# Patient Record
Sex: Male | Born: 1950
Health system: Southern US, Community
[De-identification: ages and names within clinical notes are randomized; demographics above are authoritative.]

## PROBLEM LIST (undated history)

## (undated) DIAGNOSIS — I10 Essential (primary) hypertension: Secondary | ICD-10-CM

## (undated) DIAGNOSIS — I639 Cerebral infarction, unspecified: Secondary | ICD-10-CM

## (undated) DIAGNOSIS — E119 Type 2 diabetes mellitus without complications: Secondary | ICD-10-CM

## (undated) HISTORY — PX: ANKLE FRACTURE SURGERY: SHX122

---

## 2009-10-05 ENCOUNTER — Emergency Department (HOSPITAL_COMMUNITY): Admission: EM | Admit: 2009-10-05 | Discharge: 2009-10-05 | Payer: Self-pay | Admitting: Family Medicine

## 2009-12-30 ENCOUNTER — Emergency Department (HOSPITAL_COMMUNITY): Admission: EM | Admit: 2009-12-30 | Discharge: 2009-12-30 | Payer: Self-pay | Admitting: Family Medicine

## 2010-06-16 LAB — POCT I-STAT, CHEM 8
BUN: 15 mg/dL (ref 6–23)
Calcium, Ion: 1.23 mmol/L (ref 1.12–1.32)
Chloride: 105 mEq/L (ref 96–112)
Creatinine, Ser: 1.3 mg/dL (ref 0.4–1.5)
Glucose, Bld: 116 mg/dL — ABNORMAL HIGH (ref 70–99)
HCT: 46 % (ref 39.0–52.0)
Hemoglobin: 15.6 g/dL (ref 13.0–17.0)
Potassium: 3.6 mEq/L (ref 3.5–5.1)
Sodium: 140 mEq/L (ref 135–145)
TCO2: 27 mmol/L (ref 0–100)

## 2011-12-30 ENCOUNTER — Emergency Department (HOSPITAL_COMMUNITY): Payer: Non-veteran care

## 2011-12-30 ENCOUNTER — Encounter (HOSPITAL_COMMUNITY): Payer: Self-pay | Admitting: Emergency Medicine

## 2011-12-30 ENCOUNTER — Emergency Department (HOSPITAL_COMMUNITY)
Admission: EM | Admit: 2011-12-30 | Discharge: 2011-12-30 | Disposition: A | Discharge status: Home / Self Care | Payer: Non-veteran care | Attending: Emergency Medicine | Admitting: Emergency Medicine

## 2011-12-30 DIAGNOSIS — G253 Myoclonus: Secondary | ICD-10-CM

## 2011-12-30 DIAGNOSIS — I10 Essential (primary) hypertension: Secondary | ICD-10-CM | POA: Insufficient documentation

## 2011-12-30 DIAGNOSIS — R279 Unspecified lack of coordination: Secondary | ICD-10-CM | POA: Insufficient documentation

## 2011-12-30 DIAGNOSIS — Z79899 Other long term (current) drug therapy: Secondary | ICD-10-CM | POA: Insufficient documentation

## 2011-12-30 HISTORY — DX: Essential (primary) hypertension: I10

## 2011-12-30 LAB — MAGNESIUM: Magnesium: 1.7 mg/dL (ref 1.5–2.5)

## 2011-12-30 LAB — BASIC METABOLIC PANEL
BUN: 10 mg/dL (ref 6–23)
CO2: 29 mEq/L (ref 19–32)
Calcium: 10.8 mg/dL — ABNORMAL HIGH (ref 8.4–10.5)
Chloride: 99 mEq/L (ref 96–112)
Creatinine, Ser: 0.76 mg/dL (ref 0.50–1.35)
GFR calc Af Amer: >90 mL/min (ref >90)
GFR calc non Af Amer: >90 mL/min (ref >90)
Glucose, Bld: 149 mg/dL — ABNORMAL HIGH (ref 70–99)
Potassium: 3.2 mEq/L — ABNORMAL LOW (ref 3.5–5.1)
Sodium: 138 mEq/L (ref 135–145)

## 2011-12-30 LAB — RAPID URINE DRUG SCREEN, HOSP PERFORMED
Amphetamines: NOT DETECTED
Barbiturates: NOT DETECTED
Benzodiazepines: NOT DETECTED
Cocaine: NOT DETECTED
Opiates: NOT DETECTED
Tetrahydrocannabinol: NOT DETECTED

## 2011-12-30 LAB — HEPATIC FUNCTION PANEL
ALT: 79 U/L — ABNORMAL HIGH (ref 0–53)
AST: 88 U/L — ABNORMAL HIGH (ref 0–37)
Albumin: 4.1 g/dL (ref 3.5–5.2)
Alkaline Phosphatase: 52 U/L (ref 39–117)
Bilirubin, Direct: 0.2 mg/dL (ref 0.0–0.3)
Indirect Bilirubin: 0.4 mg/dL (ref 0.3–0.9)
Total Bilirubin: 0.6 mg/dL (ref 0.3–1.2)
Total Protein: 8.4 g/dL — ABNORMAL HIGH (ref 6.0–8.3)

## 2011-12-30 LAB — ETHANOL: Alcohol, Ethyl (B): <11 mg/dL (ref 0–11)

## 2011-12-30 LAB — CBC
HCT: 39.3 % (ref 39.0–52.0)
Hemoglobin: 14 g/dL (ref 13.0–17.0)
MCH: 28.6 pg (ref 26.0–34.0)
MCHC: 35.6 g/dL (ref 30.0–36.0)
MCV: 80.4 fL (ref 78.0–100.0)
Platelets: 150 10*3/uL (ref 150–400)
RBC: 4.89 MIL/uL (ref 4.22–5.81)
RDW: 14.3 % (ref 11.5–15.5)
WBC: 8 10*3/uL (ref 4.0–10.5)

## 2011-12-30 LAB — TSH: TSH: 1.212 u[IU]/mL (ref 0.350–4.500)

## 2011-12-30 MED ORDER — SODIUM CHLORIDE 0.9 % IV BOLUS (SEPSIS)
1000.0000 mL | Freq: Once | INTRAVENOUS | Status: AC
  Administered 2011-12-30: 1000 mL via INTRAVENOUS

## 2011-12-30 MED ORDER — VALPROATE SODIUM 500 MG/5ML IV SOLN
500.0000 mg | INTRAVENOUS | Status: DC
  Filled 2011-12-30: qty 5

## 2011-12-30 MED ORDER — DIAZEPAM 5 MG/ML IJ SOLN
5.0000 mg | Freq: Once | INTRAMUSCULAR | Status: AC
  Administered 2011-12-30: 11:00:00 via INTRAVENOUS
  Filled 2011-12-30: qty 2

## 2011-12-30 MED ORDER — DIAZEPAM 5 MG/ML IJ SOLN
5.0000 mg | Freq: Once | INTRAMUSCULAR | Status: AC
  Administered 2011-12-30: 5 mg via INTRAVENOUS

## 2011-12-30 MED ORDER — POTASSIUM CHLORIDE CRYS ER 20 MEQ PO TBCR
40.0000 meq | EXTENDED_RELEASE_TABLET | Freq: Once | ORAL | Status: AC
  Administered 2011-12-30: 40 meq via ORAL
  Filled 2011-12-30: qty 2

## 2011-12-30 MED ORDER — CLONAZEPAM 0.5 MG PO TABS: 1.0000 mg | ORAL_TABLET | Freq: Three times a day (TID) | ORAL | Status: DC | PRN

## 2011-12-30 MED ORDER — DIPHENHYDRAMINE HCL 50 MG/ML IJ SOLN
25.0000 mg | Freq: Once | INTRAMUSCULAR | Status: AC
  Administered 2011-12-30: 25 mg via INTRAVENOUS
  Filled 2011-12-30: qty 1

## 2011-12-30 MED ORDER — CLONAZEPAM 0.5 MG PO TABS
2.0000 mg | ORAL_TABLET | Freq: Once | ORAL | Status: AC
  Administered 2011-12-30: 2 mg via ORAL
  Filled 2011-12-30: qty 4

## 2011-12-30 NOTE — ED Provider Notes (Signed)
History    61yM with intermittent, uncontrollable jerking. Onset last night. Persistent through out today. No hx of similar. Denies pain anywhere. Did drink liquor last night. Bought from liquor store and significant other with him drank it to and she has not had similar symptoms. No appreciable exacerbating or relieving factors. Denies any other ingestions. No new meds. Drinks regularly. No fever or chills. No acute change in visual acuity.  CSN: [id number]  Arrival date & time 12/30/11  3086   First MD Initiated Contact with Patient 12/30/11 1004      Chief Complaint  Patient presents with  . Shaking    (Consider location/radiation/quality/duration/timing/severity/associated sxs/prior treatment) HPI  Past Medical History  Diagnosis Date  . Hypertension     Past Surgical History  Procedure Date  . Ankle fracture surgery     No family history on file.  History  Substance Use Topics  . Smoking status: Never Smoker   . Smokeless tobacco: Not on file  . Alcohol Use: Yes     beer      Review of Systems   Review of symptoms negative unless otherwise noted in HPI.   Allergies  Latex  Home Medications   Current Outpatient Rx  Name Route Sig Dispense Refill  . AMLODIPINE BESYLATE 2.5 MG PO TABS Oral Take 2.5 mg by mouth daily.    Marland Kitchen HYDROCHLOROTHIAZIDE 25 MG PO TABS Oral Take 25 mg by mouth daily.    . ADULT MULTIVITAMIN W/MINERALS CH Oral Take 1 tablet by mouth daily.    Marland Kitchen VITAMIN B-12 250 MCG PO TABS Oral Take 750 mcg by mouth daily.      BP 163/117  Pulse 96  Temp 98.1 F (36.7 C) (Oral)  Resp 18  SpO2 96%  Physical Exam  Nursing note and vitals reviewed. Constitutional: He is oriented to person, place, and time. He appears well-developed and well-nourished. No distress.  HENT:  Head: Normocephalic and atraumatic.  Eyes: Conjunctivae normal and EOM are normal. Pupils are equal, round, and reactive to light. Right eye exhibits no discharge. Left eye  exhibits no discharge.  Neck: Normal range of motion. Neck supple.  Cardiovascular: Normal rate, regular rhythm and normal heart sounds.  Exam reveals no gallop and no friction rub.   No murmur heard. Pulmonary/Chest: Effort normal and breath sounds normal. No respiratory distress.  Abdominal: Soft. He exhibits no distension. There is no tenderness.  Musculoskeletal: He exhibits no edema and no tenderness.  Neurological: He is alert and oriented to person, place, and time. He has normal reflexes. No cranial nerve deficit. Coordination abnormal.       Occasionally jerking movement at rest. Worse with movement. B/l dysmetria with finger to nose. Gait unsteady.  Biceps and patellar reflexes 1+. Strength 5/5 b/l u/l extremities.  Skin: Skin is warm and dry.  Psychiatric: He has a normal mood and affect. His behavior is normal. Thought content normal.    ED Course  Procedures (including critical care time)  Labs Reviewed  BASIC METABOLIC PANEL - Abnormal; Notable for the following:    Potassium 3.2 (*)     Glucose, Bld 149 (*)     Calcium 10.8 (*)     All other components within normal limits  HEPATIC FUNCTION PANEL - Abnormal; Notable for the following:    Total Protein 8.4 (*)     AST 88 (*)     ALT 79 (*)     All other components within normal limits  MAGNESIUM  ETHANOL  CBC  TSH   Ct Head Wo Contrast  12/30/2011  *RADIOLOGY REPORT*  Clinical Data: Unusual alcohol intake yesterday, now with tremors.  CT HEAD WITHOUT CONTRAST  Technique:  Contiguous axial images were obtained from the base of the skull through the vertex without contrast.  Comparison: None.  Findings: There is no evidence for acute infarction, intracranial hemorrhage, mass lesion, hydrocephalus, or extra-axial fluid. Suspect mild cerebellar greater than cerebral atrophy.  Mild chronic microvascular ischemic change in the periventricular subcortical white matter.  Calvarium intact.  Mild vascular calcification.  No acute  sinus or mastoid disease.  IMPRESSION: Mild atrophy.  No acute intracranial findings.   Original Report Authenticated By: [name], M.D.      1. Myoclonic jerking       MDM  61 year old male with acute onset of myoclonic jerking. Patient has is at rest but is more pronounced with trying to maintain posture and with movements. No change in mental status. Consider metabolic, tox, infectious, stroke or other central lesion, epilepsy, hypoxic injury, etc. Given pt's history, metabolic or toxicologic etiology seems most likely. Will check labs. Possible imaging and/or neurology consult.  3:21 PM Pt evaluated by neurology. Recommending clonazepam and dose of benadryl. Pt symptoms significantly improved. DC'd with script for klonopin and neurology referral provided.        Raeford Razor, MD 12/30/11 [phone]

## 2011-12-30 NOTE — ED Notes (Addendum)
Pt reports drank corn liquor yesterday and began with shaking last night. Pt talking in complete sentences without difficulty. Pt reports feels nervous. Pt reports never drank corn liquor before only drinks beer every once and awhile.

## 2011-12-30 NOTE — ED Notes (Signed)
Dr. Kohut at pt bedside 

## 2011-12-30 NOTE — Consult Note (Signed)
Reason for Consult: Mycolonus Referring Physician: : Dr. Raeford Razor  - ED  CC:  Myoclonus HPI: This is a 61 y/o RHAAM with significant history of alcohol abuse, comes with acute onset of myoclonic activity without loss of consciousness, no seizure like activity, there is no cognitive decline and or change in mental status, no ataxia, no falls.  When distracted patient has no myoclonus, or sometimes minimal but when gets anxious gets worse. No family history of myoclonus, this has never occurred prior, no resting tremor, no other neurodegenerative disorders, no abnormal sleep cycle, has history of STD  About 30 years ago and was treated adequately, but do not recall the name of the organism or what kind of STD, no features of Visual hallucinations or audio hallucinations, no rigidity, no spasticity, its acute onset there is no childhood disorder, he is not on SSRI, or other medications. Patient is awake, alert and gives complete history, he does not get intermittently confused or disoriented. No neck titubation was noticed, no trauma, no history of thyroid disease, renal disease, or seizures. There is no change in the frequency or rhythm of myoclonic activity.  Past Medical History  Diagnosis Date  . Hypertension     Past Surgical History  Procedure Date  . Ankle fracture surgery     No family history on file.  Social History:  reports that he has never smoked. He does not have any smokeless tobacco history on file. He reports that he drinks alcohol. He reports that he does not use illicit drugs.  Allergies  Allergen Reactions  . Latex Itching    Medications: Scheduled:    ROS: All 12 point review of systems were reviewed and pertinent are  mentioned in the HPI   Physical Examination: Blood pressure 161/99, pulse 83, temperature 98.1 F (36.7 C), temperature source Oral, resp. rate 16, SpO2 96.00%.  Physical Exam: General: AAO follows commands HEENT: EOMI, No nystagmus, no  ptosis, no lid lag, no neglect, no convergence or divergence palsy NEck: Supple, no JVD, no carotid bruit Lungs: CTA Heart; RRR, no murmur, no rubs and or gallops Abdomen: bowel sounds present Skin; no rash Extremity: no edema   Neurologic Examination Mental Status: Alert, oriented, thought content appropriate.  Speech fluent without evidence of aphasia.  Able to follow 3 step commands without difficulty. Cranial Nerves: II: visual fields grossly normal, pupils equal, round, reactive to light and accommodation III,IV, VI: ptosis not present, extra-ocular motions intact bilaterally V,VII: smile symmetric, facial light touch sensation normal bilaterally VIII: hearing normal bilaterally IX,X: gag reflex present XI: trapezius strength/neck flexion strength normal bilaterally XII: tongue strength normal  Motor: Right : Upper extremity   5/5    Left:     Upper extremity   5/5  Lower extremity   5/5     Lower extremity   5/5 Tone and bulk:normal tone throughout; no atrophy noted Sensory: Pinprick and light touch intact throughout, bilaterally Deep Tendon Reflexes: 2+ and symmetric throughout Plantars: Right: downgoing   Left: downgoing Cerebellar:  because of myoclonus unable to do     Laboratory Studies:   Basic Metabolic Panel:  Lab 12/30/11 5784  NA 138  K 3.2*  CL 99  CO2 29  GLUCOSE 149*  BUN 10  CREATININE 0.76  CALCIUM 10.8*  MG 1.7  PHOS --    Liver Function Tests:  Lab 12/30/11 1032  AST 88*  ALT 79*  ALKPHOS 52  BILITOT 0.6  PROT 8.4*  ALBUMIN 4.1  No results found for this basename: LIPASE:5,AMYLASE:5 in the last 168 hours No results found for this basename: AMMONIA:3 in the last 168 hours  CBC:  Lab 12/30/11 1155  WBC 8.0  NEUTROABS --  HGB 14.0  HCT 39.3  MCV 80.4  PLT 150    Cardiac Enzymes: No results found for this basename: CKTOTAL:5,CKMB:5,CKMBINDEX:5,TROPONINI:5 in the last 168 hours  BNP: No components found with this  basename: POCBNP:5  CBG: No results found for this basename: GLUCAP:5 in the last 168 hours  Microbiology: No results found for this or any previous visit.  Coagulation Studies: No results found for this basename: LABPROT:5,INR:5 in the last 72 hours  Urinalysis: No results found for this basename: COLORURINE:2,APPERANCEUR:2,LABSPEC:2,PHURINE:2,GLUCOSEU:2,HGBUR:2,BILIRUBINUR:2,KETONESUR:2,PROTEINUR:2,UROBILINOGEN:2,NITRITE:2,LEUKOCYTESUR:2 in the last 168 hours  Lipid Panel:  No results found for this basename: chol, trig, hdl, cholhdl, vldl, ldlcalc    HgbA1C:  No results found for this basename: HGBA1C    Urine Drug Screen:   No results found for this basename: labopia, cocainscrnur, labbenz, amphetmu, thcu, labbarb    Alcohol Level:  Lab 12/30/11 1032  ETH <11    Other results: EKG: normal EKG, normal sinus rhythm, unchanged from previous tracings.  Imaging: Ct Head Wo Contrast  12/30/2011  *RADIOLOGY REPORT*  Clinical Data: Unusual alcohol intake yesterday, now with tremors.  CT HEAD WITHOUT CONTRAST  Technique:  Contiguous axial images were obtained from the base of the skull through the vertex without contrast.  Comparison: None.  Findings: There is no evidence for acute infarction, intracranial hemorrhage, mass lesion, hydrocephalus, or extra-axial fluid. Suspect mild cerebellar greater than cerebral atrophy.  Mild chronic microvascular ischemic change in the periventricular subcortical white matter.  Calvarium intact.  Mild vascular calcification.  No acute sinus or mastoid disease.  IMPRESSION: Mild atrophy.  No acute intracranial findings.   Original Report Authenticated By: [name], M.D.      Assessment/Plan:  Patient with alcohol abuse, had enough to drink last night , but urine screen this morning shows <11. It is difficult to differentiate organic vs physiological and or psychological myoclonic activity. This is less likely secondary to seizures. It may  hint more towards alcohol toxicity and or withdrawal  And or other metabolic disorders. Patient has no other neurological deficits.   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

## 2013-09-05 IMAGING — CT CT HEAD W/O CM
1 series · 16 of 30 positions shown, 20 images · non-contrast
Comparison: None.

CLINICAL DATA: Unusual alcohol intake yesterday, now with tremors.

CT HEAD WITHOUT CONTRAST
TECHNIQUE: Contiguous axial images were obtained from the base of
the skull through the vertex without contrast.

[Series 2: head routine 4.8 h37s · axial · 0.43mm/px · z∈[-156,+4]mm · 16 of 36 slices shown, 20 images]
[im 2/36  brain]
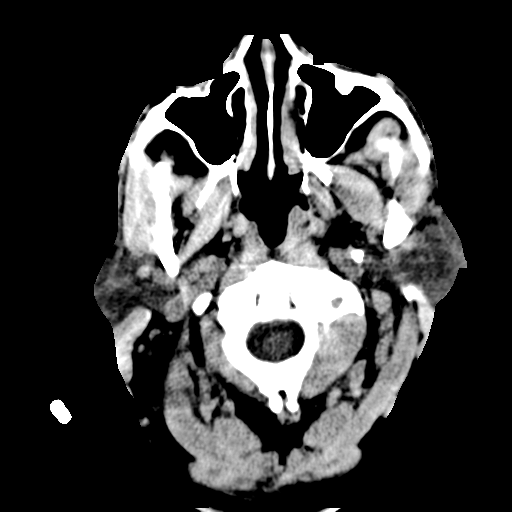
[im 2/36  bone]
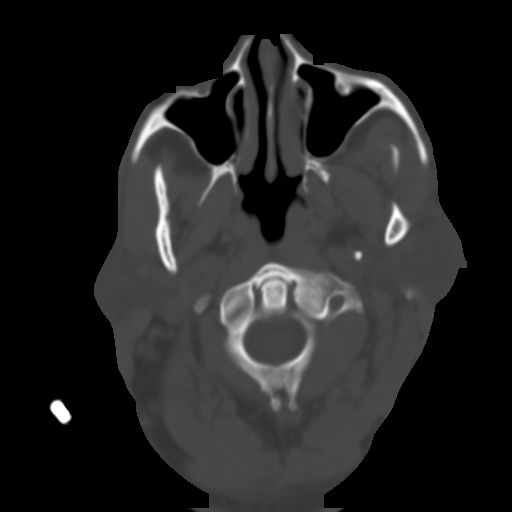
[im 4/36  brain]
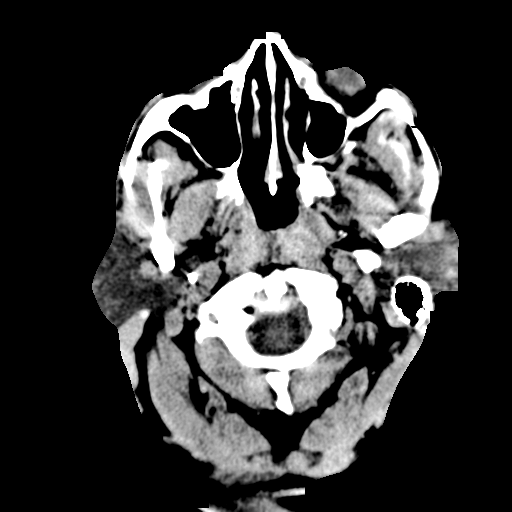
[im 7/36  brain]
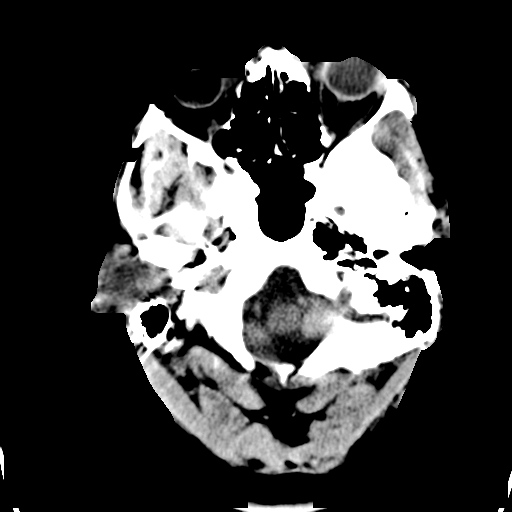
[im 9/36  brain]
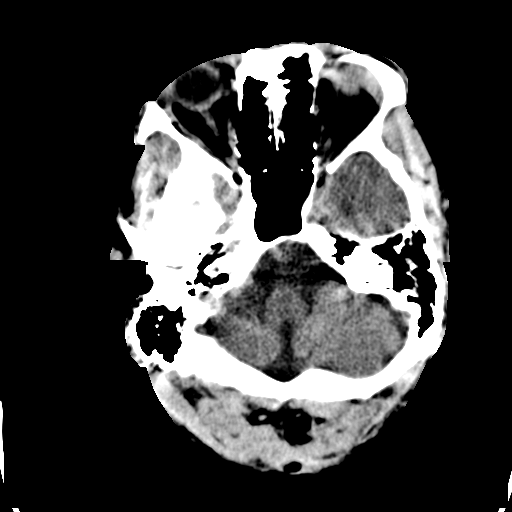
[im 10/36  brain]
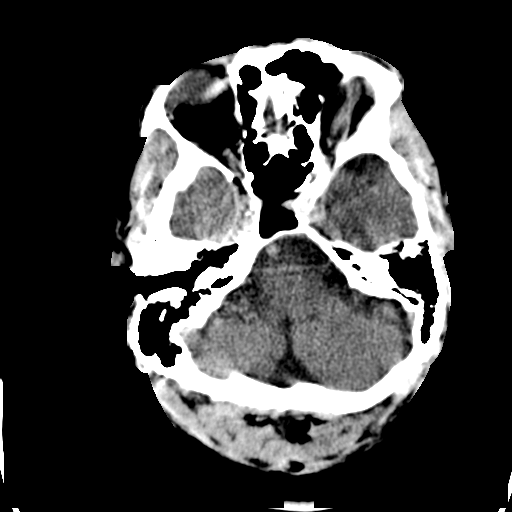
[im 10/36  bone]
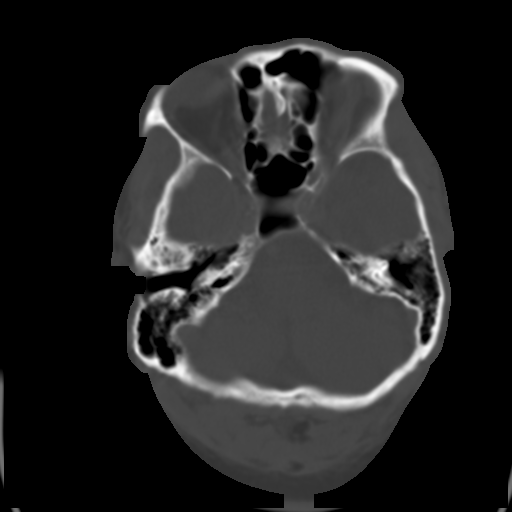
[im 13/36  brain]
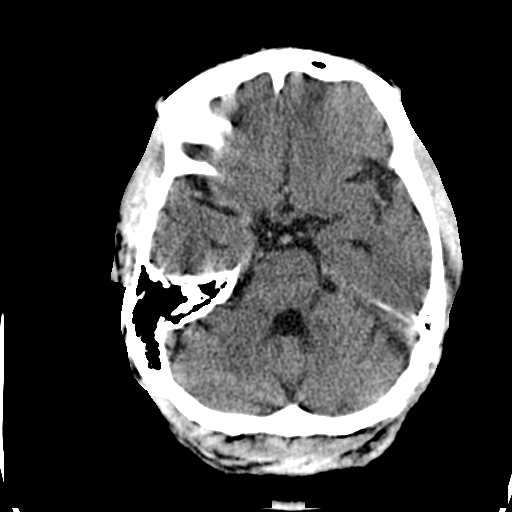
[im 15/36  brain]
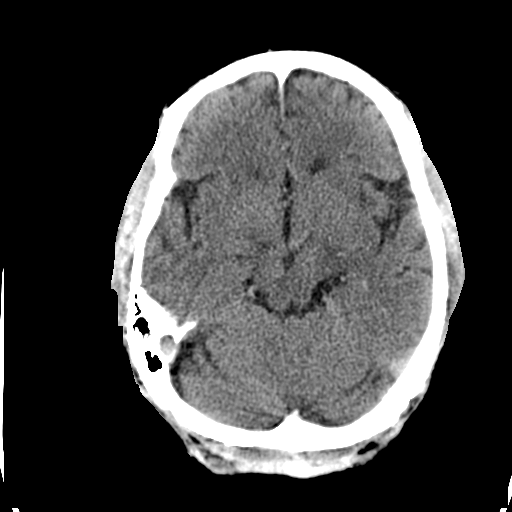
[im 17/36  brain]
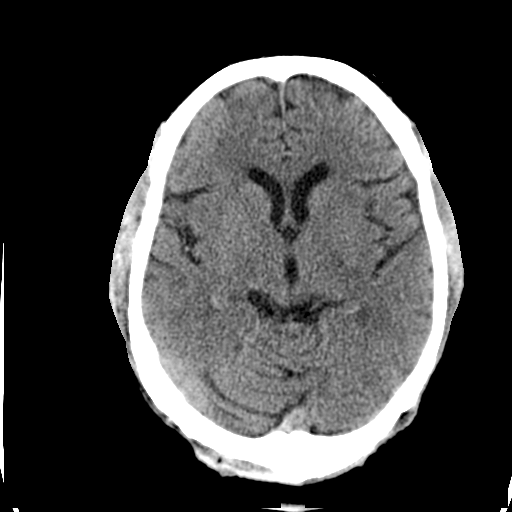
[im 19/36  brain]
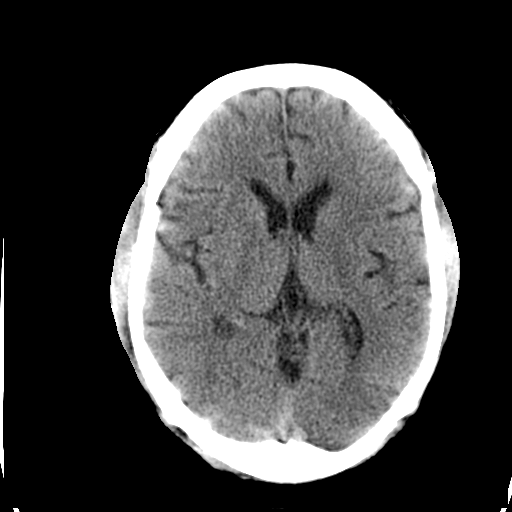
[im 19/36  bone]
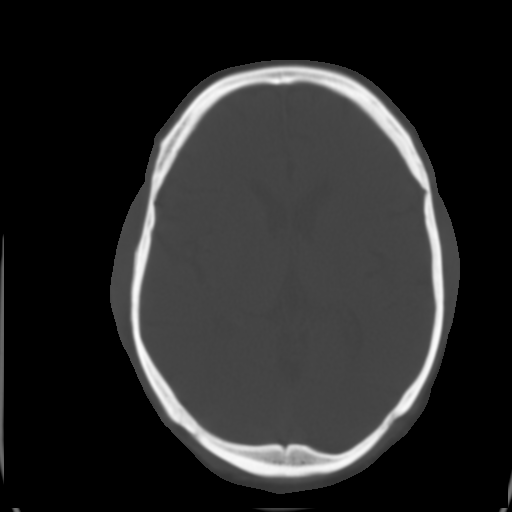
[im 21/36  brain]
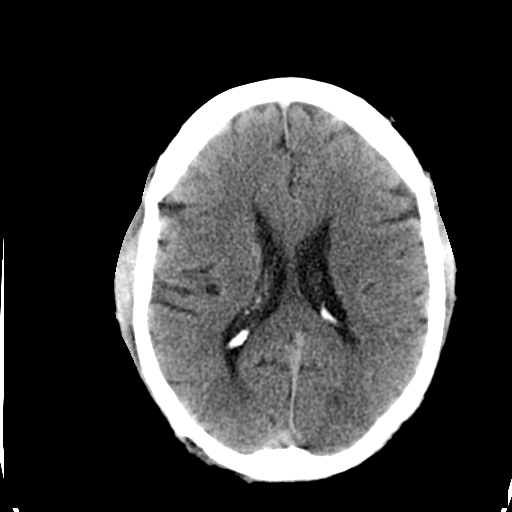
[im 23/36  brain]
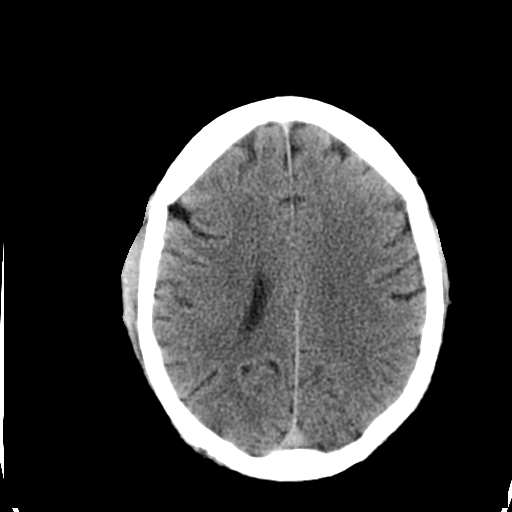
[im 26/36  brain]
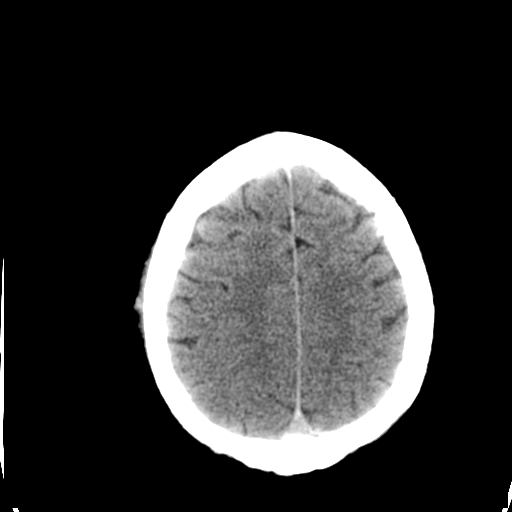
[im 27/36  brain]
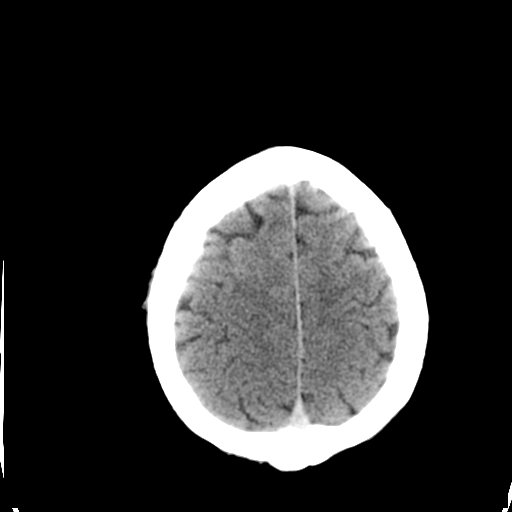
[im 27/36  bone]
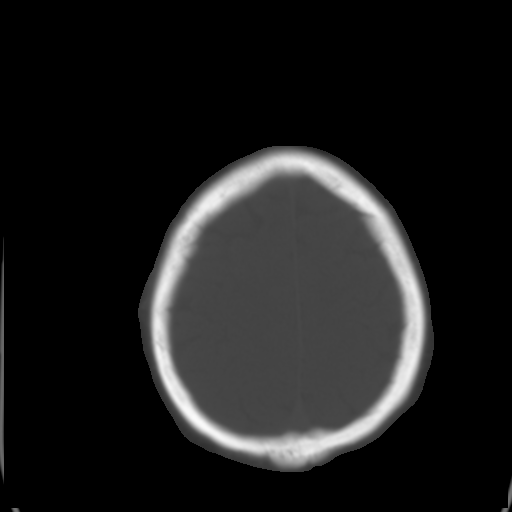
[im 29/36  brain]
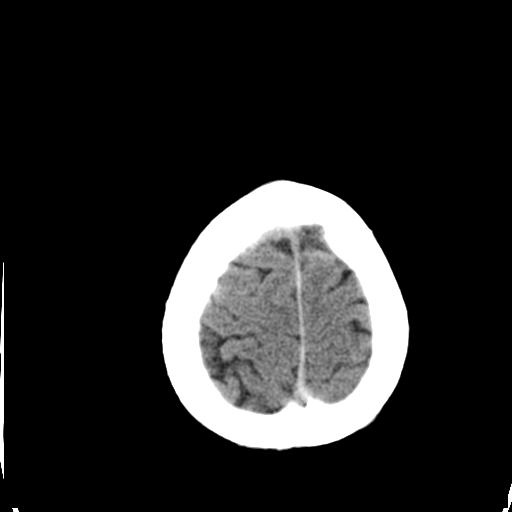
[im 32/36  brain]
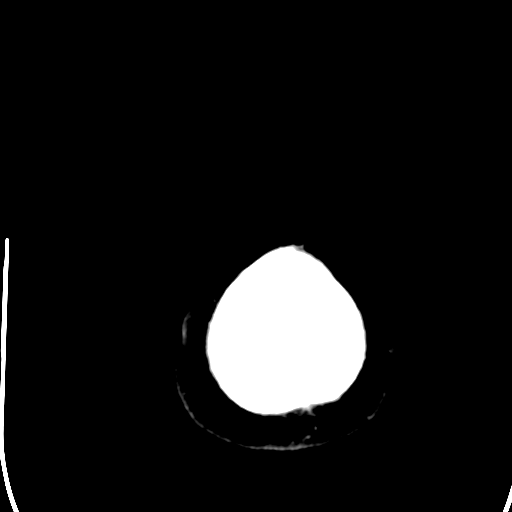
[im 34/36  brain]
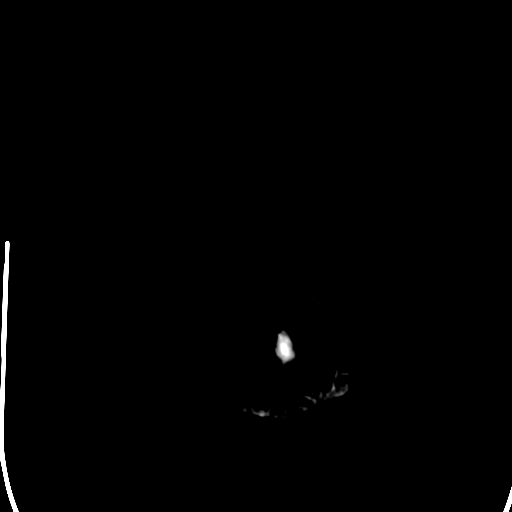

[16 of 30 positions shown; findings below may reference images not displayed]

FINDINGS: There is no evidence for acute infarction, intracranial
hemorrhage, mass lesion, hydrocephalus, or extra-axial fluid.
Suspect mild cerebellar greater than cerebral atrophy.  Mild
chronic microvascular ischemic change in the periventricular
subcortical white matter.  Calvarium intact.  Mild vascular
calcification.  No acute sinus or mastoid disease.
IMPRESSION: Mild atrophy.  No acute intracranial findings.

## 2017-04-06 ENCOUNTER — Encounter: Payer: Self-pay | Admitting: Internal Medicine

## 2017-10-03 DIAGNOSIS — Z0100 Encounter for examination of eyes and vision without abnormal findings: Secondary | ICD-10-CM | POA: Diagnosis not present

## 2017-10-03 DIAGNOSIS — H524 Presbyopia: Secondary | ICD-10-CM | POA: Diagnosis not present

## 2017-11-16 ENCOUNTER — Ambulatory Visit: Payer: Non-veteran care | Admitting: Family Medicine

## 2017-11-23 ENCOUNTER — Ambulatory Visit: Payer: Medicare HMO | Attending: Internal Medicine | Admitting: Internal Medicine

## 2017-11-23 ENCOUNTER — Encounter: Payer: Self-pay | Admitting: Internal Medicine

## 2017-11-23 VITALS — BP 157/91 | HR 77 | Temp 98.2°F | Resp 16 | Ht 72.0 in | Wt 343.0 lb

## 2017-11-23 DIAGNOSIS — M1711 Unilateral primary osteoarthritis, right knee: Secondary | ICD-10-CM

## 2017-11-23 DIAGNOSIS — Z6841 Body Mass Index (BMI) 40.0 and over, adult: Secondary | ICD-10-CM

## 2017-11-23 DIAGNOSIS — H259 Unspecified age-related cataract: Secondary | ICD-10-CM | POA: Diagnosis not present

## 2017-11-23 DIAGNOSIS — B1920 Unspecified viral hepatitis C without hepatic coma: Secondary | ICD-10-CM | POA: Diagnosis not present

## 2017-11-23 DIAGNOSIS — E119 Type 2 diabetes mellitus without complications: Secondary | ICD-10-CM

## 2017-11-23 DIAGNOSIS — I10 Essential (primary) hypertension: Secondary | ICD-10-CM | POA: Diagnosis not present

## 2017-11-23 DIAGNOSIS — F1190 Opioid use, unspecified, uncomplicated: Secondary | ICD-10-CM

## 2017-11-23 DIAGNOSIS — Z79899 Other long term (current) drug therapy: Secondary | ICD-10-CM | POA: Insufficient documentation

## 2017-11-23 DIAGNOSIS — Z8619 Personal history of other infectious and parasitic diseases: Secondary | ICD-10-CM | POA: Diagnosis not present

## 2017-11-23 DIAGNOSIS — Z23 Encounter for immunization: Secondary | ICD-10-CM | POA: Diagnosis not present

## 2017-11-23 DIAGNOSIS — Z7984 Long term (current) use of oral hypoglycemic drugs: Secondary | ICD-10-CM | POA: Insufficient documentation

## 2017-11-23 DIAGNOSIS — Z125 Encounter for screening for malignant neoplasm of prostate: Secondary | ICD-10-CM | POA: Diagnosis not present

## 2017-11-23 DIAGNOSIS — Z79891 Long term (current) use of opiate analgesic: Secondary | ICD-10-CM | POA: Insufficient documentation

## 2017-11-23 LAB — POCT GLYCOSYLATED HEMOGLOBIN (HGB A1C): HbA1c, POC (controlled diabetic range): 7 % (ref 0.0–7.0)

## 2017-11-23 LAB — GLUCOSE, POCT (MANUAL RESULT ENTRY): POC Glucose: 189 mg/dl — AB (ref 70–99)

## 2017-11-23 MED ORDER — TRAMADOL HCL 50 MG PO TABS: 50.0000 mg | ORAL_TABLET | Freq: Two times a day (BID) | ORAL | 0 refills | Status: DC | PRN

## 2017-11-23 MED ORDER — DICLOFENAC SODIUM 1 % TD GEL: 2.0000 g | Freq: Four times a day (QID) | TRANSDERMAL | 3 refills | Status: DC

## 2017-11-23 MED ORDER — LOSARTAN POTASSIUM 100 MG PO TABS: 100.0000 mg | ORAL_TABLET | Freq: Every day | ORAL | 1 refills | Status: DC

## 2017-11-23 MED ORDER — HYDRALAZINE HCL 10 MG PO TABS: 10.0000 mg | ORAL_TABLET | Freq: Two times a day (BID) | ORAL | 1 refills | Status: DC

## 2017-11-23 MED ORDER — AMLODIPINE BESYLATE 10 MG PO TABS: 10.0000 mg | ORAL_TABLET | Freq: Every day | ORAL | 1 refills | Status: DC

## 2017-11-23 MED ORDER — METFORMIN HCL 1000 MG PO TABS: 1000.0000 mg | ORAL_TABLET | Freq: Two times a day (BID) | ORAL | 1 refills | Status: DC

## 2017-11-23 NOTE — Patient Instructions (Addendum)
Please sign a release at the checkout counter for [place] to get your records from the [place].  Your blood pressure is not at goal of 130/80 or lower.  I have recommended adding a medication called hydralazine 10 mg twice a day.  Continue amlodipine, Losartan and chlorthalidone.  Start using the Voltaren gel on your knee to help with arthritis pain.  This is an anti-inflammatory gel. We have decreased tramadol to twice a day as needed.   Influenza Virus Vaccine injection (Fluarix) What is this medicine? INFLUENZA VIRUS VACCINE (in floo EN zuh VAHY ruhs vak SEEN) helps to reduce the risk of getting influenza also known as the flu. This medicine may be used for other purposes; ask your health care provider or pharmacist if you have questions. COMMON BRAND NAME(S): Fluarix, Fluzone What should I tell my health care provider before I take this medicine? They need to know if you have any of these conditions: -bleeding disorder like hemophilia -fever or infection -Guillain-Barre syndrome or other neurological problems -immune system problems -infection with the human immunodeficiency virus (HIV) or AIDS -low blood platelet counts -multiple sclerosis -an unusual or allergic reaction to influenza virus vaccine, eggs, chicken proteins, latex, gentamicin, other medicines, foods, dyes or preservatives -pregnant or trying to get pregnant -breast-feeding How should I use this medicine? This vaccine is for injection into a muscle. It is given by a health care professional. A copy of Vaccine Information Statements will be given before each vaccination. Read this sheet carefully each time. The sheet may change frequently. Talk to your pediatrician regarding the use of this medicine in children. Special care may be needed. Overdosage: If you think you have taken too much of this medicine contact a poison control center or emergency room at once. NOTE: This medicine is only for you. Do not share this medicine  with others. What if I miss a dose? This does not apply. What may interact with this medicine? -chemotherapy or radiation therapy -medicines that lower your immune system like etanercept, anakinra, infliximab, and adalimumab -medicines that treat or prevent blood clots like warfarin -phenytoin -steroid medicines like prednisone or cortisone -theophylline -vaccines This list may not describe all possible interactions. Give your health care provider a list of all the medicines, herbs, non-prescription drugs, or dietary supplements you use. Also tell them if you smoke, drink alcohol, or use illegal drugs. Some items may interact with your medicine. What should I watch for while using this medicine? Report any side effects that do not go away within 3 days to your doctor or health care professional. Call your health care provider if any unusual symptoms occur within 6 weeks of receiving this vaccine. You may still catch the flu, but the illness is not usually as bad. You cannot get the flu from the vaccine. The vaccine will not protect against colds or other illnesses that may cause fever. The vaccine is needed every year. What side effects may I notice from receiving this medicine? Side effects that you should report to your doctor or health care professional as soon as possible: -allergic reactions like skin rash, itching or hives, swelling of the face, lips, or tongue Side effects that usually do not require medical attention (report to your doctor or health care professional if they continue or are bothersome): -fever -headache -muscle aches and pains -pain, tenderness, redness, or swelling at site where injected -weak or tired This list may not describe all possible side effects. Call your doctor for medical advice  about side effects. You may report side effects to FDA at 1-800-FDA-1088. Where should I keep my medicine? This vaccine is only given in a clinic, pharmacy, doctor's office, or  other health care setting and will not be stored at home. NOTE: This sheet is a summary. It may not cover all possible information. If you have questions about this medicine, talk to your doctor, pharmacist, or health care provider.  2018 [employer]/Gold Standard (2007-10-16 09:30:40)

## 2017-11-23 NOTE — Progress Notes (Signed)
Patient ID: Austin Mercer, male    DOB: 12/31/1950  MRN: [id number]  CC: New Patient (Initial Visit)   Subjective: Austin Mercer is a 67 y.o. male who presents for new pt visit. Previous PCP was at the [facility].  Last seen last mth His concerns today include:   Pt gives hx of severe RT OA of knee, lower back pain x 1 yr (had x-ray that showed arthritis), HTN, DM, hep C (treated at [facility], cured), ED.  DM:  Needs test stripes and lancets Taking Metformin consistently Eating Habits: "I have slowed down on eating."  He tries to walk QOD if he goes to the store.  Has a peddling machine which he plans to start using.   Last eye exam was 1 mth ago. Cataract on Rt eye No numbness in feet   HTN:  Reports compliance with meds and salt restriction.  Current meds include amlodipine, chlorthalidone and Cozaar.  He has taken medicines already for today.  Denies any chest pains or shortness of breath at rest on exertion.  Rt knee pain:  Takes Tramadol TID; knee bothersome with walking and in rainy weather.  When he runs out of tramadol, he takes Aleve and feels that this works better for him.  He is not sure if he has any kidney dysfunction.  Drinks about 5 12 oz beer a wk and shot of Christiane HaJack Daniels about 3 times a week  Current Outpatient Medications on File Prior to Visit  Medication Sig Dispense Refill  . amLODipine (NORVASC) 10 MG tablet Take 10 mg by mouth daily.    . chlorthalidone (HYGROTON) 50 MG tablet Take by mouth daily.    Marland Kitchen. losartan (COZAAR) 100 MG tablet Take 100 mg by mouth daily.    . metFORMIN (GLUCOPHAGE) 1000 MG tablet Take 1,000 mg by mouth 2 (two) times daily with a meal.    . tadalafil (CIALIS) 5 MG tablet Take 5 mg by mouth daily as needed for erectile dysfunction.    . traMADol (ULTRAM) 50 MG tablet Take by mouth 3 (three) times daily.    . clonazePAM (KLONOPIN) 0.5 MG tablet Take 2 tablets (1 mg total) by mouth 3 (three) times daily as needed (myclonic jerking). (Patient not taking:  Reported on 11/23/2017) 30 tablet 0  . Multiple Vitamin (MULTIVITAMIN WITH MINERALS) TABS Take 1 tablet by mouth daily.    . vitamin B-12 (CYANOCOBALAMIN) 250 MCG tablet Take 750 mcg by mouth daily.     No current facility-administered medications on file prior to visit.     Allergies  Allergen Reactions  . Latex Itching    Social History   Socioeconomic History  . Marital status: Divorced    Spouse name: Not on file  . Number of children: Not on file  . Years of education: Not on file  . Highest education level: Not on file  Occupational History  . Not on file  Social Needs  . Financial resource strain: Not on file  . Food insecurity:    Worry: Not on file    Inability: Not on file  . Transportation needs:    Medical: Not on file    Non-medical: Not on file  Tobacco Use  . Smoking status: Never Smoker  Substance and Sexual Activity  . Alcohol use: Yes    Comment: beer  . Drug use: No  . Sexual activity: Not on file  Lifestyle  . Physical activity:    Days per week: Not on file  Minutes per session: Not on file  . Stress: Not on file  Relationships  . Social connections:    Talks on phone: Not on file    Gets together: Not on file    Attends religious service: Not on file    Active member of club or organization: Not on file    Attends meetings of clubs or organizations: Not on file    Relationship status: Not on file  . Intimate partner violence:    Fear of current or ex partner: Not on file    Emotionally abused: Not on file    Physically abused: Not on file    Forced sexual activity: Not on file  Other Topics Concern  . Not on file  Social History Narrative  . Not on file    No family history on file.  Past Surgical History:  Procedure Laterality Date  . ANKLE FRACTURE SURGERY      ROS: Review of Systems  Constitutional: Negative for activity change and appetite change.  Eyes: Negative for visual disturbance.  Respiratory: Negative for chest  tightness and shortness of breath.   Cardiovascular: Negative for chest pain, palpitations and leg swelling.  Endocrine: Negative for polydipsia and polyuria.    PHYSICAL EXAM: BP (!) 157/91   Pulse 77   Temp 98.2 F (36.8 C) (Oral)   Resp 16   Ht 6' (1.829 m)   Wt (!) 343 lb (155.6 kg)   SpO2 97%   BMI 46.52 kg/m   Physical Exam  General appearance - alert, well appearing, older African-American male and in no distress Mental status -patient is very talkative.  He answers questions appropriately.  He swears constantly. Eyes - pupils equal and reactive, extraocular eye movements intact Nose - normal and patent, no erythema, discharge or polyps Mouth - mucous membranes moist, pharynx normal without lesions Neck - supple, no significant adenopathy Chest - clear to auscultation, no wheezes, rales or rhonchi, symmetric air entry Heart - normal rate, regular rhythm, normal S1, S2, no murmurs, rubs, clicks or gallops Breasts: Mild to moderate gynecomastia.  No lumps palpated in the breast. Abdomen -obese, normal bowel sounds, soft nontender and no organomegaly. Musculoskeletal -right knee: Large body habitus.  Mild joint enlargement.  No point tenderness.  Mild crepitus on passive movement.   Extremities - peripheral pulses normal, no pedal edema, no clubbing or cyanosis  BS 189/A1C 7.0 ASSESSMENT AND PLAN: 1. Controlled type 2 diabetes mellitus without complication, without long-term current use of insulin (HCC) Patient close to goal of having A1c less than 7. Discussed the importance of healthy eating habits, regular aerobic exercise (at least 150 minutes a week as tolerated) and medication compliance to achieve or maintain control of diabetes. Continue current dose of metformin. Advised to cut back on sugary drinks - POCT glucose (manual entry) - POCT glycosylated hemoglobin (Hb A1C) - CBC - Comprehensive metabolic panel - Lipid panel  2. Essential hypertension Not at goal.   Add low-dose hydralazine twice a day - hydrALAZINE (APRESOLINE) 10 MG tablet; Take 1 tablet (10 mg total) by mouth 2 (two) times daily.  Dispense: 180 tablet; Refill: 1 - amLODipine (NORVASC) 10 MG tablet; Take 1 tablet (10 mg total) by mouth daily.  Dispense: 90 tablet; Refill: 1 - losartan (COZAAR) 100 MG tablet; Take 1 tablet (100 mg total) by mouth daily.  Dispense: 90 tablet; Refill: 1  3. Primary osteoarthritis of right knee Encourage weight loss.  Check kidney function today to see whether  use of a Cox 2 inhibitor may be a possibility.  In the meantime I have added Voltaren gel.  We will cut back on the tramadol to twice a day dosing. NCCSRS reviewed and is appropriate.  Last tramadol refill was August 1. -We went over controlled substance prescribing agreement with him today. He was agreeable to urine drug screen but was unable to produce urine so agreed for blood draw - diclofenac sodium (VOLTAREN) 1 % GEL; Apply 2 g topically 4 (four) times daily.  Dispense: 100 g; Refill: 3 - traMADol (ULTRAM) 50 MG tablet; Take 1 tablet (50 mg total) by mouth 2 (two) times daily as needed. To fill on or after 12/01/2017  Dispense: 60 tablet; Refill: 0 - traMADol (ULTRAM) 50 MG tablet; Take 1 tablet (50 mg total) by mouth 2 (two) times daily as needed. To fill on  Or after 12/31/2017  Dispense: 60 tablet; Refill: 0  4. Class 3 severe obesity due to excess calories with serious comorbidity and body mass index (BMI) of 45.0 to 49.9 in adult [facility]) See #1 above  5. Senile cataract of right eye, unspecified age-related cataract type   6. Hepatitis C virus infection cured after antiviral drug therapy - HCV RNA quant  7. Need for influenza vaccination - Flu Vaccine QUAD 6+ mos PF IM (Fluarix Quad PF)  8. Chronic, continuous use of opioids Patient was unable to give a urine sample for urine drug screen but he was agreeable to having it done as a blood draw- Drug Screen 13 w/Conf, WB  9. Prostate cancer  screening Patient was agreeable to prostate cancer screening - PSA  - metFORMIN (GLUCOPHAGE) 1000 MG tablet; Take 1 tablet (1,000 mg total) by mouth 2 (two) times daily with a meal.  Dispense: 180 tablet; Refill: 1   Patient was given the opportunity to ask questions.  Patient verbalized understanding of the plan and was able to repeat key elements of the plan.   No orders of the defined types were placed in this encounter.    Requested Prescriptions    No prescriptions requested or ordered in this encounter    No follow-ups on file.  Jonah Blue, MD, FACP

## 2017-11-23 NOTE — Progress Notes (Signed)
cbg 189 a1c 7.0

## 2017-11-25 ENCOUNTER — Other Ambulatory Visit: Payer: Self-pay | Admitting: Internal Medicine

## 2017-11-25 MED ORDER — ATORVASTATIN CALCIUM 10 MG PO TABS: 10.0000 mg | ORAL_TABLET | Freq: Every day | ORAL | 3 refills | Status: DC

## 2017-11-25 MED ORDER — POTASSIUM CHLORIDE ER 8 MEQ PO TBCR: 16.0000 meq | EXTENDED_RELEASE_TABLET | Freq: Every day | ORAL | 4 refills | Status: DC

## 2017-11-26 ENCOUNTER — Telehealth: Payer: Self-pay

## 2017-11-26 MED FILL — ATORVASTATIN 10 MG TABLET: 10 | 30 days supply | Qty: 30 | Fill #0

## 2017-11-26 MED FILL — POTASSIUM CL ER 8 MEQ CAP: 8 | 30 days supply | Qty: 60 | Fill #0

## 2017-11-26 NOTE — Telephone Encounter (Signed)
Contacted pt to go over lab results pt is aware and doesn't have any questions or concerns 

## 2017-11-27 ENCOUNTER — Ambulatory Visit: Payer: Medicare HMO | Attending: Family Medicine

## 2017-11-27 NOTE — Progress Notes (Signed)
Patient here for lab visit only 

## 2017-11-28 ENCOUNTER — Other Ambulatory Visit: Payer: Self-pay | Admitting: Internal Medicine

## 2017-11-28 LAB — PTH, INTACT AND CALCIUM
Calcium: 11.5 mg/dL — ABNORMAL HIGH (ref 8.6–10.2)
PTH: 27 pg/mL (ref 15–65)

## 2017-11-28 NOTE — Addendum Note (Signed)
Addended by: Paschal DoppWHITE, VANESSA J on: 11/28/2017 11:03 AM   Modules accepted: Orders

## 2017-11-29 LAB — VITAMIN D 25 HYDROXY (VIT D DEFICIENCY, FRACTURES): Vit D, 25-Hydroxy: 28.4 ng/mL — ABNORMAL LOW (ref 30.0–100.0)

## 2017-11-29 LAB — SPECIMEN STATUS REPORT

## 2017-12-02 LAB — TSH: TSH: 3.06 u[IU]/mL (ref 0.450–4.500)

## 2017-12-02 LAB — SPECIMEN STATUS REPORT

## 2017-12-04 ENCOUNTER — Other Ambulatory Visit: Payer: Self-pay | Admitting: Internal Medicine

## 2017-12-04 ENCOUNTER — Telehealth: Payer: Self-pay

## 2017-12-04 MED FILL — traMADol HCL 50 MG TABS: 50 | 30 days supply | Qty: 60 | Fill #0

## 2017-12-04 NOTE — Telephone Encounter (Signed)
Contacted pt to go over lab results pt is aware of results  Pt is schedule to come to the lab Sept 4, 2019 @130pm    Dr. Laural Benes [facility] I have been put on hold for 30 mins I have been hunged up on. I am not able to get anyone the line regarding pt calcium level. I will try again tomorrow

## 2017-12-05 ENCOUNTER — Ambulatory Visit: Payer: Medicare HMO | Attending: Family Medicine

## 2017-12-05 NOTE — Progress Notes (Signed)
Patient here for lab visit only 

## 2017-12-07 LAB — COMPREHENSIVE METABOLIC PANEL
ALT: 26 IU/L (ref 0–44)
AST: 22 IU/L (ref 0–40)
Albumin/Globulin Ratio: 1.5 (ref 1.2–2.2)
Albumin: 4.6 g/dL (ref 3.6–4.8)
Alkaline Phosphatase: 47 IU/L (ref 39–117)
BUN/Creatinine Ratio: 12 (ref 10–24)
BUN: 13 mg/dL (ref 8–27)
Bilirubin Total: 0.3 mg/dL (ref 0.0–1.2)
CO2: 23 mmol/L (ref 20–29)
Calcium: 11.6 mg/dL — ABNORMAL HIGH (ref 8.6–10.2)
Chloride: 98 mmol/L (ref 96–106)
Creatinine, Ser: 1.05 mg/dL (ref 0.76–1.27)
GFR calc Af Amer: 86 mL/min/{1.73_m2} (ref >59)
GFR calc non Af Amer: 74 mL/min/{1.73_m2} (ref >59)
Globulin, Total: 3.1 g/dL (ref 1.5–4.5)
Glucose: 151 mg/dL — ABNORMAL HIGH (ref 65–99)
Potassium: 3.3 mmol/L — ABNORMAL LOW (ref 3.5–5.2)
Sodium: 135 mmol/L (ref 134–144)
Total Protein: 7.7 g/dL (ref 6.0–8.5)

## 2017-12-07 LAB — CBC
Hematocrit: 41 % (ref 37.5–51.0)
Hemoglobin: 13.9 g/dL (ref 13.0–17.7)
MCH: 26.8 pg (ref 26.6–33.0)
MCHC: 33.9 g/dL (ref 31.5–35.7)
MCV: 79 fL (ref 79–97)
Platelets: 181 10*3/uL (ref 150–450)
RBC: 5.19 x10E6/uL (ref 4.14–5.80)
RDW: 16.2 % — ABNORMAL HIGH (ref 12.3–15.4)
WBC: 9.9 10*3/uL (ref 3.4–10.8)

## 2017-12-07 LAB — HCV RNA QUANT: Hepatitis C Quantitation: NOT DETECTED IU/mL

## 2017-12-07 LAB — FENTANYL,MS,WB/SP RFX
Fentanyl Confirmation: NEGATIVE
Fentanyl: NEGATIVE ng/mL
Norfentanyl: NEGATIVE ng/mL

## 2017-12-07 LAB — TRAMADOL,MS,WB/SP RFX
O-Desmethyltramadol: 91.2 ng/mL
Tramadol Confirmation: POSITIVE
Tramadol: 236.7 ng/mL

## 2017-12-07 LAB — PSA: Prostate Specific Ag, Serum: 0.7 ng/mL (ref 0.0–4.0)

## 2017-12-07 LAB — DRUG SCREEN 13 W/CONF, WB
Amphetamines, IA: NEGATIVE ng/mL
Barbiturates, IA: NEGATIVE ug/mL
Benzodiazepines, IA: NEGATIVE ng/mL
Cocaine/Metabolite, IA: NEGATIVE ng/mL
Fentanyl, IA: NEGATIVE ng/mL
Meperidine, IA: NEGATIVE ng/mL
Methadone, IA: NEGATIVE ng/mL
Opiates, IA: NEGATIVE ng/mL
Oxycodones, IA: NEGATIVE ng/mL
Phencyclidine, IA: NEGATIVE ng/mL
Propoxyphene, IA: NEGATIVE ng/mL
THC (Marijuana) Mtb, IA: NEGATIVE ng/mL
Tramadol, IA: POSITIVE ng/mL — AB

## 2017-12-07 LAB — LIPID PANEL
Chol/HDL Ratio: 3.9 ratio (ref 0.0–5.0)
Cholesterol, Total: 166 mg/dL (ref 100–199)
HDL: 43 mg/dL (ref >39)
LDL Calculated: 93 mg/dL (ref 0–99)
Triglycerides: 148 mg/dL (ref 0–149)
VLDL Cholesterol Cal: 30 mg/dL (ref 5–40)

## 2017-12-10 LAB — PROTEIN ELECTROPHORESIS, SERUM, WITH REFLEX
A/G Ratio: 1 (ref 0.7–1.7)
Albumin ELP: 3.8 g/dL (ref 2.9–4.4)
Alpha 1: 0.2 g/dL (ref 0.0–0.4)
Alpha 2: 0.7 g/dL (ref 0.4–1.0)
Beta: 1.5 g/dL — ABNORMAL HIGH (ref 0.7–1.3)
Gamma Globulin: 1.5 g/dL (ref 0.4–1.8)
Globulin, Total: 3.9 g/dL (ref 2.2–3.9)
Total Protein: 7.7 g/dL (ref 6.0–8.5)

## 2017-12-10 LAB — PTH-RELATED PEPTIDE: PTH-related peptide: <2 pmol/L

## 2018-01-13 ENCOUNTER — Telehealth: Payer: Self-pay | Admitting: Internal Medicine

## 2018-01-15 NOTE — Telephone Encounter (Signed)
[facility] and unfortunately we are unable to get the last blood work results because of HIPPA. I even asked if I can just get the last Calcium level that they drew and they said no. So I will have to wait till patient comes into the office to have pt sign a medical release form to get those results and have to put STAT on form.   Contacted pt again lvm on house and mobile number

## 2018-01-18 ENCOUNTER — Other Ambulatory Visit: Payer: Self-pay

## 2018-01-18 DIAGNOSIS — M1711 Unilateral primary osteoarthritis, right knee: Secondary | ICD-10-CM

## 2018-01-18 NOTE — Telephone Encounter (Signed)
Pt last seen: 11/23/17 Next appt: 01/24/18 Last RX written on: 11/23/17 Date of original fill: 12/04/17 Date of refill(s): second Rx written on 11/23/17 filled on 12/31/17  No other controlled substances were filled during this time, please refill if appropriate.   This refill is early but I believe the patient is just trying to get this switched to mail order before he runs out, Francine Graven tends to give $0 copays when using mail order.   Next refill due: 01/30/18, could be refilled as soon as 01/28/18

## 2018-01-21 MED ORDER — TRAMADOL HCL 50 MG PO TABS: 50.0000 mg | ORAL_TABLET | Freq: Two times a day (BID) | ORAL | 1 refills | Status: DC | PRN

## 2018-01-21 NOTE — Telephone Encounter (Signed)
Pt is aware.  

## 2018-01-21 NOTE — Telephone Encounter (Signed)
[place] controlled substance reporting system reviewed and is appropriate.  Tramadol refill 50 mg twice daily as needed with 1 refill.

## 2018-01-24 ENCOUNTER — Ambulatory Visit: Payer: Medicare HMO | Attending: Internal Medicine | Admitting: Internal Medicine

## 2018-01-24 ENCOUNTER — Other Ambulatory Visit: Payer: Self-pay | Admitting: Internal Medicine

## 2018-01-24 VITALS — BP 183/94 | HR 69 | Temp 98.2°F | Resp 16 | Wt 343.2 lb

## 2018-01-24 DIAGNOSIS — Z6841 Body Mass Index (BMI) 40.0 and over, adult: Secondary | ICD-10-CM | POA: Insufficient documentation

## 2018-01-24 DIAGNOSIS — M4690 Unspecified inflammatory spondylopathy, site unspecified: Secondary | ICD-10-CM | POA: Insufficient documentation

## 2018-01-24 DIAGNOSIS — M1711 Unilateral primary osteoarthritis, right knee: Secondary | ICD-10-CM | POA: Diagnosis not present

## 2018-01-24 DIAGNOSIS — Z8619 Personal history of other infectious and parasitic diseases: Secondary | ICD-10-CM | POA: Insufficient documentation

## 2018-01-24 DIAGNOSIS — Z79899 Other long term (current) drug therapy: Secondary | ICD-10-CM | POA: Insufficient documentation

## 2018-01-24 DIAGNOSIS — Z91040 Latex allergy status: Secondary | ICD-10-CM | POA: Insufficient documentation

## 2018-01-24 DIAGNOSIS — E876 Hypokalemia: Secondary | ICD-10-CM | POA: Diagnosis not present

## 2018-01-24 DIAGNOSIS — I10 Essential (primary) hypertension: Secondary | ICD-10-CM | POA: Insufficient documentation

## 2018-01-24 DIAGNOSIS — R778 Other specified abnormalities of plasma proteins: Secondary | ICD-10-CM | POA: Diagnosis not present

## 2018-01-24 DIAGNOSIS — E119 Type 2 diabetes mellitus without complications: Secondary | ICD-10-CM | POA: Insufficient documentation

## 2018-01-24 DIAGNOSIS — Z7984 Long term (current) use of oral hypoglycemic drugs: Secondary | ICD-10-CM | POA: Insufficient documentation

## 2018-01-24 DIAGNOSIS — Z833 Family history of diabetes mellitus: Secondary | ICD-10-CM | POA: Insufficient documentation

## 2018-01-24 LAB — GLUCOSE, POCT (MANUAL RESULT ENTRY): POC Glucose: 161 mg/dl — AB (ref 70–99)

## 2018-01-24 MED ORDER — HYDRALAZINE HCL 25 MG PO TABS: 25.0000 mg | ORAL_TABLET | Freq: Two times a day (BID) | ORAL | 4 refills | Status: DC

## 2018-01-24 MED FILL — hydrALAZINE HCL 25 MG TABS: 25 | 30 days supply | Qty: 60 | Fill #0

## 2018-01-24 NOTE — Progress Notes (Signed)
Open in error

## 2018-01-24 NOTE — Patient Instructions (Signed)
Please give patient an appointment with our clinical pharmacist in 2 weeks for repeat blood pressure check.  Your blood pressure is not well controlled.  We have increase hydralazine from 10 mg twice a day to 25 mg twice a day.

## 2018-01-24 NOTE — Progress Notes (Signed)
Patient ID: Austin Mercer, male    DOB: Dec 07, 1950  MRN: [id number]  CC: Follow-up   Subjective: Austin Mercer is a 67 y.o. male who presents for chronic ds management His concerns today include:  Pt gives hx of severe RT OA of knee, lower back pain x 1 yr (had x-ray that showed arthritis), HTN, DM, hep C (treated at [place], cured), ED.  HTN: Patient does not have medicines with him today but states that he is taking all of his medications as prescribed.  Potassium was low on lab work done on last visit.  I recommended potassium supplement.  He did get the prescription filled and is taking it.  Reportedly Checks BP QOD. Gives range 180s/80s  Hypercalcemia: Calcium found to be elevated.  We have been trying to get his records from the [place] to see whether this is new.  Patient reports not being informed in the past that his calcium level was elevated.  No family history of high calcium.  Parathyroid hormone was in the low normal range.  PSA was normal.  PTHrPeptide was normal.  Vitamin D level was mildly low at 28.4.  Kidney function was normal.  Serum protein electrophoresis showed an increase in percentage of beta globulin fraction.  Patient Active Problem List   Diagnosis Date Noted  . Controlled type 2 diabetes mellitus without complication, without long-term current use of insulin (HCC) 11/23/2017  . Essential hypertension 11/23/2017  . Primary osteoarthritis of right knee 11/23/2017  . Class 3 severe obesity due to excess calories with serious comorbidity and body mass index (BMI) of 45.0 to 49.9 in adult (HCC) 11/23/2017  . Age-related cataract of right eye 11/23/2017  . Hepatitis C virus infection cured after antiviral drug therapy 11/23/2017  . Chronic, continuous use of opioids 11/23/2017     Current Outpatient Medications on File Prior to Visit  Medication Sig Dispense Refill  . amLODipine (NORVASC) 10 MG tablet Take 1 tablet (10 mg total) by mouth daily. 90 tablet 1  .  atorvastatin (LIPITOR) 10 MG tablet Take 1 tablet (10 mg total) by mouth daily. 90 tablet 3  . chlorthalidone (HYGROTON) 50 MG tablet Take by mouth daily.    . diclofenac sodium (VOLTAREN) 1 % GEL Apply 2 g topically 4 (four) times daily. 100 g 3  . hydrALAZINE (APRESOLINE) 10 MG tablet Take 1 tablet (10 mg total) by mouth 2 (two) times daily. 180 tablet 1  . losartan (COZAAR) 100 MG tablet Take 1 tablet (100 mg total) by mouth daily. 90 tablet 1  . metFORMIN (GLUCOPHAGE) 1000 MG tablet Take 1 tablet (1,000 mg total) by mouth 2 (two) times daily with a meal. 180 tablet 1  . potassium chloride (KLOR-CON) 8 MEQ tablet Take 2 tablets (16 mEq total) by mouth daily. 60 tablet 4  . tadalafil (CIALIS) 5 MG tablet Take 5 mg by mouth daily as needed for erectile dysfunction.    . traMADol (ULTRAM) 50 MG tablet Take 1 tablet (50 mg total) by mouth 2 (two) times daily as needed. To fill on  Or after 12/31/2017 60 tablet 0  . traMADol (ULTRAM) 50 MG tablet Take 1 tablet (50 mg total) by mouth 2 (two) times daily as needed. 1st prescription To fill on or after 01/30/2018 60 tablet 1   No current facility-administered medications on file prior to visit.     Allergies  Allergen Reactions  . Latex Itching    Social History   Socioeconomic History  .  Marital status: Divorced    Spouse name: Not on file  . Number of children: 2  . Years of education: Not on file  . Highest education level: Not on file  Occupational History  . Occupation: retired  Engineer, production  . Financial resource strain: Not on file  . Food insecurity:    Worry: Not on file    Inability: Not on file  . Transportation needs:    Medical: Not on file    Non-medical: Not on file  Tobacco Use  . Smoking status: Never Smoker  Substance and Sexual Activity  . Alcohol use: Yes    Comment: beer  . Drug use: No  . Sexual activity: Not on file  Lifestyle  . Physical activity:    Days per week: Not on file    Minutes per session:  Not on file  . Stress: Not on file  Relationships  . Social connections:    Talks on phone: Not on file    Gets together: Not on file    Attends religious service: Not on file    Active member of club or organization: Not on file    Attends meetings of clubs or organizations: Not on file    Relationship status: Not on file  . Intimate partner violence:    Fear of current or ex partner: Not on file    Emotionally abused: Not on file    Physically abused: Not on file    Forced sexual activity: Not on file  Other Topics Concern  . Not on file  Social History Narrative  . Not on file    Family History  Problem Relation Age of Onset  . Diabetes Father     Past Surgical History:  Procedure Laterality Date  . ANKLE FRACTURE SURGERY      ROS: Review of Systems MSK: Requests refill on tramadol.  Description was generated for him a few days ago and sent to the pharmacy.  Doing okay on tramadol.  Reports some slight tenderness today in the muscles below the right scapula x2 days.  He notices it only when he reaches for something with his right arm PHYSICAL EXAM: BP (!) 183/94   Pulse 69   Temp 98.2 F (36.8 C) (Oral)   Resp 16   Wt (!) 343 lb 3.2 oz (155.7 kg)   SpO2 98%   BMI 46.55 kg/m   Physical Exam  General appearance - alert, well appearing, and in no distress Mental status - normal mood, behavior, speech, dress, motor activity, and thought processes Chest - clear to auscultation, no wheezes, rales or rhonchi, symmetric air entry Heart - normal rate, regular rhythm, normal S1, S2, no murmurs, rubs, clicks or gallops Musculoskeletal -slight tenderness in the paraspinal muscle below the right scapula Extremities - peripheral pulses normal, no pedal edema, no clubbing or cyanosis  Results for orders placed or performed in visit on 01/24/18  POCT glucose (manual entry)  Result Value Ref Range   POC Glucose 161 (A) 70 - 99 mg/dl   Lab Results  Component Value Date    HGBA1C 7.0 11/23/2017     ASSESSMENT AND PLAN: 1. Essential hypertension Not at goal.  Increase hydralazine to 25 mg twice a day. Encourage him to limit salt in the foods. - hydrALAZINE (APRESOLINE) 25 MG tablet; Take 1 tablet (25 mg total) by mouth 2 (two) times daily.  Dispense: 60 tablet; Refill: 4  2. Hypokalemia - Potassium  3. Hypercalcemia Patient to  sign release today for [place] to get records from the [place]. I did fine chemistry test done back in 2013 in the EMR that showed calcium of 10.8 so this looks like it is chronic.  I would like to check 24-hour urine collection to screen for familial hypocalciuric hypercalcemia.  We will also check chest x-ray to see if any changes suggestive of sarcoid. - DG Chest 2 View; Future - Vitamin D 1,25 dihydroxy - Calcium, Urine, 24 Hour; Future  4. Abnormal SPEP This was done as part of the work-up of hypercalcemia.  Doubt the results is of any significance but we will refer to hematology  - Ambulatory referral to Hematology  5. Controlled type 2 diabetes mellitus without complication, without long-term current use of insulin (HCC) Continue metformin - POCT glucose (manual entry) - Microalbumin / creatinine urine ratio   Patient was given the opportunity to ask questions.  Patient verbalized understanding of the plan and was able to repeat key elements of the plan.   Orders Placed This Encounter  Procedures  . Microalbumin / creatinine urine ratio  . POCT glucose (manual entry)     Requested Prescriptions    No prescriptions requested or ordered in this encounter    No follow-ups on file.  Jonah Blue, MD, FACP

## 2018-01-25 ENCOUNTER — Ambulatory Visit (HOSPITAL_COMMUNITY)
Admission: RE | Admit: 2018-01-25 | Discharge: 2018-01-25 | Disposition: A | Discharge status: Home / Self Care | Payer: Medicare HMO | Source: Ambulatory Visit | Attending: Internal Medicine | Admitting: Internal Medicine

## 2018-01-25 ENCOUNTER — Other Ambulatory Visit: Payer: Self-pay | Admitting: Internal Medicine

## 2018-01-25 DIAGNOSIS — J9811 Atelectasis: Secondary | ICD-10-CM | POA: Insufficient documentation

## 2018-01-25 DIAGNOSIS — R079 Chest pain, unspecified: Secondary | ICD-10-CM | POA: Diagnosis not present

## 2018-01-25 LAB — MICROALBUMIN / CREATININE URINE RATIO
Creatinine, Urine: 88.1 mg/dL
Microalb/Creat Ratio: 1048.4 mg/g creat — ABNORMAL HIGH (ref 0.0–30.0)
Microalbumin, Urine: 923.6 ug/mL

## 2018-01-25 MED ORDER — POTASSIUM CHLORIDE ER 8 MEQ PO TBCR: 16.0000 meq | EXTENDED_RELEASE_TABLET | Freq: Two times a day (BID) | ORAL | 6 refills | Status: DC

## 2018-01-25 MED FILL — POTASSIUM CL ER 8 MEQ CAP: 8 | 30 days supply | Qty: 120 | Fill #0

## 2018-01-27 ENCOUNTER — Encounter: Payer: Self-pay | Admitting: Internal Medicine

## 2018-01-27 DIAGNOSIS — R809 Proteinuria, unspecified: Secondary | ICD-10-CM | POA: Insufficient documentation

## 2018-01-28 LAB — POTASSIUM: Potassium: 3.2 mmol/L — ABNORMAL LOW (ref 3.5–5.2)

## 2018-01-28 LAB — VITAMIN D 1,25 DIHYDROXY
Vitamin D 1, 25 (OH)2 Total: 46 pg/mL
Vitamin D2 1, 25 (OH)2: <10 pg/mL
Vitamin D3 1, 25 (OH)2: 46 pg/mL

## 2018-01-28 NOTE — Addendum Note (Signed)
Addended by: Paschal Dopp on: 01/28/2018 10:35 AM   Modules accepted: Orders

## 2018-01-29 ENCOUNTER — Encounter: Payer: Self-pay | Admitting: Hematology and Oncology

## 2018-01-29 LAB — BASIC METABOLIC PANEL
BUN/Creatinine Ratio: 12 (ref 10–24)
BUN: 12 mg/dL (ref 8–27)
CO2: 21 mmol/L (ref 20–29)
Calcium: 11.5 mg/dL — ABNORMAL HIGH (ref 8.6–10.2)
Chloride: 102 mmol/L (ref 96–106)
Creatinine, Ser: 1.04 mg/dL (ref 0.76–1.27)
GFR calc Af Amer: 87 mL/min/{1.73_m2} (ref >59)
GFR calc non Af Amer: 75 mL/min/{1.73_m2} (ref >59)
Glucose: 167 mg/dL — ABNORMAL HIGH (ref 65–99)
Potassium: 3.4 mmol/L — ABNORMAL LOW (ref 3.5–5.2)
Sodium: 138 mmol/L (ref 134–144)

## 2018-01-29 LAB — CREATININE, URINE, 24 HOUR
Creatinine, 24H Ur: 2093 mg/24 hr — ABNORMAL HIGH (ref 1000–2000)
Creatinine, Urine: 70 mg/dL

## 2018-01-29 LAB — CALCIUM, URINE, 24 HOUR
Calcium, 24H Urine: 391.7 mg/24 hr — ABNORMAL HIGH (ref 100.0–300.0)
Calcium, Urine: 13.1 mg/dL

## 2018-01-30 ENCOUNTER — Encounter: Payer: Self-pay | Admitting: Hematology and Oncology

## 2018-01-30 MED FILL — traMADol HCL 50 MG TABS: 50 | 30 days supply | Qty: 60 | Fill #0

## 2018-02-06 ENCOUNTER — Encounter: Payer: Self-pay | Admitting: Internal Medicine

## 2018-02-06 ENCOUNTER — Telehealth: Payer: Self-pay | Admitting: Internal Medicine

## 2018-02-06 NOTE — Progress Notes (Signed)
Received some records from [place].  Pt with hx of hep c with cirrhosis (radiologically).  Treated with Harvoni for 12 wks in 2016.  Last liver US  Done 04/2017 showed changes c/w chronic hepatocellular ds.  No focal mass. Ca level 10.9 09/15/2017.  SPEP revealed elevated beta globulins.  No monoclonal proteins detected. Last CBC done 01/2017 revealed H/H 12.7/37.5 with MCV 79.4.

## 2018-02-06 NOTE — Telephone Encounter (Signed)
Dr. Laural Benes contacted pt and pt was sleep. Pt has an appointment with Franky Macho tomorrow morning at 10. Will go over message with pt when he comes in

## 2018-02-06 NOTE — Progress Notes (Signed)
   S: PCP: Dr. Laural Benes     Patient arrives in good spirits. Presents to the clinic for hypertension management. Patient was referred by Dr. Laural Benes on 01/24/18. BP at that visit was 183/94. Hydralazine increased to 25 mg BID.  Today, pt denies CP, SOB, HA or blurred vision. Denies BLE edema.  Patient denies adherence with medications. Has taken this morning.  Current BP Medications include:   - amlodipine 10 mg daily - chlorthalidone 50 mg daily - Hydralazine 25 mg BID. Reports only taking morning dose - losartan 100 mg daily  Dietary habits include:  - 2-3 drinks energy drinks/day (Red Bull)  - Reports eating 2 sausage McMuffins before visit today; does not limit salt Exercise habits include: - does not exercise Family / Social history:  - FHx: DM (father) - Tobacco: never smoker - Alcohol: reports drinking beer  ASCVD risk: ~48%  Home BP readings:  - does not check at home  O:  L arm after 5 minutes rest: 150/77, HR 86  Last 3 Office BP readings: BP Readings from Last 3 Encounters:  01/24/18 (!) 183/94  11/23/17 (!) 157/91  12/30/11 161/99   BMET    Component Value Date/Time   NA 138 01/28/2018 1114   K 3.4 (L) 01/28/2018 1114   CL 102 01/28/2018 1114   CO2 21 01/28/2018 1114   GLUCOSE 167 (H) 01/28/2018 1114   GLUCOSE 149 (H) 12/30/2011 1027   BUN 12 01/28/2018 1114   CREATININE 1.04 01/28/2018 1114   CALCIUM 11.5 (H) 01/28/2018 1114   GFRNONAA 75 01/28/2018 1114   GFRAA 87 01/28/2018 1114   Renal function: Estimated Creatinine Clearance: 109 mL/min (by C-G formula based on SCr of 1.04 mg/dL).  A/P: Hypertension longstanding currently uncontrolled on current medications. BP Goal <130/80 mmHg. Patient is not adherent with current medications.  -Continued anti-hypertensives.  -Encouraged adherence to the evening dose of hydralazine -Take amlodipine in the evening -Chlorthalidone, AM dose of hydralazine, and losartan in the morning -Counseled on  lifestyle modifications for blood pressure control including reduced dietary sodium, increased exercise, adequate sleep  Results reviewed and written information provided. Total time in face-to-face counseling 15 minutes.   F/U Clinic Visit in 1 month for BP recheck.    Patient seen with: Leanne Chang, PharmD Candidate [employer]  Butch Penny, PharmD, CPP Clinical Pharmacist [facility] [phone]

## 2018-02-06 NOTE — Telephone Encounter (Signed)
PC placed to pt today to go over lab results.  I left message on VM stating who I am and why I was calling.  Will have my CMA try to reach him today or this wk. CMA: 1. Let pt know that he has a moderate amount of protein in the urine. This may be an early indication of DM affecting the kidney. His blood pressure medication called Losartan will help to protect the kidneys. Will follow over time. 2.  We did get some records from [place].  Looks like the elevated Calcium level was documented on blood test that they did 09/2017.  However, not much work up was subsequently done by them.   The 24 hr urine collection for Calcium reveals that he does not have an inherited condition causing the elevated Calcium in the blood. (For my info urine Ca/Cr ratio +0.02).  Therefore, I would like for him to keep the appt with the specialist who I referred him to.  Appt scheduled for later this month. 3.  Potassium level is better but still low.  Is he taking the Potassium supplement 8 meq 2 tabs daily?

## 2018-02-07 ENCOUNTER — Encounter: Payer: Self-pay | Admitting: Pharmacist

## 2018-02-07 ENCOUNTER — Ambulatory Visit: Payer: Medicare HMO | Attending: Family Medicine | Admitting: Pharmacist

## 2018-02-07 VITALS — BP 150/77 | HR 86

## 2018-02-07 DIAGNOSIS — I10 Essential (primary) hypertension: Secondary | ICD-10-CM

## 2018-02-07 NOTE — Patient Instructions (Signed)
Thank you for coming to see [place] today.   Blood pressure today is improving.   In the morning: 1 pill of chlorthalidone 50 mg  1 pill of losartan 100 mg  1 pill of hydralazine 25 mg   In the evening: 1 pill of amlodipine 10 mg  1 pill of hydralazine 25 mg  Limiting salt and caffeine, as well as exercising as able for at least 30 minutes for 5 days out of the week, can also help you lower your blood pressure.  Take your blood pressure at home if you are able. Please write down these numbers and bring them to your visits.  If you have any questions about medications, please call me [phone].  Franky Macho

## 2018-02-12 ENCOUNTER — Encounter: Payer: Self-pay | Admitting: Hematology and Oncology

## 2018-02-12 ENCOUNTER — Inpatient Hospital Stay: Payer: Medicare HMO

## 2018-02-12 ENCOUNTER — Telehealth: Payer: Self-pay | Admitting: Hematology and Oncology

## 2018-02-12 ENCOUNTER — Inpatient Hospital Stay: Payer: Medicare HMO | Attending: Hematology and Oncology | Admitting: Hematology and Oncology

## 2018-02-12 VITALS — BP 178/91 | HR 73 | Temp 98.4°F | Resp 18 | Ht 72.0 in | Wt 347.5 lb

## 2018-02-12 DIAGNOSIS — E119 Type 2 diabetes mellitus without complications: Secondary | ICD-10-CM

## 2018-02-12 DIAGNOSIS — Z87891 Personal history of nicotine dependence: Secondary | ICD-10-CM | POA: Insufficient documentation

## 2018-02-12 DIAGNOSIS — I10 Essential (primary) hypertension: Secondary | ICD-10-CM | POA: Insufficient documentation

## 2018-02-12 DIAGNOSIS — R778 Other specified abnormalities of plasma proteins: Secondary | ICD-10-CM

## 2018-02-12 LAB — CBC WITH DIFFERENTIAL (CANCER CENTER ONLY)
Abs Immature Granulocytes: 0.01 10*3/uL (ref 0.00–0.07)
Basophils Absolute: 0.1 10*3/uL (ref 0.0–0.1)
Basophils Relative: 1 %
Eosinophils Absolute: 0.2 10*3/uL (ref 0.0–0.5)
Eosinophils Relative: 2 %
HCT: 37.5 % — ABNORMAL LOW (ref 39.0–52.0)
Hemoglobin: 12.4 g/dL — ABNORMAL LOW (ref 13.0–17.0)
Immature Granulocytes: 0 %
Lymphocytes Relative: 59 %
Lymphs Abs: 5.3 10*3/uL — ABNORMAL HIGH (ref 0.7–4.0)
MCH: 27.1 pg (ref 26.0–34.0)
MCHC: 33.1 g/dL (ref 30.0–36.0)
MCV: 81.9 fL (ref 80.0–100.0)
Monocytes Absolute: 0.9 10*3/uL (ref 0.1–1.0)
Monocytes Relative: 10 %
Neutro Abs: 2.6 10*3/uL (ref 1.7–7.7)
Neutrophils Relative %: 28 %
Platelet Count: 160 10*3/uL (ref 150–400)
RBC: 4.58 MIL/uL (ref 4.22–5.81)
RDW: 16.2 % — ABNORMAL HIGH (ref 11.5–15.5)
WBC Count: 9 10*3/uL (ref 4.0–10.5)
nRBC: 0 % (ref 0.0–0.2)

## 2018-02-12 LAB — COMPREHENSIVE METABOLIC PANEL
ALT: 32 U/L (ref 0–44)
AST: 24 U/L (ref 15–41)
Albumin: 3.8 g/dL (ref 3.5–5.0)
Alkaline Phosphatase: 47 U/L (ref 38–126)
Anion gap: 7 (ref 5–15)
BUN: 10 mg/dL (ref 8–23)
CO2: 26 mmol/L (ref 22–32)
Calcium: 11.1 mg/dL — ABNORMAL HIGH (ref 8.9–10.3)
Chloride: 107 mmol/L (ref 98–111)
Creatinine, Ser: 1.07 mg/dL (ref 0.61–1.24)
GFR calc Af Amer: >60 mL/min (ref >60)
GFR calc non Af Amer: >60 mL/min (ref >60)
Glucose, Bld: 129 mg/dL — ABNORMAL HIGH (ref 70–99)
Potassium: 3.4 mmol/L — ABNORMAL LOW (ref 3.5–5.1)
Sodium: 140 mmol/L (ref 135–145)
Total Bilirubin: 0.5 mg/dL (ref 0.3–1.2)
Total Protein: 7.8 g/dL (ref 6.5–8.1)

## 2018-02-12 LAB — LACTATE DEHYDROGENASE: LDH: 161 U/L (ref 98–192)

## 2018-02-12 LAB — RETIC PANEL
Immature Retic Fract: 20 % — ABNORMAL HIGH (ref 2.3–15.9)
RBC.: 4.58 MIL/uL (ref 4.22–5.81)
Retic Count, Absolute: 88.9 10*3/uL (ref 19.0–186.0)
Retic Ct Pct: 1.9 % (ref 0.4–3.1)
Reticulocyte Hemoglobin: 32.8 pg (ref >27.9)

## 2018-02-12 LAB — PHOSPHORUS: Phosphorus: 2.7 mg/dL (ref 2.5–4.6)

## 2018-02-12 NOTE — Telephone Encounter (Signed)
Scheduled appt per 11/12 los- gave patient AVS and calender per los.   

## 2018-02-12 NOTE — Progress Notes (Signed)
Outpatient Hematology/Oncology Initial Consultation  Patient Name:  Austin Mercer  DOB: November 11, 1950   Date of Service: February 12, 2018  Referring Provider: Karle Plumber, MD [place], [place] [address]   Consulting Physician: Henreitta Leber, MD Hematology/Oncology  Reason for Referral: In the setting of an abnormal serum protein electrophoresis, he presents now for further diagnostic and therapeutic recommendations.  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  His primary care provider is Dr. Karle Plumber. He is alone at this first visit.  On January 24, 2018 with routine laboratory testing, his calcium was found to be elevated.  He work-up and evaluation to date revealed the PTHrpeptide was normal.  The parathyroid hormone was low normal range.  His renal function was normal.  A serum protein electrophoresis however showed an increase in beta globulin fraction.  His prior records from the [facility] were requested.  Likewise a two-view chest x-ray, vitamin D level, and 24-hour urine calcium was requested.  He reports no coronary artery disease, angina, or myocardial infarction.  He has no cardiac dysrhythmia.  He denies any thyroid disease.  He reports no recurrent viral hepatitis, inflammatory bowel disease, or symptomatic diverticulosis.  He denies renal or liver disorder.  He has no rheumatoid or gouty arthritis.  He denies peripheral arterial venous thromboembolic disease.  His alcohol intake in the past was very heavy  especially while in the [employer]  He was in the [employer] from [phone].  While he was in [employer], he used marijuana and heroin.  He has since discontinued both.  His alcohol intake since 2011 consist of beer, sometimes twice weekly.  Both his appetite and weight remain stable. He has no visual changes or hearing deficit.  He has no headache, syncope, or near syncopal episodes. He does experience positional dizziness and lightheadedness. He denies rash or itching.  He has no unusual cough, sore throat, orthopnea.  He denies dyspnea at rest.  He has chronic stable exertional dyspnea unchanged over his baseline.  There is no pain or difficulty in swallowing.  No fever, shaking chills, sweats, or flulike symptoms are evident.  He has infrequent episodes of dyspepsia associated with dietary indiscretion.  There is no nausea, vomiting, or diarrhea.  He does experience straining at stool and loose bowels generally once or twice a week.  He has no melena or bright red blood per rectum. There are no new focal areas of bone, joint, muscle pain. He has no numbness or tingling in the fingers or toes.  It is with this background he presents now for further diagnostic and therapeutic recommendations in the setting of an abnormal serum protein electrophoresis and mild hypercalcemia as outlined above.  Past Medical History:  Diagnosis Date  . Hypertension    Surgical History: Past Surgical History:  Procedure Laterality Date  . ANKLE FRACTURE SURGERY     Family History  Problem Relation Age of Onset  . Diabetes Father   Mother: Deceased: Age 83 years: GERD. Father: Age 31 years: DM/pneumonia He has no brothers. Sisters (2) DM.  Social History   Socioeconomic History  . Marital status: Divorced  Spouse name: Not on file  . Number of children: 2  . Years of education: Not on file  . Highest education level: Not on file  Occupational History  . Occupation: retired  Scientific laboratory technician  . Financial  resource strain: Not on file  . Food insecurity:    Worry: Not on file    Inability: Not on file  . Transportation needs:    Medical: Not on file    Non-medical: Not on file  Tobacco Use  . Smoking status: Former Smoker    Last attempt to quit: 1973    Years since quitting: 46.8  . Smokeless tobacco: Never Used  Substance and Sexual Activity  . Alcohol use: Yes    Comment: Beer, Red wine  . Drug use: No  . Sexual activity: Not on file  Lifestyle  . Physical activity:    Days per week: Not on file    Minutes per session: Not on file  . Stress: Not on file  Relationships  . Social connections:    Talks on phone: Not on file    Gets together: Not on file    Attends religious service: Not on file    Active member of club or organization: Not on file    Attends meetings of clubs or organizations: Not on file    Relationship status: Not on file  . Intimate partner violence:    Fear of current or ex partner: Not on file    Emotionally abused: Not on file    Physically abused: Not on file    Forced sexual activity: Not on file  Other Topics Concern  . Not on file  Social History Narrative  . Not on file  Jae Cornette is a widower. He worked previously in Theatre manager. He began smoking at the age of 73 years. He smoked a maximum 1/2 pack of cigarettes daily. He stopped smoking completely at the age of 17 years. He was in the [employer] from 1970 04-1974. He has no significant use of pipe, cigars, or chewing tobacco.  His alcohol intake in the past was heavy.   In 2011 he cut back significantly with beer generally twice weekly. Especially during his [place] years he smoked marijuana and used heroin, now none.  Transfusion History: No prior transfusion  Exposure History: He has no known exposure to toxic chemicals, radiation, or pesticides.  Allergies  Allergen Reactions  . Latex Itching  He has no known medical allergies No food or seasonal allergies  Current Outpatient  Medications on File Prior to Visit  Medication Sig  . amLODipine (NORVASC) 10 MG tablet Take 1 tablet (10 mg total) by mouth daily.  Marland Kitchen atorvastatin (LIPITOR) 10 MG tablet Take 1 tablet (10 mg total) by mouth daily.  . chlorthalidone (HYGROTON) 50 MG tablet Take by mouth daily.  . diclofenac sodium (VOLTAREN) 1 % GEL Apply 2 g topically 4 (four) times daily.  . hydrALAZINE (APRESOLINE) 25 MG tablet Take 1 tablet (25 mg total) by mouth 2 (two) times daily.  Marland Kitchen losartan (COZAAR) 100 MG tablet Take 1 tablet (100 mg total) by mouth daily.  . metFORMIN (GLUCOPHAGE) 1000 MG tablet Take 1 tablet (1,000 mg total) by mouth 2 (two) times daily with a meal.  . potassium chloride (KLOR-CON) 8 MEQ tablet Take 2 tablets (16 mEq total) by mouth 2 (two) times daily.  . tadalafil (CIALIS) 5 MG tablet Take 5 mg by mouth daily as needed for erectile dysfunction.  . traMADol (  ULTRAM) 50 MG tablet Take 1 tablet (50 mg total) by mouth 2 (two) times daily as needed. To fill on  Or after 12/31/2017  . traMADol (ULTRAM) 50 MG tablet Take 1 tablet (50 mg total) by mouth 2 (two) times daily as needed. 1st prescription To fill on or after 01/30/2018   No current facility-administered medications on file prior to visit.     Review of Systems: Constitutional: No fever, sweats, or shaking chills. No appetite or weight deficit.  Morbid obesity.  Decreased energy level. Skin: No rash, scaling, sores, lumps, or jaundice. HEENT: No visual changes or hearing deficit. Pulmonary: No unusual cough, sore throat, or orthopnea; DOE COPD; former smoker. Cardiovascular: No coronary artery disease, angina, or myocardial infarction.  No cardiac dysrhythmia. essential hypertension and dyslipidemia. Gastrointestinal: No indigestion, dysphagia, abdominal pain, diarrhea; constipation/straining at stool.  BM 1-2 times weekly. No change in bowel habits. Genitourinary: No urinary frequency, urgency, hematuria, or dysuria; urinary retention; BPH;  chlorthalidone recently increased. 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 Hematologic: No bleeding tendency or easy bruisability. Endocrine: No intolerance to heat or cold; no thyroid disease or diabetes mellitus. Vascular: No peripheral arterial or venous thromboembolic disease. Psychological: No anxiety, depression, or mood changes; former alcohol and substance abuse; former tobacco dependence. Neurological: Positional dizziness and lightheadedness; no syncope, or near syncopal episodes; no numbness or tingling in the fingers or toes.  Physical Examination: Vital Signs: Body surface area is 2.83 meters squared.  Vitals:   02/12/18 1105  BP: (!) 178/91  Pulse: 73  Resp: 18  Temp: 98.4 F (36.9 C)  SpO2: 96%    Filed Weights   02/12/18 1105  Weight: (!) 347 lb 8 oz (157.6 kg)  ECOG PERFORMANCE STATUS:1 Constitutional:  Lansing Stankey is a fully nourished and developed African-American albeit overweight.  He looks age appropriate.  He is friendly and cooperative without respiratory compromise at rest. Skin: No rashes, scaling, dryness, jaundice, or itching. HEENT: Head is normocephalic and atraumatic.  Pupils are equal round and reactive to light and accommodation.  Sclerae are anicteric.  Conjunctivae are pink.  No sinus tenderness nor oropharyngeal lesions.  Lips without cracking or peeling; tongue without mass, inflammation, or nodularity.  Mucous membranes are moist.  Poor dentition. Neck: Supple and symmetric.  No jugular venous distention or thyromegaly.  Trachea is midline. Lymphatics: No cervical or supraclavicular lymphadenopathy.  No epitrochlear, axillary, or inguinal lymphadenopathy is appreciated. Respiratory/chest: Thorax is symmetrical.  Breath sounds are clear to auscultation and percussion.  Normal excursion and respiratory effort. Back: Symmetric without  deformity or tenderness. Cardiovascular: Heart rate and rhythm are regular. Gastrointestinal: Abdomen is soft, obese, nontender; no organomegaly.  Bowel sounds are normoactive.  No masses are appreciated. Extremities: In the lower extremities, there is no asymmetric swelling, erythema, tenderness, or cord formation.  No clubbing or cyanosis; trace bipedal edema. Hematologic: No petechiae, hematomas, or ecchymoses. Psychological:  He is oriented to person, place, and time; normal affect. Neurological: There are no gross neurologic deficits.  Laboratory Results: I have reviewed the data as listed: February 12, 2018  Ref Range & Units 12:37 55moago 64yrgo [id number]  WBC Count 4.0 - 10.5 K/uL 9.0  9.9 R 8.0    RBC 4.22 - 5.81 MIL/uL 4.58  5.19 R 4.89    Hemoglobin 13.0 - 17.0 g/dL 12.4Low   13.9 R 14.0  15.6   HCT  39.0 - 52.0 % 37.5Low   41.0 R 39.3  46.0   MCV 80.0 - 100.0 fL 81.9  79 R 80.4 R   MCH 26.0 - 34.0 pg 27.1  26.8 R 28.6    MCHC 30.0 - 36.0 g/dL 33.1  33.9 R 35.6    RDW 11.5 - 15.5 % 16.2High   16.2High  R 14.3    Platelet Count 150 - 400 K/uL 160  181 R 150    nRBC 0.0 - 0.2 % 0.0      Neutrophils Relative % % 28      Neutro Abs 1.7 - 7.7 K/uL 2.6      Lymphocytes Relative % 59      Lymphs Abs 0.7 - 4.0 K/uL 5.3High       Monocytes Relative % 10      Monocytes Absolute 0.1 - 1.0 K/uL 0.9      Eosinophils Relative % 2      Eosinophils Absolute 0.0 - 0.5 K/uL 0.2      Basophils Relative % 1      Basophils Absolute 0.0 - 0.1 K/uL 0.1      Immature Granulocytes % 0      Abs Immature Granulocytes 0.00 - 0.07 K/uL 0.01       12:37 (02/12/18) 2wk ago (01/28/18) 2wk ago (01/24/18) 71moago (12/05/17) 222mogo (11/27/17)    Sodium 135 - 145 mmol/L 140  138 R     Potassium 3.5 - 5.1 mmol/L 3.4Low   3.4Low  R 3.2Low  R    Chloride 98 - 111 mmol/L 107  102 R     CO2 22 - 32 mmol/L 26  21 R     Glucose, Bld 70 - 99 mg/dL 129High   167High  R     BUN 8 - 23 mg/dL 10  12 R       Creatinine, Ser 0.61 - 1.24 mg/dL 1.07  1.04 R     Calcium 8.9 - 10.3 mg/dL 11.1High   11.5High  R   11.5High  R  Total Protein 6.5 - 8.1 g/dL 7.8    7.7 R   Albumin 3.5 - 5.0 g/dL 3.8       AST 15 - 41 U/L 24       ALT 0 - 44 U/L 32       Alkaline Phosphatase 38 - 126 U/L 47       Total Bilirubin 0.3 - 1.2 mg/dL 0.5       GFR calc non Af Amer >60 mL/min >60  75 R     GFR calc Af Amer >60 mL/min >60  87 R     Comment: (NOTE)     Ref Range & Units 12:37  Retic Ct Pct 0.4 - 3.1 % 1.9   RBC. 4.22 - 5.81 MIL/uL 4.58   Retic Count, Absolute 19.0 - 186.0 K/uL 88.9   Immature Retic Fract 2.3 - 15.9 % 20.0High    Reticulocyte Hemoglobin >27.9 pg 32.8   Comment:     Given the high negative predictive  value of a RET-He result > 32 pg  iron deficiency is essentially  excluded. If this patient is anemic  other etiologies should be  considered.   Phosphorus 2.7  Summary/Assessment: In the setting of an abnormal serum protein electrophoresis, he presents now for further diagnostic and therapeutic recommendations.  On January 24, 2018 with routine laboratory testing, his calcium was found to be elevated.  He work-up and evaluation to date revealed the PTHrpeptide was normal.  The parathyroid hormone was low normal range.  His renal function was normal.  A serum protein electrophoresis however showed an increase in beta globulin fraction.  His [facility] from [facility] were requested.  Likewise a two-view chest x-ray, vitamin D level, and 24-hour urine calcium was requested.  In addition to a hypercalcemia evaluation, as outlined above, his chlorthalidone was increased to twice daily.  He reports no coronary artery disease, angina, or myocardial infarction.  He has no cardiac dysrhythmia.  He denies any thyroid disease.  He reports no recurrent viral hepatitis, inflammatory bowel disease, or symptomatic diverticulosis.  He denies renal or liver disorder.  He has no  rheumatoid or gouty arthritis.  He denies peripheral arterial venous thromboembolic disease.  His alcohol intake in the past was very heavy especially while in the [employer].  He was in the [employer] from [phone].  While he was in [employer], he used marijuana and heroin.  He has since discontinued both.  His alcohol intake since 2011 consist of beer, sometimes twice weekly.  Both his appetite and weight remain stable. He has no visual changes or hearing deficit.  He has no headache, syncope, or near syncopal episodes.  He does experience positional dizziness and lightheadedness. He denies rash or itching.  He has no unusual cough, sore throat, orthopnea.  He denies dyspnea at rest.  He has chronic stable exertional dyspnea unchanged over his baseline.  There is no pain or difficulty in swallowing.  No fever, shaking chills, sweats, or flulike symptoms are evident.  He has infrequent episodes of dyspepsia associated with dietary indiscretion.  There is no nausea, vomiting, or diarrhea.  He does experience straining at stool and loose bowels generally once or twice a week.  He has no melena or bright red blood per rectum. There are no new focal areas of bone, joint, muscle pain. He has no numbness or tingling in the fingers or toes.  Recommendation/Plan: I had a detailed discussion with Daniell regarding his previous laboratory studies, including the incomplete information on initial serum protein electrophoresis.  The results of his laboratory studies were not available at the time of discharge.  They will be discussed in detail the time of his next visit.  A serum protein electrophoresis, immunofixation, and free kappa/lambda light chain analysis was performed.  Those results are pending.  Because of his mild hypercalcemia, relatively unchanged, it was recommended that thiazide diuretics, like chlorthalidone, be substituted for since they may decrease renal calcium excretion.  Generally, they should be  avoided with hypercalcemia.  The total time spent discussing the prior laboratory studies, role and rationale for protein studies, and preliminary considerations was 60 minutes. At least 50% of that time was spent in face-to-face discussion, counseling, and answering questions. All questions were answered to his satisfaction.   This note was dictated using voice activated technology/software.  Unfortunately, typographical errors are not uncommon, and transcription is subject to mistakes and regrettably misinterpretation.  If necessary, clarification of the above information can be discussed with me at any time.  Thank you Dr. Wynetta Emery for allowing my participation in the care of Odis Tucholski. I will keep you closely informed as the results of his preliminary laboratory data become available.  Please do not hesitate to call should any questions arise regarding this initial consultation and discussion.  FOLLOW UP: AS DIRECTED   cc:  Karle Plumber, MD    Henreitta Leber, MD  Hematology/Oncology [place] [address] [place], [place] [address] Office: [phone] DXIP: [phone]

## 2018-02-12 NOTE — Patient Instructions (Signed)
We discussed the results of your prior laboratory studies with Dr. Laural BenesJohnson which suggests a possible abnormal protein in the blood and an elevated calcium level.  Laboratory studies will be obtained today to confirm the presence of an abnormal protein in the blood and the possible source of an elevated calcium level.  Barring any unforeseen complications, your next scheduled doctor visit is on March 07, 2018.  Please do not hesitate to call if any new or untoward problems arise in the interim.  Thank you!

## 2018-02-13 LAB — KAPPA/LAMBDA LIGHT CHAINS
Kappa free light chain: 45.8 mg/L — ABNORMAL HIGH (ref 3.3–19.4)
Kappa, lambda light chain ratio: 1.77 — ABNORMAL HIGH (ref 0.26–1.65)
Lambda free light chains: 25.9 mg/L (ref 5.7–26.3)

## 2018-02-13 LAB — PROTEIN ELECTROPHORESIS, SERUM
A/G Ratio: 1.1 (ref 0.7–1.7)
Albumin ELP: 3.9 g/dL (ref 2.9–4.4)
Alpha-1-Globulin: 0.2 g/dL (ref 0.0–0.4)
Alpha-2-Globulin: 0.8 g/dL (ref 0.4–1.0)
Beta Globulin: 1.3 g/dL (ref 0.7–1.3)
Gamma Globulin: 1.2 g/dL (ref 0.4–1.8)
Globulin, Total: 3.5 g/dL (ref 2.2–3.9)
Total Protein ELP: 7.4 g/dL (ref 6.0–8.5)

## 2018-02-13 LAB — PTH, INTACT AND CALCIUM
Calcium, Total (PTH): 11 mg/dL — ABNORMAL HIGH (ref 8.6–10.2)
PTH: 32 pg/mL (ref 15–65)

## 2018-02-13 LAB — IGG, IGA, IGM
IgA: 472 mg/dL — ABNORMAL HIGH (ref 61–437)
IgG (Immunoglobin G), Serum: 1472 mg/dL (ref 700–1600)
IgM (Immunoglobulin M), Srm: 66 mg/dL (ref 20–172)

## 2018-02-13 LAB — CALCIUM, IONIZED: Calcium, Ionized, Serum: 6.1 mg/dL — ABNORMAL HIGH (ref 4.5–5.6)

## 2018-02-15 LAB — IMMUNOFIXATION ELECTROPHORESIS
IgA: 485 mg/dL — ABNORMAL HIGH (ref 61–437)
IgG (Immunoglobin G), Serum: 1380 mg/dL (ref 700–1600)
IgM (Immunoglobulin M), Srm: 53 mg/dL (ref 20–172)
Total Protein ELP: 7.4 g/dL (ref 6.0–8.5)

## 2018-02-25 ENCOUNTER — Telehealth: Payer: Self-pay | Admitting: Internal Medicine

## 2018-02-25 DIAGNOSIS — M1711 Unilateral primary osteoarthritis, right knee: Secondary | ICD-10-CM

## 2018-02-25 NOTE — Telephone Encounter (Signed)
Will forward to pcp

## 2018-02-25 NOTE — Telephone Encounter (Signed)
Patient called because she would like to get a knee referral specialist sent to [facility] located on [address]. Please follow up.

## 2018-03-04 ENCOUNTER — Telehealth: Payer: Self-pay | Admitting: Internal Medicine

## 2018-03-04 DIAGNOSIS — M25561 Pain in right knee: Secondary | ICD-10-CM | POA: Diagnosis not present

## 2018-03-04 DIAGNOSIS — M1711 Unilateral primary osteoarthritis, right knee: Secondary | ICD-10-CM | POA: Diagnosis not present

## 2018-03-04 NOTE — Telephone Encounter (Signed)
Patient came in the office for a refill on the Tramadol. Please send to [facility]

## 2018-03-05 ENCOUNTER — Other Ambulatory Visit: Payer: Self-pay | Admitting: Internal Medicine

## 2018-03-05 DIAGNOSIS — E119 Type 2 diabetes mellitus without complications: Secondary | ICD-10-CM

## 2018-03-05 MED ORDER — ACCU-CHEK GUIDE W/DEVICE KIT: 1.0000 | PACK | Freq: Every day | 0 refills | Status: DC

## 2018-03-05 MED ORDER — ACCU-CHEK FASTCLIX LANCETS MISC: 12 refills | Status: DC

## 2018-03-05 MED ORDER — GLUCOSE BLOOD VI STRP: ORAL_STRIP | 12 refills | Status: DC

## 2018-03-05 MED FILL — traMADol HCL 50 MG TABS: 50 | 30 days supply | Qty: 60 | Fill #1

## 2018-03-05 NOTE — Telephone Encounter (Signed)
Austin Mercer could you let pt know

## 2018-03-05 NOTE — Telephone Encounter (Signed)
Called and informed the patient that the medication is not due until the end of December.

## 2018-03-07 ENCOUNTER — Inpatient Hospital Stay: Payer: Medicare HMO | Attending: Hematology and Oncology | Admitting: Hematology and Oncology

## 2018-03-07 ENCOUNTER — Other Ambulatory Visit: Payer: Self-pay | Admitting: Hematology and Oncology

## 2018-03-07 ENCOUNTER — Encounter: Payer: Self-pay | Admitting: Hematology and Oncology

## 2018-03-07 VITALS — BP 156/82 | HR 82 | Temp 98.6°F | Resp 19 | Ht 72.0 in | Wt 344.6 lb

## 2018-03-07 DIAGNOSIS — Z7984 Long term (current) use of oral hypoglycemic drugs: Secondary | ICD-10-CM | POA: Diagnosis not present

## 2018-03-07 DIAGNOSIS — D72820 Lymphocytosis (symptomatic): Secondary | ICD-10-CM

## 2018-03-07 DIAGNOSIS — Z79899 Other long term (current) drug therapy: Secondary | ICD-10-CM | POA: Diagnosis not present

## 2018-03-07 DIAGNOSIS — Z87891 Personal history of nicotine dependence: Secondary | ICD-10-CM

## 2018-03-07 DIAGNOSIS — R778 Other specified abnormalities of plasma proteins: Secondary | ICD-10-CM | POA: Diagnosis not present

## 2018-03-07 DIAGNOSIS — D649 Anemia, unspecified: Secondary | ICD-10-CM | POA: Diagnosis not present

## 2018-03-07 NOTE — Patient Instructions (Addendum)
We discussed in detail the results of your laboratory studies.  We are concerned that your calcium level remains mildly elevated.  Through Dr. Henriette CombsJohnson's office, discuss switching from [place] to another diuretic (water pill).  This can be contributing to your elevated calcium level.  For the present, avoid calcium rich foods as well as of vitamin D.  You do have an increase in protein within your blood.  This suggests but is not confirmatory for an underlying blood disorder.  If your calcium does not decrease at the time of your next visit, a bone marrow aspiration and biopsy will likely be recommended.  Barring any unforeseen complications, your neck scheduled doctor visit with laboratory studies is on December 30.  Please do not hesitate to call in the interim should any new or untoward problems arise.  Merry Christmas! Happy new year!  Debbe Odeaichard Ruben, MD Hematology/Oncology call was 1

## 2018-03-07 NOTE — Progress Notes (Signed)
Outpatient Progress Note Hematology/Oncology  Patient Name:  Austin Mercer  DOB: March 05, 1951   Date of Service: December 5 , 2019  Referring Provider: Karle Plumber, MD [place], [place] [address]   Consulting Physician: Henreitta Leber, MD Hematology/Oncology  Reason for Visit: In the setting of an abnormal serum protein electrophoresis, he presents now for the results of his preliminary laboratory studies and recommendations.  Brief History: Austin Mercer is a 67 year old resident of [place] whose past medical history is significant for non-insulin requiring diabetes mellitus; primary hypertension; elevated BMI; dyslipidemia; previous alcohol related seizure in 2011; previously treated hepatitis C in 2018 at the 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  His primary care provider is Dr. Karle Plumber. He is alone at this visit.  On January 24, 2018 with routine laboratory testing, his calcium was found to be elevated.  A work-up and evaluation to date revealed the PTHrpeptide was normal.  The parathyroid hormone was low-normal range.  His renal function was normal.  A serum protein electrophoresis however showed an increase in beta globulin fraction.  His prior records from the [facility] were requested.  Likewise a two-view chest x-ray, vitamin D level, and 24-hour urine calcium were requested. Those results are detailed below.  His alcohol intake in the past was very heavy especially while in the [employer]. He was in the [employer] from [id number]. While he was in [employer], he used marijuana and heroin.  He has since discontinued both.  His alcohol intake since 2011 consist of beer, sometimes twice weekly.  At the time that his initial visit, we had a detailed discussion with Larnce regarding his previous laboratory studies,  including the incomplete information on initial serum protein electrophoresis. A serum protein electrophoresis, immunofixation, and free kappa/lambda light chain analysis was performed.   Because of his mild hypercalcemia, relatively unchanged, it was recommended that thiazide diuretics, like chlorthalidone, be substituted for since they may decrease renal calcium excretion. Generally, they should be avoided with hypercalcemia.  It is with this background he presents now for the results of his preliminary laboratory studies with recommendations in the setting of an abnormal serum protein electrophoresis and mild hypercalcemia as outlined above.  Interval History: In the interim since his last visit, both his appetite and weight remain stable. He has no visual changes or hearing deficit.  He has no headache, syncope, or near syncopal episodes. He does experience positional dizziness and lightheadedness. He denies rash or itching. He has no unusual cough, sore throat, orthopnea.  He denies dyspnea at rest.  He has chronic stable exertional dyspnea unchanged over his baseline.  There is no pain or difficulty in swallowing.  No fever, shaking chills, sweats, or flulike symptoms are evident.  He has infrequent episodes of dyspepsia associated with dietary indiscretion.  There is no nausea, vomiting, or diarrhea. He does experience sometimes straining at stool and loose bowels generally once or twice a week. He has no melena or bright red blood per rectum. There are no new focal areas of bone, joint, muscle pain. He has no numbness or tingling in the fingers or toes.    Past Medical History:  Diagnosis Date  . Hypertension    Past Surgical History:  Procedure Laterality Date  . ANKLE FRACTURE SURGERY     Allergies  Allergen Reactions  . Latex Itching  He has no known  medical allergies No food or seasonal allergies  Current Outpatient Medications on File Prior to Visit  Medication Sig  . ACCU-CHEK  FASTCLIX LANCETS MISC Use as directed to test blood sugar once daily. DX: E11.9  . amLODipine (NORVASC) 10 MG tablet Take 1 tablet (10 mg total) by mouth daily.  Marland Kitchen atorvastatin (LIPITOR) 10 MG tablet Take 1 tablet (10 mg total) by mouth daily.  . Blood Glucose Monitoring Suppl (ACCU-CHEK GUIDE) w/Device KIT 1 each by Does not apply route daily. Use as directed to test blood sugar once daily. DX: E11.9  . chlorthalidone (HYGROTON) 50 MG tablet Take by mouth daily.  . diclofenac sodium (VOLTAREN) 1 % GEL Apply 2 g topically 4 (four) times daily.  Marland Kitchen glucose blood (ACCU-CHEK GUIDE) test strip Use as directed to test blood sugar once daily. DX: E11.9  . hydrALAZINE (APRESOLINE) 25 MG tablet Take 1 tablet (25 mg total) by mouth 2 (two) times daily.  Marland Kitchen losartan (COZAAR) 100 MG tablet Take 1 tablet (100 mg total) by mouth daily.  . metFORMIN (GLUCOPHAGE) 1000 MG tablet Take 1 tablet (1,000 mg total) by mouth 2 (two) times daily with a meal.  . potassium chloride (KLOR-CON) 8 MEQ tablet Take 2 tablets (16 mEq total) by mouth 2 (two) times daily.  . tadalafil (CIALIS) 5 MG tablet Take 5 mg by mouth daily as needed for erectile dysfunction.  . traMADol (ULTRAM) 50 MG tablet Take 1 tablet (50 mg total) by mouth 2 (two) times daily as needed. To fill on  Or after 12/31/2017  . traMADol (ULTRAM) 50 MG tablet Take 1 tablet (50 mg total) by mouth 2 (two) times daily as needed. 1st prescription To fill on or after 01/30/2018   No current facility-administered medications on file prior to visit.     Review of Systems: Constitutional: No fever, sweats, or shaking chills. No appetite or weight deficit.  Morbid obesity.  Decreased energy level. Skin: No rash, scaling, sores, lumps, or jaundice. HEENT: No visual changes or hearing deficit. Pulmonary: No unusual cough, sore throat, or orthopnea; DOE COPD; former smoker. Cardiovascular: No coronary artery disease, angina, or myocardial infarction.  No cardiac  dysrhythmia. essential hypertension and dyslipidemia. Gastrointestinal: No indigestion, dysphagia, abdominal pain, diarrhea; alternating loose stool and constipation (straining at stool).  BM 1-2 times weekly. No change in bowel habits. Genitourinary: No urinary frequency, urgency, hematuria, or dysuria; urinary retention; BPH; chlorthalidone recently increased. 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 Hematologic: No bleeding tendency or easy bruisability. Endocrine: No intolerance to heat or cold; no thyroid disease or diabetes mellitus. Vascular: No peripheral arterial or venous thromboembolic disease. Psychological: No anxiety, depression, or mood changes; former alcohol and substance abuse; former tobacco dependence. Neurological: Positional dizziness and lightheadedness; no syncope, or near syncopal episodes; no numbness or tingling in the fingers or toes.  Physical Examination: Vital Signs: Body surface area is 2.82 meters squared.  Vitals:   03/07/18 1441 03/07/18 1600  BP: (!) 180/92 (!) 156/82  Pulse: 82   Resp: 19   Temp: 98.6 F (37 C)   SpO2: 99%     Filed Weights   03/07/18 1441  Weight: (!) 344 lb 9.6 oz (156.3 kg)  ECOG PERFORMANCE STATUS:1 Constitutional:  Donevan Angelillo is a fully nourished and developed African-American albeit overweight.  He looks age appropriate.  He is friendly and cooperative without respiratory compromise at rest. Skin: No rashes,  scaling, dryness, jaundice, or itching. HEENT: Head is normocephalic and atraumatic.  Pupils are equal round and reactive to light and accommodation.  Sclerae are anicteric.  Conjunctivae are pink.  No sinus tenderness nor oropharyngeal lesions.  Lips without cracking or peeling; tongue without mass, inflammation, or nodularity.  Mucous membranes are moist.  Poor dentition. Neck: Supple and symmetric.  No jugular  venous distention or thyromegaly.  Trachea is midline. Lymphatics: No cervical or supraclavicular lymphadenopathy.  No epitrochlear, axillary, or inguinal lymphadenopathy is appreciated. Respiratory/chest: Thorax is symmetrical.  Breath sounds are clear to auscultation and percussion.  Normal excursion and respiratory effort. Back: Symmetric without deformity or tenderness. Cardiovascular: Heart rate and rhythm are regular. Gastrointestinal: Abdomen is soft, obese, nontender; no organomegaly.  Bowel sounds are normoactive.  No masses are appreciated. Extremities: In the lower extremities, there is no asymmetric swelling, erythema, tenderness, or cord formation.  No clubbing or cyanosis; trace bipedal edema. Hematologic: No petechiae, hematomas, or ecchymoses. Psychological:  He is oriented to person, place, and time; normal affect. Neurological: There are no gross neurologic deficits.  Laboratory Results: I have reviewed the data as listed: February 12, 2018  Ref Range & Units 12:37 7834moago 625yrgo [id number]  WBC Count 4.0 - 10.5 K/uL 9.0  9.9 R 8.0    RBC 4.22 - 5.81 MIL/uL 4.58  5.19 R 4.89    Hemoglobin 13.0 - 17.0 g/dL 12.4Low   13.9 R 14.0  15.6   HCT 39.0 - 52.0 % 37.5Low   41.0 R 39.3  46.0   MCV 80.0 - 100.0 fL 81.9  79 R 80.4 R   MCH 26.0 - 34.0 pg 27.1  26.8 R 28.6    MCHC 30.0 - 36.0 g/dL 33.1  33.9 R 35.6    RDW 11.5 - 15.5 % 16.2High   16.2High  R 14.3    Platelet Count 150 - 400 K/uL 160  181 R 150    nRBC 0.0 - 0.2 % 0.0      Neutrophils Relative % % 28      Neutro Abs 1.7 - 7.7 K/uL 2.6      Lymphocytes Relative % 59      Lymphs Abs 0.7 - 4.0 K/uL 5.3High       Monocytes Relative % 10      Monocytes Absolute 0.1 - 1.0 K/uL 0.9      Eosinophils Relative % 2      Eosinophils Absolute 0.0 - 0.5 K/uL 0.2      Basophils Relative % 1      Basophils Absolute 0.0 - 0.1 K/uL 0.1      Immature Granulocytes % 0      Abs Immature Granulocytes 0.00 - 0.07 K/uL 0.01        12:37 (02/12/18) 2wk ago (01/28/18) 2wk ago (01/24/18) 834mo26mo (12/05/17) 834mo 7834mo(11/27/17)    Sodium 135 - 145 mmol/L 140  138 R     Potassium 3.5 - 5.1 mmol/L 3.4Low   3.4Low  R 3.2Low  R    Chloride 98 - 111 mmol/L 107  102 R     CO2 22 - 32 mmol/L 26  21 R     Glucose, Bld 70 - 99 mg/dL 129High   167High  R     BUN 8 - 23 mg/dL 10  12 R     Creatinine, Ser 0.61 - 1.24 mg/dL 1.07  1.04 R     Calcium 8.9 - 10.3 mg/dL  11.1High   11.5High  R   11.5High  R  Total Protein 6.5 - 8.1 g/dL 7.8    7.7 R   Albumin 3.5 - 5.0 g/dL 3.8       AST 15 - 41 U/L 24       ALT 0 - 44 U/L 32       Alkaline Phosphatase 38 - 126 U/L 47       Total Bilirubin 0.3 - 1.2 mg/dL 0.5       GFR calc non Af Amer >60 mL/min >60  75 R     GFR calc Af Amer >60 mL/min >60  87 R     Comment: (NOTE)     Ref Range & Units 12:37  Retic Ct Pct 0.4 - 3.1 % 1.9   RBC. 4.22 - 5.81 MIL/uL 4.58   Retic Count, Absolute 19.0 - 186.0 K/uL 88.9   Immature Retic Fract 2.3 - 15.9 % 20.0High    Reticulocyte Hemoglobin >27.9 pg 32.8   Comment:     Given the high negative predictive  value of a RET-He result > 32 pg  iron deficiency is essentially  excluded. If this patient is anemic  other etiologies should be  considered.   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  Minimal subsegmental atelectasis at LEFT costophrenic angle.  Lungs otherwise clear.  No infiltrate, pleural effusion or  pneumothorax.  Bones unremarkable.  IMPRESSION: Minimal LEFT basilar subsegmental atelectasis.  Otherwise negative exam.  Lavonia Dana M.D. 01/25/2018 14:15  Summary/Assessment: In the setting of an abnormal serum protein electrophoresis, he presents now for the results of his preliminary laboratory studies and recommendations.  On 2018-02-02 with routine laboratory testing, his calcium was found to be elevated.  He work-up and evaluation to date revealed the PTHrpeptide was normal.  The parathyroid hormone was low normal range.  His renal function was normal.  A serum protein electrophoresis however showed an increase in beta globulin fraction.  His prior records from the [facility] were requested.  Likewise a two-view chest x-ray, vitamin D level, and 24-hour urine calcium was requested.  His alcohol intake in the past was very heavy especially while in the [employer]. He was in the [employer] from [phone]. While he was in [employer], he used marijuana and heroin.  He has since discontinued both.  His alcohol intake since 2011 consist of beer, sometimes twice weekly.  At the time that his initial visit, we had a detailed discussion with Melo regarding his previous laboratory studies, including the incomplete information on initial serum protein electrophoresis. A serum protein electrophoresis, immunofixation, and free kappa/lambda light chain analysis was performed.   Because of his mild hypercalcemia, relatively unchanged, it was recommended that thiazide diuretics, like chlorthalidone, be substituted for since they may decrease renal calcium excretion. Generally, they should be avoided with hypercalcemia.  1059 it is with this background he presents now for further diagnostic and therapeutic recommendations in the setting of an abnormal serum protein electrophoresis and  mild hypercalcemia as outlined above.  In the interim since his last visit, both his appetite  and weight remain stable. He has no visual changes or hearing deficit.  He has no headache, syncope, or near syncopal episodes. He does experience positional dizziness and lightheadedness. He denies rash or itching. He has no unusual cough, sore throat, orthopnea.  He denies dyspnea at rest.  He has chronic stable exertional dyspnea unchanged over his baseline.  There is no pain or difficulty in swallowing.  No fever, shaking chills, sweats, or flulike symptoms are evident.  He has infrequent episodes of dyspepsia associated with dietary indiscretion.  There is no nausea, vomiting, or diarrhea.  He does experience straining at stool and loose bowels generally once or twice a week. He has no melena or bright red blood per rectum. There are no new focal areas of bone, joint, muscle pain. He has no numbness or tingling in the fingers or toes.    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  Recommendation/Plan: We discussed in detail the results of his laboratory studies from November 12.  Those results are detailed above.  There is no convincing evidence of a monoclonal M spike on serum protein electrophoresis/immunofixation.  While the IgA immunoglobulin was mildly increased, the IgG and IgM are normal quantitatively.  A previous 24-hour urine was collected through Dr. Wynetta Emery.  It was for calcium and creatinine excretion. The 24-hour urine calcium was elevated at 391.7 (100-300). The urine creatinine was 2093 (1000-2000). Ionized calcium was 6.1 (4.5-5.6).  Phosphorus 2.7 and vitamin D 46.  A metastatic skeletal survey has been recommended as a baseline.  At the time of his  initial visit, it was recommended that he discuss with Dr. Wynetta Emery discontinuing chlorthalidone since thiazide diuretics decrease calcium excretion.  Unfortunately he has not spoken to Dr. Wynetta Emery.  He was again reminded to discuss switching to a loop diuretic as soon as possible. Likewise he was advised to minimize calcium-containing food products. Although he does not drink milk, he does use calcium-enriched butter on a daily basis.  The results of his complete blood count were reviewed and discussed in detail.  Although the total white count was normal at 9.9, there was a predominance of lymphocytes. His peripheral blood smear will be officially reviewed at the time of his next lab draw. Peripheral blood for immunophenotypic flow cytometry has been requested to exclude an underlying lymphoproliferative disorder.  Although he has mild anemia, his RBC indices are low-normal.  His RDW is elevated.  At the time of his next visit, both iron studies and nutritional indices (vitamin P32, folic acid) will be obtained.  Barring any unforeseen complications, his next scheduled doctor visit with laboratory studies is on December 30. At that time repeat protein studies will be obtained along with the above described iron studies, flow cytometry, vitamin R51, and folic acid levels.  He was advised that he will need a new provider at the time of his next visit since I am leaving the practice.  He was advised to call us in the interim should any new or untoward problems arise.  This note was dictated using voice activated technology/software.  Unfortunately, typographical errors are not uncommon, and transcription is subject to  mistakes and regrettably misinterpretation.  If necessary, clarification of the above information can be discussed with me at any time.  FOLLOW UP: AS DIRECTED   cc:             Karle Plumber, MD    Henreitta Leber, MD  Hematology/Oncology [facility] [address] [place], [place] [address] Office: [phone] ESPQ: [phone]

## 2018-03-11 ENCOUNTER — Ambulatory Visit: Payer: Medicare HMO | Admitting: Pharmacist

## 2018-03-11 DIAGNOSIS — M1711 Unilateral primary osteoarthritis, right knee: Secondary | ICD-10-CM | POA: Diagnosis not present

## 2018-03-11 DIAGNOSIS — M25561 Pain in right knee: Secondary | ICD-10-CM | POA: Diagnosis not present

## 2018-03-11 NOTE — Progress Notes (Deleted)
   S: PCP: Dr. Laural BenesJohnson     Patient arrives in good spirits. Presents to the clinic for hypertension management. Patient was referred by Dr. Laural BenesJohnson on 01/24/18. BP at that visit was 183/94. Hydralazine increased to 25 mg BID.  Today, pt denies CP, SOB, HA or blurred vision. Denies BLE edema.  Patient *** adherence with medications. Has taken this morning.  Current BP Medications include:   - amlodipine 10 mg daily - chlorthalidone 50 mg daily - Hydralazine 25 mg BID.  - losartan 100 mg daily  Dietary habits include:  - 2-3 drinks energy drinks/day (Red Bull)  - Reports eating 2 sausage McMuffins before visit today; does not limit salt Exercise habits include: - does not exercise Family / Social history:  - FHx: DM (father) - Tobacco: never smoker - Alcohol: reports drinking beer  ASCVD risk: ~48%  Home BP readings:  - does not check at home  O:  L arm after 5 minutes rest: 150/77, HR 86  Last 3 Office BP readings: BP Readings from Last 3 Encounters:  03/07/18 (!) 156/82  02/12/18 (!) 178/91  02/07/18 (!) 150/77   BMET    Component Value Date/Time   NA 140 02/12/2018 1237   NA 138 01/28/2018 1114   K 3.4 (L) 02/12/2018 1237   CL 107 02/12/2018 1237   CO2 26 02/12/2018 1237   GLUCOSE 129 (H) 02/12/2018 1237   BUN 10 02/12/2018 1237   BUN 12 01/28/2018 1114   CREATININE 1.07 02/12/2018 1237   CALCIUM 11.1 (H) 02/12/2018 1237   CALCIUM 11.0 (H) 02/12/2018 1237   GFRNONAA >60 02/12/2018 1237   GFRAA >60 02/12/2018 1237   Renal function: CrCl cannot be calculated (Patient's most recent lab result is older than the maximum 21 days allowed.).  A/P: Hypertension longstanding currently uncontrolled on current medications. BP Goal <130/80 mmHg. Patient is not adherent with current medications.  -Continued anti-hypertensives.  -Encouraged adherence to the evening dose of hydralazine -Take amlodipine in the evening -Chlorthalidone, AM dose of hydralazine, and  losartan in the morning -Counseled on lifestyle modifications for blood pressure control including reduced dietary sodium, increased exercise, adequate sleep  Results reviewed and written information provided. Total time in face-to-face counseling 15 minutes.   F/U Clinic Visit w/ PCP  Austin Mercer, PharmD, CPP Clinical Pharmacist [facility] [phone]

## 2018-03-12 ENCOUNTER — Telehealth: Payer: Self-pay | Admitting: Internal Medicine

## 2018-03-13 ENCOUNTER — Telehealth: Payer: Self-pay | Admitting: Internal Medicine

## 2018-03-13 NOTE — Telephone Encounter (Signed)
Pt came in to request advice on his BP medications including -hydrALAZINE (APRESOLINE) 25 MG tablet  Please advise on what he should be taking for BP  [phone]

## 2018-03-13 NOTE — Telephone Encounter (Signed)
Pt reports that he was told to d/c Hydralazine by his other doctor, at first he was unsure who the provider was, I reminded him of his appt last week with Dr. Caron Presumeuben. He said that is who told him he needed to stop taking one of his BP meds and the provider "didn't understand why he was still taking it" Mr. Austin Mercer told me he stopped taking the hydralazine and was only taking "the [facility]" I am not sure which meds those may be and the patient was not at home so he was unable to name them. He was quite upset, he seemed to think that his doctor was reaching out to us and was upset he had not received a response yet. I told him we would look into it and reach back out to him with a resolution.  After looking at the notes from his 03/07/18 appt with Dr. Caron Presumeuben and speaking with our CPP the situation became clear. Due to his hypercalcemia Dr. Caron Presumeuben recommended replacing the patients Chlorthalidone. The patient was scheduled to see Franky MachoLuke on Monday but he was a no-show. Franky MachoLuke said that due to the circumstances he feels the patient should be seen by his PCP to have this addressed.  Please inform the patient which medication(s) should be discontinued and if he should make an appointment to be seen prior to replacing his chlorthalidone.

## 2018-03-13 NOTE — Telephone Encounter (Signed)
Could you follow up pt

## 2018-03-14 NOTE — Telephone Encounter (Signed)
Called the patient he will be here next Friday the 20th at 10:50

## 2018-03-22 ENCOUNTER — Ambulatory Visit: Payer: Medicare HMO | Attending: Internal Medicine | Admitting: Internal Medicine

## 2018-03-22 ENCOUNTER — Encounter: Payer: Self-pay | Admitting: Internal Medicine

## 2018-03-22 DIAGNOSIS — M1711 Unilateral primary osteoarthritis, right knee: Secondary | ICD-10-CM | POA: Insufficient documentation

## 2018-03-22 DIAGNOSIS — Z7984 Long term (current) use of oral hypoglycemic drugs: Secondary | ICD-10-CM | POA: Insufficient documentation

## 2018-03-22 DIAGNOSIS — Z79899 Other long term (current) drug therapy: Secondary | ICD-10-CM | POA: Insufficient documentation

## 2018-03-22 DIAGNOSIS — Z6841 Body Mass Index (BMI) 40.0 and over, adult: Secondary | ICD-10-CM | POA: Insufficient documentation

## 2018-03-22 DIAGNOSIS — N529 Male erectile dysfunction, unspecified: Secondary | ICD-10-CM | POA: Diagnosis not present

## 2018-03-22 DIAGNOSIS — F1190 Opioid use, unspecified, uncomplicated: Secondary | ICD-10-CM | POA: Insufficient documentation

## 2018-03-22 DIAGNOSIS — E119 Type 2 diabetes mellitus without complications: Secondary | ICD-10-CM | POA: Diagnosis not present

## 2018-03-22 DIAGNOSIS — I10 Essential (primary) hypertension: Secondary | ICD-10-CM | POA: Diagnosis not present

## 2018-03-22 DIAGNOSIS — Z8619 Personal history of other infectious and parasitic diseases: Secondary | ICD-10-CM | POA: Insufficient documentation

## 2018-03-22 DIAGNOSIS — Z87891 Personal history of nicotine dependence: Secondary | ICD-10-CM | POA: Insufficient documentation

## 2018-03-22 MED ORDER — FUROSEMIDE 20 MG PO TABS: 20.0000 mg | ORAL_TABLET | Freq: Every day | ORAL | 3 refills | Status: DC

## 2018-03-22 MED ORDER — TADALAFIL 10 MG PO TABS: ORAL_TABLET | ORAL | 3 refills | Status: DC

## 2018-03-22 MED ORDER — CARVEDILOL 3.125 MG PO TABS: 3.1250 mg | ORAL_TABLET | Freq: Two times a day (BID) | ORAL | 2 refills | Status: DC

## 2018-03-22 MED FILL — FUROSEMIDE 20 MG TABLET: 20 | 30 days supply | Qty: 30 | Fill #0

## 2018-03-22 MED FILL — CARVEDILOL 3.125 MG TABLET: 3.125 | 30 days supply | Qty: 60 | Fill #0

## 2018-03-22 NOTE — Patient Instructions (Signed)
Stop chlorthalidone.  We have added a low dose of a different fluid pill called furosemide which she will take once a day.  Stop hydralazine. Start carvedilol instead.

## 2018-03-22 NOTE — Progress Notes (Signed)
Patient ID: Austin Mercer, male    DOB: [birth year]  MRN: [id number]  CC: Follow-up hypercalcemia  Subjective: Austin Mercer is a 66 y.o. male who presents for urgent care visit for follow-up on hypercalcemia His concerns today include:  Pt gives hx of severe RT OA of knee, lower back pain x 1 yr (had x-ray that showed arthritis), HTN, DM, hep C (treated at [place], cured), ED.  HyperCalcemia: Since last visit he has seen the hematologist Dr. Audelia Hives.  He reports that while the IgA immunoglobulin was mildly increased, the IgG and IgM were quantitatively normal.  He does not feel there is any convincing evidence of a monoclonal M spike.  He recommends stopping the thiazide diuretic to see whether the calcium level decreases or returns to normal.  Patient has a follow-up appointment with him later this month.  HTN: Patient has all of his medicines with him today except the bottle of chlorthalidone.  He tells me that he is taking all of his blood pressure medications as prescribed.  However he last picked up hydralazine 01/24/2018 for a 1 month supply. He limit salt in the foods. He has a device at home but does not check blood pressure  OA RT knee: He tells me that he has been going to the [facility] to get injections Q wk. he reports that the injections have been working well for him and he has not had to use the tramadol as much.  DM: last eye exam 10/2016.  Wears glasses. No change in vision  ED: Wanting to try something different for erectile dysfunction.  He reports that he gets partial results with the 5 mg of Cialis. Patient Active Problem List   Diagnosis Date Noted  . Positive for macroalbuminuria 01/27/2018  . Hypercalcemia 01/24/2018  . Controlled type 2 diabetes mellitus without complication, without long-term current use of insulin ([place]) 11/23/2017  . Essential hypertension 11/23/2017  . Primary osteoarthritis of right knee 11/23/2017  . Class 3 severe obesity due to excess  calories with serious comorbidity and body mass index (BMI) of 45.0 to 49.9 in adult ([place]) 11/23/2017  . Age-related cataract of right eye 11/23/2017  . Hepatitis C virus infection cured after antiviral drug therapy 11/23/2017  . Chronic, continuous use of opioids 11/23/2017     Current Outpatient Medications on File Prior to Visit  Medication Sig Dispense Refill  . ACCU-CHEK FASTCLIX LANCETS MISC Use as directed to test blood sugar once daily. DX: E11.9 100 each 12  . amLODipine (NORVASC) 10 MG tablet Take 1 tablet (10 mg total) by mouth daily. 90 tablet 1  . atorvastatin (LIPITOR) 10 MG tablet Take 1 tablet (10 mg total) by mouth daily. 90 tablet 3  . Blood Glucose Monitoring Suppl (ACCU-CHEK GUIDE) w/Device KIT 1 each by Does not apply route daily. Use as directed to test blood sugar once daily. DX: E11.9 1 kit 0  . diclofenac sodium (VOLTAREN) 1 % GEL Apply 2 g topically 4 (four) times daily. 100 g 3  . glucose blood (ACCU-CHEK GUIDE) test strip Use as directed to test blood sugar once daily. DX: E11.9 100 each 12  . losartan (COZAAR) 100 MG tablet Take 1 tablet (100 mg total) by mouth daily. 90 tablet 1  . metFORMIN (GLUCOPHAGE) 1000 MG tablet Take 1 tablet (1,000 mg total) by mouth 2 (two) times daily with a meal. 180 tablet 1  . potassium chloride (KLOR-CON) 8 MEQ tablet Take 2 tablets (16 mEq total) by  mouth 2 (two) times daily. 120 tablet 6  . traMADol (ULTRAM) 50 MG tablet Take 1 tablet (50 mg total) by mouth 2 (two) times daily as needed. To fill on  Or after 12/31/2017 60 tablet 0  . traMADol (ULTRAM) 50 MG tablet Take 1 tablet (50 mg total) by mouth 2 (two) times daily as needed. 1st prescription To fill on or after 01/30/2018 60 tablet 1   No current facility-administered medications on file prior to visit.     Allergies  Allergen Reactions  . Latex Itching    Social History   Socioeconomic History  . Marital status: Divorced    Spouse name: Not on file  . Number of  children: 2  . Years of education: Not on file  . Highest education level: Not on file  Occupational History  . Occupation: retired  Scientific laboratory technician  . Financial resource strain: Not on file  . Food insecurity:    Worry: Not on file    Inability: Not on file  . Transportation needs:    Medical: Not on file    Non-medical: Not on file  Tobacco Use  . Smoking status: Former Smoker    Last attempt to quit: 1973    Years since quitting: 46.9  . Smokeless tobacco: Never Used  Substance and Sexual Activity  . Alcohol use: Yes    Comment: Beer, Red wine  . Drug use: No  . Sexual activity: Not on file  Lifestyle  . Physical activity:    Days per week: Not on file    Minutes per session: Not on file  . Stress: Not on file  Relationships  . Social connections:    Talks on phone: Not on file    Gets together: Not on file    Attends religious service: Not on file    Active member of club or organization: Not on file    Attends meetings of clubs or organizations: Not on file    Relationship status: Not on file  . Intimate partner violence:    Fear of current or ex partner: Not on file    Emotionally abused: Not on file    Physically abused: Not on file    Forced sexual activity: Not on file  Other Topics Concern  . Not on file  Social History Narrative  . Not on file    Family History  Problem Relation Age of Onset  . Diabetes Father     Past Surgical History:  Procedure Laterality Date  . ANKLE FRACTURE SURGERY      ROS: Review of Systems Negative except as above. PHYSICAL EXAM: BP (!) 163/81   Pulse 76   Temp 98 F (36.7 C) (Oral)   Ht 6' (1.829 m)   Wt (!) 346 lb 12.8 oz (157.3 kg)   SpO2 97%   BMI 47.03 kg/m   Wt Readings from Last 3 Encounters:  03/22/18 (!) 346 lb 12.8 oz (157.3 kg)  03/07/18 (!) 344 lb 9.6 oz (156.3 kg)  02/12/18 (!) 347 lb 8 oz (157.6 kg)    Physical Exam  General appearance - alert, well appearing, and in no distress Mental  status -patient very talkative.   Chest - clear to auscultation, no wheezes, rales or rhonchi, symmetric air entry Heart - normal rate, regular rhythm, normal S1, S2, no murmurs, rubs, clicks or gallops Extremities -trace bilateral lower extremity edema.  ASSESSMENT AND PLAN: 1. Hypercalcemia We will have him stop the chlorthalidone.  2.  Essential hypertension Not at goal.  It appears he has not been taking hydralazine consistently.  However given the lower extremity edema, I will have him discontinue the hydralazine.  Start low-dose furosemide and low-dose carvedilol - furosemide (LASIX) 20 MG tablet; Take 1 tablet (20 mg total) by mouth daily.  Dispense: 30 tablet; Refill: 3 - carvedilol (COREG) 3.125 MG tablet; Take 1 tablet (3.125 mg total) by mouth 2 (two) times daily with a meal.  Dispense: 60 tablet; Refill: 2  3. Controlled type 2 diabetes mellitus without complication, without long-term current use of insulin ([place]) - Ambulatory referral to Ophthalmology  4. Erectile dysfunction of organic origin Increase Cialis to 10 mg daily - tadalafil (CIALIS) 10 MG tablet; 1 tab PO Q 1 hr prior to sex  Dispense: 20 tablet; Refill: 3  Patient was given the opportunity to ask questions.  Patient verbalized understanding of the plan and was able to repeat key elements of the plan.   Orders Placed This Encounter  Procedures  . Ambulatory referral to Ophthalmology     Requested Prescriptions   Signed Prescriptions Disp Refills  . tadalafil (CIALIS) 10 MG tablet 20 tablet 3    Sig: 1 tab PO Q 1 hr prior to sex  . furosemide (LASIX) 20 MG tablet 30 tablet 3    Sig: Take 1 tablet (20 mg total) by mouth daily.  . carvedilol (COREG) 3.125 MG tablet 60 tablet 2    Sig: Take 1 tablet (3.125 mg total) by mouth 2 (two) times daily with a meal.    No follow-ups on file.  Karle Plumber, MD, FACP

## 2018-03-25 MED FILL — TADALAFIL 10 MG TABS: 10 | 15 days supply | Qty: 5 | Fill #0

## 2018-03-26 ENCOUNTER — Telehealth: Payer: Self-pay | Admitting: Internal Medicine

## 2018-03-26 NOTE — Telephone Encounter (Signed)
RR out - moved 12/30 f/u from [facility] to 1/10 with [facility]. Spoke with patient.

## 2018-03-27 ENCOUNTER — Emergency Department (HOSPITAL_COMMUNITY)
Admission: EM | Admit: 2018-03-27 | Discharge: 2018-03-27 | Disposition: A | Discharge status: Home / Self Care | Payer: Medicare HMO | Attending: Emergency Medicine | Admitting: Emergency Medicine

## 2018-03-27 ENCOUNTER — Encounter (HOSPITAL_COMMUNITY): Payer: Self-pay

## 2018-03-27 ENCOUNTER — Other Ambulatory Visit: Payer: Self-pay

## 2018-03-27 DIAGNOSIS — E119 Type 2 diabetes mellitus without complications: Secondary | ICD-10-CM | POA: Diagnosis not present

## 2018-03-27 DIAGNOSIS — Z87891 Personal history of nicotine dependence: Secondary | ICD-10-CM | POA: Diagnosis not present

## 2018-03-27 DIAGNOSIS — F10930 Alcohol use, unspecified with withdrawal, uncomplicated: Secondary | ICD-10-CM

## 2018-03-27 DIAGNOSIS — Z79899 Other long term (current) drug therapy: Secondary | ICD-10-CM | POA: Insufficient documentation

## 2018-03-27 DIAGNOSIS — I10 Essential (primary) hypertension: Secondary | ICD-10-CM | POA: Diagnosis not present

## 2018-03-27 DIAGNOSIS — R251 Tremor, unspecified: Secondary | ICD-10-CM | POA: Diagnosis not present

## 2018-03-27 DIAGNOSIS — Z7984 Long term (current) use of oral hypoglycemic drugs: Secondary | ICD-10-CM | POA: Insufficient documentation

## 2018-03-27 DIAGNOSIS — F10230 Alcohol dependence with withdrawal, uncomplicated: Secondary | ICD-10-CM | POA: Diagnosis not present

## 2018-03-27 DIAGNOSIS — Z91040 Latex allergy status: Secondary | ICD-10-CM | POA: Diagnosis not present

## 2018-03-27 LAB — COMPREHENSIVE METABOLIC PANEL
ALT: 26 U/L (ref 0–44)
AST: 27 U/L (ref 15–41)
Albumin: 3.9 g/dL (ref 3.5–5.0)
Alkaline Phosphatase: 41 U/L (ref 38–126)
Anion gap: 12 (ref 5–15)
BUN: 12 mg/dL (ref 8–23)
CO2: 23 mmol/L (ref 22–32)
Calcium: 10.6 mg/dL — ABNORMAL HIGH (ref 8.9–10.3)
Chloride: 101 mmol/L (ref 98–111)
Creatinine, Ser: 1.16 mg/dL (ref 0.61–1.24)
GFR calc Af Amer: >60 mL/min (ref >60)
GFR calc non Af Amer: >60 mL/min (ref >60)
Glucose, Bld: 160 mg/dL — ABNORMAL HIGH (ref 70–99)
Potassium: 3.4 mmol/L — ABNORMAL LOW (ref 3.5–5.1)
Sodium: 136 mmol/L (ref 135–145)
Total Bilirubin: 0.7 mg/dL (ref 0.3–1.2)
Total Protein: 7.6 g/dL (ref 6.5–8.1)

## 2018-03-27 LAB — CBC WITH DIFFERENTIAL/PLATELET
Abs Immature Granulocytes: 0.02 10*3/uL (ref 0.00–0.07)
Basophils Absolute: 0.1 10*3/uL (ref 0.0–0.1)
Basophils Relative: 1 %
Eosinophils Absolute: 0.2 10*3/uL (ref 0.0–0.5)
Eosinophils Relative: 2 %
HCT: 40.9 % (ref 39.0–52.0)
Hemoglobin: 13.1 g/dL (ref 13.0–17.0)
Immature Granulocytes: 0 %
Lymphocytes Relative: 61 %
Lymphs Abs: 5.2 10*3/uL — ABNORMAL HIGH (ref 0.7–4.0)
MCH: 25.9 pg — ABNORMAL LOW (ref 26.0–34.0)
MCHC: 32 g/dL (ref 30.0–36.0)
MCV: 80.8 fL (ref 80.0–100.0)
Monocytes Absolute: 0.8 10*3/uL (ref 0.1–1.0)
Monocytes Relative: 10 %
Neutro Abs: 2.2 10*3/uL (ref 1.7–7.7)
Neutrophils Relative %: 26 %
Platelets: 195 10*3/uL (ref 150–400)
RBC: 5.06 MIL/uL (ref 4.22–5.81)
RDW: 14.8 % (ref 11.5–15.5)
WBC: 8.5 10*3/uL (ref 4.0–10.5)
nRBC: 0 % (ref 0.0–0.2)

## 2018-03-27 LAB — ETHANOL: Alcohol, Ethyl (B): <10 mg/dL (ref <10)

## 2018-03-27 MED ORDER — LORAZEPAM 1 MG PO TABS: 1.0000 mg | ORAL_TABLET | Freq: Once | ORAL | Status: DC

## 2018-03-27 MED ORDER — CHLORDIAZEPOXIDE HCL 25 MG PO CAPS: ORAL_CAPSULE | ORAL | 0 refills | Status: DC

## 2018-03-27 MED ORDER — SODIUM CHLORIDE 0.9 % IV BOLUS
1000.0000 mL | Freq: Once | INTRAVENOUS | Status: AC
  Administered 2018-03-27: 1000 mL via INTRAVENOUS

## 2018-03-27 NOTE — Discharge Instructions (Signed)
Take librium for alcohol withdrawal symptoms such as tremors and nervousness   See your doctor   Stop drinking alcohol   Continue your current meds   Return to ER if you have worse tremors, seizures, fall, head injury.

## 2018-03-27 NOTE — ED Triage Notes (Signed)
Ptc/o intermittent tremors that started this morning. Per pt, tremors last a few seconds each episode.

## 2018-03-27 NOTE — ED Provider Notes (Signed)
[facility] EMERGENCY DEPARTMENT Provider Note   CSN: [id number] Arrival date & time: 03/27/18  0840     History   Chief Complaint Chief Complaint  Patient presents with  . Tremors    HPI Flynn Gram is a 67 y.o. male history of hypertension, hypercalcemia, here presenting with tremors.  Patient did admit to drinking some alcohol yesterday.  Used to be a heavy drinker but now only drinks beers occasionally.  He did drink more alcohol yesterday.  He woke up this morning with a glass in his hand and he noticed that he is tremulous.  He states that the tremors lasted several seconds at that time.  Denies urinating on himself or any history of seizures.  Patient denies history of alcohol withdrawal seizures requiring admission.  Patient was recently seen by hematology for his hypercalcemia and his medicines were adjusted.  He also had extensive work-up including normal parathyroid hormone, no obvious myeloma on electrophoresis.   The history is provided by the patient.    Past Medical History:  Diagnosis Date  . Hypertension     Patient Active Problem List   Diagnosis Date Noted  . Positive for macroalbuminuria 01/27/2018  . Hypercalcemia 01/24/2018  . Controlled type 2 diabetes mellitus without complication, without long-term current use of insulin ([place]) 11/23/2017  . Essential hypertension 11/23/2017  . Primary osteoarthritis of right knee 11/23/2017  . Class 3 severe obesity due to excess calories with serious comorbidity and body mass index (BMI) of 45.0 to 49.9 in adult ([place]) 11/23/2017  . Age-related cataract of right eye 11/23/2017  . Hepatitis C virus infection cured after antiviral drug therapy 11/23/2017  . Chronic, continuous use of opioids 11/23/2017    Past Surgical History:  Procedure Laterality Date  . ANKLE FRACTURE SURGERY          Home Medications    Prior to Admission medications   Medication Sig Start Date End Date Taking?  Authorizing Provider  amLODipine (NORVASC) 10 MG tablet Take 1 tablet (10 mg total) by mouth daily. 11/23/17  Yes Ladell Pier, MD  atorvastatin (LIPITOR) 10 MG tablet Take 1 tablet (10 mg total) by mouth daily. 11/25/17  Yes Ladell Pier, MD  carvedilol (COREG) 3.125 MG tablet Take 1 tablet (3.125 mg total) by mouth 2 (two) times daily with a meal. 03/22/18  Yes Ladell Pier, MD  diclofenac sodium (VOLTAREN) 1 % GEL Apply 2 g topically 4 (four) times daily. Patient taking differently: Apply 2 g topically 4 (four) times daily as needed.  11/23/17  Yes Ladell Pier, MD  furosemide (LASIX) 20 MG tablet Take 1 tablet (20 mg total) by mouth daily. 03/22/18  Yes Ladell Pier, MD  losartan (COZAAR) 100 MG tablet Take 1 tablet (100 mg total) by mouth daily. 11/23/17  Yes Ladell Pier, MD  metFORMIN (GLUCOPHAGE) 1000 MG tablet Take 1 tablet (1,000 mg total) by mouth 2 (two) times daily with a meal. 11/23/17  Yes Ladell Pier, MD  potassium chloride (KLOR-CON) 8 MEQ tablet Take 2 tablets (16 mEq total) by mouth 2 (two) times daily. 01/25/18  Yes Ladell Pier, MD  tadalafil (CIALIS) 10 MG tablet 1 tab PO Q 1 hr prior to sex 03/22/18  Yes Ladell Pier, MD  traMADol (ULTRAM) 50 MG tablet Take 1 tablet (50 mg total) by mouth 2 (two) times daily as needed. To fill on  Or after 12/31/2017 11/23/17  Yes Ladell Pier,  MD  ACCU-CHEK FASTCLIX LANCETS MISC Use as directed to test blood sugar once daily. DX: E11.9 03/05/18   Ladell Pier, MD  Blood Glucose Monitoring Suppl (ACCU-CHEK GUIDE) w/Device KIT 1 each by Does not apply route daily. Use as directed to test blood sugar once daily. DX: E11.9 03/05/18   Ladell Pier, MD  glucose blood (ACCU-CHEK GUIDE) test strip Use as directed to test blood sugar once daily. DX: E11.9 03/05/18   Ladell Pier, MD  traMADol (ULTRAM) 50 MG tablet Take 1 tablet (50 mg total) by mouth 2 (two) times daily as needed. 1st  prescription To fill on or after 01/30/2018 Patient not taking: Reported on 03/27/2018 01/21/18   Ladell Pier, MD    Family History Family History  Problem Relation Age of Onset  . Diabetes Father     Social History Social History   Tobacco Use  . Smoking status: Former Smoker    Last attempt to quit: 1973    Years since quitting: 47.0  . Smokeless tobacco: Never Used  Substance Use Topics  . Alcohol use: Yes    Comment: Beer, Red wine  . Drug use: No     Allergies   Latex   Review of Systems Review of Systems  Neurological: Positive for tremors.  All other systems reviewed and are negative.    Physical Exam Updated Vital Signs BP (!) 149/89 (BP Location: Right Arm)   Pulse 95   Temp 98.1 F (36.7 C) (Oral)   Resp 18   Ht 6' (1.829 m)   Wt (!) 157.3 kg   SpO2 100%   BMI 47.03 kg/m   Physical Exam Vitals signs and nursing note reviewed.  Constitutional:      Appearance: Normal appearance.  HENT:     Head: Normocephalic.     Right Ear: Tympanic membrane normal.     Left Ear: Tympanic membrane normal.     Nose: Nose normal.     Mouth/Throat:     Mouth: Mucous membranes are moist.  Eyes:     Pupils: Pupils are equal, round, and reactive to light.  Neck:     Musculoskeletal: Normal range of motion.  Cardiovascular:     Rate and Rhythm: Normal rate and regular rhythm.  Pulmonary:     Effort: Pulmonary effort is normal.     Breath sounds: Normal breath sounds.  Abdominal:     General: Abdomen is flat.     Palpations: Abdomen is soft.  Musculoskeletal: Normal range of motion.  Skin:    General: Skin is warm.     Capillary Refill: Capillary refill takes less than 2 seconds.  Neurological:     General: No focal deficit present.     Mental Status: He is alert and oriented to person, place, and time.     Comments: CN 2- 12 intact, some resting tremors that resolved with movement. Nl finger to nose. Stable gait. Nl strength and sensation  throughout   Psychiatric:        Mood and Affect: Mood normal.      ED Treatments / Results  Labs (all labs ordered are listed, but only abnormal results are displayed) Labs Reviewed  CBC WITH DIFFERENTIAL/PLATELET - Abnormal; Notable for the following components:      Result Value   MCH 25.9 (*)    Lymphs Abs 5.2 (*)    All other components within normal limits  COMPREHENSIVE METABOLIC PANEL - Abnormal; Notable for the following  components:   Potassium 3.4 (*)    Glucose, Bld 160 (*)    Calcium 10.6 (*)    All other components within normal limits  ETHANOL  URINALYSIS, ROUTINE W REFLEX MICROSCOPIC    EKG None  Radiology No results found.  Procedures Procedures (including critical care time)  Medications Ordered in ED Medications  sodium chloride 0.9 % bolus 1,000 mL (1,000 mLs Intravenous New Bag/Given 03/27/18 0915)     Initial Impression / Assessment and Plan / ED Course  I have reviewed the triage vital signs and the nursing notes.  Pertinent labs & imaging results that were available during my care of the patient were reviewed by me and considered in my medical decision making (see chart for details).    Austin Mercer is a 67 y.o. male here with tremors. He admits to drinking some alcohol last night. Consider alcohol withdrawal. He has hx of hypercalcemia so will check chemistry as well.   10:29 AM Patient's labs showed Calcium of 10.6, improved from previous. No tremors after IVF. ETOH negative. Likely mild alcohol withdrawal. Awake and alert, eating sandwich. Told him to stop drinking alcohol. Will try librium taper. Given resources for alcohol      Final Clinical Impressions(s) / ED Diagnoses   Final diagnoses:  None    ED Discharge Orders    None       Drenda Freeze, MD 03/27/18 1031

## 2018-03-27 NOTE — ED Notes (Signed)
Per Dr. Silverio LayYao ok to discharge without urine sample

## 2018-03-29 MED FILL — ATORVASTATIN 10 MG TABLET: 10 | 30 days supply | Qty: 30 | Fill #1

## 2018-04-01 ENCOUNTER — Ambulatory Visit: Payer: Medicare HMO | Admitting: Nurse Practitioner

## 2018-04-05 ENCOUNTER — Telehealth: Payer: Self-pay | Admitting: Internal Medicine

## 2018-04-05 DIAGNOSIS — M1711 Unilateral primary osteoarthritis, right knee: Secondary | ICD-10-CM

## 2018-04-05 MED ORDER — TRAMADOL HCL 50 MG PO TABS: 50.0000 mg | ORAL_TABLET | Freq: Two times a day (BID) | ORAL | 0 refills | Status: DC | PRN

## 2018-04-05 MED ORDER — TRAMADOL HCL 50 MG PO TABS: 50.0000 mg | ORAL_TABLET | Freq: Two times a day (BID) | ORAL | 1 refills | Status: DC | PRN

## 2018-04-05 NOTE — Telephone Encounter (Signed)
Patient requested refill on tramadol.  [place] controlled substance reporting system reviewed and is appropriate.  2 prescriptions generated.

## 2018-04-05 NOTE — Telephone Encounter (Signed)
1) Medication(s) Requested (by name): °tramadol °2) Pharmacy of Choice: ° °[facility] ° °

## 2018-04-05 NOTE — Telephone Encounter (Signed)
Patient would like for someone from the FO to follow up with any updates regarding the medication refill request.

## 2018-04-05 NOTE — Telephone Encounter (Signed)
Contacted pt and made aware that rx's are ready for picl up at the front office

## 2018-04-08 MED FILL — traMADol HCL 50 MG TABS: 50 | 30 days supply | Qty: 60 | Fill #0

## 2018-04-12 ENCOUNTER — Encounter: Payer: Self-pay | Admitting: Internal Medicine

## 2018-04-12 ENCOUNTER — Telehealth: Payer: Self-pay | Admitting: Internal Medicine

## 2018-04-12 ENCOUNTER — Inpatient Hospital Stay: Payer: Medicare HMO | Attending: Hematology and Oncology | Admitting: Internal Medicine

## 2018-04-12 DIAGNOSIS — R778 Other specified abnormalities of plasma proteins: Secondary | ICD-10-CM | POA: Diagnosis not present

## 2018-04-12 DIAGNOSIS — B1920 Unspecified viral hepatitis C without hepatic coma: Secondary | ICD-10-CM | POA: Insufficient documentation

## 2018-04-12 DIAGNOSIS — Z79899 Other long term (current) drug therapy: Secondary | ICD-10-CM | POA: Insufficient documentation

## 2018-04-12 DIAGNOSIS — Z87891 Personal history of nicotine dependence: Secondary | ICD-10-CM | POA: Insufficient documentation

## 2018-04-12 DIAGNOSIS — I10 Essential (primary) hypertension: Secondary | ICD-10-CM

## 2018-04-12 DIAGNOSIS — E119 Type 2 diabetes mellitus without complications: Secondary | ICD-10-CM | POA: Insufficient documentation

## 2018-04-12 DIAGNOSIS — Z7984 Long term (current) use of oral hypoglycemic drugs: Secondary | ICD-10-CM | POA: Diagnosis not present

## 2018-04-12 NOTE — Telephone Encounter (Signed)
Printed calendar and avs. °

## 2018-04-12 NOTE — Progress Notes (Signed)
Diagnosis Hypercalcemia - Plan: CBC with Differential/Platelet, Comprehensive metabolic panel, Lactate dehydrogenase, Protein electrophoresis, serum, Ferritin, Calcium, ionized  Abnormal serum protein electrophoresis - Plan: CBC with Differential/Platelet, Comprehensive metabolic panel, Lactate dehydrogenase, Protein electrophoresis, serum, Ferritin, Calcium, ionized  Staging Cancer Staging No matching staging information was found for the patient.  Assessment and Plan:  1.  Reportedly abnormal SPEP.  Labs done 02/12/2018 reviewed and showed WBC 9 HB 12.4 plts 160,000.  Chemistries WNL with K+ 3.4 Cr 1.07 and normal LFTs.  Ionized calcium level 6.  SPEP negative. IFIX shows polyclonal gammopathy.  FLC ratio WNL and normal quant IG.    I discussed with pt SPEP is normal.  He has evidence of polyclonal gammopathy which is not consistent with Myeloma.  Pt will RTC in 6 months with repeat SPEP and if at that time findings are negative he may be assigned to prn follow-up.    2.  Hypercalcemia.  Calcium level mildly elevated at 6.   Dr. Audelia Hives previously recommended that thiazide diuretics, like chlorthalidone, be substituted for since they may decrease renal calcium excretion. Generally, they should be avoided with hypercalcemia. Pt should follow-up with PCP for ongoing management and work-up if necessary.    3.  Hepatitis C.  Pt was reportedly previously treated for hepatitis C in 2018 at the [facility] in [place].  He is a reprted former alcohol and substance abuser.  Pt should follow-up with PCP and GI as directed.    4.  HTN.  BP is 170/90.  Follow-up with PCP for management.    Interval History:  Historical data obtained from noted dated 03/07/2018.  68 year old male previously followed by Dr. Audelia Hives for reported abnormal SPEP.  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  His primary care provider is Dr. Karle Plumber.   On January 24, 2018 with routine laboratory testing, his calcium was found to be elevated.  A work-up and evaluation to date revealed the PTHrpeptide was normal.  The parathyroid hormone was low-normal range.  His renal function was normal.  A serum protein electrophoresis however showed an increase in beta globulin fraction.  His prior records from the [facility] were requested.  Likewise a two-view chest x-ray, vitamin D level, and 24-hour urine calcium were requested.  His alcohol intake in the past was very heavy especially while in the [employer]. He was in the [employer] from [phone]. While he was in [employer], he used marijuana and heroin.  He has since discontinued both.  His alcohol intake since 2011 consist of beer, sometimes twice weekly.  At the time that his initial visit, we had a detailed discussion with Austin Mercer regarding his previous laboratory studies, including the incomplete information on initial serum protein electrophoresis. A serum protein electrophoresis, immunofixation, and free kappa/lambda light chain analysis was performed.   Because of his mild hypercalcemia, relatively unchanged, it was recommended that thiazide diuretics, like chlorthalidone, be substituted for since they may decrease renal calcium excretion. Generally, they should be avoided with hypercalcemia.   Current Status:  Pt is seen today for follow-up to go over labs.    Problem List Patient Active Problem List   Diagnosis Date Noted  . Positive for macroalbuminuria [R80.9]  01/27/2018  . Hypercalcemia [E83.52] 01/24/2018  . Controlled type 2 diabetes mellitus without complication, without long-term current  use of insulin ([place]) [E11.9] 11/23/2017  . Essential hypertension [I10] 11/23/2017  . Primary osteoarthritis of right knee [M17.11] 11/23/2017  . Class 3 severe obesity due to excess calories with serious comorbidity and body mass index (BMI) of 45.0 to 49.9 in adult ([place]) [E66.01, Z68.42] 11/23/2017  . Age-related cataract of right eye [H25.9] 11/23/2017  . Hepatitis C virus infection cured after antiviral drug therapy [Z86.19] 11/23/2017  . Chronic, continuous use of opioids [F11.90] 11/23/2017    Past Medical History Past Medical History:  Diagnosis Date  . Hypertension     Past Surgical History Past Surgical History:  Procedure Laterality Date  . ANKLE FRACTURE SURGERY      Family History Family History  Problem Relation Age of Onset  . Diabetes Father      Social History  reports that he quit smoking about 47 years ago. He has never used smokeless tobacco. He reports current alcohol use. He reports previous drug use.  Medications  Current Outpatient Medications:  .  ACCU-CHEK FASTCLIX LANCETS MISC, Use as directed to test blood sugar once daily. DX: E11.9, Disp: 100 each, Rfl: 12 .  amLODipine (NORVASC) 10 MG tablet, Take 1 tablet (10 mg total) by mouth daily., Disp: 90 tablet, Rfl: 1 .  atorvastatin (LIPITOR) 10 MG tablet, Take 1 tablet (10 mg total) by mouth daily., Disp: 90 tablet, Rfl: 3 .  Blood Glucose Monitoring Suppl (ACCU-CHEK GUIDE) w/Device KIT, 1 each by Does not apply route daily. Use as directed to test blood sugar once daily. DX: E11.9, Disp: 1 kit, Rfl: 0 .  carvedilol (COREG) 3.125 MG tablet, Take 1 tablet (3.125 mg total) by mouth 2 (two) times daily with a meal., Disp: 60 tablet, Rfl: 2 .  chlordiazePOXIDE (LIBRIUM) 25 MG capsule, 36m PO TID x 1D, then 25-519mPO BID X 1D, then 25-5042mO QD X 1D, Disp: 10 capsule, Rfl: 0 .  diclofenac sodium (VOLTAREN) 1 % GEL, Apply 2 g topically 4 (four) times daily. (Patient taking differently: Apply 2 g topically 4  (four) times daily as needed. ), Disp: 100 g, Rfl: 3 .  furosemide (LASIX) 20 MG tablet, Take 1 tablet (20 mg total) by mouth daily., Disp: 30 tablet, Rfl: 3 .  glucose blood (ACCU-CHEK GUIDE) test strip, Use as directed to test blood sugar once daily. DX: E11.9, Disp: 100 each, Rfl: 12 .  losartan (COZAAR) 100 MG tablet, Take 1 tablet (100 mg total) by mouth daily., Disp: 90 tablet, Rfl: 1 .  metFORMIN (GLUCOPHAGE) 1000 MG tablet, Take 1 tablet (1,000 mg total) by mouth 2 (two) times daily with a meal., Disp: 180 tablet, Rfl: 1 .  potassium chloride (KLOR-CON) 8 MEQ tablet, Take 2 tablets (16 mEq total) by mouth 2 (two) times daily., Disp: 120 tablet, Rfl: 6 .  tadalafil (CIALIS) 10 MG tablet, 1 tab PO Q 1 hr prior to sex, Disp: 20 tablet, Rfl: 3 .  traMADol (ULTRAM) 50 MG tablet, Take 1 tablet (50 mg total) by mouth 2 (two) times daily as needed., Disp: 60 tablet, Rfl: 0 .  traMADol (ULTRAM) 50 MG tablet, Take 1 tablet (50 mg total) by mouth 2 (two) times daily as needed. To fill on or after 05/06/2018, Disp: 60 tablet, Rfl: 0  Allergies Latex  Review of Systems Review of Systems - Oncology ROS negative   Physical Exam  Vitals Wt  Readings from Last 3 Encounters:  04/12/18 (!) 349 lb (158.3 kg)  03/27/18 (!) 346 lb 12.5 oz (157.3 kg)  03/22/18 (!) 346 lb 12.8 oz (157.3 kg)   Temp Readings from Last 3 Encounters:  04/12/18 98.3 F (36.8 C) (Oral)  03/27/18 98.1 F (36.7 C) (Oral)  03/22/18 98 F (36.7 C) (Oral)   BP Readings from Last 3 Encounters:  04/12/18 (!) 170/90  03/27/18 (!) 162/91  03/22/18 (!) 163/81   Pulse Readings from Last 3 Encounters:  04/12/18 83  03/27/18 87  03/22/18 76   Constitutional: Well-developed, well-nourished, and in no distress.   HENT: Head: Normocephalic and atraumatic.  Mouth/Throat: No oropharyngeal exudate. Mucosa moist. Eyes: Pupils are equal, round, and reactive to light. Conjunctivae are normal. No scleral icterus.  Neck: Normal range  of motion. Neck supple. No JVD present.  Cardiovascular: Normal rate, regular rhythm and normal heart sounds.  Exam reveals no gallop and no friction rub.   No murmur heard. Pulmonary/Chest: Effort normal and breath sounds normal. No respiratory distress. No wheezes.No rales.  Abdominal: Soft. Obese,  Bowel sounds are normal. No distension. There is no tenderness. There is no guarding.  Musculoskeletal: No edema or tenderness.  Lymphadenopathy: No cervical, axillary or supraclavicular adenopathy.  Neurological: Alert and oriented to person, place, and time. No cranial nerve deficit.  Skin: Skin is warm and dry. No rash noted. No erythema. No pallor.  Psychiatric: Affect and judgment normal.   Labs No visits with results within 3 Day(s) from this visit.  Latest known visit with results is:  Admission on 03/27/2018, Discharged on 03/27/2018  Component Date Value Ref Range Status  . WBC 03/27/2018 8.5  4.0 - 10.5 K/uL Final  . RBC 03/27/2018 5.06  4.22 - 5.81 MIL/uL Final  . Hemoglobin 03/27/2018 13.1  13.0 - 17.0 g/dL Final  . HCT 03/27/2018 40.9  39.0 - 52.0 % Final  . MCV 03/27/2018 80.8  80.0 - 100.0 fL Final  . MCH 03/27/2018 25.9* 26.0 - 34.0 pg Final  . MCHC 03/27/2018 32.0  30.0 - 36.0 g/dL Final  . RDW 03/27/2018 14.8  11.5 - 15.5 % Final  . Platelets 03/27/2018 195  150 - 400 K/uL Final  . nRBC 03/27/2018 0.0  0.0 - 0.2 % Final  . Neutrophils Relative % 03/27/2018 26  % Final  . Neutro Abs 03/27/2018 2.2  1.7 - 7.7 K/uL Final  . Lymphocytes Relative 03/27/2018 61  % Final  . Lymphs Abs 03/27/2018 5.2* 0.7 - 4.0 K/uL Final  . Monocytes Relative 03/27/2018 10  % Final  . Monocytes Absolute 03/27/2018 0.8  0.1 - 1.0 K/uL Final  . Eosinophils Relative 03/27/2018 2  % Final  . Eosinophils Absolute 03/27/2018 0.2  0.0 - 0.5 K/uL Final  . Basophils Relative 03/27/2018 1  % Final  . Basophils Absolute 03/27/2018 0.1  0.0 - 0.1 K/uL Final  . Immature Granulocytes 03/27/2018 0  %  Final  . Abs Immature Granulocytes 03/27/2018 0.02  0.00 - 0.07 K/uL Final   Performed at [facility], [place] [address], [place], [place] [address]  . Sodium 03/27/2018 136  135 - 145 mmol/L Final  . Potassium 03/27/2018 3.4* 3.5 - 5.1 mmol/L Final  . Chloride 03/27/2018 101  98 - 111 mmol/L Final  . CO2 03/27/2018 23  22 - 32 mmol/L Final  . Glucose, Bld 03/27/2018 160* 70 - 99 mg/dL Final  . BUN 03/27/2018 12  8 - 23 mg/dL  Final  . Creatinine, Ser 03/27/2018 1.16  0.61 - 1.24 mg/dL Final  . Calcium 03/27/2018 10.6* 8.9 - 10.3 mg/dL Final  . Total Protein 03/27/2018 7.6  6.5 - 8.1 g/dL Final  . Albumin 03/27/2018 3.9  3.5 - 5.0 g/dL Final  . AST 03/27/2018 27  15 - 41 U/L Final  . ALT 03/27/2018 26  0 - 44 U/L Final  . Alkaline Phosphatase 03/27/2018 41  38 - 126 U/L Final  . Total Bilirubin 03/27/2018 0.7  0.3 - 1.2 mg/dL Final  . GFR calc non Af Amer 03/27/2018 >60  >60 mL/min Final  . GFR calc Af Amer 03/27/2018 >60  >60 mL/min Final  . Anion gap 03/27/2018 12  5 - 15 Final   Performed at [facility] [address], [place], [place] [address]  . Alcohol, Ethyl (B) 03/27/2018 <10  <10 mg/dL Final   Comment: (NOTE) Lowest detectable limit for serum alcohol is 10 mg/dL. For medical purposes only. Performed at [facility], [place] [address], [place], [place] [address]      Pathology Orders Placed This Encounter  Procedures  . CBC with Differential/Platelet    Standing Status:   Future    Standing Expiration Date:   04/13/2019  . Comprehensive metabolic panel    Standing Status:   Future    Standing Expiration Date:   04/13/2019  . Lactate dehydrogenase    Standing Status:   Future    Standing Expiration Date:   04/13/2019  . Protein electrophoresis, serum    Standing Status:   Future    Standing Expiration Date:   04/13/2019  . Ferritin    Standing Status:   Future    Standing Expiration Date:   04/13/2019  . Calcium, ionized    Standing Status:   Future     Standing Expiration Date:   04/13/2019       Zoila Shutter MD

## 2018-04-18 ENCOUNTER — Other Ambulatory Visit: Payer: Self-pay

## 2018-04-18 NOTE — Patient Outreach (Signed)
[employer] Diabetic - Eye Exam  Patient responded to the call but once we were on the phone he put me on hold then can back and we continued the call and while I was talking he hung up.

## 2018-04-26 ENCOUNTER — Ambulatory Visit: Payer: Medicare HMO | Attending: Internal Medicine | Admitting: Internal Medicine

## 2018-04-26 ENCOUNTER — Encounter: Payer: Self-pay | Admitting: Internal Medicine

## 2018-04-26 VITALS — BP 186/94 | HR 72 | Temp 97.9°F | Resp 16 | Ht 73.0 in | Wt 345.4 lb

## 2018-04-26 DIAGNOSIS — E119 Type 2 diabetes mellitus without complications: Secondary | ICD-10-CM

## 2018-04-26 DIAGNOSIS — Z114 Encounter for screening for human immunodeficiency virus [HIV]: Secondary | ICD-10-CM

## 2018-04-26 DIAGNOSIS — Z1211 Encounter for screening for malignant neoplasm of colon: Secondary | ICD-10-CM

## 2018-04-26 DIAGNOSIS — N529 Male erectile dysfunction, unspecified: Secondary | ICD-10-CM | POA: Diagnosis not present

## 2018-04-26 DIAGNOSIS — Z23 Encounter for immunization: Secondary | ICD-10-CM

## 2018-04-26 DIAGNOSIS — Z6841 Body Mass Index (BMI) 40.0 and over, adult: Secondary | ICD-10-CM | POA: Diagnosis not present

## 2018-04-26 DIAGNOSIS — Z87898 Personal history of other specified conditions: Secondary | ICD-10-CM | POA: Insufficient documentation

## 2018-04-26 DIAGNOSIS — M1711 Unilateral primary osteoarthritis, right knee: Secondary | ICD-10-CM

## 2018-04-26 DIAGNOSIS — I10 Essential (primary) hypertension: Secondary | ICD-10-CM

## 2018-04-26 LAB — GLUCOSE, POCT (MANUAL RESULT ENTRY): POC Glucose: 124 mg/dl — AB (ref 70–99)

## 2018-04-26 LAB — POCT GLYCOSYLATED HEMOGLOBIN (HGB A1C): HbA1c, POC (controlled diabetic range): 6.7 % (ref 0.0–7.0)

## 2018-04-26 MED ORDER — PNEUMOCOCCAL 13-VAL CONJ VACC IM SUSP: 0.5000 mL | INTRAMUSCULAR | 0 refills | Status: AC

## 2018-04-26 MED ORDER — TADALAFIL 20 MG PO TABS: ORAL_TABLET | ORAL | 1 refills | Status: DC

## 2018-04-26 MED FILL — TADALAFIL 20 MG TABS: 20 | 1 days supply | Qty: 1 | Fill #0

## 2018-04-26 MED FILL — PREVNAR 13 SYRINGE: 1 days supply | Qty: 1 | Fill #0

## 2018-04-26 NOTE — Progress Notes (Signed)
Patient ID: Austin Mercer, male    DOB: 04-07-1950  MRN: [id number]  CC: No chief complaint on file.   Subjective: Austin Mercer is a 68 y.o. male who presents for chronic ds management. His concerns today include:  Pt gives hx of severe RT OA of knee, lower back pain x 1 yr (had x-ray that showed arthritis), HTN, DM, hep C (treated at [place], cured), ED.  HTN/hyperCa+: Did not bring meds with him today and does not know names.  On last visit, chlorthalidone was discontinued and patient was started on Coreg and furosemide instead. Reports compliance with medications and salt restrictions. Did take meds already for today. His girlfriend checks his blood pressure but he does not recall the numbers. Denies any chest pains or shortness of breath.  DM: Not getting in much exercise.  Has a stationary bike at home; not using it.  He plans to start doing so now that his knee is feeling a bit better. Loves potatoes and fried foods Reports compliance with metformin.  ED: Cialis increased 10 mg on last visit. Reports that it did not work.  Wants to know if there is a higher dose.  Seen in ER 03/27/2018 with tremors after he drank gin and wine.  Thinks he drank too much gin on that occasion.  Admits that he he was a heavy drinker in past.  Now drinks one 12 oz beer QOD.      HM: Due for Prevnar 13 and colon cancer screening.  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 Positive for macroalbuminuria 01/27/2018  . Hypercalcemia 01/24/2018  . Controlled type 2 diabetes mellitus without complication, without long-term current use of insulin ([place]) 11/23/2017  . Essential hypertension 11/23/2017  . Primary osteoarthritis of right knee 11/23/2017  . Class 3 severe obesity due to excess calories with serious comorbidity and body mass index (BMI) of 45.0 to 49.9 in adult ([place]) 11/23/2017  . Age-related  cataract of right eye 11/23/2017  . Hepatitis C virus infection cured after antiviral drug therapy 11/23/2017  . Chronic, continuous use of opioids 11/23/2017     Current Outpatient Medications on File Prior to Visit  Medication Sig Dispense Refill  . ACCU-CHEK FASTCLIX LANCETS MISC Use as directed to test blood sugar once daily. DX: E11.9 100 each 12  . amLODipine (NORVASC) 10 MG tablet Take 1 tablet (10 mg total) by mouth daily. 90 tablet 1  . atorvastatin (LIPITOR) 10 MG tablet Take 1 tablet (10 mg total) by mouth daily. 90 tablet 3  . Blood Glucose Monitoring Suppl (ACCU-CHEK GUIDE) w/Device KIT 1 each by Does not apply route daily. Use as directed to test blood sugar once daily. DX: E11.9 1 kit 0  . carvedilol (COREG) 3.125 MG tablet Take 1 tablet (3.125 mg total) by mouth 2 (two) times daily with a meal. 60 tablet 2  . chlordiazePOXIDE (LIBRIUM) 25 MG capsule 23m PO TID x 1D, then 25-523mPO BID X 1D, then 25-5072mO QD X 1D 10 capsule 0  . diclofenac sodium (VOLTAREN) 1 % GEL Apply 2 g topically 4 (four) times daily. (Patient taking differently: Apply 2 g topically 4 (four) times daily as needed. ) 100 g 3  . furosemide (LASIX) 20 MG tablet Take 1 tablet (20 mg total) by mouth daily. 30 tablet 3  . glucose blood (ACCU-CHEK GUIDE)  test strip Use as directed to test blood sugar once daily. DX: E11.9 100 each 12  . losartan (COZAAR) 100 MG tablet Take 1 tablet (100 mg total) by mouth daily. 90 tablet 1  . metFORMIN (GLUCOPHAGE) 1000 MG tablet Take 1 tablet (1,000 mg total) by mouth 2 (two) times daily with a meal. 180 tablet 1  . potassium chloride (KLOR-CON) 8 MEQ tablet Take 2 tablets (16 mEq total) by mouth 2 (two) times daily. 120 tablet 6  . traMADol (ULTRAM) 50 MG tablet Take 1 tablet (50 mg total) by mouth 2 (two) times daily as needed. 60 tablet 0  . traMADol (ULTRAM) 50 MG tablet Take 1 tablet (50 mg total) by mouth 2 (two) times daily as needed. To fill on or after 05/06/2018 60  tablet 0   No current facility-administered medications on file prior to visit.     Allergies  Allergen Reactions  . Latex Itching    Social History   Socioeconomic History  . Marital status: Divorced    Spouse name: Not on file  . Number of children: 2  . Years of education: Not on file  . Highest education level: Not on file  Occupational History  . Occupation: retired  Scientific laboratory technician  . Financial resource strain: Not on file  . Food insecurity:    Worry: Not on file    Inability: Not on file  . Transportation needs:    Medical: Not on file    Non-medical: Not on file  Tobacco Use  . Smoking status: Former Smoker    Last attempt to quit: 1973    Years since quitting: 47.0  . Smokeless tobacco: Never Used  Substance and Sexual Activity  . Alcohol use: Yes    Comment: Beer, Red wine  . Drug use: Not Currently  . Sexual activity: Not on file  Lifestyle  . Physical activity:    Days per week: Not on file    Minutes per session: Not on file  . Stress: Not on file  Relationships  . Social connections:    Talks on phone: Not on file    Gets together: Not on file    Attends religious service: Not on file    Active member of club or organization: Not on file    Attends meetings of clubs or organizations: Not on file    Relationship status: Not on file  . Intimate partner violence:    Fear of current or ex partner: Not on file    Emotionally abused: Not on file    Physically abused: Not on file    Forced sexual activity: Not on file  Other Topics Concern  . Not on file  Social History Narrative  . Not on file    Family History  Problem Relation Age of Onset  . Diabetes Father     Past Surgical History:  Procedure Laterality Date  . ANKLE FRACTURE SURGERY      ROS: Review of Systems  PHYSICAL EXAM: BP (!) 186/94   Pulse 72   Temp 97.9 F (36.6 C) (Oral)   Resp 16   Ht _0  (1.854 m)   Wt (!) 345 lb 6.4 oz (156.7 kg)   SpO2 98%   BMI 45.57  kg/m   Wt Readings from Last 3 Encounters:  04/26/18 (!) 345 lb 6.4 oz (156.7 kg)  04/12/18 (!) 349 lb (158.3 kg)  03/27/18 (!) 346 lb 12.5 oz (157.3 kg)   Repeat BP 170/90  Physical Exam General appearance - alert, well appearing, and in no distress Mental status -patient very talkative.  He swears intermittently during the conversation Neck - supple, no significant adenopathy Chest - clear to auscultation, no wheezes, rales or rhonchi, symmetric air entry Heart - normal rate, regular rhythm, normal S1, S2, no murmurs, rubs, clicks or gallops Musculoskeletal -he has some difficulty in transferring from the chair to the exam table Extremities -no lower extremity edema  Results for orders placed or performed in visit on 04/26/18  POCT glucose (manual entry)  Result Value Ref Range   POC Glucose 124 (A) 70 - 99 mg/dl  POCT glycosylated hemoglobin (Hb A1C)  Result Value Ref Range   Hemoglobin A1C     HbA1c POC (<> result, manual entry)     HbA1c, POC (prediabetic range)     HbA1c, POC (controlled diabetic range) 6.7 0.0 - 7.0 %    ASSESSMENT AND PLAN: 1. Controlled type 2 diabetes mellitus without complication, without long-term current use of insulin ([place]) Diabetes well controlled.  Continue metformin. Dietary counseling given.  Advised to cut back on all white carbohydrates and fried foods.  Advised to avoid sugary drinks like sodas, sweet tea and juices. Encouraged him to use his stationary bike at least 3 times a week if only for 10 minutes for starters - POCT glucose (manual entry) - POCT glycosylated hemoglobin (Hb A1C)  2. Class 3 severe obesity due to excess calories with serious comorbidity and body mass index (BMI) of 45.0 to 49.9 in adult [facility]) See #1 above  3. Essential hypertension Not at goal.  Patient with low health literacy.  I have asked him to bring his medicines with him on every visit.  We will have him return early next week to meet with our clinical  pharmacist to make sure that he is taking all of his blood pressure medications that include Cozaar, amlodipine, carvedilol and furosemide.  Patient advised to take his medicines before coming to that visit.  If blood pressure still uncontrolled on that visit, I recommend increasing the carvedilol.  4. Erectile dysfunction of organic origin We will increase Cialis to 20 mg as needed to see if this works better for him  5. Hypercalcemia Chlorthalidone was stopped on last visit.  We will recheck BMP today  6. Primary osteoarthritis of right knee Strongly encouraged weight loss.  He is on tramadol and tolerating the medication.  7. History of alcohol use disorder Encouraged him to stay away from alcoholic beverages completely.  However he states that he will drink a beer a few times a week and I went over with him how much is too much alcohol in one setting for a male..  8. Need for vaccination against Streptococcus pneumoniae using pneumococcal conjugate vaccine 13 Given  9. Colon cancer screening Given fit test    Patient was given the opportunity to ask questions.  Patient verbalized understanding of the plan and was able to repeat key elements of the plan.   Orders Placed This Encounter  Procedures  . Fecal occult blood, imunochemical([facility])  . HIV Antibody (routine testing w rflx)  . Basic Metabolic Panel  . POCT glucose (manual entry)  . POCT glycosylated hemoglobin (Hb A1C)     Requested Prescriptions   Signed Prescriptions Disp Refills  . pneumococcal 13-valent conjugate vaccine (PREVNAR 13) SUSP injection 0.5 mL 0    Sig: Inject 0.5 mLs into the muscle tomorrow at 10 am for 1 dose.  . tadalafil (  ADCIRCA/CIALIS) 20 MG tablet 20 tablet 1    Sig: 1 tab PO Q 1 hr prior to sex as needed.  Limit use to 1 tab/24 hr    No follow-ups on file.  Karle Plumber, MD, FACP

## 2018-04-26 NOTE — Patient Instructions (Addendum)
Please give patient an appointment with Austin Mercer for early next week for blood pressure recheck.  Please bring all of your medications with you to your visit next week with the clinical pharmacist.  We have increased the Cialis to 20 mg daily.  I encourage you to use your stationary bike at home at least 3 times a week for exercise.  Follow a Healthy Eating Plan - You can do it! Limit sugary drinks.  Avoid sodas, sweet tea, sport or energy drinks, or fruit drinks.  Drink water, lo-fat milk, or diet drinks. Limit snack foods.   Cut back on candy, cake, cookies, chips, ice cream.  These are a special treat, only in small amounts. Eat plenty of vegetables.  Especially dark green, red, and orange vegetables. Aim for at least 3 servings a day. More is better! Include fruit in your daily diet.  Whole fruit is much healthier than fruit juice! Limit "white" bread, "white" pasta, "white" rice.   Choose "100% whole grain" products, brown or wild rice. Avoid fatty meats. Try "Meatless Monday" and choose eggs or beans one day a week.  When eating meat, choose lean meats like chicken, [place], and fish.  Grill, broil, or bake meats instead of frying, and eat poultry without the skin. Eat less salt.  Avoid frozen pizzas, frozen dinners and salty foods.  Use seasonings other than salt in cooking.  This can help blood pressure and keep you from swelling Beer, wine and liquor have calories.  If you can safely drink alcohol, limit to 1 drink per day for women, 2 drinks for men  Pneumococcal Conjugate Vaccine (PCV13): What You Need to Know 1. Why get vaccinated? Vaccination can protect both children and adults from pneumococcal disease. Pneumococcal disease is caused by bacteria that can spread from person to person through close contact. It can cause ear infections, and it can also lead to more serious infections of the:  Lungs (pneumonia),  Blood (bacteremia), and  Covering of the brain and spinal cord  (meningitis). Pneumococcal pneumonia is most common among adults. Pneumococcal meningitis can cause deafness and brain damage, and it kills about 1 child in 10 who get it. Anyone can get pneumococcal disease, but children under 39 years of age and adults 70 years and older, people with certain medical conditions, and cigarette smokers are at the highest risk. Before there was a vaccine, the [place] saw:  more than 700 cases of meningitis,  about 13,000 blood infections,  about 5 million ear infections, and  about 200 deaths in children under 5 each year from pneumococcal disease. Since vaccine became available, severe pneumococcal disease in these children has fallen by 88%. About 18,000 older adults die of pneumococcal disease each year in the [place]. Treatment of pneumococcal infections with penicillin and other drugs is not as effective as it used to be, because some strains of the disease have become resistant to these drugs. This makes prevention of the disease, through vaccination, even more important. 2. PCV13 vaccine Pneumococcal conjugate vaccine (called PCV13) protects against 13 types of pneumococcal bacteria. PCV13 is routinely given to children at 2, 4, 6, and 7-70 months of age. It is also recommended for children and adults 51 to 18 years of age with certain health conditions, and for all adults 14 years of age and older. Your doctor can give you details. 3. Some people should not get this vaccine Anyone who has ever had a life-threatening allergic reaction to a dose of this  vaccine, to an earlier pneumococcal vaccine called PCV7, or to any vaccine containing diphtheria toxoid (for example, DTaP), should not get PCV13. Anyone with a severe allergy to any component of PCV13 should not get the vaccine. Tell your doctor if the person being vaccinated has any severe allergies. If the person scheduled for vaccination is not feeling well, your healthcare provider might  decide to reschedule the shot on another day. 4. Risks of a vaccine reaction With any medicine, including vaccines, there is a chance of reactions. These are usually mild and go away on their own, but serious reactions are also possible. Problems reported following PCV13 varied by age and dose in the series. The most common problems reported among children were:  About half became drowsy after the shot, had a temporary loss of appetite, or had redness or tenderness where the shot was given.  About 1 out of 3 had swelling where the shot was given.  About 1 out of 3 had a mild fever, and about 1 in 20 had a fever over 102.56F.  Up to about 8 out of 10 became fussy or irritable. Adults have reported pain, redness, and swelling where the shot was given; also mild fever, fatigue, headache, chills, or muscle pain. Young children who get PCV13 along with inactivated flu vaccine at the same time may be at increased risk for seizures caused by fever. Ask your doctor for more information. Problems that could happen after any vaccine:  People sometimes faint after a medical procedure, including vaccination. Sitting or lying down for about 15 minutes can help prevent fainting, and injuries caused by a fall. Tell your doctor if you feel dizzy, or have vision changes or ringing in the ears.  Some older children and adults get severe pain in the shoulder and have difficulty moving the arm where a shot was given. This happens very rarely.  Any medication can cause a severe allergic reaction. Such reactions from a vaccine are very rare, estimated at about 1 in a million doses, and would happen within a few minutes to a few hours after the vaccination. As with any medicine, there is a very small chance of a vaccine causing a serious injury or death. The safety of vaccines is always being monitored. For more information, visit: http://floyd.org/www.cdc.gov/vaccinesafety/ 5. What if there is a serious reaction? What should I  look for?  Look for anything that concerns you, such as signs of a severe allergic reaction, very high fever, or unusual behavior. Signs of a severe allergic reaction can include hives, swelling of the face and throat, difficulty breathing, a fast heartbeat, dizziness, and weakness-usually within a few minutes to a few hours after the vaccination. What should I do?  If you think it is a severe allergic reaction or other emergency that can't wait, call 9-1-1 or get the person to the nearest hospital. Otherwise, call your doctor. Reactions should be reported to the Vaccine Adverse Event Reporting System (VAERS). Your doctor should file this report, or you can do it yourself through the VAERS web site at www.vaers.LAgents.nohhs.gov, or by calling 1-509-512-3625. VAERS does not give medical advice. 6. The National Vaccine Injury Compensation Program The [employer] Vaccine Injury Compensation Program (VICP) is a federal program that was created to compensate people who may have been injured by certain vaccines. Persons who believe they may have been injured by a vaccine can learn about the program and about filing a claim by calling 1-(951) 720-5395 or visiting the VICP website at  SpiritualWord.at. There is a time limit to file a claim for compensation. 7. How can I learn more?  Ask your healthcare provider. He or she can give you the vaccine package insert or suggest other sources of information.  Call your local or state health department.  Contact the Centers for Disease Control and Prevention (CDC): ? Call (250)845-6994 (1-800-CDC-INFO) or ? Visit CDC's website at PicCapture.uy Vaccine Information Statement PCV13 Vaccine (02/05/2014) This information is not intended to replace advice given to you by your health care provider. Make sure you discuss any questions you have with your health care provider. Document Released: 01/15/2006 Document Revised: 10/30/2017 Document Reviewed:  10/30/2017 Elsevier Interactive Patient Education  2019 [employer].

## 2018-04-27 LAB — BASIC METABOLIC PANEL
BUN/Creatinine Ratio: 11 (ref 10–24)
BUN: 12 mg/dL (ref 8–27)
CO2: 21 mmol/L (ref 20–29)
Calcium: 10.9 mg/dL — ABNORMAL HIGH (ref 8.6–10.2)
Chloride: 102 mmol/L (ref 96–106)
Creatinine, Ser: 1.1 mg/dL (ref 0.76–1.27)
GFR calc Af Amer: 81 mL/min/{1.73_m2} (ref >59)
GFR calc non Af Amer: 70 mL/min/{1.73_m2} (ref >59)
Glucose: 122 mg/dL — ABNORMAL HIGH (ref 65–99)
Potassium: 3.7 mmol/L (ref 3.5–5.2)
Sodium: 139 mmol/L (ref 134–144)

## 2018-04-27 LAB — HIV ANTIBODY (ROUTINE TESTING W REFLEX): HIV Screen 4th Generation wRfx: NONREACTIVE

## 2018-04-29 ENCOUNTER — Encounter: Payer: Medicare HMO | Admitting: Pharmacist

## 2018-05-02 ENCOUNTER — Telehealth: Payer: Self-pay | Admitting: Internal Medicine

## 2018-05-02 NOTE — Telephone Encounter (Signed)
1) Medication(s) Requested (by name): Tramadol  ° °2) Pharmacy of Choice: [facility] pharmacy  ° °3) Special Requests: ° ° °Approved medications will be sent to the pharmacy, we will reach out if there is an issue. ° °Requests made after 3pm may not be addressed until the following business day! ° °If a patient is unsure of the name of the medication(s) please note and ask patient to call back when they are able to provide all info, do not send to responsible party until all information is available! ° °

## 2018-05-06 ENCOUNTER — Telehealth: Payer: Self-pay

## 2018-05-06 MED FILL — traMADol HCL 50 MG TABS: 50 | 30 days supply | Qty: 60 | Fill #0

## 2018-05-06 NOTE — Telephone Encounter (Signed)
Margaret could you contact pt  

## 2018-05-06 NOTE — Telephone Encounter (Signed)
Contacted pt to go over lab results pt didn't answer was unable to lvm due to call can't be completed at this time

## 2018-05-06 NOTE — Telephone Encounter (Signed)
Rx was already on hold with the pharmacy, I placed the refill and patient will be notified when it is ready for pickup.

## 2018-05-14 NOTE — Telephone Encounter (Signed)
error 

## 2018-05-28 MED FILL — FUROSEMIDE 20 MG TABLET: 20 | 30 days supply | Qty: 30 | Fill #1

## 2018-05-29 MED FILL — CARVEDILOL 3.125 MG TABLET: 3.125 | 30 days supply | Qty: 60 | Fill #1

## 2018-06-05 ENCOUNTER — Other Ambulatory Visit: Payer: Self-pay | Admitting: Internal Medicine

## 2018-06-05 NOTE — Telephone Encounter (Signed)
Pt last seen: 04/26/18 Next appt: 06/06/18 Last RX written on: 04/05/18 Date of original fill: 05/06/18 Date of refill(s): n/a  No other controlled substances were filled during this time, please refill if appropriate.

## 2018-06-06 ENCOUNTER — Ambulatory Visit: Payer: Medicare HMO | Admitting: Internal Medicine

## 2018-06-06 ENCOUNTER — Ambulatory Visit: Payer: Medicare HMO | Attending: Internal Medicine | Admitting: Internal Medicine

## 2018-06-06 ENCOUNTER — Encounter: Payer: Self-pay | Admitting: Internal Medicine

## 2018-06-06 VITALS — BP 170/100 | HR 76 | Temp 98.5°F | Resp 16 | Wt 339.8 lb

## 2018-06-06 DIAGNOSIS — E119 Type 2 diabetes mellitus without complications: Secondary | ICD-10-CM | POA: Diagnosis not present

## 2018-06-06 DIAGNOSIS — M1711 Unilateral primary osteoarthritis, right knee: Secondary | ICD-10-CM

## 2018-06-06 DIAGNOSIS — I10 Essential (primary) hypertension: Secondary | ICD-10-CM | POA: Diagnosis not present

## 2018-06-06 DIAGNOSIS — J069 Acute upper respiratory infection, unspecified: Secondary | ICD-10-CM

## 2018-06-06 LAB — GLUCOSE, POCT (MANUAL RESULT ENTRY): POC Glucose: 141 mg/dl — AB (ref 70–99)

## 2018-06-06 MED ORDER — TRAMADOL HCL 50 MG PO TABS: 50.0000 mg | ORAL_TABLET | Freq: Two times a day (BID) | ORAL | 0 refills | Status: DC | PRN

## 2018-06-06 MED ORDER — AMLODIPINE BESYLATE 10 MG PO TABS: 10.0000 mg | ORAL_TABLET | Freq: Every day | ORAL | 1 refills | Status: DC

## 2018-06-06 MED ORDER — CARVEDILOL 3.125 MG PO TABS: ORAL_TABLET | ORAL | 2 refills | Status: DC

## 2018-06-06 MED ORDER — LOSARTAN POTASSIUM 100 MG PO TABS: 100.0000 mg | ORAL_TABLET | Freq: Every day | ORAL | 1 refills | Status: DC

## 2018-06-06 MED FILL — traMADol HCL 50 MG TABS: 50 | 30 days supply | Qty: 60 | Fill #0

## 2018-06-06 NOTE — Patient Instructions (Signed)
Please give patient an appointment with the clinical pharmacist in 2 weeks for repeat blood pressure check.  Please remember to bring all of your medicines with you on office visits.  Increase the carvedilol to 2 tablets twice a day.

## 2018-06-06 NOTE — Progress Notes (Signed)
Patient ID: Austin Mercer, male    DOB: 1950/08/08  MRN: [id number]  CC:  F/u BP  Subjective:  Austin Mercer is a 68 y.o. male who presents for UC visit. His concerns today include:  Pt gives hx of severe RT OA of knee, lower back pain (had x-ray that showed arthritis), HTN, DM, hep C (treated at [place], cured), ED.  OA knee: still getting shots with [employer].   Shots are once a wk for 5 wks.  Has one more shot to go.  Does help.  Pain worse in cold and rainy weather.  Takes Tramadol which also help.  Tolerating Tramadol okay.  He is functional at home.  HTN; took meds already for the morning.  Did not bring meds with him and states that he limits salt but "I do eat bacon." Not checking BP  No CP/SOB  Getting over a cold that started 2 days ago.  +Cough productive of white phlegm.  No SOB or fever.  Taking Robitussin DM  Patient Active Problem List   Diagnosis Date Noted  . History of alcohol use disorder 04/26/2018  . 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 Positive for macroalbuminuria 01/27/2018  . Hypercalcemia 01/24/2018  . Controlled type 2 diabetes mellitus without complication, without long-term current use of insulin ([place]) 11/23/2017  . Essential hypertension 11/23/2017  . Primary osteoarthritis of right knee 11/23/2017  . Class 3 severe obesity due to excess calories with serious comorbidity and body mass index (BMI) of 45.0 to 49.9 in adult ([place]) 11/23/2017  . Age-related cataract of right eye 11/23/2017  . Hepatitis C virus infection cured after antiviral drug therapy 11/23/2017  . Chronic, continuous use of opioids 11/23/2017     Current Outpatient Medications on File Prior to Visit  Medication Sig Dispense Refill  . ACCU-CHEK FASTCLIX LANCETS MISC Use as directed to test blood sugar once daily. DX: E11.9 100 each 12  . amLODipine (NORVASC) 10 MG tablet Take 1 tablet (10 mg total) by mouth daily. 90 tablet 1  . atorvastatin (LIPITOR) 10 MG tablet Take 1  tablet (10 mg total) by mouth daily. 90 tablet 3  . Blood Glucose Monitoring Suppl (ACCU-CHEK GUIDE) w/Device KIT 1 each by Does not apply route daily. Use as directed to test blood sugar once daily. DX: E11.9 1 kit 0  . carvedilol (COREG) 3.125 MG tablet Take 1 tablet (3.125 mg total) by mouth 2 (two) times daily with a meal. 60 tablet 2  . chlordiazePOXIDE (LIBRIUM) 25 MG capsule 33m PO TID x 1D, then 25-581mPO BID X 1D, then 25-5060mO QD X 1D 10 capsule 0  . diclofenac sodium (VOLTAREN) 1 % GEL Apply 2 g topically 4 (four) times daily. (Patient taking differently: Apply 2 g topically 4 (four) times daily as needed. ) 100 g 3  . furosemide (LASIX) 20 MG tablet Take 1 tablet (20 mg total) by mouth daily. 30 tablet 3  . glucose blood (ACCU-CHEK GUIDE) test strip Use as directed to test blood sugar once daily. DX: E11.9 100 each 12  . losartan (COZAAR) 100 MG tablet Take 1 tablet (100 mg total) by mouth daily. 90 tablet 1  . metFORMIN (GLUCOPHAGE) 1000 MG tablet Take 1 tablet (1,000 mg total) by mouth 2 (two) times daily with a meal. 180 tablet 1  . potassium chloride (KLOR-CON) 8 MEQ tablet Take 2 tablets (16 mEq total) by mouth 2 (two) times daily. 120 tablet 6  . tadalafil (ADCIRCA/CIALIS) 20  MG tablet 1 tab PO Q 1 hr prior to sex as needed.  Limit use to 1 tab/24 hr 20 tablet 1  . traMADol (ULTRAM) 50 MG tablet Take 1 tablet (50 mg total) by mouth 2 (two) times daily as needed. 60 tablet 0  . traMADol (ULTRAM) 50 MG tablet Take 1 tablet (50 mg total) by mouth 2 (two) times daily as needed. To fill on or after 05/06/2018 60 tablet 0   No current facility-administered medications on file prior to visit.     Allergies  Allergen Reactions  . Latex Itching     ROS: Review of Systems Negative except as above  PHYSICAL EXAM: BP (!) 188/94   Pulse 76   Temp 98.5 F (36.9 C) (Oral)   Resp 16   Wt (!) 339 lb 12.8 oz (154.1 kg)   SpO2 97%   BMI 44.83 kg/m   BP 170/100 Physical  Exam General appearance - alert, well appearing, and in no distress Mental status -patient answers questions appropriately.  He swears intermittently. Nose - normal and patent, no erythema, discharge or polyps Mouth - mucous membranes moist, pharynx normal without lesions Neck - supple, no significant adenopathy Chest - clear to auscultation, no wheezes, rales or rhonchi, symmetric air entry Heart - normal rate, regular rhythm, normal S1, S2, no murmurs, rubs, clicks or gallops Extremities -trace lower extremity edema   Results for orders placed or performed in visit on 06/06/18  POCT glucose (manual entry)  Result Value Ref Range   POC Glucose 141 (A) 70 - 99 mg/dl    ASSESSMENT AND PLAN:  1. Essential hypertension Not at goal. Increase carvedilol to 6.25 mg twice a day. Encouraged him to limit salt intake. Follow-up with clinical pharmacist in 2 weeks for repeat blood pressure check. Advised patient to bring all of his medicines with him on every visit - amLODipine (NORVASC) 10 MG tablet; Take 1 tablet (10 mg total) by mouth daily.  Dispense: 90 tablet; Refill: 1 - losartan (COZAAR) 100 MG tablet; Take 1 tablet (100 mg total) by mouth daily.  Dispense: 90 tablet; Refill: 1 - carvedilol (COREG) 3.125 MG tablet; Take 2 tablets p.o. twice a day  Dispense: 120 tablet; Refill: 2  2. Viral upper respiratory tract infection Recommend conservative measures.  For future reference, I told him to use diabetic Tussin if he uses an over-the-counter cough suppressant  3. Primary osteoarthritis of right knee Patient reports good relief of knee pain with tramadol.  He is functional.  [place] controlled substance reporting system reviewed and is appropriate.  Refill given on tramadol. - traMADol (ULTRAM) 50 MG tablet; Take 1 tablet (50 mg total) by mouth 2 (two) times daily as needed.  Dispense: 60 tablet; Refill: 0 - traMADol (ULTRAM) 50 MG tablet; Take 1 tablet (50 mg total) by mouth 2  (two) times daily as needed. To fill on or after 07/06/2018  Dispense: 60 tablet; Refill: 0  4. Controlled type 2 diabetes mellitus without complication, without long-term current use of insulin ([facility]) - POCT glucose (manual entry)  Patient was given the opportunity to ask questions.  Patient verbalized understanding of the plan and was able to repeat key elements of the plan.   Orders Placed This Encounter  Procedures  . POCT glucose (manual entry)     Requested Prescriptions    No prescriptions requested or ordered in this encounter    Future Appointments  Date Time Provider [place]  10/11/2018 10:00 AM [facility]  LAB 6 [facility] None  10/11/2018 10:30 AM Higgs, Mathis Dad, MD [facility] None    Karle Plumber, MD, Rosalita Chessman

## 2018-06-20 ENCOUNTER — Ambulatory Visit: Payer: Medicare HMO | Attending: Family Medicine | Admitting: Pharmacist

## 2018-06-20 ENCOUNTER — Other Ambulatory Visit: Payer: Self-pay

## 2018-06-20 ENCOUNTER — Encounter: Payer: Self-pay | Admitting: Pharmacist

## 2018-06-20 VITALS — BP 143/82 | HR 61

## 2018-06-20 DIAGNOSIS — I10 Essential (primary) hypertension: Secondary | ICD-10-CM

## 2018-06-20 NOTE — Patient Instructions (Signed)
Thank you for coming to see us today.   Blood pressure today is improving.  Continue taking blood pressure medications as prescribed.   Limiting salt and caffeine, as well as exercising as able for at least 30 minutes for 5 days out of the week, can also help you lower your blood pressure.  Take your blood pressure at home if you are able. Please write down these numbers and bring them to your visits.  If you have any questions about medications, please call me [phone].  Austin Mercer  

## 2018-06-20 NOTE — Progress Notes (Signed)
   S: PCP: Dr. Laural Benes     Patient arrives in good spirits. Presents to the clinic for hypertension management. Patient was referred by Dr. Laural Benes on 06/06/18.  BP 170/100 at that visit - Dr. Laural Benes increased carvedilol.   Patient reports adherence with medications. Has taken morning medications but did not bring meds with him.   Denies chest pain, shortness of breath, HA, or blurred vision.  Current BP Medications include:  Amlodipine 10, carvedilol 6.25 mg BID, furosemide 1 tablet daily, losartan 100 mg daily  Antihypertensives tried in the past include: HCTZ, chlorthalidone (hypercalcemia)  Dietary habits include: consumes energy drinks daily, trying to limit salt Exercise habits include: does not exercise d/t knee pain Family / Social history: DM (father); never smoker, drinks beer, red wine daily  Home BP readings: does not take at home  O:  L arm after 5 minutes rest: 143/82, HR 61  Last 3 Office BP readings: BP Readings from Last 3 Encounters:  06/06/18 (!) 170/100  04/26/18 (!) 186/94  04/12/18 (!) 170/90    BMET    Component Value Date/Time   NA 139 04/26/2018 0926   K 3.7 04/26/2018 0926   CL 102 04/26/2018 0926   CO2 21 04/26/2018 0926   GLUCOSE 122 (H) 04/26/2018 0926   GLUCOSE 160 (H) 03/27/2018 0911   BUN 12 04/26/2018 0926   CREATININE 1.10 04/26/2018 0926   CALCIUM 10.9 (H) 04/26/2018 0926   CALCIUM 11.0 (H) 02/12/2018 1237   GFRNONAA 70 04/26/2018 0926   GFRAA 81 04/26/2018 0926   Renal function: CrCl cannot be calculated (Patient's most recent lab result is older than the maximum 21 days allowed.).  Clinical ASCVD: No  The 10-year ASCVD risk score Denman George DC Jr., et al., 2013) is: 43.5%   Values used to calculate the score:     Age: 68 years     Sex: Male     Is Non-Hispanic African American: Yes     Diabetic: Yes     Tobacco smoker: No     Systolic Blood Pressure: 170 mmHg     Is BP treated: Yes     HDL Cholesterol: 43 mg/dL     Total  Cholesterol: 166 mg/dL  A/P: Hypertension longstanding currently uncontrolled but improved since seeing PCP. BP Goal <130/80 mmHg. Patient is adherent with current medications. Will continue current regimen for now as patient's BP has improved. Additionally, he reports limiting salt since seeing his PCP.  -Continued current regimen.  -Commended patient for limiting sodium intake -Counseled on lifestyle modifications for blood pressure control including reduced dietary sodium, increased exercise, adequate sleep  Results reviewed and written information provided.  Total time in face-to-face counseling 15 minutes.   F/U Clinic Visit in June with PCP.    Patient discussed with:  Melvern Sample, PharmD Candidate  [employer]  Class of 2022  Butch Penny, PharmD, CPP Clinical Pharmacist [facility] [phone]

## 2018-07-08 MED FILL — traMADol HCL 50 MG TABS: 50 | 30 days supply | Qty: 60 | Fill #0

## 2018-07-10 MED FILL — FUROSEMIDE 20 MG TABS: 20 | 30 days supply | Qty: 30 | Fill #2

## 2018-07-10 MED FILL — AMLODIPINE BESYLATE 10 MG T: 10 | 90 days supply | Qty: 90 | Fill #0

## 2018-07-10 MED FILL — CARVEDILOL 3.125 MG TABLET: 3.125 | 30 days supply | Qty: 60 | Fill #2

## 2018-07-11 MED FILL — LOSARTAN POTASSIUM 100 MG T: 100 | 30 days supply | Qty: 30 | Fill #0

## 2018-07-22 ENCOUNTER — Other Ambulatory Visit: Payer: Self-pay | Admitting: Internal Medicine

## 2018-07-22 DIAGNOSIS — I10 Essential (primary) hypertension: Secondary | ICD-10-CM

## 2018-07-25 ENCOUNTER — Emergency Department (HOSPITAL_COMMUNITY)
Admission: EM | Admit: 2018-07-25 | Discharge: 2018-07-25 | Disposition: A | Discharge status: Home / Self Care | Payer: Medicare HMO | Attending: Emergency Medicine | Admitting: Emergency Medicine

## 2018-07-25 ENCOUNTER — Other Ambulatory Visit: Payer: Self-pay

## 2018-07-25 DIAGNOSIS — M1711 Unilateral primary osteoarthritis, right knee: Secondary | ICD-10-CM | POA: Diagnosis not present

## 2018-07-25 DIAGNOSIS — Z87891 Personal history of nicotine dependence: Secondary | ICD-10-CM | POA: Insufficient documentation

## 2018-07-25 DIAGNOSIS — Z760 Encounter for issue of repeat prescription: Secondary | ICD-10-CM | POA: Diagnosis not present

## 2018-07-25 DIAGNOSIS — I10 Essential (primary) hypertension: Secondary | ICD-10-CM | POA: Diagnosis not present

## 2018-07-25 DIAGNOSIS — E119 Type 2 diabetes mellitus without complications: Secondary | ICD-10-CM | POA: Diagnosis not present

## 2018-07-25 DIAGNOSIS — Z7984 Long term (current) use of oral hypoglycemic drugs: Secondary | ICD-10-CM | POA: Diagnosis not present

## 2018-07-25 DIAGNOSIS — Z125 Encounter for screening for malignant neoplasm of prostate: Secondary | ICD-10-CM

## 2018-07-25 MED ORDER — ATORVASTATIN CALCIUM 10 MG PO TABS: 10.0000 mg | ORAL_TABLET | Freq: Every day | ORAL | 3 refills | Status: DC

## 2018-07-25 MED ORDER — AMLODIPINE BESYLATE 10 MG PO TABS: 10.0000 mg | ORAL_TABLET | Freq: Every day | ORAL | 0 refills | Status: DC

## 2018-07-25 MED ORDER — CARVEDILOL 6.25 MG PO TABS: ORAL_TABLET | ORAL | 0 refills | Status: DC

## 2018-07-25 MED ORDER — GLUCOSE BLOOD VI STRP: ORAL_STRIP | 12 refills | Status: DC

## 2018-07-25 MED ORDER — LOSARTAN POTASSIUM 100 MG PO TABS: 100.0000 mg | ORAL_TABLET | Freq: Every day | ORAL | 0 refills | Status: DC

## 2018-07-25 MED ORDER — ACCU-CHEK FASTCLIX LANCETS MISC: 12 refills | Status: DC

## 2018-07-25 MED ORDER — FUROSEMIDE 20 MG PO TABS: 20.0000 mg | ORAL_TABLET | Freq: Every day | ORAL | 3 refills | Status: DC

## 2018-07-25 MED ORDER — METFORMIN HCL 1000 MG PO TABS: 1000.0000 mg | ORAL_TABLET | Freq: Two times a day (BID) | ORAL | 1 refills | Status: DC

## 2018-07-25 MED ORDER — ACCU-CHEK GUIDE W/DEVICE KIT: 1.0000 | PACK | Freq: Every day | 0 refills | Status: DC

## 2018-07-25 NOTE — ED Provider Notes (Signed)
[place] EMERGENCY DEPARTMENT Provider Note   CSN: [id number] Arrival date & time: 07/25/18  1159  History   Chief Complaint Medication Refill  HPI Austin Mercer is a 68 y.o. male with past medical history significant for hypertension, diabetes, osteoarthritis, obesity, hep C presents for evaluation of medication refill.  Patient states he has been out of his blood pressure medications as well as diabetes medications x1 week.  Patient states he was previously going to [facility].  States he tried calling them for a refill of his medications, however cannot get through.  States he tried calling them 1 week ago.  Has not tried calling since.  Patient denies any complaints.  Denies fever, chills, nausea, vomiting, headache, vision changes, neck pain, neck stiffness, chest pain, shortness of breath, abdominal pain, diarrhea, constipation, dysuria, numbness or tingling in his extremities, polyuria and polydipsia.  Patient states he is also out of his blood glucose test strips.  He does not use insulin, however states he checks his blood sugars 1-2 times a day.  States he does currently have some of these left.  States he checked his blood sugar this morning and it was 200 which he states is at his "normal."  Requesting paper copies of his medications.  Denies any additional aggravating or alleviating factors.  History obtained from patient.  No interpreter was used.     HPI  Past Medical History:  Diagnosis Date   Hypertension     Patient Active Problem List   Diagnosis Date Noted   History of alcohol use disorder 04/26/2018   Erectile dysfunction of organic origin 04/26/2018   Positive for macroalbuminuria 01/27/2018   Hypercalcemia 01/24/2018   Controlled type 2 diabetes mellitus without complication, without long-term current use of insulin ([place]) 11/23/2017   Essential hypertension 11/23/2017   Primary osteoarthritis of right knee 11/23/2017    Class 3 severe obesity due to excess calories with serious comorbidity and body mass index (BMI) of 45.0 to 49.9 in adult ([place]) 11/23/2017   Age-related cataract of right eye 11/23/2017   Hepatitis C virus infection cured after antiviral drug therapy 11/23/2017   Chronic, continuous use of opioids 11/23/2017    Past Surgical History:  Procedure Laterality Date   ANKLE FRACTURE SURGERY          Home Medications    Prior to Admission medications   Medication Sig Start Date End Date Taking? Authorizing Provider  Accu-Chek FastClix Lancets MISC Use as directed to test blood sugar once daily. DX: E11.9 07/25/18   Maudine Kluesner A, PA-C  amLODipine (NORVASC) 10 MG tablet Take 1 tablet (10 mg total) by mouth daily. 07/25/18   Yaasir Menken A, PA-C  atorvastatin (LIPITOR) 10 MG tablet Take 1 tablet (10 mg total) by mouth daily. 07/25/18   Sarrinah Gardin A, PA-C  Blood Glucose Monitoring Suppl (ACCU-CHEK GUIDE) w/Device KIT 1 each by Does not apply route daily. Use as directed to test blood sugar once daily. DX: E11.9 07/25/18   Dayveon Halley A, PA-C  carvedilol (COREG) 6.25 MG tablet Take 1 tablet p.o. twice a day 07/25/18   Bellarae Lizer A, PA-C  chlordiazePOXIDE (LIBRIUM) 25 MG capsule 29m PO TID x 1D, then 25-553mPO BID X 1D, then 25-5065mO QD X 1D 03/27/18   YaoDrenda FreezeD  diclofenac sodium (VOLTAREN) 1 % GEL Apply 2 g topically 4 (four) times daily. Patient taking differently: Apply 2 g topically 4 (four) times daily as  needed.  11/23/17   Ladell Pier, MD  furosemide (LASIX) 20 MG tablet Take 1 tablet (20 mg total) by mouth daily. 07/25/18   Marks Scalera A, PA-C  glucose blood (ACCU-CHEK GUIDE) test strip Use as directed to test blood sugar once daily. DX: E11.9 07/25/18   Chenee Munns A, PA-C  losartan (COZAAR) 100 MG tablet Take 1 tablet (100 mg total) by mouth daily. 07/25/18   Dimarco Minkin A, PA-C  metFORMIN (GLUCOPHAGE) 1000 MG tablet Take 1  tablet (1,000 mg total) by mouth 2 (two) times daily with a meal. 07/25/18   Simpson Paulos A, PA-C  potassium chloride (KLOR-CON) 8 MEQ tablet Take 2 tablets (16 mEq total) by mouth 2 (two) times daily. 01/25/18   Ladell Pier, MD  tadalafil (ADCIRCA/CIALIS) 20 MG tablet 1 tab PO Q 1 hr prior to sex as needed.  Limit use to 1 tab/24 hr 04/26/18   Ladell Pier, MD  traMADol (ULTRAM) 50 MG tablet Take 1 tablet (50 mg total) by mouth 2 (two) times daily as needed. 06/06/18   Ladell Pier, MD  traMADol (ULTRAM) 50 MG tablet Take 1 tablet (50 mg total) by mouth 2 (two) times daily as needed. To fill on or after 07/06/2018 06/06/18   Ladell Pier, MD    Family History Family History  Problem Relation Age of Onset   Diabetes Father     Social History Social History   Tobacco Use   Smoking status: Former Smoker    Last attempt to quit: 1973    Years since quitting: 47.3   Smokeless tobacco: Never Used  Substance Use Topics   Alcohol use: Yes    Comment: Beer, Red wine   Drug use: Not Currently     Allergies   Latex   Review of Systems Review of Systems  Constitutional: Negative.   HENT: Negative.   Eyes: Negative.   Cardiovascular: Negative.   Endocrine: Negative for cold intolerance, heat intolerance, polydipsia, polyphagia and polyuria.  Genitourinary: Negative.   Musculoskeletal: Negative.   Skin: Negative.   Neurological: Negative.   Psychiatric/Behavioral: Negative.   All other systems reviewed and are negative.    Physical Exam Updated Vital Signs BP (!) 174/78 (BP Location: Right Arm)    Pulse 85    Temp 98.1 F (36.7 C) (Oral)    Resp 20    SpO2 97%   Physical Exam Vitals signs and nursing note reviewed.  Constitutional:      General: He is not in acute distress.    Appearance: He is well-developed. He is not ill-appearing, toxic-appearing or diaphoretic.     Comments: Patient talking on phone on initial evaluation.  No acute distress  noted.  HENT:     Head: Normocephalic and atraumatic.     Nose: Nose normal.     Mouth/Throat:     Comments: Posterior oropharynx clear.  Mucous membranes moist.  Missing multiple teeth. Eyes:     Pupils: Pupils are equal, round, and reactive to light.     Comments: No nystagmus.  Neck:     Musculoskeletal: Normal range of motion and neck supple.     Comments: No neck stiffness or neck rigidity.  No meningismus. Cardiovascular:     Rate and Rhythm: Normal rate and regular rhythm.     Comments: No murmurs, rubs or gallops. Pulmonary:     Effort: Pulmonary effort is normal. No respiratory distress.     Comments: Clear to auscultation  bilateral without wheeze, rhonchi or rales.  No accessory muscle usage.  Speaks in full sentences without difficulty. Abdominal:     General: There is no distension.     Palpations: Abdomen is soft.     Comments: Soft, Nontender without rebound or guarding.  Normoactive bowel sounds.  Musculoskeletal: Normal range of motion.     Comments: Moves all 4 extremities without difficulty.  No obvious deformity or injuries.  No lower extremity edema.  Skin:    General: Skin is warm and dry.     Comments: No edema, erythema, ecchymosis or warmth.  Brisk capillary refill.  No lower extremity pitting edema.  Neurological:     Mental Status: He is alert.     Comments: 5/5 strength to BUE and BLE. Ambulatory in department thought difficulty.  Cranial nerves II through XII grossly intact.  No facial droop.  Phonation normal.  Psychiatric:        Mood and Affect: Mood normal.        Behavior: Behavior normal.        Thought Content: Thought content normal.    ED Treatments / Results  Labs (all labs ordered are listed, but only abnormal results are displayed) Labs Reviewed - No data to display  EKG None  Radiology No results found.  Procedures Procedures (including critical care time)  Medications Ordered in ED Medications - No data to  display   Initial Impression / Assessment and Plan / ED Course  I have reviewed the triage vital signs and the nursing notes.  Pertinent labs & imaging results that were available during my care of the patient were reviewed by me and considered in my medical decision making (see chart for details).  68 year old male appears otherwise well presents for evaluation of medication refill.  He is afebrile, nonseptic, non-ill-appearing.  Requesting refill for his diabetes and his blood pressure medicines.  He has been out of these x1 week.  He has been checking his blood glucose levels at home twice daily.  States his level this morning was 200 which is "normal."  He does not have any complaints and is asymptomatic.  No headache, vision changes, nausea, vomiting, chest pain, shortness of breath, abdominal pain, diarrhea, dysuria, polyuria or polydipsia.  She states he was seen [facility] for his care.  Called 1 week ago for a refill of his prescriptions, however states they have not called him back.  Patient states after he tried 1 time he has not tried to recall them for a refill.  Normal musculoskeletal exam.  Neurovascularly intact.  Heart without murmurs, rubs or gallops.  Lungs clear.  Cranial nerves II through XII grossly intact.  He has no lower extremity edema, erythema, ecchymosis or warmth.  He has no obvious signs of DKA on exam.  Patient likely at his baseline.  Will give patient paper copy of his prescriptions.  I have discussed with patient that he should follow-up with his PCP for reevaluation of his medications.  His blood pressure is at his baseline.  I have reviewed his previous visits from his PCP office.  Discussed calling them to follow-up as he was supposed to follow-up for a blood pressure recheck 2 weeks after his last appointment.  I have low suspicion for hypertensive urgency or hypertensive emergency.  Reviewed labs from PCP 1 month ago. At baseline. Visit with Pharmacist on  3/19 with recommendation to continue BP regimen he was on. Normal mood and affect normal.  He has no upper respiratory symptoms.  No headaches.  patient hemodynamically stable and appropriate for DC at this time.  I have discussed strict return precautions.  Patient voiced understanding and is agreeable to follow-up.     Final Clinical Impressions(s) / ED Diagnoses   Final diagnoses:  Medication refill    ED Discharge Orders         Ordered    Accu-Chek FastClix Lancets MISC     07/25/18 1231    amLODipine (NORVASC) 10 MG tablet  Daily     07/25/18 1231    atorvastatin (LIPITOR) 10 MG tablet  Daily     07/25/18 1231    Blood Glucose Monitoring Suppl (ACCU-CHEK GUIDE) w/Device KIT  Daily     07/25/18 1231    carvedilol (COREG) 6.25 MG tablet     07/25/18 1231    furosemide (LASIX) 20 MG tablet  Daily     07/25/18 1231    glucose blood (ACCU-CHEK GUIDE) test strip     07/25/18 1231    losartan (COZAAR) 100 MG tablet  Daily     07/25/18 1231    metFORMIN (GLUCOPHAGE) 1000 MG tablet  2 times daily with meals     07/25/18 1231           Sinclair Alligood A, PA-C 07/25/18 1236    Quintella Reichert, MD 07/26/18 [phone]

## 2018-07-25 NOTE — Discharge Instructions (Signed)
You were evaluated today for medication refill.  I provided you paper copies of your prescriptions.  You will need to follow-up with your PCP for further refills.  Return to the ED for any new or worsening symptoms.

## 2018-07-25 NOTE — ED Triage Notes (Signed)
Pt wants med refills on all his medicines ; denies any symptoms

## 2018-07-25 NOTE — ED Notes (Signed)
Patient verbalizes understanding of discharge instructions. Opportunity for questioning and answering were provided.  patient discharged from ED.  

## 2018-07-29 ENCOUNTER — Other Ambulatory Visit: Payer: Self-pay

## 2018-07-29 DIAGNOSIS — I10 Essential (primary) hypertension: Secondary | ICD-10-CM

## 2018-07-29 DIAGNOSIS — Z125 Encounter for screening for malignant neoplasm of prostate: Secondary | ICD-10-CM

## 2018-07-29 MED ORDER — LOSARTAN POTASSIUM 100 MG PO TABS: 100.0000 mg | ORAL_TABLET | Freq: Every day | ORAL | 1 refills | Status: DC

## 2018-07-29 MED ORDER — ATORVASTATIN CALCIUM 10 MG PO TABS: 10.0000 mg | ORAL_TABLET | Freq: Every day | ORAL | 1 refills | Status: DC

## 2018-07-29 MED ORDER — CARVEDILOL 6.25 MG PO TABS: ORAL_TABLET | ORAL | 1 refills | Status: DC

## 2018-07-29 MED ORDER — METFORMIN HCL 1000 MG PO TABS: 1000.0000 mg | ORAL_TABLET | Freq: Two times a day (BID) | ORAL | 1 refills | Status: DC

## 2018-07-29 MED ORDER — AMLODIPINE BESYLATE 10 MG PO TABS: 10.0000 mg | ORAL_TABLET | Freq: Every day | ORAL | 1 refills | Status: DC

## 2018-07-29 MED FILL — CARVEDILOL 6.25 MG TABLET: 6.25 | 90 days supply | Qty: 180 | Fill #0

## 2018-09-03 ENCOUNTER — Telehealth: Payer: Self-pay | Admitting: Internal Medicine

## 2018-09-03 DIAGNOSIS — M1711 Unilateral primary osteoarthritis, right knee: Secondary | ICD-10-CM

## 2018-09-03 NOTE — Telephone Encounter (Signed)
1) Medication(s) Requested (by name): traMADol (ULTRAM) 50 MG tablet [[id number]]   2) Pharmacy of Choice: [facility] - [place], [place] - [address]

## 2018-09-05 MED ORDER — TRAMADOL HCL 50 MG PO TABS: 50.0000 mg | ORAL_TABLET | Freq: Two times a day (BID) | ORAL | 0 refills | Status: DC | PRN

## 2018-09-05 MED FILL — traMADol HCL 50 MG TABS: 50 | 30 days supply | Qty: 60 | Fill #0

## 2018-09-05 NOTE — Telephone Encounter (Signed)
Gave rx to [facility] in [facility]

## 2018-09-06 ENCOUNTER — Other Ambulatory Visit: Payer: Self-pay

## 2018-09-06 ENCOUNTER — Encounter: Payer: Self-pay | Admitting: Internal Medicine

## 2018-09-06 ENCOUNTER — Ambulatory Visit: Payer: Medicare HMO | Attending: Internal Medicine | Admitting: Internal Medicine

## 2018-09-06 VITALS — BP 170/70 | HR 76 | Temp 98.2°F | Resp 16 | Wt 342.6 lb

## 2018-09-06 DIAGNOSIS — E1129 Type 2 diabetes mellitus with other diabetic kidney complication: Secondary | ICD-10-CM | POA: Diagnosis not present

## 2018-09-06 DIAGNOSIS — E119 Type 2 diabetes mellitus without complications: Secondary | ICD-10-CM | POA: Diagnosis not present

## 2018-09-06 DIAGNOSIS — M1711 Unilateral primary osteoarthritis, right knee: Secondary | ICD-10-CM

## 2018-09-06 DIAGNOSIS — R809 Proteinuria, unspecified: Secondary | ICD-10-CM

## 2018-09-06 DIAGNOSIS — I10 Essential (primary) hypertension: Secondary | ICD-10-CM

## 2018-09-06 DIAGNOSIS — E66813 Obesity, class 3: Secondary | ICD-10-CM

## 2018-09-06 DIAGNOSIS — R197 Diarrhea, unspecified: Secondary | ICD-10-CM

## 2018-09-06 DIAGNOSIS — Z6841 Body Mass Index (BMI) 40.0 and over, adult: Secondary | ICD-10-CM

## 2018-09-06 LAB — POCT GLYCOSYLATED HEMOGLOBIN (HGB A1C): HbA1c, POC (controlled diabetic range): 6.7 % (ref 0.0–7.0)

## 2018-09-06 LAB — GLUCOSE, POCT (MANUAL RESULT ENTRY): POC Glucose: 133 mg/dl — AB (ref 70–99)

## 2018-09-06 MED ORDER — LOSARTAN POTASSIUM 100 MG PO TABS: 100.0000 mg | ORAL_TABLET | Freq: Every day | ORAL | 2 refills | Status: DC

## 2018-09-06 MED ORDER — FUROSEMIDE 20 MG PO TABS: 20.0000 mg | ORAL_TABLET | Freq: Every day | ORAL | 1 refills | Status: DC

## 2018-09-06 MED FILL — FUROSEMIDE 20 MG TABS: 20 | 30 days supply | Qty: 30 | Fill #3

## 2018-09-06 NOTE — Patient Instructions (Signed)
Your blood pressure is not controlled.  It looks like you have not picked up 2 of your blood pressure medications in over a month.  These include the Losartan and furosemide.  Please stop at the pharmacy today to pick those up.  Please limit the salt in the foods.  You can use salt substitute like garlic salt on Mrs. Dash.  Try to ride your bike at least 3 to 4 days a week.  I have referred you to the weight management program.  They will call you with the appointment.

## 2018-09-06 NOTE — Progress Notes (Signed)
Patient ID: Ephram Kornegay., male    DOB: 1950-10-25  MRN: [id number]  CC: Diabetes and Hypertension   Subjective: Austin Mercer is a 68 y.o. male who presents for chronic ds management His concerns today include:  Pt gives hx of severe RT OA of knee, lower back pain (had x-ray that showed arthritis), HTN, DM with macroalbumin, obesity, hep C (treated at [place], cured), ED.  Last seen 06/2018  Pt did no bring meds with him despite my CMA leaving message for him yesterday reminding him to bring meds.  Patient very talkative and swears intermittently  Had some diarrhea this a.m while here in the office. He was almost incontinent.  Patient reports that he ate some oatmeal this morning and this always occurs when he eats oatmeal.  Reports similar episodes in the past.  Usually has 2 bowel movements a day that are usually soft.    HTN: reports compliance with meds and took already for today.  However he is unable to tell me the names of the medicines that he is on.  I did check with the pharmacy to see his refill history.  He had carvedilol filled last month with a 64-monthsupply, amlodipine filled in April with a 358-monthupply.  Cozaar was last filled early April with a 30-day supply and furosemide was last filled early April also with a 30-day supply. Uses salt in foods No CP, SOB.  + swelling in RT leg which he states is all the time.  No PND orthopnea   DM:  Dose not check BS regularly.  Reports compliance with metformin.  He does not think metformin causes diarrhea for him.  He has a stationary bike at home which he just started using again.  He does not get out and walk because he does not feel his neighborhood is safe to do so.  OA knee:  Completed shots Flexogenics.  He found them helpful RT knee worse than left.  Reports he [facility] told they would not do it until he has lost some weight.   No falls   Patient Active Problem  List   Diagnosis Date Noted  . History of alcohol use disorder 04/26/2018  . 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 Positive for macroalbuminuria 01/27/2018  . Hypercalcemia 01/24/2018  . Controlled type 2 diabetes mellitus without complication, without long-term current use of insulin ([place]/23/[place]  . Essential hypertension 11/23/2017  . Primary osteoarthritis of right knee 11/23/2017  . Class 3 severe obesity due to excess calories with serious comorbidity and body mass index (BMI) of 45.0 to 49.9 in adult ([place]/23/2019  . Age-related cataract of right eye 11/23/2017  . Hepatitis C virus infection cured after antiviral drug therapy 11/23/2017  . Chronic, continuous use of opioids 11/23/2017     Current Outpatient Medications on File Prior to Visit  Medication Sig Dispense Refill  . Accu-Chek FastClix Lancets MISC Use as directed to test blood sugar once daily. DX: E11.9 100 each 12  . amLODipine (NORVASC) 10 MG tablet Take 1 tablet (10 mg total) by mouth daily. 90 tablet 1  . atorvastatin (LIPITOR) 10 MG tablet Take 1 tablet (10 mg total) by mouth daily. 90 tablet 1  . Blood Glucose Monitoring Suppl (ACCU-CHEK GUIDE) w/Device KIT 1 each by Does not apply route daily. Use as directed to test blood sugar once daily. DX: E11.9 1 kit 0  .  carvedilol (COREG) 6.25 MG tablet Take 1 tablet p.o. twice a day 180 tablet 1  . diclofenac sodium (VOLTAREN) 1 % GEL Apply 2 g topically 4 (four) times daily. (Patient taking differently: Apply 2 g topically 4 (four) times daily as needed. ) 100 g 3  . glucose blood (ACCU-CHEK GUIDE) test strip Use as directed to test blood sugar once daily. DX: E11.9 100 each 12  . metFORMIN (GLUCOPHAGE) 1000 MG tablet Take 1 tablet (1,000 mg total) by mouth 2 (two) times daily with a meal. 180 tablet 1  . potassium chloride (KLOR-CON) 8 MEQ tablet Take 2 tablets (16 mEq total) by mouth 2 (two) times daily. 120 tablet 6  . tadalafil (ADCIRCA/CIALIS) 20  MG tablet 1 tab PO Q 1 hr prior to sex as needed.  Limit use to 1 tab/24 hr 20 tablet 1  . traMADol (ULTRAM) 50 MG tablet Take 1 tablet (50 mg total) by mouth 2 (two) times daily as needed. To fill on or after 07/06/2018 60 tablet 0  . traMADol (ULTRAM) 50 MG tablet Take 1 tablet (50 mg total) by mouth 2 (two) times daily as needed. 60 tablet 0   No current facility-administered medications on file prior to visit.     Allergies  Allergen Reactions  . Latex Itching    Social History   Socioeconomic History  . Marital status: Divorced    Spouse name: Not on file  . Number of children: 2  . Years of education: Not on file  . Highest education level: Not on file  Occupational History  . Occupation: retired  Scientific laboratory technician  . Financial resource strain: Not on file  . Food insecurity:    Worry: Not on file    Inability: Not on file  . Transportation needs:    Medical: Not on file    Non-medical: Not on file  Tobacco Use  . Smoking status: Former Smoker    Last attempt to quit: 1973    Years since quitting: 47.4  . Smokeless tobacco: Never Used  Substance and Sexual Activity  . Alcohol use: Yes    Comment: Beer, Red wine  . Drug use: Not Currently  . Sexual activity: Not on file  Lifestyle  . Physical activity:    Days per week: Not on file    Minutes per session: Not on file  . Stress: Not on file  Relationships  . Social connections:    Talks on phone: Not on file    Gets together: Not on file    Attends religious service: Not on file    Active member of club or organization: Not on file    Attends meetings of clubs or organizations: Not on file    Relationship status: Not on file  . Intimate partner violence:    Fear of current or ex partner: Not on file    Emotionally abused: Not on file    Physically abused: Not on file    Forced sexual activity: Not on file  Other Topics Concern  . Not on file  Social History Narrative  . Not on file    Family History   Problem Relation Age of Onset  . Diabetes Father     Past Surgical History:  Procedure Laterality Date  . ANKLE FRACTURE SURGERY      ROS: Review of Systems Negative except as stated above  PHYSICAL EXAM: BP (!) 170/70   Pulse 76   Temp 98.2 F (36.8 C) (Oral)  Resp 16   Wt (!) 342 lb 9.6 oz (155.4 kg)   SpO2 98%   BMI 45.20 kg/m   Wt Readings from Last 3 Encounters:  09/06/18 (!) 342 lb 9.6 oz (155.4 kg)  06/06/18 (!) 339 lb 12.8 oz (154.1 kg)  04/26/18 (!) 345 lb 6.4 oz (156.7 kg)   Repeat BP BP 170/70 Physical Exam General appearance - alert, morbidly obese older African-American male, and in no distress Mental status -patient is friendly, talkative and swears intermittently Mouth - mucous membranes moist, pharynx normal without lesions Neck - supple, no significant adenopathy Chest - clear to auscultation, no wheezes, rales or rhonchi, symmetric air entry Heart - normal rate, regular rhythm, normal S1, S2, no murmurs, rubs, clicks or gallops Musculoskeletal -knees: Moderate joint enlargement right greater than left.  Mild to moderate crepitus on passive range of motion on the right.  mild crepitus with passive range of motion on the left. extremities -trace to 1+ bilateral lower extremity edema  Results for orders placed or performed in visit on 09/06/18  POCT glycosylated hemoglobin (Hb A1C)  Result Value Ref Range   Hemoglobin A1C     HbA1c POC (<> result, manual entry)     HbA1c, POC (prediabetic range)     HbA1c, POC (controlled diabetic range) 6.7 0.0 - 7.0 %  POCT glucose (manual entry)  Result Value Ref Range   POC Glucose 133 (A) 70 - 99 mg/dl    CMP Latest Ref Rng & Units 04/26/2018 03/27/2018 02/12/2018  Glucose 65 - 99 mg/dL 122(H) 160(H) 129(H)  BUN 8 - 27 mg/dL _0 Creatinine 0.76 - 1.27 mg/dL 1.10 1.16 1.07  Sodium 134 - 144 mmol/L 139 136 140  Potassium 3.5 - 5.2 mmol/L 3.7 3.4(L) 3.4(L)  Chloride 96 - 106 mmol/L 102 101 107  CO2 20  - 29 mmol/L _1 Calcium 8.6 - 10.2 mg/dL 10.9(H) 10.6(H) 11.1(H)  Total Protein 6.5 - 8.1 g/dL - 7.6 7.8  Total Bilirubin 0.3 - 1.2 mg/dL - 0.7 0.5  Alkaline Phos 38 - 126 U/L - 41 47  AST 15 - 41 U/L - 27 24  ALT 0 - 44 U/L - 26 32   Lipid Panel     Component Value Date/Time   CHOL 166 11/23/2017 1530   TRIG 148 11/23/2017 1530   HDL 43 11/23/2017 1530   CHOLHDL 3.9 11/23/2017 1530   LDLCALC 93 11/23/2017 1530    CBC    Component Value Date/Time   WBC 8.5 03/27/2018 0911   RBC 5.06 03/27/2018 0911   HGB 13.1 03/27/2018 0911   HGB 12.4 (L) 02/12/2018 1237   HGB 13.9 11/23/2017 1530   HCT 40.9 03/27/2018 0911   HCT 41.0 11/23/2017 1530   PLT 195 03/27/2018 0911   PLT 160 02/12/2018 1237   PLT 181 11/23/2017 1530   MCV 80.8 03/27/2018 0911   MCV 79 11/23/2017 1530   MCH 25.9 (L) 03/27/2018 0911   MCHC 32.0 03/27/2018 0911   RDW 14.8 03/27/2018 0911   RDW 16.2 (H) 11/23/2017 1530   LYMPHSABS 5.2 (H) 03/27/2018 0911   MONOABS 0.8 03/27/2018 0911   EOSABS 0.2 03/27/2018 0911   BASOSABS 0.1 03/27/2018 0911    ASSESSMENT AND PLAN: 1. Controlled type 2 diabetes mellitus with microalbuminuria, without long-term current use of insulin ([place]) -Discussed with patient that metformin can cause diarrhea and we can change to medication.  However patient did not wish to change to  metformin stating that the diarrhea only occurs when he eats oatmeal.  If he changes his mind he will let me know -Discussed and encourage healthy eating habits. Encouraged him to ride his bike as much as tolerated at least 3 to 4 days a week if only for 10 minutes -Patient had macroalbuminuria when last checked in October.  We will recheck today to see whether it has improved but I must bear in mind that he may have been off Cozaar for at least a month based on his last refill history - POCT glycosylated hemoglobin (Hb A1C) - POCT glucose (manual entry) - Microalbumin / creatinine urine ratio  2.  Essential hypertension Not at goal.  Patient with poor health literacy playing a role. I have sent refills for 90-day supply on the Cozaar and furosemide and instructed him to stop at the pharmacy today to get these medicines filled before he returns home.  Based on his last refill he should be out of both of these medicines - losartan (COZAAR) 100 MG tablet; Take 1 tablet (100 mg total) by mouth daily.  Dispense: 90 tablet; Refill: 2 - furosemide (LASIX) 20 MG tablet; Take 1 tablet (20 mg total) by mouth daily.  Dispense: 90 tablet; Refill: 1  3. Class 3 severe obesity due to excess calories with serious comorbidity and body mass index (BMI) of 45.0 to 49.9 in adult [facility]) See #1 above.  He is agreeable to being referred to medical weight management - Amb Ref to Medical Weight Management  4. Primary osteoarthritis of right knee Stressed the importance of weight loss. Continue tramadol which he takes as needed and reports adequate improvement in pain to allow him to function.  Controlled substance prescribing agreement is up-to-date. [place] his last request for refill.  5. Diarrhea, unspecified type See discussion under #1 above    Patient was given the opportunity to ask questions.  Patient verbalized understanding of the plan and was able to repeat key elements of the plan.   Orders Placed This Encounter  Procedures  . Microalbumin / creatinine urine ratio  . Amb Ref to Medical Weight Management  . POCT glycosylated hemoglobin (Hb A1C)  . POCT glucose (manual entry)     Requested Prescriptions   Signed Prescriptions Disp Refills  . losartan (COZAAR) 100 MG tablet 90 tablet 2    Sig: Take 1 tablet (100 mg total) by mouth daily.  . furosemide (LASIX) 20 MG tablet 90 tablet 1    Sig: Take 1 tablet (20 mg total) by mouth daily.    No follow-ups on file.  Karle Plumber, MD, FACP

## 2018-09-09 DIAGNOSIS — E1129 Type 2 diabetes mellitus with other diabetic kidney complication: Secondary | ICD-10-CM | POA: Diagnosis not present

## 2018-09-09 DIAGNOSIS — R809 Proteinuria, unspecified: Secondary | ICD-10-CM | POA: Diagnosis not present

## 2018-09-10 LAB — MICROALBUMIN / CREATININE URINE RATIO
Creatinine, Urine: 77.7 mg/dL
Microalb/Creat Ratio: 852 mg/g creat — ABNORMAL HIGH (ref 0–29)
Microalbumin, Urine: 661.8 ug/mL

## 2018-09-12 ENCOUNTER — Telehealth: Payer: Self-pay

## 2018-09-12 NOTE — Telephone Encounter (Signed)
Contacted pt to go over urine results pt didn't answer and was unable to lvm due to vm being full.

## 2018-09-23 ENCOUNTER — Ambulatory Visit: Payer: Medicare HMO | Admitting: Internal Medicine

## 2018-10-07 ENCOUNTER — Other Ambulatory Visit: Payer: Self-pay | Admitting: Internal Medicine

## 2018-10-08 NOTE — Telephone Encounter (Signed)
No VM option. Placed at the front for pick up

## 2018-10-11 ENCOUNTER — Inpatient Hospital Stay: Payer: Medicare HMO | Attending: Internal Medicine

## 2018-10-11 ENCOUNTER — Inpatient Hospital Stay: Payer: Medicare HMO | Admitting: Internal Medicine

## 2018-10-22 ENCOUNTER — Other Ambulatory Visit: Payer: Self-pay | Admitting: Internal Medicine

## 2018-10-24 MED FILL — traMADol HCL 50 MG TABS: 50 | 30 days supply | Qty: 60 | Fill #0

## 2018-10-25 DIAGNOSIS — E119 Type 2 diabetes mellitus without complications: Secondary | ICD-10-CM | POA: Diagnosis not present

## 2018-12-05 ENCOUNTER — Ambulatory Visit: Payer: Medicare HMO | Attending: Internal Medicine | Admitting: Internal Medicine

## 2018-12-10 ENCOUNTER — Ambulatory Visit: Payer: Medicare HMO | Admitting: Internal Medicine

## 2018-12-16 MED FILL — FUROSEMIDE 20 MG TABS: 20 | 30 days supply | Qty: 30 | Fill #0

## 2019-01-17 ENCOUNTER — Encounter: Payer: Self-pay | Admitting: Internal Medicine

## 2019-01-17 ENCOUNTER — Ambulatory Visit (HOSPITAL_BASED_OUTPATIENT_CLINIC_OR_DEPARTMENT_OTHER): Payer: Medicare HMO | Admitting: Pharmacist

## 2019-01-17 ENCOUNTER — Other Ambulatory Visit: Payer: Self-pay

## 2019-01-17 ENCOUNTER — Ambulatory Visit: Payer: Medicare HMO | Attending: Internal Medicine | Admitting: Internal Medicine

## 2019-01-17 VITALS — BP 194/88 | HR 75 | Temp 98.3°F | Resp 16 | Wt 339.8 lb

## 2019-01-17 DIAGNOSIS — E1129 Type 2 diabetes mellitus with other diabetic kidney complication: Secondary | ICD-10-CM | POA: Diagnosis not present

## 2019-01-17 DIAGNOSIS — I10 Essential (primary) hypertension: Secondary | ICD-10-CM

## 2019-01-17 DIAGNOSIS — M1711 Unilateral primary osteoarthritis, right knee: Secondary | ICD-10-CM

## 2019-01-17 DIAGNOSIS — Z1211 Encounter for screening for malignant neoplasm of colon: Secondary | ICD-10-CM

## 2019-01-17 DIAGNOSIS — Z23 Encounter for immunization: Secondary | ICD-10-CM | POA: Diagnosis not present

## 2019-01-17 DIAGNOSIS — L602 Onychogryphosis: Secondary | ICD-10-CM

## 2019-01-17 DIAGNOSIS — R809 Proteinuria, unspecified: Secondary | ICD-10-CM

## 2019-01-17 LAB — GLUCOSE, POCT (MANUAL RESULT ENTRY): POC Glucose: 158 mg/dl — AB (ref 70–99)

## 2019-01-17 LAB — POCT GLYCOSYLATED HEMOGLOBIN (HGB A1C): HbA1c, POC (prediabetic range): 6.7 % — AB (ref 5.7–6.4)

## 2019-01-17 MED ORDER — LOSARTAN POTASSIUM 100 MG PO TABS: 100.0000 mg | ORAL_TABLET | Freq: Every day | ORAL | 2 refills | Status: DC

## 2019-01-17 MED ORDER — ATORVASTATIN CALCIUM 10 MG PO TABS: 10.0000 mg | ORAL_TABLET | Freq: Every day | ORAL | 1 refills | Status: DC

## 2019-01-17 MED ORDER — METFORMIN HCL 1000 MG PO TABS: 1000.0000 mg | ORAL_TABLET | Freq: Two times a day (BID) | ORAL | 1 refills | Status: DC

## 2019-01-17 MED ORDER — AMLODIPINE BESYLATE 10 MG PO TABS: 10.0000 mg | ORAL_TABLET | Freq: Every day | ORAL | 1 refills | Status: DC

## 2019-01-17 MED ORDER — FUROSEMIDE 20 MG PO TABS: 20.0000 mg | ORAL_TABLET | Freq: Every day | ORAL | 1 refills | Status: DC

## 2019-01-17 MED ORDER — TRAMADOL HCL 50 MG PO TABS: 50.0000 mg | ORAL_TABLET | Freq: Two times a day (BID) | ORAL | 1 refills | Status: DC | PRN

## 2019-01-17 MED FILL — traMADol HCL 50 MG TABS: 50 | 7 days supply | Qty: 14 | Fill #0

## 2019-01-17 NOTE — Patient Instructions (Signed)
Influenza Virus Vaccine injection (Fluarix) What is this medicine? INFLUENZA VIRUS VACCINE (in floo EN zuh VAHY ruhs vak SEEN) helps to reduce the risk of getting influenza also known as the flu. This medicine may be used for other purposes; ask your health care provider or pharmacist if you have questions. COMMON BRAND NAME(S): Fluarix, Fluzone What should I tell my health care provider before I take this medicine? They need to know if you have any of these conditions:  bleeding disorder like hemophilia  fever or infection  Guillain-Barre syndrome or other neurological problems  immune system problems  infection with the human immunodeficiency virus (HIV) or AIDS  low blood platelet counts  multiple sclerosis  an unusual or allergic reaction to influenza virus vaccine, eggs, chicken proteins, latex, gentamicin, other medicines, foods, dyes or preservatives  pregnant or trying to get pregnant  breast-feeding How should I use this medicine? This vaccine is for injection into a muscle. It is given by a health care professional. A copy of Vaccine Information Statements will be given before each vaccination. Read this sheet carefully each time. The sheet may change frequently. Talk to your pediatrician regarding the use of this medicine in children. Special care may be needed. Overdosage: If you think you have taken too much of this medicine contact a poison control center or emergency room at once. NOTE: This medicine is only for you. Do not share this medicine with others. What if I miss a dose? This does not apply. What may interact with this medicine?  chemotherapy or radiation therapy  medicines that lower your immune system like etanercept, anakinra, infliximab, and adalimumab  medicines that treat or prevent blood clots like warfarin  phenytoin  steroid medicines like prednisone or cortisone  theophylline  vaccines This list may not describe all possible  interactions. Give your health care provider a list of all the medicines, herbs, non-prescription drugs, or dietary supplements you use. Also tell them if you smoke, drink alcohol, or use illegal drugs. Some items may interact with your medicine. What should I watch for while using this medicine? Report any side effects that do not go away within 3 days to your doctor or health care professional. Call your health care provider if any unusual symptoms occur within 6 weeks of receiving this vaccine. You may still catch the flu, but the illness is not usually as bad. You cannot get the flu from the vaccine. The vaccine will not protect against colds or other illnesses that may cause fever. The vaccine is needed every year. What side effects may I notice from receiving this medicine? Side effects that you should report to your doctor or health care professional as soon as possible:  allergic reactions like skin rash, itching or hives, swelling of the face, lips, or tongue Side effects that usually do not require medical attention (report to your doctor or health care professional if they continue or are bothersome):  fever  headache  muscle aches and pains  pain, tenderness, redness, or swelling at site where injected  weak or tired This list may not describe all possible side effects. Call your doctor for medical advice about side effects. You may report side effects to FDA at 1-800-FDA-1088. Where should I keep my medicine? This vaccine is only given in a clinic, pharmacy, doctor's office, or other health care setting and will not be stored at home. NOTE: This sheet is a summary. It may not cover all possible information. If you have questions   about this medicine, talk to your doctor, pharmacist, or health care provider.  2020 Elsevier/Gold Standard (2007-10-16 09:30:40)  

## 2019-01-17 NOTE — Progress Notes (Signed)
Patient presents for vaccination against influenza per orders of Dr. Johnson. Consent given. Counseling provided. No contraindications exists. Vaccine administered without incident.   

## 2019-01-17 NOTE — Progress Notes (Signed)
° ° °Patient ID: Austin Blanks Jr., male    DOB: 12/11/1950  MRN: [id number] ° °CC: Diabetes and Hypertension ° ° °Subjective: °Austin Mercer is a 68 y.o. male who presents for chronic ds management. °His concerns today include:  °Pt with hx of severe RT OA of knee, lower back pain (had x-ray that showed arthritis), HTN, DM with macroalbumin, obesity, hep C ([facility], cured), ED ° °DIABETES TYPE 2 °Last A1C:   °Results for orders placed or performed in visit on 01/17/19  °POCT glucose (manual entry)  °Result Value Ref Range  ° POC Glucose 158 (A) 70 - 99 mg/dl  °POCT glycosylated hemoglobin (Hb A1C)  °Result Value Ref Range  ° Hemoglobin A1C    ° HbA1c POC (<> result, manual entry)    ° HbA1c, POC (prediabetic range) 6.7 (A) 5.7 - 6.4 %  ° HbA1c, POC (controlled diabetic range)    °  °Med Adherence:  [x] Yes.  He is on metformin    [] No °Medication side effects:  Reports no further diarrhea episode as was experience on last office visit.  He tells me that he has diarrhea only when he eats oatmeal in the mornings. °Home Monitoring?  [x] Yes but only 2 x a mth   [] No °Home glucose results range: °Diet Adherence:  Eats in the middle of the night usually oatmeal    °Exercise: [x] Yes -he gets out to grocery store a few times a week to walk around °Hypoglycemic episodes?: [] Yes    [] No °Numbness of the feet? [] Yes    [x] No °Retinopathy hx? [] Yes    [x] No °Last eye exam:  Feels his vision is better °Comments:  ° °OA:  request RF on Tramadol.  "It makes me limber." Takes Aleve PRN about every other day when he does not have tramadol.  He reports good pain relief with tramadol when he takes it but has been out of it for over a month.  He denies any falls. ° °HTN: Initially patient told me that he is taking his medications consistently every day.  However I checked with the pharmacist again to see when he last picked up his medications.  Most of them or picked up in April or May as a 3-month supply and not refilled  since then.   °-After being told of this information, patient then admitted that he does not take his blood pressure medicines every day.  He states he does not take them on the days when he takes Aleve which is about every other day.   ° °HM:  We gave flu shot today.  Reports he mailed in FITT test this yr but we did not receive it °Patient Active Problem List  ° Diagnosis Date Noted  °• History of alcohol use disorder 04/26/2018  °• Erectile dysfunction of organic origin 04/26/2018  °• Positive for macroalbuminuria 01/27/2018  °• Hypercalcemia 01/24/2018  °• Controlled type 2 diabetes mellitus without complication, without long-term current use of insulin (HCC) 11/23/2017  °• Essential hypertension 11/23/2017  °• Primary osteoarthritis of right knee 11/23/2017  °• Class 3 severe obesity due to excess calories with serious comorbidity and body mass index (BMI) of 45.0 to 49.9 in adult (HCC) 11/23/2017  °• Age-related cataract of right eye 11/23/2017  °• Hepatitis C virus infection cured after antiviral drug therapy 11/23/2017  °• Chronic, continuous use of opioids 11/23/2017  °  ° °Current Outpatient Medications on File Prior   to Visit  °Medication Sig Dispense Refill  °• Accu-Chek FastClix Lancets MISC Use as directed to test blood sugar once daily. DX: E11.9 100 each 12  °• amLODipine (NORVASC) 10 MG tablet Take 1 tablet (10 mg total) by mouth daily. 90 tablet 1  °• atorvastatin (LIPITOR) 10 MG tablet Take 1 tablet (10 mg total) by mouth daily. 90 tablet 1  °• Blood Glucose Monitoring Suppl (ACCU-CHEK GUIDE) w/Device KIT 1 each by Does not apply route daily. Use as directed to test blood sugar once daily. DX: E11.9 1 kit 0  °• carvedilol (COREG) 6.25 MG tablet Take 1 tablet p.o. twice a day 180 tablet 1  °• diclofenac sodium (VOLTAREN) 1 % GEL Apply 2 g topically 4 (four) times daily. (Patient taking differently: Apply 2 g topically 4 (four) times daily as needed. ) 100 g 3  °• furosemide (LASIX) 20 MG tablet Take  1 tablet (20 mg total) by mouth daily. 90 tablet 1  °• glucose blood (ACCU-CHEK GUIDE) test strip Use as directed to test blood sugar once daily. DX: E11.9 100 each 12  °• losartan (COZAAR) 100 MG tablet Take 1 tablet (100 mg total) by mouth daily. 90 tablet 2  °• metFORMIN (GLUCOPHAGE) 1000 MG tablet Take 1 tablet (1,000 mg total) by mouth 2 (two) times daily with a meal. 180 tablet 1  °• potassium chloride (KLOR-CON) 8 MEQ tablet Take 2 tablets (16 mEq total) by mouth 2 (two) times daily. 120 tablet 6  °• tadalafil (ADCIRCA/CIALIS) 20 MG tablet 1 tab PO Q 1 hr prior to sex as needed.  Limit use to 1 tab/24 hr 20 tablet 1  °• traMADol (ULTRAM) 50 MG tablet Take 1 tablet (50 mg total) by mouth 2 (two) times daily as needed. To fill on or after 07/06/2018 60 tablet 0  °• traMADol (ULTRAM) 50 MG tablet Take 1 tablet (50 mg total) by mouth 2 (two) times daily as needed. 60 tablet 0  °• traMADol (ULTRAM) 50 MG tablet TAKE 1 TABLET BY MOUTH TWO TIMES DAILY AS NEEDED 60 tablet 0  ° °No current facility-administered medications on file prior to visit.   ° ° °Allergies  °Allergen Reactions  °• Latex Itching  ° ° °Social History  ° °Socioeconomic History  °• Marital status: Divorced  °  Spouse name: Not on file  °• Number of children: 2  °• Years of education: Not on file  °• Highest education level: Not on file  °Occupational History  °• Occupation: retired  °Social Needs  °• Financial resource strain: Not on file  °• Food insecurity  °  Worry: Not on file  °  Inability: Not on file  °• Transportation needs  °  Medical: Not on file  °  Non-medical: Not on file  °Tobacco Use  °• Smoking status: Former Smoker  °  Quit date: 1973  °  Years since quitting: 47.8  °• Smokeless tobacco: Never Used  °Substance and Sexual Activity  °• Alcohol use: Yes  °  Comment: Beer, Red wine  °• Drug use: Not Currently  °• Sexual activity: Not on file  °Lifestyle  °• Physical activity  °  Days per week: Not on file  °  Minutes per session: Not on  file  °• Stress: Not on file  °Relationships  °• Social connections  °  Talks on phone: Not on file  °  Gets together: Not on file  °  Attends religious service: Not on file  °    file    Active member of club or organization: Not on file    Attends meetings of clubs or organizations: Not on file    Relationship status: Not on file   Intimate partner violence    Fear of current or ex partner: Not on file    Emotionally abused: Not on file    Physically abused: Not on file    Forced sexual activity: Not on file  Other Topics Concern   Not on file  Social History Narrative   Not on file    Family History  Problem Relation Age of Onset   Diabetes Father     Past Surgical History:  Procedure Laterality Date   ANKLE FRACTURE SURGERY      ROS: Review of Systems Negative except as stated above  PHYSICAL EXAM: BP (!) 194/88    Pulse 75    Temp 98.3 F (36.8 C) (Oral)    Resp 16    Wt (!) 339 lb 12.8 oz (154.1 kg)    SpO2 99%    BMI 44.83 kg/m   Wt Readings from Last 3 Encounters:  01/17/19 (!) 339 lb 12.8 oz (154.1 kg)  09/06/18 (!) 342 lb 9.6 oz (155.4 kg)  06/06/18 (!) 339 lb 12.8 oz (154.1 kg)    Physical Exam  General appearance - alert, well appearing, and in no distress Mental status - normal mood, behavior, speech, dress, motor activity, and thought processes Neck - supple, no significant adenopathy Chest - clear to auscultation, no wheezes, rales or rhonchi, symmetric air entry Heart - normal rate, regular rhythm, normal S1, S2, no murmurs, rubs, clicks or gallops Musculoskeletal -knees: Mild enlargement of the knee joint.  No point tenderness. Extremities -no lower extremity edema. Diabetic Foot Exam - Simple   Simple Foot Form Visual Inspection See comments: Yes Sensation Testing Intact to touch and monofilament testing bilaterally: Yes Pulse Check Posterior Tibialis and Dorsalis pulse intact bilaterally: Yes Comments Toenails are overgrown.     CMP Latest Ref  Rng & Units 04/26/2018 03/27/2018 02/12/2018  Glucose 65 - 99 mg/dL 122(H) 160(H) 129(H)  BUN 8 - 27 mg/dL _0 Creatinine 0.76 - 1.27 mg/dL 1.10 1.16 1.07  Sodium 134 - 144 mmol/L 139 136 140  Potassium 3.5 - 5.2 mmol/L 3.7 3.4(L) 3.4(L)  Chloride 96 - 106 mmol/L 102 101 107  CO2 20 - 29 mmol/L _1 Calcium 8.6 - 10.2 mg/dL 10.9(H) 10.6(H) 11.1(H)  Total Protein 6.5 - 8.1 g/dL - 7.6 7.8  Total Bilirubin 0.3 - 1.2 mg/dL - 0.7 0.5  Alkaline Phos 38 - 126 U/L - 41 47  AST 15 - 41 U/L - 27 24  ALT 0 - 44 U/L - 26 32   Lipid Panel     Component Value Date/Time   CHOL 166 11/23/2017 1530   TRIG 148 11/23/2017 1530   HDL 43 11/23/2017 1530   CHOLHDL 3.9 11/23/2017 1530   LDLCALC 93 11/23/2017 1530    CBC    Component Value Date/Time   WBC 8.5 03/27/2018 0911   RBC 5.06 03/27/2018 0911   HGB 13.1 03/27/2018 0911   HGB 12.4 (L) 02/12/2018 1237   HGB 13.9 11/23/2017 1530   HCT 40.9 03/27/2018 0911   HCT 41.0 11/23/2017 1530   PLT 195 03/27/2018 0911   PLT 160 02/12/2018 1237   PLT 181 11/23/2017 1530   MCV 80.8 03/27/2018 0911   MCV 79 11/23/2017 1530   MCH  MCHC 32.0 03/27/2018 0911  ° RDW 14.8 03/27/2018 0911  ° RDW 16.2 (H) 11/23/2017 1530  ° LYMPHSABS 5.2 (H) 03/27/2018 0911  ° MONOABS 0.8 03/27/2018 0911  ° EOSABS 0.2 03/27/2018 0911  ° BASOSABS 0.1 03/27/2018 0911  ° ° °ASSESSMENT AND PLAN: °1. Controlled type 2 diabetes mellitus with microalbuminuria, without long-term current use of insulin (HCC) °At goal.  Continue metformin.  Dietary counseling given.  Advised to avoid eating through the night. °- POCT glucose (manual entry) °- POCT glycosylated hemoglobin (Hb A1C) °- metFORMIN (GLUCOPHAGE) 1000 MG tablet; Take 1 tablet (1,000 mg total) by mouth 2 (two) times daily with a meal.  Dispense: 180 tablet; Refill: 1 °- atorvastatin (LIPITOR) 10 MG tablet; Take 1 tablet (10 mg total) by mouth daily.  Dispense: 90 tablet; Refill: 1 ° °2. Essential  hypertension °Not at goal due to noncompliance with medication. °Encourage patient to take his medications every day.  Discussed health risks associated with uncontrolled blood pressure °- amLODipine (NORVASC) 10 MG tablet; Take 1 tablet (10 mg total) by mouth daily.  Dispense: 90 tablet; Refill: 1 °- furosemide (LASIX) 20 MG tablet; Take 1 tablet (20 mg total) by mouth daily.  Dispense: 90 tablet; Refill: 1 °- losartan (COZAAR) 100 MG tablet; Take 1 tablet (100 mg total) by mouth daily.  Dispense: 90 tablet; Refill: 2 ° °3. Class 3 severe obesity due to excess calories with serious comorbidity and body mass index (BMI) of 40.0 to 44.9 in adult (HCC) °See #1 above ° °4. Overgrown nail °- Ambulatory referral to Podiatry ° °5. Primary osteoarthritis of right knee °He is doing okay on tramadol without significant side effects.  No aberrant behavior.  Refill given on tramadol after reviewing the [place] controlled substance reporting system. °Advised to stop the Advil °- traMADol (ULTRAM) 50 MG tablet; Take 1 tablet (50 mg total) by mouth every 12 (twelve) hours as needed.  Dispense: 60 tablet; Refill: 1 ° °6. Need for influenza vaccination °Given ° °7. Screening for colon cancer °- Fecal occult blood, imunochemical([facility]) ° ° ° ° °Patient was given the opportunity to ask questions.  Patient verbalized understanding of the plan and was able to repeat key elements of the plan.  ° °Orders Placed This Encounter  °Procedures  °• POCT glucose (manual entry)  °• POCT glycosylated hemoglobin (Hb A1C)  ° ° ° °Requested Prescriptions  ° ° No prescriptions requested or ordered in this encounter  ° ° °No follow-ups on file. ° °Deborah Johnson, MD, FACP °

## 2019-03-03 MED FILL — LOSARTAN POTASSIUM 100 MG T: 100 | 90 days supply | Qty: 90 | Fill #0

## 2019-03-10 MED FILL — traMADol HCL 50 MG TABS: 50 | 7 days supply | Qty: 14 | Fill #1

## 2019-03-17 MED FILL — metFORMIN HCL 1000 MG TABS: 1000 | 90 days supply | Qty: 180 | Fill #0

## 2019-03-17 MED FILL — traMADol HCL 50 MG TABS: 50 | 7 days supply | Qty: 14 | Fill #2

## 2019-04-01 MED FILL — traMADol HCL 50 MG TABS: 50 | 7 days supply | Qty: 14 | Fill #3

## 2019-05-06 ENCOUNTER — Other Ambulatory Visit: Payer: Self-pay | Admitting: Internal Medicine

## 2019-05-06 DIAGNOSIS — M1711 Unilateral primary osteoarthritis, right knee: Secondary | ICD-10-CM

## 2019-05-06 MED FILL — FUROSEMIDE 20 MG TABS: 20 | 90 days supply | Qty: 90 | Fill #0

## 2019-05-09 MED FILL — traMADol HCL 50 MG TABS: 50 | 30 days supply | Qty: 60 | Fill #0

## 2019-08-01 ENCOUNTER — Other Ambulatory Visit: Payer: Self-pay

## 2019-08-01 ENCOUNTER — Encounter: Payer: Self-pay | Admitting: Internal Medicine

## 2019-08-01 ENCOUNTER — Ambulatory Visit: Payer: Medicare HMO | Attending: Internal Medicine | Admitting: Internal Medicine

## 2019-08-01 VITALS — BP 183/111 | HR 74 | Temp 98.4°F | Resp 16 | Wt 322.8 lb

## 2019-08-01 DIAGNOSIS — I10 Essential (primary) hypertension: Secondary | ICD-10-CM

## 2019-08-01 DIAGNOSIS — R809 Proteinuria, unspecified: Secondary | ICD-10-CM | POA: Diagnosis not present

## 2019-08-01 DIAGNOSIS — Z1211 Encounter for screening for malignant neoplasm of colon: Secondary | ICD-10-CM | POA: Diagnosis not present

## 2019-08-01 DIAGNOSIS — M170 Bilateral primary osteoarthritis of knee: Secondary | ICD-10-CM | POA: Diagnosis not present

## 2019-08-01 DIAGNOSIS — N5201 Erectile dysfunction due to arterial insufficiency: Secondary | ICD-10-CM | POA: Diagnosis not present

## 2019-08-01 DIAGNOSIS — E1129 Type 2 diabetes mellitus with other diabetic kidney complication: Secondary | ICD-10-CM

## 2019-08-01 DIAGNOSIS — Z6841 Body Mass Index (BMI) 40.0 and over, adult: Secondary | ICD-10-CM | POA: Diagnosis not present

## 2019-08-01 DIAGNOSIS — L602 Onychogryphosis: Secondary | ICD-10-CM

## 2019-08-01 LAB — POCT GLYCOSYLATED HEMOGLOBIN (HGB A1C): HbA1c, POC (controlled diabetic range): 6.6 % (ref 0.0–7.0)

## 2019-08-01 LAB — GLUCOSE, POCT (MANUAL RESULT ENTRY): POC Glucose: 95 mg/dl (ref 70–99)

## 2019-08-01 MED ORDER — FUROSEMIDE 20 MG PO TABS: 20.0000 mg | ORAL_TABLET | Freq: Every day | ORAL | 1 refills | Status: DC

## 2019-08-01 MED ORDER — METFORMIN HCL 1000 MG PO TABS: 1000.0000 mg | ORAL_TABLET | Freq: Two times a day (BID) | ORAL | 1 refills | Status: DC

## 2019-08-01 MED ORDER — CARVEDILOL 6.25 MG PO TABS: ORAL_TABLET | ORAL | 1 refills | Status: DC

## 2019-08-01 MED ORDER — POTASSIUM CHLORIDE ER 8 MEQ PO TBCR: 16.0000 meq | EXTENDED_RELEASE_TABLET | Freq: Two times a day (BID) | ORAL | 6 refills | Status: DC

## 2019-08-01 MED ORDER — ATORVASTATIN CALCIUM 10 MG PO TABS: 10.0000 mg | ORAL_TABLET | Freq: Every day | ORAL | 1 refills | Status: DC

## 2019-08-01 MED ORDER — LOSARTAN POTASSIUM 100 MG PO TABS: 100.0000 mg | ORAL_TABLET | Freq: Every day | ORAL | 2 refills | Status: DC

## 2019-08-01 MED ORDER — AMLODIPINE BESYLATE 10 MG PO TABS: 10.0000 mg | ORAL_TABLET | Freq: Every day | ORAL | 1 refills | Status: DC

## 2019-08-01 MED FILL — metFORMIN HCL 1000 MG TABS: 1000 | 90 days supply | Qty: 180 | Fill #0

## 2019-08-01 MED FILL — ATORVASTATIN 10 MG TABLET: 10 | 90 days supply | Qty: 90 | Fill #0

## 2019-08-01 MED FILL — FUROSEMIDE 20 MG TABS: 20 | 90 days supply | Qty: 90 | Fill #0

## 2019-08-01 MED FILL — CARVEDILOL 6.25 MG TABLET: 6.25 | 90 days supply | Qty: 180 | Fill #0

## 2019-08-01 MED FILL — AMLODIPINE BESYLATE 10 MG T: 10 | 90 days supply | Qty: 90 | Fill #0

## 2019-08-01 MED FILL — POTASSIUM CHLORIDE ER 8 MEQ: 8 | 30 days supply | Qty: 120 | Fill #0

## 2019-08-01 MED FILL — LOSARTAN POTASSIUM 100 MG T: 100 | 90 days supply | Qty: 90 | Fill #0

## 2019-08-01 NOTE — Progress Notes (Signed)
Patient ID: Austin Mercer., male    DOB: [birth year]  MRN: [id number]  CC: Medication Refill, Diabetes, and Hypertension   Subjective: Austin Mercer is a 69 y.o. male who presents for chronic disease management. His concerns today include:  Pt with hx of severe RT OA of knee, lower back pain (had x-ray that showed arthritis), HTN, DMwith macroalbumin,obesity,hep C (treated at [place], cured), ED  Patient does not have his medications with him.  DM: not checking BS Reports he is eating better and less Loss 22 lbs since 01/2019 A1C today 6.6 Reports compliance with Metformin BID  HYPERTENSION Currently taking: see medication list.  He is supposed to be on Cozaar, amlodipine, carvedilol and furosemide Med Adherence:    _0  No _"I take them off and on." Medication side effects: _1  Yes    _2  No Adherence with salt restriction: _3  Yes    _4  No Home Monitoring?: _5  Yes    _6  No Monitoring Frequency: _7  Yes    _8  No Home BP results range: _9  Yes    _10  No SOB? _11  Yes    _12  No Chest Pain?: _13  Yes    _14  No Leg swelling?: _15  Yes    _16  No Headaches?: _17  Yes    _18  No Dizziness? _19  Yes    _20  No Comments:   OA of the knees:  Voltaren gel works well with the tramadol.    ED:  Saw urologist at [facility].  Tried with Viagra and did not work.  He would like to see the urologist outside of the [place] to discuss other options.  HM:  Had COVID vaccine through [place] last mth.  Over due for colon CA screen.  He tells me that he is to get the Cologuard test through the [place] but has not had it in a while.  Eye exam is showing is overdue, however patient tells me that he gets his eye exam done regularly at the [place] and also gets his glasses there.  We have tried in the past to get records from the [place]. Patient Active Problem List   Diagnosis Date Noted  . History of alcohol use disorder 04/26/2018  . 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 Positive for macroalbuminuria 01/27/2018  . Hypercalcemia  01/24/2018  . Controlled type 2 diabetes mellitus without complication, without long-term current use of insulin ([place]) 11/23/2017  . Essential hypertension 11/23/2017  . Primary osteoarthritis of right knee 11/23/2017  . Class 3 severe obesity due to excess calories with serious comorbidity and body mass index (BMI) of 45.0 to 49.9 in adult ([place]) 11/23/2017  . Age-related cataract of right eye 11/23/2017  . Hepatitis C virus infection cured after antiviral drug therapy 11/23/2017  . Chronic, continuous use of opioids 11/23/2017     Current Outpatient Medications on File Prior to Visit  Medication Sig Dispense Refill  . Accu-Chek FastClix Lancets MISC Use as directed to test blood sugar once daily. DX: E11.9 100 each 12  . Blood Glucose Monitoring Suppl (ACCU-CHEK GUIDE) w/Device KIT 1 each by Does not apply route daily. Use as directed to test blood sugar once daily. DX: E11.9 1 kit 0  . diclofenac sodium (VOLTAREN) 1 % GEL Apply 2 g topically 4 (four) times daily. (Patient taking differently: Apply 2 g topically 4 (four) times daily as needed. ) 100 g 3  . glucose blood (ACCU-CHEK GUIDE) test strip Use as directed to test blood sugar once daily. DX: E11.9 100 each  12  . tadalafil (ADCIRCA/CIALIS) 20 MG tablet 1 tab PO Q 1 hr prior to sex as needed.  Limit use to 1 tab/24 hr 20 tablet 1  . traMADol (ULTRAM) 50 MG tablet TAKE 1 TABLET (50 MG TOTAL) BY MOUTH EVERY 12 (TWELVE) HOURS AS NEEDED. 60 tablet 0   No current facility-administered medications on file prior to visit.    Allergies  Allergen Reactions  . Latex Itching    Social History   Socioeconomic History  . Marital status: Divorced    Spouse name: Not on file  . Number of children: 2  . Years of education: Not on file  . Highest education level: Not on file  Occupational History  . Occupation: retired  Tobacco Use  . Smoking status: Former Smoker    Quit date: 1973    Years since quitting: 48.3  . Smokeless tobacco:  Never Used  Substance and Sexual Activity  . Alcohol use: Yes    Comment: Beer, Red wine  . Drug use: Not Currently  . Sexual activity: Not on file  Other Topics Concern  . Not on file  Social History Narrative  . Not on file   Social Determinants of Health   Financial Resource Strain:   . Difficulty of Paying Living Expenses:   Food Insecurity:   . Worried About Charity fundraiser in the Last Year:   . Arboriculturist in the Last Year:   Transportation Needs:   . Film/video editor (Medical):   Marland Kitchen Lack of Transportation (Non-Medical):   Physical Activity:   . Days of Exercise per Week:   . Minutes of Exercise per Session:   Stress:   . Feeling of Stress :   Social Connections:   . Frequency of Communication with Friends and Family:   . Frequency of Social Gatherings with Friends and Family:   . Attends Religious Services:   . Active Member of Clubs or Organizations:   . Attends Archivist Meetings:   Marland Kitchen Marital Status:   Intimate Partner Violence:   . Fear of Current or Ex-Partner:   . Emotionally Abused:   Marland Kitchen Physically Abused:   . Sexually Abused:     Family History  Problem Relation Age of Onset  . Diabetes Father     Past Surgical History:  Procedure Laterality Date  . ANKLE FRACTURE SURGERY      ROS: Review of Systems Negative except as stated above  PHYSICAL EXAM: BP (!) 183/111   Pulse 74   Temp 98.4 F (36.9 C)   Resp 16   Wt (!) 322 lb 12.8 oz (146.4 kg)   SpO2 98%   BMI 42.59 kg/m   Wt Readings from Last 3 Encounters:  08/01/19 (!) 322 lb 12.8 oz (146.4 kg)  01/17/19 (!) 339 lb 12.8 oz (154.1 kg)  09/06/18 (!) 342 lb 9.6 oz (155.4 kg)    Physical Exam  General appearance - alert, well appearing, obese older African-American male and in no distress Mental status - normal mood, behavior, speech, dress, motor activity, and thought processes Neck - supple, no significant adenopathy Chest - clear to auscultation, no wheezes,  rales or rhonchi, symmetric air entry Heart - normal rate, regular rhythm, normal S1, S2, no murmurs, rubs, clicks or gallops Extremities - peripheral pulses normal, no pedal edema, no clubbing or cyanosis Diabetic Foot Exam - Simple   Simple Foot Form Visual Inspection See comments: Yes Sensation Testing Intact to touch  and monofilament testing bilaterally: Yes Pulse Check Posterior Tibialis and Dorsalis pulse intact bilaterally: Yes Comments Several of his toenails are overgrown and curled over.  Toenails are discolored.     CMP Latest Ref Rng & Units 04/26/2018 03/27/2018 02/12/2018  Glucose 65 - 99 mg/dL 122(H) 160(H) 129(H)  BUN 8 - 27 mg/dL _0 Creatinine 0.76 - 1.27 mg/dL 1.10 1.16 1.07  Sodium 134 - 144 mmol/L 139 136 140  Potassium 3.5 - 5.2 mmol/L 3.7 3.4(L) 3.4(L)  Chloride 96 - 106 mmol/L 102 101 107  CO2 20 - 29 mmol/L _1 Calcium 8.6 - 10.2 mg/dL 10.9(H) 10.6(H) 11.1(H)  Total Protein 6.5 - 8.1 g/dL - 7.6 7.8  Total Bilirubin 0.3 - 1.2 mg/dL - 0.7 0.5  Alkaline Phos 38 - 126 U/L - 41 47  AST 15 - 41 U/L - 27 24  ALT 0 - 44 U/L - 26 32   Lipid Panel     Component Value Date/Time   CHOL 166 11/23/2017 1530   TRIG 148 11/23/2017 1530   HDL 43 11/23/2017 1530   CHOLHDL 3.9 11/23/2017 1530   LDLCALC 93 11/23/2017 1530    CBC    Component Value Date/Time   WBC 8.5 03/27/2018 0911   RBC 5.06 03/27/2018 0911   HGB 13.1 03/27/2018 0911   HGB 12.4 (L) 02/12/2018 1237   HGB 13.9 11/23/2017 1530   HCT 40.9 03/27/2018 0911   HCT 41.0 11/23/2017 1530   PLT 195 03/27/2018 0911   PLT 160 02/12/2018 1237   PLT 181 11/23/2017 1530   MCV 80.8 03/27/2018 0911   MCV 79 11/23/2017 1530   MCH 25.9 (L) 03/27/2018 0911   MCHC 32.0 03/27/2018 0911   RDW 14.8 03/27/2018 0911   RDW 16.2 (H) 11/23/2017 1530   LYMPHSABS 5.2 (H) 03/27/2018 0911   MONOABS 0.8 03/27/2018 0911   EOSABS 0.2 03/27/2018 0911   BASOSABS 0.1 03/27/2018 0911    ASSESSMENT AND  PLAN: 1. Controlled type 2 diabetes mellitus with microalbuminuria, without long-term current use of insulin (HCC) Continue Metformin and healthy eating habits.  Commended him on weight loss. - POCT glucose (manual entry) - POCT glycosylated hemoglobin (Hb A1C) - atorvastatin (LIPITOR) 10 MG tablet; Take 1 tablet (10 mg total) by mouth daily.  Dispense: 90 tablet; Refill: 1 - metFORMIN (GLUCOPHAGE) 1000 MG tablet; Take 1 tablet (1,000 mg total) by mouth 2 (two) times daily with a meal.  Dispense: 180 tablet; Refill: 1 - CBC - Comprehensive metabolic panel - Lipid panel  2. Essential hypertension Not at goal.  I suspect due to noncompliance with medications.  Encouraged him to adhere to his regimen to prevent cardiovascular events.  Encouraged him to bring his medicines with him on next visit - amLODipine (NORVASC) 10 MG tablet; Take 1 tablet (10 mg total) by mouth daily.  Dispense: 90 tablet; Refill: 1 - losartan (COZAAR) 100 MG tablet; Take 1 tablet (100 mg total) by mouth daily.  Dispense: 90 tablet; Refill: 2 - carvedilol (COREG) 6.25 MG tablet; Take 1 tablet p.o. twice a day  Dispense: 180 tablet; Refill: 1 - furosemide (LASIX) 20 MG tablet; Take 1 tablet (20 mg total) by mouth daily.  Dispense: 90 tablet; Refill: 1 - potassium chloride (KLOR-CON) 8 MEQ tablet; Take 2 tablets (16 mEq total) by mouth 2 (two) times daily.  Dispense: 120 tablet; Refill: 6  3. Class 3 severe obesity due to excess calories with serious comorbidity and body  mass index (BMI) of 40.0 to 44.9 in adult [facility]) See #1 above  4. Overgrown toenails I recommended referral to podiatry but patient declined stating that he gets it done at the [place]  5. Erectile dysfunction due to arterial insufficiency - Ambulatory referral to Urology  6. Screening for colon cancer Agreeable for [place] to order the Cologuard test for him - Cologuard  7. Primary osteoarthritis of both knees Doing well on Voltaren gel and tramadol as  needed    Patient was given the opportunity to ask questions.  Patient verbalized understanding of the plan and was able to repeat key elements of the plan.   Orders Placed This Encounter  Procedures  . CBC  . Comprehensive metabolic panel  . Lipid panel  . Cologuard  . Ambulatory referral to Urology  . POCT glucose (manual entry)  . POCT glycosylated hemoglobin (Hb A1C)     Requested Prescriptions   Signed Prescriptions Disp Refills  . amLODipine (NORVASC) 10 MG tablet 90 tablet 1    Sig: Take 1 tablet (10 mg total) by mouth daily.  Marland Kitchen atorvastatin (LIPITOR) 10 MG tablet 90 tablet 1    Sig: Take 1 tablet (10 mg total) by mouth daily.  Marland Kitchen losartan (COZAAR) 100 MG tablet 90 tablet 2    Sig: Take 1 tablet (100 mg total) by mouth daily.  . metFORMIN (GLUCOPHAGE) 1000 MG tablet 180 tablet 1    Sig: Take 1 tablet (1,000 mg total) by mouth 2 (two) times daily with a meal.  . carvedilol (COREG) 6.25 MG tablet 180 tablet 1    Sig: Take 1 tablet p.o. twice a day  . furosemide (LASIX) 20 MG tablet 90 tablet 1    Sig: Take 1 tablet (20 mg total) by mouth daily.  . potassium chloride (KLOR-CON) 8 MEQ tablet 120 tablet 6    Sig: Take 2 tablets (16 mEq total) by mouth 2 (two) times daily.    Return in about 3 months (around 10/31/2019).  Karle Plumber, MD, FACP

## 2019-08-02 LAB — COMPREHENSIVE METABOLIC PANEL
ALT: 18 IU/L (ref 0–44)
AST: 21 IU/L (ref 0–40)
Albumin/Globulin Ratio: 1.4 (ref 1.2–2.2)
Albumin: 4.1 g/dL (ref 3.8–4.8)
Alkaline Phosphatase: 60 IU/L (ref 39–117)
BUN/Creatinine Ratio: 8 — ABNORMAL LOW (ref 10–24)
BUN: 10 mg/dL (ref 8–27)
Bilirubin Total: 0.5 mg/dL (ref 0.0–1.2)
CO2: 21 mmol/L (ref 20–29)
Calcium: 10.7 mg/dL — ABNORMAL HIGH (ref 8.6–10.2)
Chloride: 103 mmol/L (ref 96–106)
Creatinine, Ser: 1.27 mg/dL (ref 0.76–1.27)
GFR calc Af Amer: 68 mL/min/{1.73_m2} (ref >59)
GFR calc non Af Amer: 58 mL/min/{1.73_m2} — ABNORMAL LOW (ref >59)
Globulin, Total: 3 g/dL (ref 1.5–4.5)
Glucose: 102 mg/dL — ABNORMAL HIGH (ref 65–99)
Potassium: 3.2 mmol/L — ABNORMAL LOW (ref 3.5–5.2)
Sodium: 139 mmol/L (ref 134–144)
Total Protein: 7.1 g/dL (ref 6.0–8.5)

## 2019-08-02 LAB — LIPID PANEL
Chol/HDL Ratio: 4.1 ratio (ref 0.0–5.0)
Cholesterol, Total: 155 mg/dL (ref 100–199)
HDL: 38 mg/dL — ABNORMAL LOW (ref >39)
LDL Chol Calc (NIH): 86 mg/dL (ref 0–99)
Triglycerides: 178 mg/dL — ABNORMAL HIGH (ref 0–149)
VLDL Cholesterol Cal: 31 mg/dL (ref 5–40)

## 2019-08-02 LAB — CBC
Hematocrit: 43.5 % (ref 37.5–51.0)
Hemoglobin: 14.5 g/dL (ref 13.0–17.7)
MCH: 27.7 pg (ref 26.6–33.0)
MCHC: 33.3 g/dL (ref 31.5–35.7)
MCV: 83 fL (ref 79–97)
Platelets: 167 10*3/uL (ref 150–450)
RBC: 5.24 x10E6/uL (ref 4.14–5.80)
RDW: 15.2 % (ref 11.6–15.4)
WBC: 7.7 10*3/uL (ref 3.4–10.8)

## 2019-08-02 NOTE — Progress Notes (Signed)
Let patient know that his blood count is normal meaning no anemia.  His potassium level is low.  Please verify that he is taking the potassium supplement 2 tablets daily.  If he has been taking it consistently then we will need to increase it to 3 tablets a day.  Please let me know.  Calcium level is still mildly elevated but stable.  His cholesterol level is okay.

## 2019-08-06 ENCOUNTER — Telehealth: Payer: Self-pay

## 2019-08-06 NOTE — Telephone Encounter (Signed)
Contacted pt to go over lab results pt didn't answer and was unable to lvm due to vm being full  

## 2019-08-18 LAB — COLOGUARD

## 2019-08-20 ENCOUNTER — Encounter: Payer: Self-pay | Admitting: Internal Medicine

## 2019-08-20 NOTE — Progress Notes (Signed)
Received a [facility] from [facility] letting me know that the Cologuard specimen that the patient sent in was not acceptable for testing.  They will contact the patient to initiate a new sample collection.

## 2019-09-04 ENCOUNTER — Telehealth: Payer: Self-pay | Admitting: Internal Medicine

## 2019-09-04 DIAGNOSIS — M1711 Unilateral primary osteoarthritis, right knee: Secondary | ICD-10-CM

## 2019-09-04 MED ORDER — TRAMADOL HCL 50 MG PO TABS: 50.0000 mg | ORAL_TABLET | Freq: Two times a day (BID) | ORAL | 0 refills | Status: DC | PRN

## 2019-09-04 NOTE — Telephone Encounter (Signed)
[facility] has been trying to contact pt for several days now and no response. Both phone numbers on file have been called multiple times to discuss his Cologuard order from [facility]. If Patient calls please provide patient with the following info  -reference number [id number] -Phone-[phone]

## 2019-09-04 NOTE — Telephone Encounter (Signed)
1) Medication(s) Requested (by name): Tramadol  ° °2) Pharmacy of Choice: [facility] pharmacy  ° °3) Special Requests: ° ° °Approved medications will be sent to the pharmacy, we will reach out if there is an issue. ° °Requests made after 3pm may not be addressed until the following business day! ° °If a patient is unsure of the name of the medication(s) please note and ask patient to call back when they are able to provide all info, do not send to responsible party until all information is available! ° °

## 2019-09-05 MED FILL — traMADol HCL 50 MG TABS: 50 | 7 days supply | Qty: 14 | Fill #0

## 2019-10-07 MED FILL — traMADol HCL 50 MG TABS: 50 | 7 days supply | Qty: 14 | Fill #1

## 2019-10-17 MED FILL — traMADol HCL 50 MG TABS: 50 | 7 days supply | Qty: 14 | Fill #2

## 2019-10-24 MED FILL — traMADol HCL 50 MG TABS: 50 | 7 days supply | Qty: 14 | Fill #3

## 2019-10-27 DIAGNOSIS — R948 Abnormal results of function studies of other organs and systems: Secondary | ICD-10-CM | POA: Diagnosis not present

## 2019-10-27 DIAGNOSIS — N5201 Erectile dysfunction due to arterial insufficiency: Secondary | ICD-10-CM | POA: Diagnosis not present

## 2019-10-31 ENCOUNTER — Other Ambulatory Visit: Payer: Self-pay

## 2019-10-31 ENCOUNTER — Ambulatory Visit: Payer: Medicare HMO | Attending: Internal Medicine | Admitting: Internal Medicine

## 2019-11-05 MED FILL — traMADol HCL 50 MG TABS: 50 | 2 days supply | Qty: 4 | Fill #4

## 2019-11-19 ENCOUNTER — Other Ambulatory Visit: Payer: Self-pay | Admitting: Internal Medicine

## 2019-11-19 DIAGNOSIS — M1711 Unilateral primary osteoarthritis, right knee: Secondary | ICD-10-CM

## 2019-11-19 NOTE — Telephone Encounter (Signed)
Requested medication (s) are due for refill today: yes  Requested medication (s) are on the active medication list:yes  Last refill: 09/04/19  #60 0 refills  Future visit scheduled: No  Notes to clinic:Medication not delegated    Requested Prescriptions  Pending Prescriptions Disp Refills   traMADol (ULTRAM) 50 MG tablet [Pharmacy Med Name: traMADol HCL 50 MG TABS 50 Tablet] 4 tablet 0    Sig: Take 1 tablet (50 mg total) by mouth every 12 (twelve) hours as needed.      Not Delegated - Analgesics:  Opioid Agonists Failed - 11/19/2019 11:46 AM      Failed - This refill cannot be delegated      Failed - Urine Drug Screen completed in last 360 days.      Passed - Valid encounter within last 6 months    Recent Outpatient Visits           3 months ago Controlled type 2 diabetes mellitus with microalbuminuria, without long-term current use of insulin (HCC)   [place] [facility] Marcine Matar, MD   10 months ago Need for influenza vaccination   [facility] [place], Jeannett Senior L, RPH-CPP   10 months ago Controlled type 2 diabetes mellitus with microalbuminuria, without long-term current use of insulin [facility])   [place] [facility] Jonah Blue B, MD   1 year ago Controlled type 2 diabetes mellitus with microalbuminuria, without long-term current use of insulin [facility])   [place] [facility] Marcine Matar, MD   1 year ago Essential hypertension   [place] [facility] Marcine Matar, MD

## 2019-11-21 MED FILL — traMADol HCL 50 MG TABS: 50 | 30 days supply | Qty: 60 | Fill #0

## 2020-02-11 ENCOUNTER — Other Ambulatory Visit: Payer: Self-pay

## 2020-02-11 ENCOUNTER — Ambulatory Visit: Payer: Medicare HMO | Attending: Internal Medicine | Admitting: Pharmacist

## 2020-02-11 DIAGNOSIS — Z23 Encounter for immunization: Secondary | ICD-10-CM

## 2020-02-11 NOTE — Progress Notes (Signed)
Patient presents for vaccination against Influenza per orders of Dr. Johnson. Consent given. Counseling provided. No contraindications exists. Vaccine administered without incident.  ° °Austin Mercer, PharmD °PGY2 Ambulatory Care Resident °[place]   [facility]armacy  ° °

## 2020-03-04 ENCOUNTER — Other Ambulatory Visit: Payer: Self-pay

## 2020-03-04 ENCOUNTER — Other Ambulatory Visit: Payer: Self-pay | Admitting: Internal Medicine

## 2020-03-04 ENCOUNTER — Other Ambulatory Visit: Payer: Self-pay | Admitting: Pharmacist

## 2020-03-04 ENCOUNTER — Telehealth: Payer: Self-pay

## 2020-03-04 ENCOUNTER — Encounter: Payer: Self-pay | Admitting: Critical Care Medicine

## 2020-03-04 ENCOUNTER — Ambulatory Visit: Payer: Medicare HMO | Attending: Critical Care Medicine | Admitting: Critical Care Medicine

## 2020-03-04 VITALS — BP 161/109 | HR 78 | Wt 332.0 lb

## 2020-03-04 DIAGNOSIS — R062 Wheezing: Secondary | ICD-10-CM | POA: Diagnosis not present

## 2020-03-04 DIAGNOSIS — Z79891 Long term (current) use of opiate analgesic: Secondary | ICD-10-CM | POA: Insufficient documentation

## 2020-03-04 DIAGNOSIS — M1711 Unilateral primary osteoarthritis, right knee: Secondary | ICD-10-CM | POA: Diagnosis not present

## 2020-03-04 DIAGNOSIS — R600 Localized edema: Secondary | ICD-10-CM | POA: Insufficient documentation

## 2020-03-04 DIAGNOSIS — Z7984 Long term (current) use of oral hypoglycemic drugs: Secondary | ICD-10-CM | POA: Diagnosis not present

## 2020-03-04 DIAGNOSIS — R109 Unspecified abdominal pain: Secondary | ICD-10-CM | POA: Diagnosis not present

## 2020-03-04 DIAGNOSIS — Z79899 Other long term (current) drug therapy: Secondary | ICD-10-CM | POA: Insufficient documentation

## 2020-03-04 DIAGNOSIS — F1190 Opioid use, unspecified, uncomplicated: Secondary | ICD-10-CM

## 2020-03-04 DIAGNOSIS — R059 Cough, unspecified: Secondary | ICD-10-CM | POA: Diagnosis not present

## 2020-03-04 DIAGNOSIS — I160 Hypertensive urgency: Secondary | ICD-10-CM | POA: Diagnosis not present

## 2020-03-04 DIAGNOSIS — Z6841 Body Mass Index (BMI) 40.0 and over, adult: Secondary | ICD-10-CM | POA: Diagnosis not present

## 2020-03-04 DIAGNOSIS — I440 Atrioventricular block, first degree: Secondary | ICD-10-CM | POA: Diagnosis not present

## 2020-03-04 DIAGNOSIS — R809 Proteinuria, unspecified: Secondary | ICD-10-CM | POA: Insufficient documentation

## 2020-03-04 DIAGNOSIS — R140 Abdominal distension (gaseous): Secondary | ICD-10-CM | POA: Insufficient documentation

## 2020-03-04 DIAGNOSIS — Z87891 Personal history of nicotine dependence: Secondary | ICD-10-CM | POA: Insufficient documentation

## 2020-03-04 DIAGNOSIS — R0602 Shortness of breath: Secondary | ICD-10-CM | POA: Insufficient documentation

## 2020-03-04 DIAGNOSIS — F331 Major depressive disorder, recurrent, moderate: Secondary | ICD-10-CM | POA: Diagnosis not present

## 2020-03-04 DIAGNOSIS — I119 Hypertensive heart disease without heart failure: Secondary | ICD-10-CM | POA: Diagnosis not present

## 2020-03-04 DIAGNOSIS — I10 Essential (primary) hypertension: Secondary | ICD-10-CM

## 2020-03-04 DIAGNOSIS — E1129 Type 2 diabetes mellitus with other diabetic kidney complication: Secondary | ICD-10-CM | POA: Diagnosis not present

## 2020-03-04 DIAGNOSIS — F32A Depression, unspecified: Secondary | ICD-10-CM | POA: Insufficient documentation

## 2020-03-04 DIAGNOSIS — E119 Type 2 diabetes mellitus without complications: Secondary | ICD-10-CM

## 2020-03-04 DIAGNOSIS — I493 Ventricular premature depolarization: Secondary | ICD-10-CM | POA: Diagnosis not present

## 2020-03-04 HISTORY — DX: Hypertensive urgency: I16.0

## 2020-03-04 LAB — POCT GLYCOSYLATED HEMOGLOBIN (HGB A1C): Hemoglobin A1C: 6.6 % — AB (ref 4.0–5.6)

## 2020-03-04 LAB — GLUCOSE, POCT (MANUAL RESULT ENTRY): POC Glucose: 141 mg/dl — AB (ref 70–99)

## 2020-03-04 MED ORDER — ACCU-CHEK FASTCLIX LANCETS MISC: 12 refills | Status: DC

## 2020-03-04 MED ORDER — CLONIDINE HCL 0.1 MG PO TABS: 0.2000 mg | ORAL_TABLET | Freq: Once | ORAL | Status: DC

## 2020-03-04 MED ORDER — ACCU-CHEK SOFTCLIX LANCETS MISC: 2 refills | Status: DC

## 2020-03-04 MED ORDER — TRULICITY 0.75 MG/0.5ML ~~LOC~~ SOAJ: 0.7500 mg | SUBCUTANEOUS | 4 refills | Status: DC

## 2020-03-04 MED ORDER — ACCU-CHEK GUIDE VI STRP: ORAL_STRIP | 12 refills | Status: DC

## 2020-03-04 MED ORDER — TRAMADOL HCL 50 MG PO TABS: 50.0000 mg | ORAL_TABLET | Freq: Two times a day (BID) | ORAL | 0 refills | Status: DC | PRN

## 2020-03-04 MED ORDER — CLONIDINE HCL 0.2 MG PO TABS
0.2000 mg | ORAL_TABLET | Freq: Once | ORAL | Status: AC
  Administered 2020-03-04: 0.2 mg via ORAL

## 2020-03-04 MED ORDER — BLOOD PRESSURE MONITOR 7 DEVI: 0 refills | Status: DC

## 2020-03-04 MED ORDER — CARVEDILOL 12.5 MG PO TABS: 12.5000 mg | ORAL_TABLET | Freq: Two times a day (BID) | ORAL | 4 refills | Status: DC

## 2020-03-04 MED ORDER — FUROSEMIDE 40 MG PO TABS: 40.0000 mg | ORAL_TABLET | Freq: Every day | ORAL | 2 refills | Status: DC

## 2020-03-04 MED ORDER — POTASSIUM CHLORIDE ER 8 MEQ PO TBCR: 16.0000 meq | EXTENDED_RELEASE_TABLET | Freq: Two times a day (BID) | ORAL | 6 refills | Status: DC

## 2020-03-04 MED ORDER — METFORMIN HCL 500 MG PO TABS: 500.0000 mg | ORAL_TABLET | Freq: Two times a day (BID) | ORAL | 1 refills | Status: DC

## 2020-03-04 MED ORDER — AMLODIPINE BESYLATE 10 MG PO TABS: 10.0000 mg | ORAL_TABLET | Freq: Every day | ORAL | 1 refills | Status: DC

## 2020-03-04 MED ORDER — ATORVASTATIN CALCIUM 10 MG PO TABS: 10.0000 mg | ORAL_TABLET | Freq: Every day | ORAL | 1 refills | Status: DC

## 2020-03-04 MED ORDER — DICLOFENAC SODIUM 1 % EX GEL: 2.0000 g | Freq: Four times a day (QID) | CUTANEOUS | 4 refills | Status: DC

## 2020-03-04 MED ORDER — LOSARTAN POTASSIUM 100 MG PO TABS: 100.0000 mg | ORAL_TABLET | Freq: Every day | ORAL | 2 refills | Status: DC

## 2020-03-04 MED FILL — ACCU-CHEK GUIDE TEST STRIP: 90 days supply | Qty: 100 | Fill #0

## 2020-03-04 MED FILL — METFORMIN HCL 500 MG TABS: 500 | 90 days supply | Qty: 180 | Fill #0

## 2020-03-04 MED FILL — ACCU-CHEK SOFTCLIX LANCETS: 30 days supply | Qty: 100 | Fill #0

## 2020-03-04 MED FILL — POTASSIUM CHLORIDE ER 8 MEQ: 8 | 30 days supply | Qty: 120 | Fill #0

## 2020-03-04 MED FILL — traMADol HCL 50 MG TABS: 50 | 30 days supply | Qty: 60 | Fill #0

## 2020-03-04 MED FILL — ATORVASTATIN 10 MG TABLET: 10 | 90 days supply | Qty: 90 | Fill #0

## 2020-03-04 MED FILL — FUROSEMIDE 40 MG TAB: 40 | 90 days supply | Qty: 90 | Fill #0

## 2020-03-04 MED FILL — LOSARTAN POTASSIUM 100 MG T: 100 | 90 days supply | Qty: 90 | Fill #0

## 2020-03-04 MED FILL — AMLODIPINE BESYLATE 10 MG T: 10 | 90 days supply | Qty: 90 | Fill #0

## 2020-03-04 MED FILL — CARVEDILOL 12.5 MG TABLET: 12.5 | 30 days supply | Qty: 60 | Fill #0

## 2020-03-04 MED FILL — DICLOFENAC SODIUM 1% GEL: 1 | 12 days supply | Qty: 100 | Fill #0

## 2020-03-04 MED FILL — TRULICITY 0.75 MG/0.5 ML PE: 0.75 | 30 days supply | Qty: 2 | Fill #0

## 2020-03-04 NOTE — Assessment & Plan Note (Signed)
As per hypertension assessment 

## 2020-03-04 NOTE — Assessment & Plan Note (Signed)
Patient on chronic opioid use I checked the drug database no other prescribers will refill the tramadol that he has had previously

## 2020-03-04 NOTE — Patient Instructions (Addendum)
All of your medications have been refilled and sent to our pharmacy  Note you will increase your Coreg to 12.5 mg twice daily and furosemide to 40 mg daily, all other medications are unchanged for your blood pressure  You will reduce your Metformin to 500 mg twice daily and begin Trulicity 1 injection weekly to reduce your glucoses, your hemoglobin A1c was actually quite good today less than seven  A referral to cardiology will be made to evaluate your heart status  Run to arrange for a visiting nurse to come to your home  Prescription for a blood pressure cuff was printed and signed and given to you to take to the drugstore to obtain a blood pressure meter  Return to see Austin Mercer our clinical pharmacist to evaluate your blood pressure and diabetes in 1 week  Return to see Dr. Delford Field again in 1 month  Follow a blood pressure diet and glucose diet as outlined below  A referral to [facility] our Behavioral social worker will be made    Diabetes Mellitus and Nutrition, Adult When you have diabetes (diabetes mellitus), it is very important to have healthy eating habits because your blood sugar (glucose) levels are greatly affected by what you eat and drink. Eating healthy foods in the appropriate amounts, at about the same times every day, can help you:  Control your blood glucose.  Lower your risk of heart disease.  Improve your blood pressure.  Reach or maintain a healthy weight. Every person with diabetes is different, and each person has different needs for a meal plan. Your health care provider may recommend that you work with a diet and nutrition specialist (dietitian) to make a meal plan that is best for you. Your meal plan may vary depending on factors such as:  The calories you need.  The medicines you take.  Your weight.  Your blood glucose, blood pressure, and cholesterol levels.  Your activity level.  Other health conditions you have, such as heart or kidney disease. How  do carbohydrates affect me? Carbohydrates, also called carbs, affect your blood glucose level more than any other type of food. Eating carbs naturally raises the amount of glucose in your blood. Carb counting is a method for keeping track of how many carbs you eat. Counting carbs is important to keep your blood glucose at a healthy level, especially if you use insulin or take certain oral diabetes medicines. It is important to know how many carbs you can safely have in each meal. This is different for every person. Your dietitian can help you calculate how many carbs you should have at each meal and for each snack. Foods that contain carbs include:  Bread, cereal, rice, pasta, and crackers.  Potatoes and corn.  Peas, beans, and lentils.  Milk and yogurt.  Fruit and juice.  Desserts, such as cakes, cookies, ice cream, and candy. How does alcohol affect me? Alcohol can cause a sudden decrease in blood glucose (hypoglycemia), especially if you use insulin or take certain oral diabetes medicines. Hypoglycemia can be a life-threatening condition. Symptoms of hypoglycemia (sleepiness, dizziness, and confusion) are similar to symptoms of having too much alcohol. If your health care provider says that alcohol is safe for you, follow these guidelines:  Limit alcohol intake to no more than 1 drink per day for nonpregnant women and 2 drinks per day for men. One drink equals 12 oz of beer, 5 oz of wine, or 1 oz of hard liquor.  Do not drink  on an empty stomach.  Keep yourself hydrated with water, diet soda, or unsweetened iced tea.  Keep in mind that regular soda, juice, and other mixers may contain a lot of sugar and must be counted as carbs. What are tips for following this plan?  Reading food labels  Start by checking the serving size on the "Nutrition Facts" label of packaged foods and drinks. The amount of calories, carbs, fats, and other nutrients listed on the label is based on one serving  of the item. Many items contain more than one serving per package.  Check the total grams (g) of carbs in one serving. You can calculate the number of servings of carbs in one serving by dividing the total carbs by 15. For example, if a food has 30 g of total carbs, it would be equal to 2 servings of carbs.  Check the number of grams (g) of saturated and trans fats in one serving. Choose foods that have low or no amount of these fats.  Check the number of milligrams (mg) of salt (sodium) in one serving. Most people should limit total sodium intake to less than 2,300 mg per day.  Always check the nutrition information of foods labeled as "low-fat" or "nonfat". These foods may be higher in added sugar or refined carbs and should be avoided.  Talk to your dietitian to identify your daily goals for nutrients listed on the label. Shopping  Avoid buying canned, premade, or processed foods. These foods tend to be high in fat, sodium, and added sugar.  Shop around the outside edge of the grocery store. This includes fresh fruits and vegetables, bulk grains, fresh meats, and fresh dairy. Cooking  Use low-heat cooking methods, such as baking, instead of high-heat cooking methods like deep frying.  Cook using healthy oils, such as olive, canola, or sunflower oil.  Avoid cooking with butter, cream, or high-fat meats. Meal planning  Eat meals and snacks regularly, preferably at the same times every day. Avoid going long periods of time without eating.  Eat foods high in fiber, such as fresh fruits, vegetables, beans, and whole grains. Talk to your dietitian about how many servings of carbs you can eat at each meal.  Eat 4-6 ounces (oz) of lean protein each day, such as lean meat, chicken, fish, eggs, or tofu. One oz of lean protein is equal to: ? 1 oz of meat, chicken, or fish. ? 1 egg. ?  cup of tofu.  Eat some foods each day that contain healthy fats, such as avocado, nuts, seeds, and  fish. Lifestyle  Check your blood glucose regularly.  Exercise regularly as told by your health care provider. This may include: ? 150 minutes of moderate-intensity or vigorous-intensity exercise each week. This could be brisk walking, biking, or water aerobics. ? Stretching and doing strength exercises, such as yoga or weightlifting, at least 2 times a week.  Take medicines as told by your health care provider.  Do not use any products that contain nicotine or tobacco, such as cigarettes and e-cigarettes. If you need help quitting, ask your health care provider.  Work with a Veterinary surgeon or diabetes educator to identify strategies to manage stress and any emotional and social challenges. Questions to ask a health care provider  Do I need to meet with a diabetes educator?  Do I need to meet with a dietitian?  What number can I call if I have questions?  When are the best times to check my blood  glucose? Where to find more information:  American Diabetes Association: diabetes.org  Academy of Nutrition and Dietetics: www.eatright.[employer]  National Institute of Diabetes and Digestive and Kidney Diseases (NIH): [employer].tnwww.niddk.nih.gov Summary  A healthy meal plan will help you control your blood glucose and maintain a healthy lifestyle.  Working with a diet and nutrition specialist (dietitian) can help you make a meal plan that is best for you.  Keep in mind that carbohydrates (carbs) and alcohol have immediate effects on your blood glucose levels. It is important to count carbs and to use alcohol carefully. This information is not intended to replace advice given to you by your health care provider. Make sure you discuss any questions you have with your health care provider. Document Revised: 03/02/2017 Document Reviewed: 04/24/2016 Elsevier Patient Education  2020 [employer]  Hypertension, Adult High blood pressure (hypertension) is when the force of blood pumping through the arteries  is too strong. The arteries are the blood vessels that carry blood from the heart throughout the body. Hypertension forces the heart to work harder to pump blood and may cause arteries to become narrow or stiff. Untreated or uncontrolled hypertension can cause a heart attack, heart failure, a stroke, kidney disease, and other problems. A blood pressure reading consists of a higher number over a lower number. Ideally, your blood pressure should be below 120/80. The first ("top") number is called the systolic pressure. It is a measure of the pressure in your arteries as your heart beats. The second ("bottom") number is called the diastolic pressure. It is a measure of the pressure in your arteries as the heart relaxes. What are the causes? The exact cause of this condition is not known. There are some conditions that result in or are related to high blood pressure. What increases the risk? Some risk factors for high blood pressure are under your control. The following factors may make you more likely to develop this condition:  Smoking.  Having type 2 diabetes mellitus, high cholesterol, or both.  Not getting enough exercise or physical activity.  Being overweight.  Having too much fat, sugar, calories, or salt (sodium) in your diet.  Drinking too much alcohol. Some risk factors for high blood pressure may be difficult or impossible to change. Some of these factors include:  Having chronic kidney disease.  Having a family history of high blood pressure.  Age. Risk increases with age.  Race. You may be at higher risk if you are African American.  Gender. Men are at higher risk than women before age 67. After age 67, women are at higher risk than men.  Having obstructive sleep apnea.  Stress. What are the signs or symptoms? High blood pressure may not cause symptoms. Very high blood pressure (hypertensive crisis) may cause:  Headache.  Anxiety.  Shortness of  breath.  Nosebleed.  Nausea and vomiting.  Vision changes.  Severe chest pain.  Seizures. How is this diagnosed? This condition is diagnosed by measuring your blood pressure while you are seated, with your arm resting on a flat surface, your legs uncrossed, and your feet flat on the floor. The cuff of the blood pressure monitor will be placed directly against the skin of your upper arm at the level of your heart. It should be measured at least twice using the same arm. Certain conditions can cause a difference in blood pressure between your right and left arms. Certain factors can cause blood pressure readings to be lower or higher than normal for  a short period of time:  When your blood pressure is higher when you are in a health care provider's office than when you are at home, this is called white coat hypertension. Most people with this condition do not need medicines.  When your blood pressure is higher at home than when you are in a health care provider's office, this is called masked hypertension. Most people with this condition may need medicines to control blood pressure. If you have a high blood pressure reading during one visit or you have normal blood pressure with other risk factors, you may be asked to:  Return on a different day to have your blood pressure checked again.  Monitor your blood pressure at home for 1 week or longer. If you are diagnosed with hypertension, you may have other blood or imaging tests to help your health care provider understand your overall risk for other conditions. How is this treated? This condition is treated by making healthy lifestyle changes, such as eating healthy foods, exercising more, and reducing your alcohol intake. Your health care provider may prescribe medicine if lifestyle changes are not enough to get your blood pressure under control, and if:  Your systolic blood pressure is above 130.  Your diastolic blood pressure is above  80. Your personal target blood pressure may vary depending on your medical conditions, your age, and other factors. Follow these instructions at home: Eating and drinking   Eat a diet that is high in fiber and potassium, and low in sodium, added sugar, and fat. An example eating plan is called the DASH (Dietary Approaches to Stop Hypertension) diet. To eat this way: ? Eat plenty of fresh fruits and vegetables. Try to fill one half of your plate at each meal with fruits and vegetables. ? Eat whole grains, such as whole-wheat pasta, brown rice, or whole-grain bread. Fill about one fourth of your plate with whole grains. ? Eat or drink low-fat dairy products, such as skim milk or low-fat yogurt. ? Avoid fatty cuts of meat, processed or cured meats, and poultry with skin. Fill about one fourth of your plate with lean proteins, such as fish, chicken without skin, beans, eggs, or tofu. ? Avoid pre-made and processed foods. These tend to be higher in sodium, added sugar, and fat.  Reduce your daily sodium intake. Most people with hypertension should eat less than 1,500 mg of sodium a day.  Do not drink alcohol if: ? Your health care provider tells you not to drink. ? You are pregnant, may be pregnant, or are planning to become pregnant.  If you drink alcohol: ? Limit how much you use to:  0-1 drink a day for women.  0-2 drinks a day for men. ? Be aware of how much alcohol is in your drink. In the [place] one drink equals one 12 oz bottle of beer (355 mL), one 5 oz glass of wine (148 mL), or one 1 oz glass of hard liquor (44 mL). Lifestyle   Work with your health care provider to maintain a healthy body weight or to lose weight. Ask what an ideal weight is for you.  Get at least 30 minutes of exercise most days of the week. Activities may include walking, swimming, or biking.  Include exercise to strengthen your muscles (resistance exercise), such as Pilates or lifting weights, as part of  your weekly exercise routine. Try to do these types of exercises for 30 minutes at least 3 days a week.  Do  not use any products that contain nicotine or tobacco, such as cigarettes, e-cigarettes, and chewing tobacco. If you need help quitting, ask your health care provider.  Monitor your blood pressure at home as told by your health care provider.  Keep all follow-up visits as told by your health care provider. This is important. Medicines  Take over-the-counter and prescription medicines only as told by your health care provider. Follow directions carefully. Blood pressure medicines must be taken as prescribed.  Do not skip doses of blood pressure medicine. Doing this puts you at risk for problems and can make the medicine less effective.  Ask your health care provider about side effects or reactions to medicines that you should watch for. Contact a health care provider if you:  Think you are having a reaction to a medicine you are taking.  Have headaches that keep coming back (recurring).  Feel dizzy.  Have swelling in your ankles.  Have trouble with your vision. Get help right away if you:  Develop a severe headache or confusion.  Have unusual weakness or numbness.  Feel faint.  Have severe pain in your chest or abdomen.  Vomit repeatedly.  Have trouble breathing. Summary  Hypertension is when the force of blood pumping through your arteries is too strong. If this condition is not controlled, it may put you at risk for serious complications.  Your personal target blood pressure may vary depending on your medical conditions, your age, and other factors. For most people, a normal blood pressure is less than 120/80.  Hypertension is treated with lifestyle changes, medicines, or a combination of both. Lifestyle changes include losing weight, eating a healthy, low-sodium diet, exercising more, and limiting alcohol. This information is not intended to replace advice given  to you by your health care provider. Make sure you discuss any questions you have with your health care provider. Document Revised: 11/28/2017 Document Reviewed: 11/28/2017 Elsevier Patient Education  2020 [employer].

## 2020-03-04 NOTE — Progress Notes (Signed)
Subjective:    Patient ID: Austin Mercer., male    DOB: 01-16-1951, 69 y.o.   MRN: [id number]  69 y.o.M retired veteran primary care patient of Dr. Wynetta Emery has not been seen since April of this year has run out of his medications for approximately a week and on arrival has a blood pressure of 199/113.  He has significant increased dyspnea with this and weight gain of 10 pounds since the last visit.  He notes bilateral lower extremity edema and swelling.  He has polyuria polydipsia.  He has a productive cough with some wheezing and abdominal distention abdominal pain frequent bowel movements that are formed.  He relates the bowel movement situation to the use of the Metformin which he is on 1000 mg twice daily.  Note on arrival the patient's hemoglobin A1c is actually quite good at 6.6.  Patient does note orthopnea. Does note bilateral chronic knee pain as well. Patient states on the Metformin to 1000 g twice daily he has increased bowel movements  Patient also complains of dysphoric mood however no plans for suicidal ideation  Wt Readings from Last 3 Encounters:  03/04/20 (!) 332 lb (150.6 kg)  08/01/19 (!) 322 lb 12.8 oz (146.4 kg)  01/17/19 (!) 339 lb 12.8 oz (154.1 kg)   Past Medical History:  Diagnosis Date  . Hypertension      Family History  Problem Relation Age of Onset  . Diabetes Father      Social History   Socioeconomic History  . Marital status: Divorced    Spouse name: Not on file  . Number of children: 2  . Years of education: Not on file  . Highest education level: Not on file  Occupational History  . Occupation: retired  Tobacco Use  . Smoking status: Former Smoker    Quit date: 1973    Years since quitting: 48.9  . Smokeless tobacco: Never Used  Vaping Use  . Vaping Use: Never used  Substance and Sexual Activity  . Alcohol use: Yes    Comment: Beer, Red wine  . Drug use: Not Currently  . Sexual activity: Not on file  Other Topics Concern  . Not on  file  Social History Narrative  . Not on file   Social Determinants of Health   Financial Resource Strain:   . Difficulty of Paying Living Expenses: Not on file  Food Insecurity:   . Worried About Charity fundraiser in the Last Year: Not on file  . Ran Out of Food in the Last Year: Not on file  Transportation Needs:   . Lack of Transportation (Medical): Not on file  . Lack of Transportation (Non-Medical): Not on file  Physical Activity:   . Days of Exercise per Week: Not on file  . Minutes of Exercise per Session: Not on file  Stress:   . Feeling of Stress : Not on file  Social Connections:   . Frequency of Communication with Friends and Family: Not on file  . Frequency of Social Gatherings with Friends and Family: Not on file  . Attends Religious Services: Not on file  . Active Member of Clubs or Organizations: Not on file  . Attends Archivist Meetings: Not on file  . Marital Status: Not on file  Intimate Partner Violence:   . Fear of Current or Ex-Partner: Not on file  . Emotionally Abused: Not on file  . Physically Abused: Not on file  . Sexually Abused: Not on  file     Allergies  Allergen Reactions  . Latex Itching     Outpatient Medications Prior to Visit  Medication Sig Dispense Refill  . Blood Glucose Monitoring Suppl (ACCU-CHEK GUIDE) w/Device KIT 1 each by Does not apply route daily. Use as directed to test blood sugar once daily. DX: E11.9 1 kit 0  . tadalafil (ADCIRCA/CIALIS) 20 MG tablet 1 tab PO Q 1 hr prior to sex as needed.  Limit use to 1 tab/24 hr 20 tablet 1  . Accu-Chek FastClix Lancets MISC Use as directed to test blood sugar once daily. DX: E11.9 100 each 12  . amLODipine (NORVASC) 10 MG tablet Take 1 tablet (10 mg total) by mouth daily. 90 tablet 1  . atorvastatin (LIPITOR) 10 MG tablet Take 1 tablet (10 mg total) by mouth daily. 90 tablet 1  . carvedilol (COREG) 6.25 MG tablet Take 1 tablet p.o. twice a day 180 tablet 1  . diclofenac  sodium (VOLTAREN) 1 % GEL Apply 2 g topically 4 (four) times daily. 100 g 3  . furosemide (LASIX) 20 MG tablet Take 1 tablet (20 mg total) by mouth daily. 90 tablet 1  . glucose blood (ACCU-CHEK GUIDE) test strip Use as directed to test blood sugar once daily. DX: E11.9 100 each 12  . losartan (COZAAR) 100 MG tablet Take 1 tablet (100 mg total) by mouth daily. 90 tablet 2  . metFORMIN (GLUCOPHAGE) 1000 MG tablet Take 1 tablet (1,000 mg total) by mouth 2 (two) times daily with a meal. 180 tablet 1  . potassium chloride (KLOR-CON) 8 MEQ tablet Take 2 tablets (16 mEq total) by mouth 2 (two) times daily. 120 tablet 6  . traMADol (ULTRAM) 50 MG tablet TAKE 1 TABLET (50 MG TOTAL) BY MOUTH EVERY 12 (TWELVE) HOURS AS NEEDED. 60 tablet 0   No facility-administered medications prior to visit.      Review of Systems  HENT: Positive for congestion, postnasal drip and rhinorrhea. Negative for dental problem, drooling, ear discharge, ear pain, sinus pressure, sore throat and trouble swallowing.   Eyes: Negative for visual disturbance.  Respiratory: Positive for cough, shortness of breath and wheezing. Negative for chest tightness.   Cardiovascular: Positive for leg swelling. Negative for chest pain.  Gastrointestinal: Positive for abdominal distention, abdominal pain and nausea. Negative for blood in stool and vomiting.  Endocrine: Positive for polydipsia, polyphagia and polyuria.  Genitourinary: Positive for frequency. Negative for dysuria.  Musculoskeletal: Negative for gait problem and myalgias.       Knee pain   Skin: Negative for rash.  Allergic/Immunologic: Negative for immunocompromised state.  Neurological: Negative for dizziness, tremors, seizures, syncope, speech difficulty, light-headedness, numbness and headaches.  Psychiatric/Behavioral: Positive for dysphoric mood. Negative for self-injury and suicidal ideas. The patient is nervous/anxious.        Objective:   Physical Exam Vitals:    03/04/20 1038 03/04/20 1200 03/04/20 1227  BP: (!) 193/112 (!) 199/108 (!) 161/109  Pulse: 78    SpO2: 96%    Weight: (!) 332 lb (150.6 kg)      Gen: Pleasant, obese, in no distress,  normal affect  ENT: No lesions,  mouth clear,  oropharynx clear, no postnasal drip  Neck: No JVD, no TMG, no carotid bruits  Lungs: No use of accessory muscles, no dullness to percussion, clear without rales or rhonchi  Cardiovascular: RRR, heart sounds normal, no murmur or gallops, 3+ peripheral edema  Abdomen: soft and NT, no HSM,  BS normal  Musculoskeletal: No deformities, no cyanosis or clubbing  Neuro: alert, non focal  Skin: Warm, no lesions or rashes EKG obtained shows PVCs and prolonged first-degree AV block no evidence of acute ST-T segment changes     Assessment & Plan:  I personally reviewed all images and lab data in the [facility] system as well as any outside material available during this office visit and agree with the  radiology impressions.   Hypertensive urgency Patient out of medications and currently in first-degree AV block with nonspecific T wave abnormalities prolonged QTC and left atrial enlargement left ventricular hypertrophy as well as premature ventricular contractions  Blood pressure out of control on arrival 193/112 patient without any specific endorgan dysfunction other than the dyspnea  After 2 doses of 0.2 mg clonidine blood pressure down to 161/109  I will resume amlodipine 10 mg daily, increase Coreg to 12.5 mg twice daily, resume losartan 100 mg daily, increase furosemide to 40 mg daily, continue potassium supplementation  Refills on all these medications sent to our pharmacy at this date  Plan to refer to cardiology  Would also like for visiting nurse to check on the patient for a one-time check on blood pressure management and follow-up and medication adherence  PVC (premature ventricular contraction) As per hypertension assessment  First degree AV  block As per hypertension assessment  Controlled type 2 diabetes mellitus without complication, without long-term current use of insulin (HCC) Hemoglobin A1c actually at goal however will switch to Trulicity once weekly 6.46 mg and Metformin 500 mg twice daily due to side effects from the higher dose of Metformin  We will have the patient seen by her clinical pharmacist in a week and he will return to see me in 3 weeks  Chronic, continuous use of opioids Patient on chronic opioid use I checked the drug database no other prescribers will refill the tramadol that he has had previously  Depression Patient scores high on PHQ-9 and I have referred this patient to our clinical social worker for follow-up  Class 3 severe obesity due to excess calories with serious comorbidity and body mass index (BMI) of 45.0 to 49.9 in adult [facility]) Severe obesity I have given the patient dietary plan to follow   Camdyn was seen today for shortness of breath.  Diagnoses and all orders for this visit:  Hypertensive urgency -     Ambulatory referral to [facility] -     Ambulatory referral to Cardiology  Essential hypertension -     Discontinue: cloNIDine (CATAPRES) tablet 0.2 mg -     cloNIDine (CATAPRES) tablet 0.2 mg -     CBC with Differential/Platelet -     Ambulatory referral to [place] -     furosemide (LASIX) 40 MG tablet; Take 1 tablet (40 mg total) by mouth daily. -     potassium chloride (KLOR-CON) 8 MEQ tablet; Take 2 tablets (16 mEq total) by mouth 2 (two) times daily. -     losartan (COZAAR) 100 MG tablet; Take 1 tablet (100 mg total) by mouth daily. -     amLODipine (NORVASC) 10 MG tablet; Take 1 tablet (10 mg total) by mouth daily. -     carvedilol (COREG) 12.5 MG tablet; Take 1 tablet (12.5 mg total) by mouth 2 (two) times daily with a meal. Take 1 tablet p.o. twice a day -     cloNIDine (CATAPRES) tablet 0.2 mg  Controlled type 2 diabetes mellitus with microalbuminuria, without  long-term current use  of insulin (HCC) -     POCT glucose (manual entry) -     POCT glycosylated hemoglobin (Hb A1C) -     atorvastatin (LIPITOR) 10 MG tablet; Take 1 tablet (10 mg total) by mouth daily. -     metFORMIN (GLUCOPHAGE) 500 MG tablet; Take 1 tablet (500 mg total) by mouth 2 (two) times daily with a meal.  Controlled type 2 diabetes mellitus without complication, without long-term current use of insulin (HCC) -     Comprehensive metabolic panel -     Lipid panel -     CBC with Differential/Platelet -     Discontinue: Accu-Chek FastClix Lancets MISC; Use as directed to test blood sugar once daily. DX: E11.9 -     Discontinue: glucose blood (ACCU-CHEK GUIDE) test strip; Use as directed to test blood sugar once daily. DX: E11.9 -     glucose blood (ACCU-CHEK GUIDE) test strip; Use as directed to test blood sugar once daily. DX: E11.9 -     Discontinue: Accu-Chek FastClix Lancets MISC; Use as directed to test blood sugar once daily. DX: E11.9  Primary osteoarthritis of right knee -     traMADol (ULTRAM) 50 MG tablet; Take 1 tablet (50 mg total) by mouth every 12 (twelve) hours as needed.  First degree AV block -     Ambulatory referral to Cardiology  PVC (premature ventricular contraction) -     Ambulatory referral to Cardiology  Chronic, continuous use of opioids  Moderate episode of recurrent major depressive disorder (HCC)  Class 3 severe obesity due to excess calories with serious comorbidity and body mass index (BMI) of 45.0 to 49.9 in adult [facility])  Other orders -     Dulaglutide (TRULICITY) 7.35 DI/9.7OE SOPN; Inject 0.75 mg into the skin once a week. -     diclofenac Sodium (VOLTAREN) 1 % GEL; Apply 2 g topically 4 (four) times daily. To knees -     Blood Pressure Monitoring (BLOOD PRESSURE MONITOR 7) DEVI; Use to monitor blood pressure.   Patient to obtain a blood pressure meter and record values also on the refer this patient to home health this for visiting nurse  to check on the patient

## 2020-03-04 NOTE — Assessment & Plan Note (Signed)
Severe obesity I have given the patient dietary plan to follow

## 2020-03-04 NOTE — Progress Notes (Signed)
SOB FOR 2WKS PT OUT OF ALL MEDS

## 2020-03-04 NOTE — Telephone Encounter (Signed)
Referral received for home health nursing. Attempted to contact the patient # [phone] to inquire if he has a preferred home health agency and that we need to find an agency that is in network with his insurance. Also need to confirm him contact number.  There is no guarantee that an agency will be able to accept the referral.  The message on patient's phone states # [phone] is not available.  HIPAA compliant message left requesting a call back to this CM.  This is not the number that was dialed.  Attempted twice to dial patient's phone number and this was the same message both times,

## 2020-03-04 NOTE — Assessment & Plan Note (Signed)
Hemoglobin A1c actually at goal however will switch to Trulicity once weekly 0.75 mg and Metformin 500 mg twice daily due to side effects from the higher dose of Metformin  We will have the patient seen by her clinical pharmacist in a week and he will return to see me in 3 weeks

## 2020-03-04 NOTE — Assessment & Plan Note (Addendum)
Patient out of medications and currently in first-degree AV block with nonspecific T wave abnormalities prolonged QTC and left atrial enlargement left ventricular hypertrophy as well as premature ventricular contractions  Blood pressure out of control on arrival 193/112 patient without any specific endorgan dysfunction other than the dyspnea  After 2 doses of 0.2 mg clonidine blood pressure down to 161/109  I will resume amlodipine 10 mg daily, increase Coreg to 12.5 mg twice daily, resume losartan 100 mg daily, increase furosemide to 40 mg daily, continue potassium supplementation  Refills on all these medications sent to our pharmacy at this date  Plan to refer to cardiology  Would also like for visiting nurse to check on the patient for a one-time check on blood pressure management and follow-up and medication adherence

## 2020-03-04 NOTE — Assessment & Plan Note (Signed)
Patient scores high on PHQ-9 and I have referred this patient to our clinical social worker for follow-up

## 2020-03-05 ENCOUNTER — Emergency Department (HOSPITAL_COMMUNITY): Payer: No Typology Code available for payment source

## 2020-03-05 ENCOUNTER — Emergency Department (HOSPITAL_COMMUNITY)
Admission: EM | Admit: 2020-03-05 | Discharge: 2020-03-05 | Disposition: A | Discharge status: Home / Self Care | Payer: No Typology Code available for payment source | Attending: Emergency Medicine | Admitting: Emergency Medicine

## 2020-03-05 ENCOUNTER — Other Ambulatory Visit: Payer: Self-pay

## 2020-03-05 DIAGNOSIS — Z20822 Contact with and (suspected) exposure to covid-19: Secondary | ICD-10-CM | POA: Insufficient documentation

## 2020-03-05 DIAGNOSIS — Z5321 Procedure and treatment not carried out due to patient leaving prior to being seen by health care provider: Secondary | ICD-10-CM | POA: Diagnosis not present

## 2020-03-05 DIAGNOSIS — I110 Hypertensive heart disease with heart failure: Secondary | ICD-10-CM | POA: Insufficient documentation

## 2020-03-05 DIAGNOSIS — N179 Acute kidney failure, unspecified: Secondary | ICD-10-CM | POA: Insufficient documentation

## 2020-03-05 DIAGNOSIS — R1900 Intra-abdominal and pelvic swelling, mass and lump, unspecified site: Secondary | ICD-10-CM | POA: Insufficient documentation

## 2020-03-05 DIAGNOSIS — I5021 Acute systolic (congestive) heart failure: Secondary | ICD-10-CM | POA: Insufficient documentation

## 2020-03-05 DIAGNOSIS — Z7984 Long term (current) use of oral hypoglycemic drugs: Secondary | ICD-10-CM | POA: Insufficient documentation

## 2020-03-05 DIAGNOSIS — Z79899 Other long term (current) drug therapy: Secondary | ICD-10-CM | POA: Insufficient documentation

## 2020-03-05 DIAGNOSIS — E119 Type 2 diabetes mellitus without complications: Secondary | ICD-10-CM | POA: Insufficient documentation

## 2020-03-05 DIAGNOSIS — R0602 Shortness of breath: Secondary | ICD-10-CM | POA: Diagnosis not present

## 2020-03-05 DIAGNOSIS — Z91040 Latex allergy status: Secondary | ICD-10-CM | POA: Insufficient documentation

## 2020-03-05 DIAGNOSIS — R609 Edema, unspecified: Secondary | ICD-10-CM | POA: Diagnosis not present

## 2020-03-05 DIAGNOSIS — I10 Essential (primary) hypertension: Secondary | ICD-10-CM | POA: Diagnosis not present

## 2020-03-05 DIAGNOSIS — Z87891 Personal history of nicotine dependence: Secondary | ICD-10-CM | POA: Insufficient documentation

## 2020-03-05 LAB — CBC
HCT: 40 % (ref 39.0–52.0)
Hemoglobin: 13.3 g/dL (ref 13.0–17.0)
MCH: 27.4 pg (ref 26.0–34.0)
MCHC: 33.3 g/dL (ref 30.0–36.0)
MCV: 82.3 fL (ref 80.0–100.0)
Platelets: 139 10*3/uL — ABNORMAL LOW (ref 150–400)
RBC: 4.86 MIL/uL (ref 4.22–5.81)
RDW: 16.5 % — ABNORMAL HIGH (ref 11.5–15.5)
WBC: 5.8 10*3/uL (ref 4.0–10.5)
nRBC: 0 % (ref 0.0–0.2)

## 2020-03-05 LAB — COMPREHENSIVE METABOLIC PANEL
ALT: 23 IU/L (ref 0–44)
AST: 23 IU/L (ref 0–40)
Albumin/Globulin Ratio: 1.2 (ref 1.2–2.2)
Albumin: 4.1 g/dL (ref 3.8–4.8)
Alkaline Phosphatase: 53 IU/L (ref 44–121)
BUN/Creatinine Ratio: 8 — ABNORMAL LOW (ref 10–24)
BUN: 13 mg/dL (ref 8–27)
Bilirubin Total: 1.1 mg/dL (ref 0.0–1.2)
CO2: 23 mmol/L (ref 20–29)
Calcium: 10.4 mg/dL — ABNORMAL HIGH (ref 8.6–10.2)
Chloride: 103 mmol/L (ref 96–106)
Creatinine, Ser: 1.55 mg/dL — ABNORMAL HIGH (ref 0.76–1.27)
GFR calc Af Amer: 53 mL/min/{1.73_m2} — ABNORMAL LOW (ref >59)
GFR calc non Af Amer: 46 mL/min/{1.73_m2} — ABNORMAL LOW (ref >59)
Globulin, Total: 3.3 g/dL (ref 1.5–4.5)
Glucose: 128 mg/dL — ABNORMAL HIGH (ref 65–99)
Potassium: 3.6 mmol/L (ref 3.5–5.2)
Sodium: 140 mmol/L (ref 134–144)
Total Protein: 7.4 g/dL (ref 6.0–8.5)

## 2020-03-05 LAB — CBC WITH DIFFERENTIAL/PLATELET
Basophils Absolute: 0.1 10*3/uL (ref 0.0–0.2)
Basos: 1 %
EOS (ABSOLUTE): 0.2 10*3/uL (ref 0.0–0.4)
Eos: 2 %
Hematocrit: 39.7 % (ref 37.5–51.0)
Hemoglobin: 13 g/dL (ref 13.0–17.7)
Immature Grans (Abs): 0 10*3/uL (ref 0.0–0.1)
Immature Granulocytes: 0 %
Lymphocytes Absolute: 3.7 10*3/uL — ABNORMAL HIGH (ref 0.7–3.1)
Lymphs: 48 %
MCH: 26.1 pg — ABNORMAL LOW (ref 26.6–33.0)
MCHC: 32.7 g/dL (ref 31.5–35.7)
MCV: 80 fL (ref 79–97)
Monocytes Absolute: 0.7 10*3/uL (ref 0.1–0.9)
Monocytes: 9 %
Neutrophils Absolute: 3.1 10*3/uL (ref 1.4–7.0)
Neutrophils: 40 %
Platelets: 144 10*3/uL — ABNORMAL LOW (ref 150–450)
RBC: 4.98 x10E6/uL (ref 4.14–5.80)
RDW: 15 % (ref 11.6–15.4)
WBC: 7.7 10*3/uL (ref 3.4–10.8)

## 2020-03-05 LAB — BASIC METABOLIC PANEL
Anion gap: 11 (ref 5–15)
BUN: 17 mg/dL (ref 8–23)
CO2: 22 mmol/L (ref 22–32)
Calcium: 9.9 mg/dL (ref 8.9–10.3)
Chloride: 102 mmol/L (ref 98–111)
Creatinine, Ser: 1.97 mg/dL — ABNORMAL HIGH (ref 0.61–1.24)
GFR, Estimated: 37 mL/min — ABNORMAL LOW (ref >60)
Glucose, Bld: 139 mg/dL — ABNORMAL HIGH (ref 70–99)
Potassium: 3.2 mmol/L — ABNORMAL LOW (ref 3.5–5.1)
Sodium: 135 mmol/L (ref 135–145)

## 2020-03-05 LAB — LIPID PANEL
Chol/HDL Ratio: 4.2 ratio (ref 0.0–5.0)
Cholesterol, Total: 143 mg/dL (ref 100–199)
HDL: 34 mg/dL — ABNORMAL LOW (ref >39)
LDL Chol Calc (NIH): 90 mg/dL (ref 0–99)
Triglycerides: 100 mg/dL (ref 0–149)
VLDL Cholesterol Cal: 19 mg/dL (ref 5–40)

## 2020-03-05 LAB — BRAIN NATRIURETIC PEPTIDE: B Natriuretic Peptide: 1127.4 pg/mL — ABNORMAL HIGH (ref 0.0–100.0)

## 2020-03-05 NOTE — ED Triage Notes (Signed)
Pt reports intermittent shortness of breath x 1 week and abdominal swelling since yesterday. Reports he takes a fluid pill and is compliant with same. Seen at [facility] yesterday for same with negative covid test.

## 2020-03-06 ENCOUNTER — Encounter (HOSPITAL_COMMUNITY): Payer: Self-pay

## 2020-03-06 ENCOUNTER — Emergency Department (HOSPITAL_COMMUNITY)
Admission: EM | Admit: 2020-03-06 | Discharge: 2020-03-06 | Disposition: A | Discharge status: Home / Self Care | Payer: No Typology Code available for payment source | Source: Home / Self Care | Attending: Emergency Medicine | Admitting: Emergency Medicine

## 2020-03-06 ENCOUNTER — Other Ambulatory Visit: Payer: Self-pay

## 2020-03-06 ENCOUNTER — Emergency Department (HOSPITAL_COMMUNITY): Payer: No Typology Code available for payment source

## 2020-03-06 DIAGNOSIS — I5021 Acute systolic (congestive) heart failure: Secondary | ICD-10-CM

## 2020-03-06 DIAGNOSIS — J810 Acute pulmonary edema: Secondary | ICD-10-CM

## 2020-03-06 DIAGNOSIS — N179 Acute kidney failure, unspecified: Secondary | ICD-10-CM

## 2020-03-06 LAB — RESP PANEL BY RT-PCR (FLU A&B, COVID) ARPGX2
Influenza A by PCR: NEGATIVE
Influenza B by PCR: NEGATIVE
SARS Coronavirus 2 by RT PCR: NEGATIVE

## 2020-03-06 MED ORDER — FUROSEMIDE 10 MG/ML IJ SOLN
40.0000 mg | Freq: Once | INTRAMUSCULAR | Status: AC
  Administered 2020-03-06: 40 mg via INTRAVENOUS
  Filled 2020-03-06: qty 4

## 2020-03-06 MED ORDER — NITROGLYCERIN 2 % TD OINT
1.0000 [in_us] | TOPICAL_OINTMENT | Freq: Once | TRANSDERMAL | Status: AC
  Administered 2020-03-06: 1 [in_us] via TOPICAL
  Filled 2020-03-06: qty 1

## 2020-03-06 MED ORDER — NITROGLYCERIN 2 % TD OINT: 1.0000 [in_us] | TOPICAL_OINTMENT | Freq: Once | TRANSDERMAL | Status: DC

## 2020-03-06 NOTE — ED Provider Notes (Signed)
Austin Mercer   CSN: [id number] Arrival date & time: 03/05/20  2355     History Chief Complaint  Patient presents with  . Shortness of Breath    Austin Mercer. is a 69 y.o. male.  The history is provided by the patient.  Shortness of Breath Severity:  Moderate Onset quality:  Gradual Duration:  1 week Timing:  Intermittent Progression:  Worsening Chronicity:  New Relieved by:  Nothing Worsened by:  Nothing Associated symptoms: no chest pain and no fever   Patient with history of hypertension obesity presents with shortness of breath.  He reports for the past week he had increasing shortness of breath He reports fluid buildup in his abdomen.  He has not been compliant with all his medications.     Past Medical History:  Diagnosis Date  . Hypertension     Patient Active Problem List   Diagnosis Date Noted  . Hypertensive urgency 03/04/2020  . First degree AV block 03/04/2020  . PVC (premature ventricular contraction) 03/04/2020  . Depression 03/04/2020  . History of alcohol use disorder 04/26/2018  . 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 Positive for macroalbuminuria 01/27/2018  . Hypercalcemia 01/24/2018  . Controlled type 2 diabetes mellitus without complication, without long-term current use of insulin ([place]) 11/23/2017  . Essential hypertension 11/23/2017  . Primary osteoarthritis of right knee 11/23/2017  . Class 3 severe obesity due to excess calories with serious comorbidity and body mass index (BMI) of 45.0 to 49.9 in adult ([place]) 11/23/2017  . Age-related cataract of right eye 11/23/2017  . Hepatitis C virus infection cured after antiviral drug therapy 11/23/2017  . Chronic, continuous use of opioids 11/23/2017    Past Surgical History:  Procedure Laterality Date  . ANKLE FRACTURE SURGERY         Family History  Problem Relation Age of Onset  . Diabetes Father     Social History    Tobacco Use  . Smoking status: Former Smoker    Quit date: 1973    Years since quitting: 48.9  . Smokeless tobacco: Never Used  Vaping Use  . Vaping Use: Never used  Substance Use Topics  . Alcohol use: Yes    Comment: Beer, Red wine  . Drug use: Not Currently    Home Medications Prior to Admission medications   Medication Sig Start Date End Date Taking? Authorizing Provider  Accu-Chek Softclix Lancets lancets Use as directed to test blood sugar once daily. DX: E11.9 03/04/20  Yes Ladell Pier, MD  Blood Glucose Monitoring Suppl (ACCU-CHEK GUIDE) w/Device KIT 1 each by Does not apply route daily. Use as directed to test blood sugar once daily. DX: E11.9 07/25/18  Yes Henderly, Britni A, PA-C  Blood Pressure Monitoring (BLOOD PRESSURE MONITOR 7) DEVI Use to monitor blood pressure. 03/04/20  Yes [name], MD  diclofenac Sodium (VOLTAREN) 1 % GEL Apply 2 g topically 4 (four) times daily. To knees 03/04/20  Yes [name], MD  furosemide (LASIX) 40 MG tablet Take 1 tablet (40 mg total) by mouth daily. 03/04/20  Yes [name], MD  glucose blood (ACCU-CHEK GUIDE) test strip Use as directed to test blood sugar once daily. DX: E11.9 03/04/20  Yes [name], MD  losartan (COZAAR) 100 MG tablet Take 1 tablet (100 mg total) by mouth daily. 03/04/20  Yes [name], MD  metFORMIN (GLUCOPHAGE) 500 MG tablet Take 1 tablet (500 mg  total) by mouth 2 (two) times daily with a meal. 03/04/20  Yes [name], MD  potassium chloride (KLOR-CON) 8 MEQ tablet Take 2 tablets (16 mEq total) by mouth 2 (two) times daily. 03/04/20  Yes [name], MD  tadalafil (ADCIRCA/CIALIS) 20 MG tablet 1 tab PO Q 1 hr prior to sex as needed.  Limit use to 1 tab/24 hr 04/26/18  Yes Ladell Pier, MD  traMADol (ULTRAM) 50 MG tablet Take 1 tablet (50 mg total) by mouth every 12 (twelve) hours as needed. 03/04/20  Yes [name], MD  amLODipine (NORVASC) 10 MG tablet  Take 1 tablet (10 mg total) by mouth daily. 03/04/20   [name], MD  atorvastatin (LIPITOR) 10 MG tablet Take 1 tablet (10 mg total) by mouth daily. 03/04/20   [name], MD  carvedilol (COREG) 12.5 MG tablet Take 1 tablet (12.5 mg total) by mouth 2 (two) times daily with a meal. Take 1 tablet p.o. twice a day 03/04/20   [name], MD  Dulaglutide (TRULICITY) 0.35 DH/7.4BU SOPN Inject 0.75 mg into the skin once a week. 03/04/20   [name], MD    Allergies    Latex  Review of Systems   Review of Systems  Constitutional: Negative for fever.  Respiratory: Positive for shortness of breath.   Cardiovascular: Negative for chest pain.  All other systems reviewed and are negative.   Physical Exam Updated Vital Signs BP (!) 174/108   Pulse 80   Temp 98 F (36.7 C)   Resp 17   SpO2 99%   Physical Exam CONSTITUTIONAL: Chronically HEAD: Normocephalic/atraumatic EYES: EOMI/PERRL ENMT: Mucous membranes moist NECK: supple no meningeal signs SPINE/BACK:entire spine nontender CV: S1/S2 noted, no murmurs/rubs/gallops noted LUNGS: Mild tachypnea, decreased breath sounds bilaterally ABDOMEN: soft, nontender, no rebound or guarding, bowel sounds noted throughout abdomen, obese GU:no cva tenderness NEURO: Pt is awake/alert/appropriate, moves all extremitiesx4.  No facial droop.   EXTREMITIES: pulses normal/equal, full ROM, symmetric edema to the lower extremities SKIN: warm, color normal PSYCH: no abnormalities of mood noted, alert and oriented to situation  ED Results / Procedures / Treatments   Labs (all labs ordered are listed, but only abnormal results are displayed) Labs Reviewed  RESP PANEL BY RT-PCR (FLU A&B, COVID) [id number]    EKG EKG Interpretation  Date/Time:  Saturday March 06 2020 00:09:01 EST Ventricular Rate:  86 PR Interval:    QRS Duration: 108 QT Interval:  389 QTC Calculation: 466 R Axis:   18 Text Interpretation: Sinus rhythm  Paired ventricular premature complexes Borderline prolonged PR interval Probable left atrial enlargement Incomplete left bundle branch block Left ventricular hypertrophy Confirmed by Ripley Fraise [phone]) on 03/06/2020 12:20:01 AM   Radiology DG Chest 2 View  Result Date: 03/05/2020 CLINICAL DATA:  Short of breath, hypertension, diabetes EXAM: CHEST - 2 VIEW COMPARISON:  01/25/2018 FINDINGS: Frontal and lateral views of the chest demonstrate an enlarged cardiac silhouette. No airspace disease, effusion, or pneumothorax. No acute bony abnormalities. IMPRESSION: 1. Enlarged cardiac silhouette. 2. No acute airspace disease. Electronically Signed   By: Randa Ngo M.D.   On: 03/05/2020 15:15   DG Chest Port 1 View  Result Date: 03/06/2020 CLINICAL DATA:  Shortness of breath EXAM: PORTABLE CHEST 1 VIEW COMPARISON:  March 05, 2020 FINDINGS: The heart size and mediastinal contours are mildly enlarged. There is mild prominence of the central pulmonary vasculature. No large airspace consolidation or pleural effusion. The visualized  skeletal structures are unremarkable. IMPRESSION: Mild pulmonary vascular congestion. Electronically Signed   By: Prudencio Pair M.D.   On: 03/06/2020 03:45    Procedures Procedures  Medications Ordered in ED Medications  furosemide (LASIX) injection 40 mg (40 mg Intravenous Given 03/06/20 0505)  nitroGLYCERIN (NITROGLYN) 2 % ointment 1 inch (1 inch Topical Given 03/06/20 0507)    ED Course  I have reviewed the triage vital signs and the nursing notes.  Pertinent labs & imaging results that were available during my care of the patient were reviewed by me and considered in my medical decision making (see chart for details).    MDM Rules/Calculators/A&P                          4:12 AM Patient with a history of hypertension obesity presents with shortness of breath.  Patient is a very poor historian, but reports increasing shortness of breath in the past week.  He  thinks it is all in his abdomen.  Denies any chest pain. Recently seen by his PCP and had his prescriptions refilled  Patient went to the [facility], ER yesterday but left due to wait times. X-ray at that time was negative, but I repeated the chest x-ray here which revealed vascular congestion.  Clinically patient appears to be in CHF and has a BNP over thousand.  Labs from that ER visit were reviewed.  We will start nitroglycerin and lasix 5:23 AM Patient is hesitant to be admitted.  He would prefer to have ER treatment to see if he can be improved. Lasix and nitroglycerin has been given to patient 7:16 AM Patient has significant urine output. He ambulated without any hypoxia.  He reports his work of breathing is improved. He is hypertensive, though I suspect some of these readings are erroneous because he keeps moving around in the room Discussed at length need to follow-up with his PCP and take his medications. He claims to have a follow-up appointment next week. Patient is still insistent on being discharged home.  Final Clinical Impression(s) / ED Diagnoses Final diagnoses:  Acute systolic congestive heart failure ([place])  Acute pulmonary edema (HCC)  AKI (acute kidney injury) [facility])    Rx / DC Orders ED Discharge Orders    None       Ripley Fraise, MD 03/06/20 [phone]

## 2020-03-06 NOTE — ED Notes (Signed)
Patients pulse oximetry remained above 95% while ambulating. Patient says "I feel much more clear when I breathe."

## 2020-03-06 NOTE — ED Triage Notes (Signed)
Pt BIB EMS from home. Pt reports intermittent SHOB x1 week due to fluid build up in abdomen. Pt reports taking his fluid pill this morning, but not BP medication. Pt seen at [facility] for same, but left due to wait time.

## 2020-03-06 NOTE — ED Notes (Signed)
Patient given sandwich, drink and snacks following provider approval.

## 2020-03-08 ENCOUNTER — Telehealth: Payer: Self-pay

## 2020-03-08 ENCOUNTER — Ambulatory Visit: Payer: No Typology Code available for payment source | Admitting: Pharmacist

## 2020-03-08 NOTE — Telephone Encounter (Signed)
-----   Message from Marcine Matar, MD sent at 03/06/2020  7:47 AM EST ----- Regarding: Needs hosp f/u visit next week

## 2020-03-08 NOTE — Telephone Encounter (Signed)
Message from Marcine Matar, MD sent at 03/06/2020  7:47 AM EST ----- Regarding: Needs hosp f/u visit next week

## 2020-03-08 NOTE — Telephone Encounter (Signed)
Regarding home health referral:  Attempted to contact the patient # [phone] to inquire if he has a preferred home health agency and inform him that we need to find an agency that is in network with his insurance. Also need to confirm him contact number.  There is no guarantee that an agency will be able to accept the referral.  The message on patient's phone states # [phone] is not available. This is not the phone number that was dialed . Voicemail full, unable to leave a message.   He has an appt at [facility] 03/10/2020

## 2020-03-08 NOTE — Telephone Encounter (Signed)
I am not sure of schedule for next week, please send to front desk so they can schedule

## 2020-03-10 ENCOUNTER — Telehealth: Payer: Self-pay

## 2020-03-10 ENCOUNTER — Encounter: Payer: Self-pay | Admitting: Physician Assistant

## 2020-03-10 ENCOUNTER — Ambulatory Visit: Payer: No Typology Code available for payment source | Attending: Physician Assistant | Admitting: Physician Assistant

## 2020-03-10 ENCOUNTER — Other Ambulatory Visit: Payer: Self-pay

## 2020-03-10 VITALS — BP 139/96 | HR 88 | Temp 98.2°F | Resp 20 | Ht 74.0 in | Wt 327.0 lb

## 2020-03-10 DIAGNOSIS — Z6841 Body Mass Index (BMI) 40.0 and over, adult: Secondary | ICD-10-CM

## 2020-03-10 DIAGNOSIS — I5021 Acute systolic (congestive) heart failure: Secondary | ICD-10-CM

## 2020-03-10 NOTE — Progress Notes (Signed)
Patient has eaten today and patient has taken medication today. Patient denies pain at this time. Patient completing follow up from stay at the [facility].

## 2020-03-10 NOTE — Patient Instructions (Signed)
I am starting a referral for you to be seen by cardiology, you should hear from them soon.  I encourage you to weigh yourself on a daily basis, if weights fluctuate more than 3 pounds please give the office a call.  We will call you with your lab results  Roney Jaffe, PA-C Physician Assistant [facility] [email or web address]   Heart Failure Action Plan A heart failure action plan helps you understand what to do when you have symptoms of heart failure. Follow the plan that was created by you and your health care provider. Review your plan each time you visit your health care provider. Red zone These signs and symptoms mean you should get medical help right away:  You have trouble breathing when resting.  You have a dry cough that is getting worse.  You have swelling or pain in your legs or abdomen that is getting worse.  You suddenly gain more than 2-3 lb (0.9-1.4 kg) in a day, or more than 5 lb (2.3 kg) in one week. This amount may be more or less depending on your condition.  You have trouble staying awake or you feel confused.  You have chest pain.  You do not have an appetite.  You pass out. If you experience any of these symptoms:  Call your local emergency services (911 in the [place] right away or seek help at the emergency department of the nearest hospital. Yellow zone These signs and symptoms mean your condition may be getting worse and you should make some changes:  You have trouble breathing when you are active or you need to sleep with extra pillows.  You have swelling in your legs or abdomen.  You gain 2-3 lb (0.9-1.4 kg) in one day, or 5 lb (2.3 kg) in one week. This amount may be more or less depending on your condition.  You get tired easily.  You have trouble sleeping.  You have a dry cough. If you experience any of these symptoms:  Contact your health care provider within the next day.  Your  health care provider may adjust your medicines. Green zone These signs mean you are doing well and can continue what you are doing:  You do not have shortness of breath.  You have very little swelling or no new swelling.  Your weight is stable (no gain or loss).  You have a normal activity level.  You do not have chest pain or any other new symptoms. Follow these instructions at home:  Take over-the-counter and prescription medicines only as told by your health care provider.  Weigh yourself daily. Your target weight is __________ lb (__________ kg). ? Call your health care provider if you gain more than __________ lb (__________ kg) in a day, or more than __________ lb (__________ kg) in one week.  Eat a heart-healthy diet. Work with a diet and nutrition specialist (dietitian) to create an eating plan that is best for you.  Keep all follow-up visits as told by your health care provider. This is important. Where to find more information  American Heart Association: www.heart.org Summary  Follow the action plan that was created by you and your health care provider.  Get help right away if you have any symptoms in the Red zone. This information is not intended to replace advice given to you by your health care provider. Make sure you discuss any questions you have with your health care provider. Document Revised: 03/02/2017 Document Reviewed: 04/29/2016 Elsevier Patient  Education  2020 Elsevier Inc.  

## 2020-03-10 NOTE — Progress Notes (Signed)
Established Patient Office Visit  Subjective:  Patient ID: Austin Mercer., male    DOB: November 28, 1950  Age: 69 y.o. MRN: [id number]  CC:  Chief Complaint  Patient presents with  . Follow-up    HPI Austin Mercer. reports that he was seen at the emergency department at [facility] on March 06, 2020.  Hospital course  Patient with a history of hypertension obesity presents with shortness of breath.  Patient is a very poor historian, but reports increasing shortness of breath in the past week.  He thinks it is all in his abdomen.  Denies any chest pain. Recently seen by his PCP and had his prescriptions refilled  Patient went to the [facility], ER yesterday but left due to wait times. X-ray at that time was negative, but I repeated the chest x-ray here which revealed vascular congestion.  Clinically patient appears to be in CHF and has a BNP over thousand.  Labs from that ER visit were reviewed.  We will start nitroglycerin and lasix 5:23 AM Patient is hesitant to be admitted.  He would prefer to have ER treatment to see if he can be improved. Lasix and nitroglycerin has been given to patient 7:16 AM Patient has significant urine output. He ambulated without any hypoxia.  He reports his work of breathing is improved. He is hypertensive, though I suspect some of these readings are erroneous because he keeps moving around in the room Discussed at length need to follow-up with his PCP and take his medications. He claims to have a follow-up appointment next week. Patient is still insistent on being discharged home.   Reports that since he has been home, he is feeling a little better, but does endorse that  he is still haivng some difficulty breathing.  Patient is unsure if he is taking his Lasix on a daily basis, denies following a low-sodium diet he has not laying on a daily basis    Past Medical History:  Diagnosis Date  . Hypertension     Past Surgical History:  Procedure  Laterality Date  . ANKLE FRACTURE SURGERY      Family History  Problem Relation Age of Onset  . Diabetes Father     Social History   Socioeconomic History  . Marital status: Divorced    Spouse name: Not on file  . Number of children: 2  . Years of education: Not on file  . Highest education level: Not on file  Occupational History  . Occupation: retired  Tobacco Use  . Smoking status: Former Smoker    Quit date: 1973    Years since quitting: 48.9  . Smokeless tobacco: Never Used  Vaping Use  . Vaping Use: Never used  Substance and Sexual Activity  . Alcohol use: Yes    Comment: Beer, Red wine  . Drug use: Not Currently  . Sexual activity: Not on file  Other Topics Concern  . Not on file  Social History Narrative  . Not on file   Social Determinants of Health   Financial Resource Strain:   . Difficulty of Paying Living Expenses: Not on file  Food Insecurity:   . Worried About Charity fundraiser in the Last Year: Not on file  . Ran Out of Food in the Last Year: Not on file  Transportation Needs:   . Lack of Transportation (Medical): Not on file  . Lack of Transportation (Non-Medical): Not on file  Physical Activity:   . Days  of Exercise per Week: Not on file  . Minutes of Exercise per Session: Not on file  Stress:   . Feeling of Stress : Not on file  Social Connections:   . Frequency of Communication with Friends and Family: Not on file  . Frequency of Social Gatherings with Friends and Family: Not on file  . Attends Religious Services: Not on file  . Active Member of Clubs or Organizations: Not on file  . Attends Archivist Meetings: Not on file  . Marital Status: Not on file  Intimate Partner Violence:   . Fear of Current or Ex-Partner: Not on file  . Emotionally Abused: Not on file  . Physically Abused: Not on file  . Sexually Abused: Not on file    Outpatient Medications Prior to Visit  Medication Sig Dispense Refill  . Accu-Chek  Softclix Lancets lancets Use as directed to test blood sugar once daily. DX: E11.9 100 each 2  . amLODipine (NORVASC) 10 MG tablet Take 1 tablet (10 mg total) by mouth daily. 90 tablet 1  . atorvastatin (LIPITOR) 10 MG tablet Take 1 tablet (10 mg total) by mouth daily. 90 tablet 1  . Blood Glucose Monitoring Suppl (ACCU-CHEK GUIDE) w/Device KIT 1 each by Does not apply route daily. Use as directed to test blood sugar once daily. DX: E11.9 1 kit 0  . Blood Pressure Monitoring (BLOOD PRESSURE MONITOR 7) DEVI Use to monitor blood pressure. 1 each 0  . carvedilol (COREG) 12.5 MG tablet Take 1 tablet (12.5 mg total) by mouth 2 (two) times daily with a meal. Take 1 tablet p.o. twice a day 60 tablet 4  . diclofenac Sodium (VOLTAREN) 1 % GEL Apply 2 g topically 4 (four) times daily. To knees 50 g 4  . Dulaglutide (TRULICITY) 6.81 EX/5.1ZG SOPN Inject 0.75 mg into the skin once a week. 2 mL 4  . furosemide (LASIX) 40 MG tablet Take 1 tablet (40 mg total) by mouth daily. 90 tablet 2  . glucose blood (ACCU-CHEK GUIDE) test strip Use as directed to test blood sugar once daily. DX: E11.9 100 each 12  . losartan (COZAAR) 100 MG tablet Take 1 tablet (100 mg total) by mouth daily. 90 tablet 2  . metFORMIN (GLUCOPHAGE) 500 MG tablet Take 1 tablet (500 mg total) by mouth 2 (two) times daily with a meal. 180 tablet 1  . potassium chloride (KLOR-CON) 8 MEQ tablet Take 2 tablets (16 mEq total) by mouth 2 (two) times daily. 120 tablet 6  . tadalafil (ADCIRCA/CIALIS) 20 MG tablet 1 tab PO Q 1 hr prior to sex as needed.  Limit use to 1 tab/24 hr 20 tablet 1  . traMADol (ULTRAM) 50 MG tablet Take 1 tablet (50 mg total) by mouth every 12 (twelve) hours as needed. 60 tablet 0   No facility-administered medications prior to visit.    Allergies  Allergen Reactions  . Latex Itching    ROS Review of Systems  Constitutional: Positive for fatigue.  HENT: Negative.   Eyes: Negative.   Respiratory: Positive for shortness  of breath. Negative for wheezing.   Cardiovascular: Positive for leg swelling. Negative for chest pain and palpitations.  Gastrointestinal: Negative.   Endocrine: Negative.   Genitourinary: Negative.   Musculoskeletal: Negative.   Skin: Negative.   Allergic/Immunologic: Negative.   Neurological: Positive for dizziness. Negative for weakness.  Hematological: Negative.   Psychiatric/Behavioral: Negative.       Objective:    Physical Exam Vitals and  nursing note reviewed.  Constitutional:      General: He is not in acute distress.    Appearance: Normal appearance. He is obese. He is not ill-appearing.  HENT:     Head: Normocephalic and atraumatic.     Right Ear: External ear normal.     Left Ear: External ear normal.     Nose: Nose normal.     Mouth/Throat:     Mouth: Mucous membranes are moist.     Pharynx: Oropharynx is clear.  Eyes:     Extraocular Movements: Extraocular movements intact.     Conjunctiva/sclera: Conjunctivae normal.     Pupils: Pupils are equal, round, and reactive to light.  Cardiovascular:     Rate and Rhythm: Normal rate and regular rhythm.     Pulses: Normal pulses.     Heart sounds: Normal heart sounds.  Pulmonary:     Effort: Pulmonary effort is normal. No respiratory distress.     Breath sounds: Normal breath sounds. No wheezing.  Abdominal:     General: Abdomen is flat.     Palpations: Abdomen is soft.  Musculoskeletal:        General: Normal range of motion.     Cervical back: Normal range of motion.     Right lower leg: 1+ Pitting Edema present.     Left lower leg: 1+ Pitting Edema present.  Skin:    General: Skin is warm and dry.  Neurological:     General: No focal deficit present.     Mental Status: He is alert and oriented to person, place, and time.  Psychiatric:        Mood and Affect: Mood normal.        Behavior: Behavior normal.        Thought Content: Thought content normal.        Judgment: Judgment normal.     BP (!)  160/100 (BP Location: Right Arm, Patient Position: Sitting, Cuff Size: Large)   Pulse 88   Temp 98.2 F (36.8 C) (Oral)   Resp 20   Ht 6' 2"  (1.88 m)   Wt (!) 327 lb (148.3 kg)   SpO2 99%   BMI 41.98 kg/m  Wt Readings from Last 3 Encounters:  03/10/20 (!) 327 lb (148.3 kg)  03/06/20 (!) 343 lb (155.6 kg)  03/05/20 (!) 332 lb (150.6 kg)     Health Maintenance Due  Topic Date Due  . OPHTHALMOLOGY EXAM  Never done  . COVID-19 Vaccine (1) Never done  . COLONOSCOPY  Never done  . FOOT EXAM  04/27/2019  . PNA vac Low Risk Adult (2 of 2 - PPSV23) 04/27/2019  . TETANUS/TDAP  06/03/2019    There are no preventive care reminders to display for this patient.  Lab Results  Component Value Date   TSH 3.060 11/27/2017   Lab Results  Component Value Date   WBC 5.8 03/05/2020   HGB 13.3 03/05/2020   HCT 40.0 03/05/2020   MCV 82.3 03/05/2020   PLT 139 (L) 03/05/2020   Lab Results  Component Value Date   NA 135 03/05/2020   K 3.2 (L) 03/05/2020   CO2 22 03/05/2020   GLUCOSE 139 (H) 03/05/2020   BUN 17 03/05/2020   CREATININE 1.97 (H) 03/05/2020   BILITOT 1.1 03/04/2020   ALKPHOS 53 03/04/2020   AST 23 03/04/2020   ALT 23 03/04/2020   PROT 7.4 03/04/2020   ALBUMIN 4.1 03/04/2020   CALCIUM 9.9 03/05/2020  ANIONGAP 11 03/05/2020   Lab Results  Component Value Date   CHOL 143 03/04/2020   Lab Results  Component Value Date   HDL 34 (L) 03/04/2020   Lab Results  Component Value Date   LDLCALC 90 03/04/2020   Lab Results  Component Value Date   TRIG 100 03/04/2020   Lab Results  Component Value Date   CHOLHDL 4.2 03/04/2020   Lab Results  Component Value Date   HGBA1C 6.6 (A) 03/04/2020      Assessment & Plan:   Problem List Items Addressed This Visit    None     1. Acute systolic heart failure [facility]) Patient encouraged to make sure he is taking his Lasix on a daily basis, patient education given on daily weigh in, low-sodium diet.  Clinical social  worker met with patient during this office visit to help get him established with home health care.  Clinical pharmacist also met with patient during this office visit to help educate on administering Trulicity.  Red flag warnings for prompt reevaluation given, patient understands and agrees - Brain natriuretic peptide - Ambulatory referral to Cardiology   I have reviewed the patient's medical history (PMH, PSH, Social History, Family History, Medications, and allergies) , and have been updated if relevant. I spent 30 minutes reviewing chart and  face to face time with patient.     No orders of the defined types were placed in this encounter.   Follow-up: No follow-ups on file.    Loraine Grip Mayers, PA-C

## 2020-03-10 NOTE — Telephone Encounter (Signed)
Met with the patient when he was in the clinic today.  He provided his new phone number.   This CM explained to him that a referral was received for home health nursing. He did not have a preferred agency. Explained to him that we need to identify an agency that is in network with his insurance. And he said he understood.  Referral faxed to [place] for review.

## 2020-03-11 DIAGNOSIS — I5021 Acute systolic (congestive) heart failure: Secondary | ICD-10-CM | POA: Insufficient documentation

## 2020-03-11 DIAGNOSIS — I5022 Chronic systolic (congestive) heart failure: Secondary | ICD-10-CM | POA: Insufficient documentation

## 2020-03-11 LAB — BRAIN NATRIURETIC PEPTIDE: BNP: 783.4 pg/mL — ABNORMAL HIGH (ref 0.0–100.0)

## 2020-03-11 NOTE — Telephone Encounter (Signed)
Message received from [facility] health # [phone] regarding patient's VA insurance and his SW at the [place]..   Call returned to [facility].  Informed her that this clinic does not do billing and does not have information about his VA SW. She can contact the billing office at [facility] to inquire if they have more information about the patient's policy. Loren also noted that she will contact the patient.  She said she will work on obtaining more information about the policy. Authorization from the [place] is needed prior to initiating services,  She will also inquire within her office  if [facility] can be billed first.   This process of obtaining authorization may delay the start of care

## 2020-03-11 NOTE — Telephone Encounter (Signed)
Call received from [facility].  She explained that she spoke to the patient and also contacted the [place].  He has not established care at the [facility] and the billing would need to go though [facility].   She recommended contacting [facility] who is in network with [employer], [facility] is not in network.  [facility] placed to [facility] # [phone] , spoke to [place]. She requested that the referral be faxed to # [phone] for review and the referral was faxed as requested.

## 2020-03-12 ENCOUNTER — Telehealth: Payer: Self-pay | Admitting: *Deleted

## 2020-03-12 NOTE — Telephone Encounter (Signed)
-----   Message from Roney Jaffe, [place] sent at 03/11/2020  4:55 PM EST ----- Please call patient and let him know that his marker for heart failure has improved slightly, but is still very elevated.  Because he continues to have shortness of breath, he would be better treated in the hospital as discussed when he went to the emergency room for this same issue.  It is important that he continues his Lasix, low-sodium diet, daily weights all as discussed during office visit.  If his shortness of breath is worsening, he needs to present to the emergency room right away.  Cardiology referral has been placed and he should be receiving a call from them soon

## 2020-03-12 NOTE — Telephone Encounter (Signed)
Medical Assistant left message on patient's home and cell voicemail. Voicemail states to give a call back to [place] with [facility] at [phone]. Patient is aware via VM that his heart failure is improving yet still elevated. !!!Patient advised to monitor 3lbs of daily weight gain an persistent worsening SOB. Patient advised to report to ED for treatment and be mindful of cardiologist office calling to schedule his appointment!!!

## 2020-03-15 DIAGNOSIS — F331 Major depressive disorder, recurrent, moderate: Secondary | ICD-10-CM | POA: Diagnosis not present

## 2020-03-15 DIAGNOSIS — I440 Atrioventricular block, first degree: Secondary | ICD-10-CM | POA: Diagnosis not present

## 2020-03-15 DIAGNOSIS — M1711 Unilateral primary osteoarthritis, right knee: Secondary | ICD-10-CM | POA: Diagnosis not present

## 2020-03-15 DIAGNOSIS — Z87891 Personal history of nicotine dependence: Secondary | ICD-10-CM | POA: Diagnosis not present

## 2020-03-15 DIAGNOSIS — I10 Essential (primary) hypertension: Secondary | ICD-10-CM | POA: Diagnosis not present

## 2020-03-15 DIAGNOSIS — E669 Obesity, unspecified: Secondary | ICD-10-CM | POA: Diagnosis not present

## 2020-03-15 DIAGNOSIS — Z7984 Long term (current) use of oral hypoglycemic drugs: Secondary | ICD-10-CM | POA: Diagnosis not present

## 2020-03-15 DIAGNOSIS — E119 Type 2 diabetes mellitus without complications: Secondary | ICD-10-CM | POA: Diagnosis not present

## 2020-03-15 DIAGNOSIS — Z6841 Body Mass Index (BMI) 40.0 and over, adult: Secondary | ICD-10-CM | POA: Diagnosis not present

## 2020-03-16 ENCOUNTER — Telehealth: Payer: Self-pay | Admitting: Internal Medicine

## 2020-03-16 NOTE — Telephone Encounter (Signed)
Call placed to [facility], spoke to [place] who said that they did not receive the referral and noted that they were having problems with their fax machine last week.  She requested it be re-faxed to # [phone].  Referral then re-faxed as requested.

## 2020-03-16 NOTE — Telephone Encounter (Signed)
Home Health Verbal Orders - Caller/Agency: Meredith Mody at [facility] Number: [phone] Requesting OT/PT/Skilled Nursing/Social Work/Speech Therapy: Skilled nursing, BP check  Frequency:  1w9 with 1 prn

## 2020-03-17 ENCOUNTER — Telehealth: Payer: Self-pay

## 2020-03-17 NOTE — Telephone Encounter (Signed)
Call placed to [facility], spoke to [place] who said that they accepted the referral, start of care 03/15/2020.

## 2020-03-17 NOTE — Telephone Encounter (Signed)
Returned [place] from [facility] call and left a detailed vm giving verbal orders and if she has any questions or concerns to give a call

## 2020-03-23 ENCOUNTER — Encounter: Payer: Self-pay | Admitting: Pharmacist

## 2020-03-23 ENCOUNTER — Other Ambulatory Visit: Payer: Self-pay

## 2020-03-23 ENCOUNTER — Ambulatory Visit: Payer: No Typology Code available for payment source | Attending: Internal Medicine | Admitting: Pharmacist

## 2020-03-23 ENCOUNTER — Other Ambulatory Visit: Payer: Self-pay | Admitting: Internal Medicine

## 2020-03-23 DIAGNOSIS — Z7984 Long term (current) use of oral hypoglycemic drugs: Secondary | ICD-10-CM | POA: Diagnosis not present

## 2020-03-23 DIAGNOSIS — E669 Obesity, unspecified: Secondary | ICD-10-CM | POA: Diagnosis not present

## 2020-03-23 DIAGNOSIS — E119 Type 2 diabetes mellitus without complications: Secondary | ICD-10-CM

## 2020-03-23 DIAGNOSIS — Z6841 Body Mass Index (BMI) 40.0 and over, adult: Secondary | ICD-10-CM | POA: Diagnosis not present

## 2020-03-23 DIAGNOSIS — F331 Major depressive disorder, recurrent, moderate: Secondary | ICD-10-CM | POA: Diagnosis not present

## 2020-03-23 DIAGNOSIS — Z87891 Personal history of nicotine dependence: Secondary | ICD-10-CM | POA: Diagnosis not present

## 2020-03-23 DIAGNOSIS — I10 Essential (primary) hypertension: Secondary | ICD-10-CM | POA: Diagnosis not present

## 2020-03-23 DIAGNOSIS — M1711 Unilateral primary osteoarthritis, right knee: Secondary | ICD-10-CM | POA: Diagnosis not present

## 2020-03-23 DIAGNOSIS — I440 Atrioventricular block, first degree: Secondary | ICD-10-CM | POA: Diagnosis not present

## 2020-03-23 LAB — GLUCOSE, POCT (MANUAL RESULT ENTRY): POC Glucose: 106 mg/dl — AB (ref 70–99)

## 2020-03-23 MED ORDER — ACCU-CHEK GUIDE W/DEVICE KIT: 1.0000 | PACK | Freq: Every day | 0 refills | Status: DC

## 2020-03-23 MED FILL — ACCU-CHEK GUIDE ME W/DEVICE: W/DEVICE | 30 days supply | Qty: 1 | Fill #0

## 2020-03-23 NOTE — Progress Notes (Signed)
S:     No chief complaint on file.   Patient arrives well and in good spirits.  Presents for diabetes evaluation, education, and management. Patient was referred and last seen by Primary Care Provider, Dr. Delford Field, on 03/04/2020. At this visit, metformin was reduced to 500 mg twice daily due to side effect intolerance. Trulicity was initiated at 0.75 mg weekly dosing.  Today, patient endorses medication adherence with Trulicity. He is tolerating his Trulicity well. He admits to inconsistent self-dosing of metformin (some days takes 2 tablets BID, some days takes 1 tablet BID, some days misses doses). Accessibility to food appears to be a problem for him. Patient did not receive glucometer with his test strips and lancets, therefore, has not been able to check sugars at home.   Family/Social History: former smoker, family history of diabetes   Insurance coverage/medication affordability: VA coverage, [employer] secondary   Medication adherence reported, however, metformin dosing inconsistent. Current diabetes medications include: metformin 500 mg BID, Trulicity 0.75 mg weekly Current hypertension medications include: carvedilol 12.5mg  BID, losartan 100 mg daily, amlodipine 10 mg daily Current hyperlipidemia medications include: atorvastatin 10 mg daily   Patient denies hypoglycemic events.  Patient reported dietary habits: Eats 1-2 meals/day Diet is restricted due to limited access to food.    Patient-reported exercise habits: none reported   Patient denies nocturia (nighttime urination).  Patient denies neuropathy (nerve pain). Patient denies visual changes. Patient reports self foot exams.     O:  Physical Exam   ROS  Fasting blood sugar is office today: 106  Lab Results  Component Value Date   HGBA1C 6.6 (A) 03/04/2020   There were no vitals filed for this visit.  Lipid Panel     Component Value Date/Time   CHOL 143 03/04/2020 1231   TRIG 100 03/04/2020 1231   HDL  34 (L) 03/04/2020 1231   CHOLHDL 4.2 03/04/2020 1231   LDLCALC 90 03/04/2020 1231    Home fasting blood sugars: has not taken sugars at home yet, did not receive glucometer    Clinical Atherosclerotic Cardiovascular Disease (ASCVD): No  The 10-year ASCVD risk score Denman George DC Jr., et al., 2013) is: 34.9%   Values used to calculate the score:     Age: 69 years     Sex: Male     Is Non-Hispanic African American: Yes     Diabetic: Yes     Tobacco smoker: No     Systolic Blood Pressure: 139 mmHg     Is BP treated: Yes     HDL Cholesterol: 34 mg/dL     Total Cholesterol: 143 mg/dL    A/P: Diabetes longstanding, which appears currently controlled based on A1c of 6.6. Patient is able to verbalize appropriate hypoglycemia management plan. Medication adherence appears appropriate. -Continue current diabetes regimen. -Counseled on improving consistency of metformin dosing. -Sent rx to pharmacy for glucometer. Reviewed instructions for glucometer.  -Extensively discussed pathophysiology of diabetes, recommended lifestyle interventions, dietary effects on blood sugar control -Counseled on s/sx of and management of hypoglycemia -Next A1C anticipated March 2022.   ASCVD risk - primary prevention in patient with diabetes. Last LDL is controlled. ASCVD risk score is >20%  - high intensity statin indicated.  -Currently on moderate intensity statin - atorvastatin 10 mg  -Will discuss increasing to high intensity dose at next visit  Hypertension longstanding currently controlled.  Blood pressure goal = 130/80 mmHg. Medication adherence appropriate.  Written patient instructions provided.  Total time in  face to face counseling 20 minutes.   Follow up PCP Visit with Dr. Delford Field in one month.   Patient seen with Khayden Herzberg.  Theodis Sato, PharmD PGY-1 [facility] Pharm[facility] Resident   03/23/2020 3:53 PM

## 2020-03-29 DIAGNOSIS — Z7984 Long term (current) use of oral hypoglycemic drugs: Secondary | ICD-10-CM | POA: Diagnosis not present

## 2020-03-29 DIAGNOSIS — Z6841 Body Mass Index (BMI) 40.0 and over, adult: Secondary | ICD-10-CM | POA: Diagnosis not present

## 2020-03-29 DIAGNOSIS — I440 Atrioventricular block, first degree: Secondary | ICD-10-CM | POA: Diagnosis not present

## 2020-03-29 DIAGNOSIS — M1711 Unilateral primary osteoarthritis, right knee: Secondary | ICD-10-CM | POA: Diagnosis not present

## 2020-03-29 DIAGNOSIS — F331 Major depressive disorder, recurrent, moderate: Secondary | ICD-10-CM | POA: Diagnosis not present

## 2020-03-29 DIAGNOSIS — Z87891 Personal history of nicotine dependence: Secondary | ICD-10-CM | POA: Diagnosis not present

## 2020-03-29 DIAGNOSIS — I10 Essential (primary) hypertension: Secondary | ICD-10-CM | POA: Diagnosis not present

## 2020-03-29 DIAGNOSIS — E669 Obesity, unspecified: Secondary | ICD-10-CM | POA: Diagnosis not present

## 2020-03-29 DIAGNOSIS — E119 Type 2 diabetes mellitus without complications: Secondary | ICD-10-CM | POA: Diagnosis not present

## 2020-03-31 ENCOUNTER — Other Ambulatory Visit: Payer: Self-pay | Admitting: Critical Care Medicine

## 2020-03-31 DIAGNOSIS — M1711 Unilateral primary osteoarthritis, right knee: Secondary | ICD-10-CM

## 2020-03-31 MED FILL — TRULICITY 0.75 MG/0.5 ML PE: 0.75 | 30 days supply | Qty: 2 | Fill #1

## 2020-03-31 NOTE — Telephone Encounter (Signed)
Requested medication (s) are due for refill today: yes  Requested medication (s) are on the active medication list: yes  Last refill:  03/04/20 #60  Future visit scheduled: yes  Notes to clinic:  Please review for refill. Refill not delegated per protocol.     Requested Prescriptions  Pending Prescriptions Disp Refills   traMADol (ULTRAM) 50 MG tablet [Pharmacy Med Name: traMADol HCL 50 MG TABS 50 Tablet] 60 tablet 0    Sig: Take 1 tablet (50 mg total) by mouth every 12 (twelve) hours as needed.      Not Delegated - Analgesics:  Opioid Agonists Failed - 03/31/2020 12:29 PM      Failed - This refill cannot be delegated      Failed - Urine Drug Screen completed in last 360 days      Passed - Valid encounter within last 6 months    Recent Outpatient Visits           1 week ago Controlled type 2 diabetes mellitus without complication, without long-term current use of insulin [facility])   [place] [facility] Justin, Tec[place]seh L, RPH-CPP   3 weeks ago Acute systolic heart failure [facility])   [place] [facility] Mayers, Cari S, [place]   3 weeks ago Hypertensive urgency   [facility] Storm Frisk, MD   1 month ago Need for influenza vaccination   [facility] Drucilla Chalet, RPH-CPP   8 months ago Controlled type 2 diabetes mellitus with microalbuminuria, without long-term current use of insulin [facility]   [place] [facility] Marcine Matar, MD       Future Appointments             In 1 week Little Ishikawa, MD [facility] [place], [facility]   In 3 weeks Storm Frisk, MD [facility]   In 3 weeks Lois Huxley, Cornelius Moras, RPH-CPP [facility]

## 2020-04-01 NOTE — Telephone Encounter (Signed)
Sending rx to provider covering for Dr. Laural Benes.

## 2020-04-02 ENCOUNTER — Other Ambulatory Visit: Payer: Self-pay | Admitting: Family Medicine

## 2020-04-02 MED FILL — traMADol HCL 50 MG TABS: 50 | 30 days supply | Qty: 60 | Fill #0

## 2020-04-04 NOTE — Progress Notes (Deleted)
Cardiology Office Note:    Date:  04/04/2020   ID:  Austin Mercer., DOB 1950/10/14, MRN [id number]  PCP:  Marcine Matar, MD  Cardiologist:  No primary care provider on file.  Electrophysiologist:  None   Referring MD: Roney Jaffe, PA-C   No chief complaint on file. ***  History of Present Illness:    Austin Mercer. is a 70 y.o. male with a hx of hypertension, obesity, alcohol use, T2DM was referred by Maurene Capes, PA for evaluation of shortness of breath.  Noted to have creatinine 1.97 (1.27 on 08/01/2019), BNP 1127.  EKG with LVH, frequent PVCs.  Chest x-ray with mild pulmonary vascular congestion.  He left the ED due to wait times.  He was started on Lasix 40 mg daily.  Past Medical History:  Diagnosis Date  . Hypertension     Past Surgical History:  Procedure Laterality Date  . ANKLE FRACTURE SURGERY      Current Medications: No outpatient medications have been marked as taking for the 04/07/20 encounter (Appointment) with Little Ishikawa, MD.     Allergies:   Latex   Social History   Socioeconomic History  . Marital status: Divorced    Spouse name: Not on file  . Number of children: 2  . Years of education: Not on file  . Highest education level: Not on file  Occupational History  . Occupation: retired  Tobacco Use  . Smoking status: Former Smoker    Quit date: 1973    Years since quitting: 49.0  . Smokeless tobacco: Never Used  Vaping Use  . Vaping Use: Never used  Substance and Sexual Activity  . Alcohol use: Yes    Comment: Beer, Red wine  . Drug use: Not Currently  . Sexual activity: Not on file  Other Topics Concern  . Not on file  Social History Narrative  . Not on file   Social Determinants of Health   Financial Resource Strain: Not on file  Food Insecurity: Not on file  Transportation Needs: Not on file  Physical Activity: Not on file  Stress: Not on file  Social Connections: Not on file     Family History: The  patient's ***family history includes Diabetes in his father.  ROS:   Please see the history of present illness.    *** All other systems reviewed and are negative.  EKGs/Labs/Other Studies Reviewed:    The following studies were reviewed today: ***  EKG:  EKG is *** ordered today.  The ekg ordered today demonstrates ***  Recent Labs: 03/04/2020: ALT 23 03/05/2020: BUN 17; Creatinine, Ser 1.97; Hemoglobin 13.3; Platelets 139; Potassium 3.2; Sodium 135 03/10/2020: BNP 783.4  Recent Lipid Panel    Component Value Date/Time   CHOL 143 03/04/2020 1231   TRIG 100 03/04/2020 1231   HDL 34 (L) 03/04/2020 1231   CHOLHDL 4.2 03/04/2020 1231   LDLCALC 90 03/04/2020 1231    Physical Exam:    VS:  There were no vitals taken for this visit.    Wt Readings from Last 3 Encounters:  03/10/20 (!) 327 lb (148.3 kg)  03/06/20 (!) 343 lb (155.6 kg)  03/05/20 (!) 332 lb (150.6 kg)     GEN: *** Well nourished, well developed in no acute distress HEENT: Normal NECK: No JVD; No carotid bruits LYMPHATICS: No lymphadenopathy CARDIAC: ***RRR, no murmurs, rubs, gallops RESPIRATORY:  Clear to auscultation without rales, wheezing or rhonchi  ABDOMEN: Soft, non-tender, non-distended MUSCULOSKELETAL:  No edema; No deformity  SKIN: Warm and dry NEUROLOGIC:  Alert and oriented x 3 PSYCHIATRIC:  Normal affect   ASSESSMENT:    No diagnosis found. PLAN:    Suspected heart failure: Presented to ED with shortness of breath and found to have elevated BNP, creatinine. -Continue Lasix 40 mg -Echocardiogram  Hypertension: On amlodipine 10 mg daily, carvedilol 12.5 mg twice daily, losartan 100 mg daily, and Lasix 40 mg daily.  Hyperlipidemia: On atorvastatin 10 mg daily  T2DM: On Metformin, Trulicity  RTC in***    Medication Adjustments/Labs and Tests Ordered: Current medicines are reviewed at length with the patient today.  Concerns regarding medicines are outlined above.  No orders of the  defined types were placed in this encounter.  No orders of the defined types were placed in this encounter.   There are no Patient Instructions on file for this visit.   Signed, Little Ishikawa, MD  04/04/2020 9:47 PM    [facility]

## 2020-04-07 ENCOUNTER — Ambulatory Visit: Payer: Medicare HMO | Admitting: Cardiology

## 2020-04-12 DIAGNOSIS — Z87891 Personal history of nicotine dependence: Secondary | ICD-10-CM | POA: Diagnosis not present

## 2020-04-12 DIAGNOSIS — M1711 Unilateral primary osteoarthritis, right knee: Secondary | ICD-10-CM | POA: Diagnosis not present

## 2020-04-12 DIAGNOSIS — Z6841 Body Mass Index (BMI) 40.0 and over, adult: Secondary | ICD-10-CM | POA: Diagnosis not present

## 2020-04-12 DIAGNOSIS — E669 Obesity, unspecified: Secondary | ICD-10-CM | POA: Diagnosis not present

## 2020-04-12 DIAGNOSIS — Z7984 Long term (current) use of oral hypoglycemic drugs: Secondary | ICD-10-CM | POA: Diagnosis not present

## 2020-04-12 DIAGNOSIS — I440 Atrioventricular block, first degree: Secondary | ICD-10-CM | POA: Diagnosis not present

## 2020-04-12 DIAGNOSIS — E119 Type 2 diabetes mellitus without complications: Secondary | ICD-10-CM | POA: Diagnosis not present

## 2020-04-12 DIAGNOSIS — I10 Essential (primary) hypertension: Secondary | ICD-10-CM | POA: Diagnosis not present

## 2020-04-12 DIAGNOSIS — F331 Major depressive disorder, recurrent, moderate: Secondary | ICD-10-CM | POA: Diagnosis not present

## 2020-04-14 DIAGNOSIS — I10 Essential (primary) hypertension: Secondary | ICD-10-CM | POA: Diagnosis not present

## 2020-04-14 DIAGNOSIS — E669 Obesity, unspecified: Secondary | ICD-10-CM | POA: Diagnosis not present

## 2020-04-14 DIAGNOSIS — Z87891 Personal history of nicotine dependence: Secondary | ICD-10-CM | POA: Diagnosis not present

## 2020-04-14 DIAGNOSIS — E119 Type 2 diabetes mellitus without complications: Secondary | ICD-10-CM | POA: Diagnosis not present

## 2020-04-14 DIAGNOSIS — I440 Atrioventricular block, first degree: Secondary | ICD-10-CM | POA: Diagnosis not present

## 2020-04-14 DIAGNOSIS — Z7984 Long term (current) use of oral hypoglycemic drugs: Secondary | ICD-10-CM | POA: Diagnosis not present

## 2020-04-14 DIAGNOSIS — Z6841 Body Mass Index (BMI) 40.0 and over, adult: Secondary | ICD-10-CM | POA: Diagnosis not present

## 2020-04-14 DIAGNOSIS — F331 Major depressive disorder, recurrent, moderate: Secondary | ICD-10-CM | POA: Diagnosis not present

## 2020-04-14 DIAGNOSIS — M1711 Unilateral primary osteoarthritis, right knee: Secondary | ICD-10-CM | POA: Diagnosis not present

## 2020-04-21 ENCOUNTER — Encounter: Payer: Self-pay | Admitting: Critical Care Medicine

## 2020-04-21 ENCOUNTER — Other Ambulatory Visit: Payer: Self-pay | Admitting: Internal Medicine

## 2020-04-21 ENCOUNTER — Other Ambulatory Visit: Payer: Self-pay | Admitting: Pharmacist

## 2020-04-21 ENCOUNTER — Ambulatory Visit: Payer: Medicare HMO | Attending: Critical Care Medicine | Admitting: Critical Care Medicine

## 2020-04-21 ENCOUNTER — Other Ambulatory Visit: Payer: Self-pay

## 2020-04-21 ENCOUNTER — Ambulatory Visit (HOSPITAL_BASED_OUTPATIENT_CLINIC_OR_DEPARTMENT_OTHER): Payer: Medicare HMO | Admitting: Pharmacist

## 2020-04-21 VITALS — BP 133/84 | HR 81 | Temp 97.9°F | Resp 20 | Wt 320.0 lb

## 2020-04-21 DIAGNOSIS — B351 Tinea unguium: Secondary | ICD-10-CM | POA: Insufficient documentation

## 2020-04-21 DIAGNOSIS — E119 Type 2 diabetes mellitus without complications: Secondary | ICD-10-CM

## 2020-04-21 DIAGNOSIS — Z23 Encounter for immunization: Secondary | ICD-10-CM | POA: Diagnosis not present

## 2020-04-21 DIAGNOSIS — Z6841 Body Mass Index (BMI) 40.0 and over, adult: Secondary | ICD-10-CM

## 2020-04-21 DIAGNOSIS — I10 Essential (primary) hypertension: Secondary | ICD-10-CM | POA: Diagnosis not present

## 2020-04-21 DIAGNOSIS — I5022 Chronic systolic (congestive) heart failure: Secondary | ICD-10-CM

## 2020-04-21 DIAGNOSIS — M1711 Unilateral primary osteoarthritis, right knee: Secondary | ICD-10-CM | POA: Diagnosis not present

## 2020-04-21 DIAGNOSIS — Z1211 Encounter for screening for malignant neoplasm of colon: Secondary | ICD-10-CM | POA: Diagnosis not present

## 2020-04-21 LAB — GLUCOSE, POCT (MANUAL RESULT ENTRY): POC Glucose: 127 mg/dl — AB (ref 70–99)

## 2020-04-21 MED ORDER — ACCU-CHEK GUIDE W/DEVICE KIT: 1.0000 | PACK | Freq: Every day | 0 refills | Status: DC

## 2020-04-21 MED ORDER — TRAMADOL HCL 50 MG PO TABS: 50.0000 mg | ORAL_TABLET | Freq: Two times a day (BID) | ORAL | 0 refills | Status: DC | PRN

## 2020-04-21 MED FILL — traMADol HCL 50 MG TABS: 50 | 30 days supply | Qty: 60 | Fill #0

## 2020-04-21 MED FILL — ACCU-CHEK GUIDE W/DEVICE KI: W/DEVICE | 30 days supply | Qty: 1 | Fill #0

## 2020-04-21 NOTE — Assessment & Plan Note (Signed)
Hypertension with improved control   Continue current medications and refill without change

## 2020-04-21 NOTE — Progress Notes (Signed)
Subjective:    Patient ID: Austin Baseman., male    DOB: Oct 18, 1950, 70 y.o.   MRN: [id number]  03/04/2020 70 y.o.M retired veteran primary care patient of Dr. Wynetta Emery has not been seen since April of this year has run out of his medications for approximately a week and on arrival has a blood pressure of 199/113.  He has significant increased dyspnea with this and weight gain of 10 pounds since the last visit.  He notes bilateral lower extremity edema and swelling.  He has polyuria polydipsia.  He has a productive cough with some wheezing and abdominal distention abdominal pain frequent bowel movements that are formed.  He relates the bowel movement situation to the use of the Metformin which he is on 1000 mg twice daily.  Note on arrival the patient's hemoglobin A1c is actually quite good at 6.6.  Patient does note orthopnea. Does note bilateral chronic knee pain as well. Patient states on the Metformin to 1000 g twice daily he has increased bowel movements  Patient also complains of dysphoric mood however no plans for suicidal ideation   Hypertensive urgency Patient out of medications and currently in first-degree AV block with nonspecific T wave abnormalities prolonged QTC and left atrial enlargement left ventricular hypertrophy as well as premature ventricular contractions  Blood pressure out of control on arrival 193/112 patient without any specific endorgan dysfunction other than the dyspnea  After 2 doses of 0.2 mg clonidine blood pressure down to 161/109  I will resume amlodipine 10 mg daily, increase Coreg to 12.5 mg twice daily, resume losartan 100 mg daily, increase furosemide to 40 mg daily, continue potassium supplementation  Refills on all these medications sent to our pharmacy at this date  Plan to refer to cardiology  Would also like for visiting nurse to check on the patient for a one-time check on blood pressure management and follow-up and medication  adherence  PVC (premature ventricular contraction) As per hypertension assessment  First degree AV block As per hypertension assessment  Controlled type 2 diabetes mellitus without complication, without long-term current use of insulin (HCC) Hemoglobin A1c actually at goal however will switch to Trulicity once weekly 4.14 mg and Metformin 500 mg twice daily due to side effects from the higher dose of Metformin  We will have the patient seen by her clinical pharmacist in a week and he will return to see me in 3 weeks  Chronic, continuous use of opioids Patient on chronic opioid use I checked the drug database no other prescribers will refill the tramadol that he has had previously  Depression Patient scores high on PHQ-9 and I have referred this patient to our clinical social worker for follow-up  Class 3 severe obesity due to excess calories with serious comorbidity and body mass index (BMI) of 45.0 to 49.9 in adult [facility]) Severe obesity I have given the patient dietary plan to follow   Austin Mercer was seen today for shortness of breath.  Diagnoses and all orders for this visit:  Hypertensive urgency -     Ambulatory referral to [place] -     Ambulatory referral to Cardiology  Essential hypertension -     Discontinue: cloNIDine (CATAPRES) tablet 0.2 mg -     cloNIDine (CATAPRES) tablet 0.2 mg -     CBC with Differential/Platelet -     Ambulatory referral to [place] -     furosemide (LASIX) 40 MG tablet; Take 1 tablet (40 mg total) by mouth  daily. -     potassium chloride (KLOR-CON) 8 MEQ tablet; Take 2 tablets (16 mEq total) by mouth 2 (two) times daily. -     losartan (COZAAR) 100 MG tablet; Take 1 tablet (100 mg total) by mouth daily. -     amLODipine (NORVASC) 10 MG tablet; Take 1 tablet (10 mg total) by mouth daily. -     carvedilol (COREG) 12.5 MG tablet; Take 1 tablet (12.5 mg total) by mouth 2 (two) times daily with a meal. Take 1 tablet p.o. twice a day -      cloNIDine (CATAPRES) tablet 0.2 mg  Controlled type 2 diabetes mellitus with microalbuminuria, without long-term current use of insulin (HCC) -     POCT glucose (manual entry) -     POCT glycosylated hemoglobin (Hb A1C) -     atorvastatin (LIPITOR) 10 MG tablet; Take 1 tablet (10 mg total) by mouth daily. -     metFORMIN (GLUCOPHAGE) 500 MG tablet; Take 1 tablet (500 mg total) by mouth 2 (two) times daily with a meal.  Controlled type 2 diabetes mellitus without complication, without long-term current use of insulin (HCC) -     Comprehensive metabolic panel -     Lipid panel -     CBC with Differential/Platelet -     Discontinue: Accu-Chek FastClix Lancets MISC; Use as directed to test blood sugar once daily. DX: E11.9 -     Discontinue: glucose blood (ACCU-CHEK GUIDE) test strip; Use as directed to test blood sugar once daily. DX: E11.9 -     glucose blood (ACCU-CHEK GUIDE) test strip; Use as directed to test blood sugar once daily. DX: E11.9 -     Discontinue: Accu-Chek FastClix Lancets MISC; Use as directed to test blood sugar once daily. DX: E11.9  Primary osteoarthritis of right knee -     traMADol (ULTRAM) 50 MG tablet; Take 1 tablet (50 mg total) by mouth every 12 (twelve) hours as needed.  First degree AV block -     Ambulatory referral to Cardiology  PVC (premature ventricular contraction) -     Ambulatory referral to Cardiology  Chronic, continuous use of opioids  Moderate episode of recurrent major depressive disorder (HCC)  Class 3 severe obesity due to excess calories with serious comorbidity and body mass index (BMI) of 45.0 to 49.9 in adult [facility])  Other orders -     Dulaglutide (TRULICITY) 4.85 IO/2.7OJ SOPN; Inject 0.75 mg into the skin once a week. -     diclofenac Sodium (VOLTAREN) 1 % GEL; Apply 2 g topically 4 (four) times daily. To knees -     Blood Pressure Monitoring (BLOOD PRESSURE MONITOR 7) DEVI; Use to monitor blood pressure.   Patient  to obtain a blood pressure meter and record values also on the refer this patient to home health this for visiting nurse to check on the patient  Pt then went to ED and f/u later by [place] PA: Aiken Tally Joe. reports that he was seen at the emergency department at [facility] on March 06, 2020.  Hospital course  Patient with a history of hypertension obesity presents with shortness of breath. Patient is a very poor historian, but reports increasing shortness of breath in the past week. He thinks it is all in his abdomen. Denies any chest pain. Recently seen by his PCP and had his prescriptions refilled  Patient went to the [facility] ER yesterday but left due to wait times. X-ray at that  time was negative, but I repeated the chest x-ray here which revealed vascular congestion. Clinically patient appears to be in CHF and has a BNP over thousand. Labs from that ER visit were reviewed. We will start nitroglycerin and lasix 5:23 AM Patient is hesitant to be admitted. He would prefer to have ER treatment to see if he can be improved. Lasix and nitroglycerin has been given to patient 7:16 AM Patient has significant urine output. He ambulated without any hypoxia. He reports his work of breathing is improved. He is hypertensive, though I suspect some of these readings are erroneous because he keeps moving around in the room Discussed at length need to follow-up with his PCP and take his medications. He claims to have a follow-up appointment next week. Patient is still insistent on being discharged home.   Reports that since he has been home, he is feeling a little better, but does endorse that  he is still haivng some difficulty breathing.  Patient is unsure if he is taking his Lasix on a daily basis, denies following a low-sodium diet he has not laying on a daily basis     04/21/2020 This patient returns for primary care follow-up visit with history of hypertension poorly  controlled heart failure acute systolic, type 2 diabetes, arthritis of the knee, severe obesity, history of hepatitis C virus infection previously cured.  This patient states he is having no symptoms at this time his blood sugar on arrival is 120 his blood pressure is outstanding at this visit 133/84 Patient is not had any emergency room visits since the last office visit Patient does have a visiting nurse.  Patient's received all of his COVID vaccines.  Wt Readings from Last 3 Encounters:  04/21/20 (!) 320 lb (145.2 kg)  03/10/20 (!) 327 lb (148.3 kg)  03/06/20 (!) 343 lb (155.6 kg)   Past Medical History:  Diagnosis Date  . Hypertension   . Hypertensive urgency 03/04/2020     Family History  Problem Relation Age of Onset  . Diabetes Father      Social History   Socioeconomic History  . Marital status: Divorced    Spouse name: Not on file  . Number of children: 2  . Years of education: Not on file  . Highest education level: Not on file  Occupational History  . Occupation: retired  Tobacco Use  . Smoking status: Former Smoker    Quit date: 1973    Years since quitting: 49.0  . Smokeless tobacco: Never Used  Vaping Use  . Vaping Use: Never used  Substance and Sexual Activity  . Alcohol use: Yes    Comment: Beer, Red wine  . Drug use: Not Currently  . Sexual activity: Not on file  Other Topics Concern  . Not on file  Social History Narrative  . Not on file   Social Determinants of Health   Financial Resource Strain: Not on file  Food Insecurity: Not on file  Transportation Needs: Not on file  Physical Activity: Not on file  Stress: Not on file  Social Connections: Not on file  Intimate Partner Violence: Not on file     Allergies  Allergen Reactions  . Latex Itching     Outpatient Medications Prior to Visit  Medication Sig Dispense Refill  . amLODipine (NORVASC) 10 MG tablet Take 1 tablet (10 mg total) by mouth daily. 90 tablet 1  . atorvastatin  (LIPITOR) 10 MG tablet Take 1 tablet (10 mg total) by mouth  daily. 90 tablet 1  . carvedilol (COREG) 12.5 MG tablet Take 1 tablet (12.5 mg total) by mouth 2 (two) times daily with a meal. Take 1 tablet p.o. twice a day 60 tablet 4  . diclofenac Sodium (VOLTAREN) 1 % GEL Apply 2 g topically 4 (four) times daily. To knees 50 g 4  . Dulaglutide (TRULICITY) 5.73 UK/0.2RK SOPN Inject 0.75 mg into the skin once a week. 2 mL 4  . furosemide (LASIX) 40 MG tablet Take 1 tablet (40 mg total) by mouth daily. 90 tablet 2  . glucose blood (ACCU-CHEK GUIDE) test strip Use as directed to test blood sugar once daily. DX: E11.9 100 each 12  . losartan (COZAAR) 100 MG tablet Take 1 tablet (100 mg total) by mouth daily. 90 tablet 2  . metFORMIN (GLUCOPHAGE) 500 MG tablet Take 1 tablet (500 mg total) by mouth 2 (two) times daily with a meal. 180 tablet 1  . potassium chloride (KLOR-CON) 8 MEQ tablet Take 2 tablets (16 mEq total) by mouth 2 (two) times daily. 120 tablet 6  . tadalafil (ADCIRCA/CIALIS) 20 MG tablet 1 tab PO Q 1 hr prior to sex as needed.  Limit use to 1 tab/24 hr 20 tablet 1  . Accu-Chek Softclix Lancets lancets Use as directed to test blood sugar once daily. DX: E11.9 100 each 2  . Blood Pressure Monitoring (BLOOD PRESSURE MONITOR 7) DEVI Use to monitor blood pressure. 1 each 0  . Blood Glucose Monitoring Suppl (ACCU-CHEK GUIDE) w/Device KIT 1 each by Does not apply route daily. Use as directed to test blood sugar once daily. DX: E11.9 1 kit 0  . traMADol (ULTRAM) 50 MG tablet TAKE 1 TABLET (50 MG TOTAL) BY MOUTH EVERY 12 (TWELVE) HOURS AS NEEDED. (Patient not taking: Reported on 04/21/2020) 60 tablet 0   No facility-administered medications prior to visit.      Review of Systems  HENT: Positive for congestion, postnasal drip and rhinorrhea. Negative for dental problem, drooling, ear discharge, ear pain, sinus pressure, sore throat and trouble swallowing.   Eyes: Negative for visual disturbance.   Respiratory: Negative for cough, chest tightness, shortness of breath and wheezing.   Cardiovascular: Positive for leg swelling. Negative for chest pain.  Gastrointestinal: Negative for abdominal distention, abdominal pain, blood in stool, nausea and vomiting.  Endocrine: Positive for polyuria. Negative for polydipsia and polyphagia.  Genitourinary: Positive for frequency. Negative for dysuria.  Musculoskeletal: Negative for gait problem and myalgias.       Knee pain   Skin: Negative for rash.  Allergic/Immunologic: Negative for immunocompromised state.  Neurological: Negative for dizziness, tremors, seizures, syncope, speech difficulty, light-headedness, numbness and headaches.  Psychiatric/Behavioral: Negative for dysphoric mood, self-injury and suicidal ideas. The patient is not nervous/anxious.        Objective:   Physical Exam Vitals:   04/21/20 0958  BP: 133/84  Pulse: 81  Resp: 20  Temp: 97.9 F (36.6 C)  TempSrc: Oral  SpO2: 97%  Weight: (!) 320 lb (145.2 kg)    Gen: Pleasant, obese, in no distress,  normal affect  ENT: No lesions,  mouth clear,  oropharynx clear, no postnasal drip  Neck: No JVD, no TMG, no carotid bruits  Lungs: No use of accessory muscles, no dullness to percussion, clear without rales or rhonchi  Cardiovascular: RRR, heart sounds normal, no murmur or gallops, 3+ peripheral edema  Abdomen: soft and NT, no HSM,  BS normal  Musculoskeletal: No deformities, no cyanosis or clubbing  Neuro: alert, non focal  Skin: Warm, no lesions or rashes Foot exam shows severe onychomycosis and ingrown toenails     Assessment & Plan:  I personally reviewed all images and lab data in the [facility] system as well as any outside material available during this office visit and agree with the  radiology impressions.   Chronic systolic heart failure (HCC) Chronic systolic heart failure stable at this time  No change in medications  Essential  hypertension Hypertension with improved control   Continue current medications and refill without change  Controlled type 2 diabetes mellitus without complication, without long-term current use of insulin (HCC) Controlled diabetes continue metformin  Primary osteoarthritis of right knee Has been on tramadol in the past we will refill this and I checked the [place] drug database  Toenail fungus Referral to podiatry with toenail fungus and ingrown toenails  Class 3 severe obesity due to excess calories with serious comorbidity and body mass index (BMI) of 45.0 to 49.9 in adult [facility]) Discussion regarding patient's dietary habits given   Austin Mercer was seen today for follow-up.  Diagnoses and all orders for this visit:  Controlled type 2 diabetes mellitus without complication, without long-term current use of insulin ([place]) -     Ambulatory referral to Podiatry -     Glucose (CBG)  Primary osteoarthritis of right knee -     traMADol (ULTRAM) 50 MG tablet; Take 1 tablet (50 mg total) by mouth every 12 (twelve) hours as needed.  Colon cancer screening -     Cologuard  Toenail fungus -     Ambulatory referral to Podiatry  Chronic systolic heart failure ([place])  Essential hypertension  Class 3 severe obesity due to excess calories with serious comorbidity and body mass index (BMI) of 40.0 to 44.9 in adult [facility])  Other orders -     Pneumococcal polysaccharide vaccine 23-valent greater than or equal to 2yo subcutaneous/IM -     Tdap vaccine greater than or equal to 7yo IM  Tetanus vaccine Pneumovax given at this visit  Referral to podiatry given  Screen for colon cancer with Cologuard testing

## 2020-04-21 NOTE — Progress Notes (Signed)
Patient was educated on the use of the Trulicity pen. Reviewed necessary supplies and operation of the pen. Also reviewed goal blood glucose levels. Patient was able to demonstrate use. All questions and concerns were addressed.  Follow up with pharmacy in 4 wks.  Adam Phenix, PharmD Student  Fabio Neighbors, PharmD, BCPS PGY2 Ambulatory Care Resident [facility]  Pharmacy

## 2020-04-21 NOTE — Progress Notes (Signed)
1 month f/u-  Taking medications-

## 2020-04-21 NOTE — Assessment & Plan Note (Signed)
Referral to podiatry with toenail fungus and ingrown toenails

## 2020-04-21 NOTE — Assessment & Plan Note (Signed)
Controlled diabetes continue metformin

## 2020-04-21 NOTE — Patient Instructions (Signed)
Referral to podiatry we may for your toenails  No change in your medications please get refills of her Coreg today  All your medications have refills on them  Pneumovax 23 valent and Tdap vaccines were given today  A Cologuard kit will be mailed to your house please process at [facility] back in this is to check you for colon cancer  Return to see Dr. Joya Gaskins in 3 months

## 2020-04-21 NOTE — Assessment & Plan Note (Signed)
Chronic systolic heart failure stable at this time  No change in medications

## 2020-04-21 NOTE — Assessment & Plan Note (Signed)
Has been on tramadol in the past we will refill this and I checked the [facility] drug database

## 2020-04-21 NOTE — Assessment & Plan Note (Signed)
Discussion regarding patient's dietary habits given

## 2020-04-23 ENCOUNTER — Encounter: Payer: Self-pay | Admitting: Pharmacist

## 2020-04-23 ENCOUNTER — Other Ambulatory Visit: Payer: Self-pay

## 2020-04-23 ENCOUNTER — Ambulatory Visit: Payer: Medicare HMO | Attending: Critical Care Medicine | Admitting: Pharmacist

## 2020-04-23 DIAGNOSIS — E119 Type 2 diabetes mellitus without complications: Secondary | ICD-10-CM | POA: Diagnosis not present

## 2020-04-23 LAB — GLUCOSE, POCT (MANUAL RESULT ENTRY): POC Glucose: 115 mg/dl — AB (ref 70–99)

## 2020-04-23 NOTE — Progress Notes (Signed)
    S:     No chief complaint on file.   Patient arrives well and in good spirits. Presents for diabetes evaluation, education, and management. Patient was referred and last seen by Primary Care Provider, Dr. Delford Field, on 04/21/2020.   Today, patient reports doing well. He is tolerating his Trulicity well. Accessibility to food appears to be a problem for him. He has obtained his meter, however, he did not bring this with him today. He reports that he doing better with adherence concerning the metformin. Denies any missed doses of Trulicity.  Family/Social History: former smoker, family history of diabetes   Insurance coverage/medication affordability: VA coverage, [employer] secondary   Medication adherence reported. Current diabetes medications include: metformin 500 mg BID, Trulicity 0.75 mg weekly Current hypertension medications include: carvedilol 12.5mg  BID, losartan 100 mg daily, amlodipine 10 mg daily Current hyperlipidemia medications include: atorvastatin 10 mg daily   Patient denies hypoglycemic events.  Patient reported dietary habits: Eats 1-2 meals/day Diet is restricted due to limited access to food.    Patient-reported exercise habits: none reported   Patient denies nocturia (nighttime urination).  Patient denies neuropathy (nerve pain). Patient denies visual changes. Patient reports self foot exams.     O: POCT glucose: 115  Lab Results  Component Value Date   HGBA1C 6.6 (A) 03/04/2020   There were no vitals filed for this visit.  Lipid Panel     Component Value Date/Time   CHOL 143 03/04/2020 1231   TRIG 100 03/04/2020 1231   HDL 34 (L) 03/04/2020 1231   CHOLHDL 4.2 03/04/2020 1231   LDLCALC 90 03/04/2020 1231    Home fasting blood sugars:  - No glucometer or log with him - He reports to me that his CBGs at home range in the 120s-130s.   Clinical Atherosclerotic Cardiovascular Disease (ASCVD): No  The 10-year ASCVD risk score Denman George DC Jr., et al.,  2013) is: 32.6%   Values used to calculate the score:     Age: 70 years     Sex: Male     Is Non-Hispanic African American: Yes     Diabetic: Yes     Tobacco smoker: No     Systolic Blood Pressure: 133 mmHg     Is BP treated: Yes     HDL Cholesterol: 34 mg/dL     Total Cholesterol: 143 mg/dL    A/P: Diabetes longstanding currently controlled based on A1c of 6.6. Patient is able to verbalize appropriate hypoglycemia management plan. Medication adherence appears appropriate. -Continue current diabetes regimen. -Counseled on improving consistency of metformin dosing. -Extensively discussed pathophysiology of diabetes, recommended lifestyle interventions, dietary effects on blood sugar control -Counseled on s/sx of and management of hypoglycemia -Next A1C anticipated March 2022.   ASCVD risk - primary prevention in patient with diabetes. Last LDL is controlled. ASCVD risk score is >20%  - high intensity statin indicated.  -Currently on moderate intensity statin - atorvastatin 10 mg   Hypertension longstanding currently controlled.  Blood pressure goal = 130/80 mmHg. Medication adherence appropriate.  Written patient instructions provided.  Total time in face to face counseling 20 minutes.   Follow up PCP Visit with Dr. Delford Field in April.   Butch Penny, PharmD, [place], CPP Clinical Pharmacist [facility] [phone]

## 2020-04-26 DIAGNOSIS — Z6841 Body Mass Index (BMI) 40.0 and over, adult: Secondary | ICD-10-CM | POA: Diagnosis not present

## 2020-04-26 DIAGNOSIS — I10 Essential (primary) hypertension: Secondary | ICD-10-CM | POA: Diagnosis not present

## 2020-04-26 DIAGNOSIS — E669 Obesity, unspecified: Secondary | ICD-10-CM | POA: Diagnosis not present

## 2020-04-26 DIAGNOSIS — Z87891 Personal history of nicotine dependence: Secondary | ICD-10-CM | POA: Diagnosis not present

## 2020-04-26 DIAGNOSIS — I440 Atrioventricular block, first degree: Secondary | ICD-10-CM | POA: Diagnosis not present

## 2020-04-26 DIAGNOSIS — M1711 Unilateral primary osteoarthritis, right knee: Secondary | ICD-10-CM | POA: Diagnosis not present

## 2020-04-26 DIAGNOSIS — F331 Major depressive disorder, recurrent, moderate: Secondary | ICD-10-CM | POA: Diagnosis not present

## 2020-04-26 DIAGNOSIS — E119 Type 2 diabetes mellitus without complications: Secondary | ICD-10-CM | POA: Diagnosis not present

## 2020-04-26 DIAGNOSIS — Z7984 Long term (current) use of oral hypoglycemic drugs: Secondary | ICD-10-CM | POA: Diagnosis not present

## 2020-04-26 MED FILL — POTASSIUM CHLORIDE ER 8 MEQ: 8 | 30 days supply | Qty: 120 | Fill #1

## 2020-04-26 MED FILL — CARVEDILOL 12.5 MG TABLET: 12.5 | 30 days supply | Qty: 60 | Fill #1

## 2020-05-03 DIAGNOSIS — M1711 Unilateral primary osteoarthritis, right knee: Secondary | ICD-10-CM | POA: Diagnosis not present

## 2020-05-03 DIAGNOSIS — Z6841 Body Mass Index (BMI) 40.0 and over, adult: Secondary | ICD-10-CM | POA: Diagnosis not present

## 2020-05-03 DIAGNOSIS — E669 Obesity, unspecified: Secondary | ICD-10-CM | POA: Diagnosis not present

## 2020-05-03 DIAGNOSIS — F331 Major depressive disorder, recurrent, moderate: Secondary | ICD-10-CM | POA: Diagnosis not present

## 2020-05-03 DIAGNOSIS — I10 Essential (primary) hypertension: Secondary | ICD-10-CM | POA: Diagnosis not present

## 2020-05-03 DIAGNOSIS — E119 Type 2 diabetes mellitus without complications: Secondary | ICD-10-CM | POA: Diagnosis not present

## 2020-05-03 DIAGNOSIS — Z87891 Personal history of nicotine dependence: Secondary | ICD-10-CM | POA: Diagnosis not present

## 2020-05-03 DIAGNOSIS — Z7984 Long term (current) use of oral hypoglycemic drugs: Secondary | ICD-10-CM | POA: Diagnosis not present

## 2020-05-03 DIAGNOSIS — I440 Atrioventricular block, first degree: Secondary | ICD-10-CM | POA: Diagnosis not present

## 2020-05-04 ENCOUNTER — Telehealth: Payer: Self-pay | Admitting: Critical Care Medicine

## 2020-05-04 NOTE — Telephone Encounter (Signed)
Copied from CRM [id number]. Topic: General - Other >> May 04, 2020  9:34 AM Jaquita Rector A wrote: Reason for CRM: Madelin Rear with [employer] called in to inform Dr Delford Field about an order that was sent in just letting Dr Delford Field know his order will be cancelled because it is a duplicate already have an orser from the practice from another provider. Any questions please call Ph# [phone] Case# [id number]

## 2020-05-05 MED FILL — TRULICITY 0.75 MG/0.5 ML PE: 0.75 | 30 days supply | Qty: 2 | Fill #2

## 2020-05-06 ENCOUNTER — Telehealth: Payer: Self-pay | Admitting: Critical Care Medicine

## 2020-05-06 NOTE — Telephone Encounter (Signed)
Copied from CRM [id number]. Topic: General - Other >> May 06, 2020  9:25 AM Gaetana Michaelis A wrote: [facility] made contact to notify PCP that patient has an existing Cologuard order under a different provider  Case number for reference [id number]

## 2020-05-10 LAB — COLOGUARD

## 2020-05-11 DIAGNOSIS — I10 Essential (primary) hypertension: Secondary | ICD-10-CM | POA: Diagnosis not present

## 2020-05-11 DIAGNOSIS — Z6841 Body Mass Index (BMI) 40.0 and over, adult: Secondary | ICD-10-CM | POA: Diagnosis not present

## 2020-05-11 DIAGNOSIS — E119 Type 2 diabetes mellitus without complications: Secondary | ICD-10-CM | POA: Diagnosis not present

## 2020-05-11 DIAGNOSIS — F331 Major depressive disorder, recurrent, moderate: Secondary | ICD-10-CM | POA: Diagnosis not present

## 2020-05-11 DIAGNOSIS — E669 Obesity, unspecified: Secondary | ICD-10-CM | POA: Diagnosis not present

## 2020-05-11 DIAGNOSIS — Z7984 Long term (current) use of oral hypoglycemic drugs: Secondary | ICD-10-CM | POA: Diagnosis not present

## 2020-05-11 DIAGNOSIS — M1711 Unilateral primary osteoarthritis, right knee: Secondary | ICD-10-CM | POA: Diagnosis not present

## 2020-05-11 DIAGNOSIS — Z87891 Personal history of nicotine dependence: Secondary | ICD-10-CM | POA: Diagnosis not present

## 2020-05-11 DIAGNOSIS — I440 Atrioventricular block, first degree: Secondary | ICD-10-CM | POA: Diagnosis not present

## 2020-05-14 DIAGNOSIS — I10 Essential (primary) hypertension: Secondary | ICD-10-CM | POA: Diagnosis not present

## 2020-05-14 DIAGNOSIS — E669 Obesity, unspecified: Secondary | ICD-10-CM | POA: Diagnosis not present

## 2020-05-14 DIAGNOSIS — E119 Type 2 diabetes mellitus without complications: Secondary | ICD-10-CM | POA: Diagnosis not present

## 2020-05-14 DIAGNOSIS — Z6841 Body Mass Index (BMI) 40.0 and over, adult: Secondary | ICD-10-CM | POA: Diagnosis not present

## 2020-05-14 DIAGNOSIS — F331 Major depressive disorder, recurrent, moderate: Secondary | ICD-10-CM | POA: Diagnosis not present

## 2020-05-14 DIAGNOSIS — M1711 Unilateral primary osteoarthritis, right knee: Secondary | ICD-10-CM | POA: Diagnosis not present

## 2020-05-14 DIAGNOSIS — Z87891 Personal history of nicotine dependence: Secondary | ICD-10-CM | POA: Diagnosis not present

## 2020-05-14 DIAGNOSIS — I440 Atrioventricular block, first degree: Secondary | ICD-10-CM | POA: Diagnosis not present

## 2020-05-14 DIAGNOSIS — Z7984 Long term (current) use of oral hypoglycemic drugs: Secondary | ICD-10-CM | POA: Diagnosis not present

## 2020-05-19 ENCOUNTER — Ambulatory Visit: Payer: Medicare HMO | Admitting: Podiatry

## 2020-05-20 MED FILL — traMADol HCL 50 MG TABS: 50 | 30 days supply | Qty: 60 | Fill #0

## 2020-05-25 DIAGNOSIS — F331 Major depressive disorder, recurrent, moderate: Secondary | ICD-10-CM | POA: Diagnosis not present

## 2020-05-25 DIAGNOSIS — Z7984 Long term (current) use of oral hypoglycemic drugs: Secondary | ICD-10-CM | POA: Diagnosis not present

## 2020-05-25 DIAGNOSIS — Z6841 Body Mass Index (BMI) 40.0 and over, adult: Secondary | ICD-10-CM | POA: Diagnosis not present

## 2020-05-25 DIAGNOSIS — I440 Atrioventricular block, first degree: Secondary | ICD-10-CM | POA: Diagnosis not present

## 2020-05-25 DIAGNOSIS — I10 Essential (primary) hypertension: Secondary | ICD-10-CM | POA: Diagnosis not present

## 2020-05-25 DIAGNOSIS — E669 Obesity, unspecified: Secondary | ICD-10-CM | POA: Diagnosis not present

## 2020-05-25 DIAGNOSIS — M1711 Unilateral primary osteoarthritis, right knee: Secondary | ICD-10-CM | POA: Diagnosis not present

## 2020-05-25 DIAGNOSIS — E119 Type 2 diabetes mellitus without complications: Secondary | ICD-10-CM | POA: Diagnosis not present

## 2020-05-25 DIAGNOSIS — Z87891 Personal history of nicotine dependence: Secondary | ICD-10-CM | POA: Diagnosis not present

## 2020-05-26 ENCOUNTER — Telehealth: Payer: Self-pay | Admitting: Critical Care Medicine

## 2020-05-26 DIAGNOSIS — M1711 Unilateral primary osteoarthritis, right knee: Secondary | ICD-10-CM

## 2020-05-26 DIAGNOSIS — R296 Repeated falls: Secondary | ICD-10-CM

## 2020-05-26 DIAGNOSIS — I10 Essential (primary) hypertension: Secondary | ICD-10-CM

## 2020-05-26 DIAGNOSIS — I5022 Chronic systolic (congestive) heart failure: Secondary | ICD-10-CM

## 2020-05-26 NOTE — Telephone Encounter (Signed)
Copied from CRM [id number]. Topic: General - Other >> May 25, 2020  3:15 PM Marylen Ponto wrote: Reason for CRM: Harvie Heck with [facility] called to report pt blood pressure reading was 162/100 before taking his medication. Harvie Heck stated pt has not been compliant with taking medications in the evenings for about a week. Harvie Heck also stated pt complained of having several falls in the last few weeks. Cb# [phone]

## 2020-05-27 ENCOUNTER — Ambulatory Visit: Payer: Medicare HMO | Admitting: Podiatry

## 2020-05-27 ENCOUNTER — Other Ambulatory Visit: Payer: Self-pay | Admitting: Critical Care Medicine

## 2020-05-27 MED ORDER — CARVEDILOL 25 MG PO TABS: 25.0000 mg | ORAL_TABLET | Freq: Two times a day (BID) | ORAL | 3 refills | Status: DC

## 2020-05-27 MED FILL — CARVEDILOL 25 MG TABLET: 25 | 30 days supply | Qty: 60 | Fill #0

## 2020-05-27 NOTE — Telephone Encounter (Signed)
Randy with [facility] called to follow up, please CB.

## 2020-05-27 NOTE — Telephone Encounter (Signed)
Left message on voicemail to return call.

## 2020-05-27 NOTE — Telephone Encounter (Signed)
Needs Home PT evaluation. Order sent.  Needs increase in coreg to 25mg  bid, rx sent to our pharmacy

## 2020-05-27 NOTE — Telephone Encounter (Signed)
Called Harvie Heck unable to reach, left VM to call back.

## 2020-05-28 NOTE — Telephone Encounter (Signed)
Harvie Heck returned phone call. I advised Harvie Heck that Dr. Delford Field had received the message and advised the patient accordingly. Randy thanked Dr. Delford Field and just wanted to verify that the message had been received.

## 2020-06-01 ENCOUNTER — Other Ambulatory Visit: Payer: Self-pay

## 2020-06-01 ENCOUNTER — Other Ambulatory Visit: Payer: Self-pay | Admitting: Emergency Medicine

## 2020-06-01 ENCOUNTER — Emergency Department (HOSPITAL_COMMUNITY): Payer: No Typology Code available for payment source

## 2020-06-01 ENCOUNTER — Encounter (HOSPITAL_COMMUNITY): Payer: Self-pay | Admitting: Emergency Medicine

## 2020-06-01 ENCOUNTER — Emergency Department (HOSPITAL_COMMUNITY)
Admission: EM | Admit: 2020-06-01 | Discharge: 2020-06-01 | Disposition: A | Discharge status: Home / Self Care | Payer: No Typology Code available for payment source | Attending: Emergency Medicine | Admitting: Emergency Medicine

## 2020-06-01 DIAGNOSIS — E669 Obesity, unspecified: Secondary | ICD-10-CM | POA: Diagnosis not present

## 2020-06-01 DIAGNOSIS — R609 Edema, unspecified: Secondary | ICD-10-CM | POA: Diagnosis not present

## 2020-06-01 DIAGNOSIS — I5022 Chronic systolic (congestive) heart failure: Secondary | ICD-10-CM | POA: Insufficient documentation

## 2020-06-01 DIAGNOSIS — Z7984 Long term (current) use of oral hypoglycemic drugs: Secondary | ICD-10-CM | POA: Diagnosis not present

## 2020-06-01 DIAGNOSIS — Z79899 Other long term (current) drug therapy: Secondary | ICD-10-CM | POA: Diagnosis not present

## 2020-06-01 DIAGNOSIS — M170 Bilateral primary osteoarthritis of knee: Secondary | ICD-10-CM

## 2020-06-01 DIAGNOSIS — Z87891 Personal history of nicotine dependence: Secondary | ICD-10-CM | POA: Insufficient documentation

## 2020-06-01 DIAGNOSIS — E119 Type 2 diabetes mellitus without complications: Secondary | ICD-10-CM | POA: Diagnosis not present

## 2020-06-01 DIAGNOSIS — G4489 Other headache syndrome: Secondary | ICD-10-CM | POA: Diagnosis not present

## 2020-06-01 DIAGNOSIS — F331 Major depressive disorder, recurrent, moderate: Secondary | ICD-10-CM | POA: Diagnosis not present

## 2020-06-01 DIAGNOSIS — R0902 Hypoxemia: Secondary | ICD-10-CM | POA: Diagnosis not present

## 2020-06-01 DIAGNOSIS — Z91040 Latex allergy status: Secondary | ICD-10-CM | POA: Diagnosis not present

## 2020-06-01 DIAGNOSIS — M1711 Unilateral primary osteoarthritis, right knee: Secondary | ICD-10-CM | POA: Diagnosis not present

## 2020-06-01 DIAGNOSIS — I110 Hypertensive heart disease with heart failure: Secondary | ICD-10-CM | POA: Insufficient documentation

## 2020-06-01 DIAGNOSIS — I10 Essential (primary) hypertension: Secondary | ICD-10-CM | POA: Diagnosis not present

## 2020-06-01 DIAGNOSIS — M1712 Unilateral primary osteoarthritis, left knee: Secondary | ICD-10-CM | POA: Insufficient documentation

## 2020-06-01 DIAGNOSIS — M25561 Pain in right knee: Secondary | ICD-10-CM | POA: Diagnosis present

## 2020-06-01 DIAGNOSIS — I440 Atrioventricular block, first degree: Secondary | ICD-10-CM | POA: Diagnosis not present

## 2020-06-01 DIAGNOSIS — Z6841 Body Mass Index (BMI) 40.0 and over, adult: Secondary | ICD-10-CM | POA: Diagnosis not present

## 2020-06-01 DIAGNOSIS — R0602 Shortness of breath: Secondary | ICD-10-CM | POA: Diagnosis not present

## 2020-06-01 LAB — CBC WITH DIFFERENTIAL/PLATELET
Abs Immature Granulocytes: 0.02 10*3/uL (ref 0.00–0.07)
Basophils Absolute: 0.1 10*3/uL (ref 0.0–0.1)
Basophils Relative: 1 %
Eosinophils Absolute: 0.2 10*3/uL (ref 0.0–0.5)
Eosinophils Relative: 3 %
HCT: 36.7 % — ABNORMAL LOW (ref 39.0–52.0)
Hemoglobin: 12 g/dL — ABNORMAL LOW (ref 13.0–17.0)
Immature Granulocytes: 0 %
Lymphocytes Relative: 35 %
Lymphs Abs: 2 10*3/uL (ref 0.7–4.0)
MCH: 25.4 pg — ABNORMAL LOW (ref 26.0–34.0)
MCHC: 32.7 g/dL (ref 30.0–36.0)
MCV: 77.6 fL — ABNORMAL LOW (ref 80.0–100.0)
Monocytes Absolute: 0.5 10*3/uL (ref 0.1–1.0)
Monocytes Relative: 9 %
Neutro Abs: 2.9 10*3/uL (ref 1.7–7.7)
Neutrophils Relative %: 52 %
Platelets: UNDETERMINED 10*3/uL (ref 150–400)
RBC: 4.73 MIL/uL (ref 4.22–5.81)
RDW: 14.9 % (ref 11.5–15.5)
WBC: 5.7 10*3/uL (ref 4.0–10.5)
nRBC: 0 % (ref 0.0–0.2)

## 2020-06-01 LAB — BASIC METABOLIC PANEL
Anion gap: 9 (ref 5–15)
BUN: 13 mg/dL (ref 8–23)
CO2: 26 mmol/L (ref 22–32)
Calcium: 11 mg/dL — ABNORMAL HIGH (ref 8.9–10.3)
Chloride: 101 mmol/L (ref 98–111)
Creatinine, Ser: 1.17 mg/dL (ref 0.61–1.24)
GFR, Estimated: >60 mL/min (ref >60)
Glucose, Bld: 107 mg/dL — ABNORMAL HIGH (ref 70–99)
Potassium: 3.5 mmol/L (ref 3.5–5.1)
Sodium: 136 mmol/L (ref 135–145)

## 2020-06-01 MED ORDER — AMLODIPINE BESYLATE 5 MG PO TABS
10.0000 mg | ORAL_TABLET | Freq: Once | ORAL | Status: AC
  Administered 2020-06-01: 10 mg via ORAL
  Filled 2020-06-01: qty 2

## 2020-06-01 MED ORDER — CARVEDILOL 12.5 MG PO TABS
25.0000 mg | ORAL_TABLET | Freq: Once | ORAL | Status: AC
  Administered 2020-06-01: 25 mg via ORAL
  Filled 2020-06-01: qty 2

## 2020-06-01 MED ORDER — HYDROCODONE-ACETAMINOPHEN 5-325 MG PO TABS
1.0000 | ORAL_TABLET | Freq: Once | ORAL | Status: AC
  Administered 2020-06-01: 1 via ORAL
  Filled 2020-06-01: qty 1

## 2020-06-01 MED ORDER — PREDNISONE 20 MG PO TABS: 20.0000 mg | ORAL_TABLET | Freq: Every day | ORAL | 0 refills | Status: DC

## 2020-06-01 MED ORDER — LOSARTAN POTASSIUM 25 MG PO TABS
100.0000 mg | ORAL_TABLET | Freq: Once | ORAL | Status: AC
  Administered 2020-06-01: 100 mg via ORAL
  Filled 2020-06-01: qty 4

## 2020-06-01 MED ORDER — FUROSEMIDE 40 MG PO TABS
40.0000 mg | ORAL_TABLET | Freq: Once | ORAL | Status: AC
  Administered 2020-06-01: 40 mg via ORAL
  Filled 2020-06-01: qty 1

## 2020-06-01 MED FILL — TRULICITY 0.75 MG/0.5 ML PE: 0.75 | 30 days supply | Qty: 2 | Fill #3

## 2020-06-01 MED FILL — predniSONE 20 MG TABS: 20 | 5 days supply | Qty: 5 | Fill #0

## 2020-06-01 NOTE — ED Notes (Signed)
Patient transported to X-ray 

## 2020-06-01 NOTE — Discharge Instructions (Signed)
Use the braces to help when you are walking.  Elevated your legs when you are resting.  Take tramadol as needed but also can take tylenol.

## 2020-06-01 NOTE — ED Provider Notes (Signed)
[facility] DEPT Provider Note   CSN: [id number] Arrival date & time: 06/01/20  0908     History Chief Complaint  Patient presents with  . Knee Pain    Austin Mercer. is a 70 y.o. male.  The history is provided by the patient.  Knee Pain Location:  Knee Time since incident:  1 week Injury: no (did slide out of bed last week and had to crawl to get back in)   Knee location:  L knee and R knee Pain details:    Quality:  Aching and throbbing   Radiates to:  Does not radiate   Severity:  Moderate   Onset quality:  Gradual   Duration:  1 week   Timing:  Constant   Progression:  Waxing and waning Chronicity:  Recurrent Prior injury to area:  No Relieved by:  Rest Worsened by:  Bearing weight Ineffective treatments:  NSAIDs (tramadol only helps a little) Associated symptoms: decreased ROM, stiffness and swelling   Associated symptoms: no back pain, no fever and no neck pain   Associated symptoms comment:  Both knees are swollen. Has noted a little swelling in the feet but urinating frequently and still taking his diuretic.  Sugars have been controlled.  No SOB or CP. Risk factors: obesity   Risk factors comment:  DM, HTN, CHF      Past Medical History:  Diagnosis Date  . Hypertension   . Hypertensive urgency 03/04/2020    Patient Active Problem List   Diagnosis Date Noted  . Toenail fungus 04/21/2020  . Chronic systolic heart failure ([place]) 03/11/2020  . First degree AV block 03/04/2020  . PVC (premature ventricular contraction) 03/04/2020  . Depression 03/04/2020  . History of alcohol use disorder 04/26/2018  . 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 Positive for macroalbuminuria 01/27/2018  . Hypercalcemia 01/24/2018  . Controlled type 2 diabetes mellitus without complication, without long-term current use of insulin ([place]) 11/23/2017  . Essential hypertension 11/23/2017  . Primary osteoarthritis of right knee  11/23/2017  . Class 3 severe obesity due to excess calories with serious comorbidity and body mass index (BMI) of 45.0 to 49.9 in adult ([place]) 11/23/2017  . Age-related cataract of right eye 11/23/2017  . Hepatitis C virus infection cured after antiviral drug therapy 11/23/2017  . Chronic, continuous use of opioids 11/23/2017    Past Surgical History:  Procedure Laterality Date  . ANKLE FRACTURE SURGERY         Family History  Problem Relation Age of Onset  . Diabetes Father     Social History   Tobacco Use  . Smoking status: Former Smoker    Quit date: 1973    Years since quitting: 49.1  . Smokeless tobacco: Never Used  Vaping Use  . Vaping Use: Never used  Substance Use Topics  . Alcohol use: Yes    Comment: Beer, Red wine  . Drug use: Not Currently    Home Medications Prior to Admission medications   Medication Sig Start Date End Date Taking? Authorizing Provider  Accu-Chek Softclix Lancets lancets Use as directed to test blood sugar once daily. DX: E11.9 03/04/20   Ladell Pier, MD  amLODipine (NORVASC) 10 MG tablet Take 1 tablet (10 mg total) by mouth daily. 03/04/20   [name], MD  atorvastatin (LIPITOR) 10 MG tablet Take 1 tablet (10 mg total) by mouth daily. 03/04/20   [name], MD  Blood Glucose Monitoring Suppl (  ACCU-CHEK GUIDE) w/Device KIT 1 each by Does not apply route daily. Use as directed to test blood sugar once daily. DX: E11.9 04/21/20   Ladell Pier, MD  Blood Pressure Monitoring (BLOOD PRESSURE MONITOR 7) DEVI Use to monitor blood pressure. 03/04/20   [name], MD  carvedilol (COREG) 25 MG tablet Take 1 tablet (25 mg total) by mouth 2 (two) times daily with a meal. Take 1 tablet p.o. twice a day 05/27/20   [name], MD  diclofenac Sodium (VOLTAREN) 1 % GEL Apply 2 g topically 4 (four) times daily. To knees 03/04/20   [name], MD  Dulaglutide (TRULICITY) 2.67 TI/4.5YK SOPN Inject 0.75 mg into the skin  once a week. 03/04/20   [name], MD  furosemide (LASIX) 40 MG tablet Take 1 tablet (40 mg total) by mouth daily. 03/04/20   [name], MD  glucose blood (ACCU-CHEK GUIDE) test strip Use as directed to test blood sugar once daily. DX: E11.9 03/04/20   [name], MD  losartan (COZAAR) 100 MG tablet Take 1 tablet (100 mg total) by mouth daily. 03/04/20   [name], MD  metFORMIN (GLUCOPHAGE) 500 MG tablet Take 1 tablet (500 mg total) by mouth 2 (two) times daily with a meal. 03/04/20   [name], MD  potassium chloride (KLOR-CON) 8 MEQ tablet Take 2 tablets (16 mEq total) by mouth 2 (two) times daily. 03/04/20   [name], MD  tadalafil (ADCIRCA/CIALIS) 20 MG tablet 1 tab PO Q 1 hr prior to sex as needed.  Limit use to 1 tab/24 hr 04/26/18   Ladell Pier, MD  traMADol (ULTRAM) 50 MG tablet Take 1 tablet (50 mg total) by mouth every 12 (twelve) hours as needed. 04/21/20   [name], MD    Allergies    Latex  Review of Systems   Review of Systems  Constitutional: Negative for fever.  Musculoskeletal: Positive for stiffness. Negative for back pain and neck pain.  All other systems reviewed and are negative.   Physical Exam Updated Vital Signs BP (!) 185/117   Pulse (!) 104   Temp 99.3 F (37.4 C) (Oral)   Resp 17   Ht 6' 1"  (1.854 m)   SpO2 94%   BMI 42.22 kg/m   Physical Exam Vitals and nursing note reviewed.  Constitutional:      General: He is not in acute distress.    Appearance: Normal appearance. He is well-developed and well-nourished.  HENT:     Head: Normocephalic and atraumatic.     Mouth/Throat:     Mouth: Oropharynx is clear and moist. Mucous membranes are moist.  Eyes:     Extraocular Movements: EOM normal.     Conjunctiva/sclera: Conjunctivae normal.     Pupils: Pupils are equal, round, and reactive to light.  Cardiovascular:     Rate and Rhythm: Normal rate and regular rhythm.     Pulses: Normal pulses and  intact distal pulses.     Heart sounds: No murmur heard.   Pulmonary:     Effort: Pulmonary effort is normal. No respiratory distress.     Breath sounds: Normal breath sounds. No wheezing or rales.  Abdominal:     General: There is no distension.     Palpations: Abdomen is soft.     Tenderness: There is no abdominal tenderness. There is no guarding or rebound.  Musculoskeletal:        General:  Normal range of motion.     Cervical back: Normal range of motion and neck supple.     Right knee: Swelling and effusion present. No erythema. Normal range of motion. Tenderness present over the medial joint line and lateral joint line.     Left knee: Swelling and effusion present. No erythema. Normal range of motion. Tenderness present over the medial joint line and lateral joint line.     Right lower leg: Edema present.     Left lower leg: Edema present.     Comments: 1+ pitting edema of bilateral feet and ankles.  No warmth over the knees bilaterally or erythema with full ROM.  Pt is able to walk but takes short steps with slight limp  Skin:    General: Skin is warm and dry.     Findings: No erythema or rash.  Neurological:     Mental Status: He is alert and oriented to person, place, and time. Mental status is at baseline.  Psychiatric:        Mood and Affect: Mood and affect and mood normal.        Behavior: Behavior normal.        Thought Content: Thought content normal.     ED Results / Procedures / Treatments   Labs (all labs ordered are listed, but only abnormal results are displayed) Labs Reviewed  CBC WITH DIFFERENTIAL/PLATELET - Abnormal; Notable for the following components:      Result Value   Hemoglobin 12.0 (*)    HCT 36.7 (*)    MCV 77.6 (*)    MCH 25.4 (*)    All other components within normal limits  BASIC METABOLIC PANEL - Abnormal; Notable for the following components:   Glucose, Bld 107 (*)    Calcium 11.0 (*)    All other components within normal limits     EKG None  Radiology DG Knee Complete 4 Views Left  Result Date: 06/01/2020 CLINICAL DATA:  Knee pain and swelling EXAM: LEFT KNEE - COMPLETE 4+ VIEW COMPARISON:  None. FINDINGS: Degenerative marginal spurring and chondrocalcinosis. No detected joint space narrowing. Geographic sclerosis in the medullary space of the upper tibia. Small joint effusion. IMPRESSION: 1. No acute finding. 2. Mild degenerative spurring. 3. 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 Electronically Signed   By: Monte Fantasia M.D.   On: 06/01/2020 11:03   DG Knee Complete 4 Views Right  Result Date: 06/01/2020 CLINICAL DATA:  Bilateral knee pain EXAM: RIGHT KNEE - COMPLETE 4+ VIEW COMPARISON:  10/05/2009 FINDINGS: Tricompartmental marginal spurring with medial compartment narrowing. Knee joint effusion. No acute fracture or erosion. Nonspecific subcutaneous reticulation. IMPRESSION: 1. Osteoarthritis with medial compartment narrowing. 2. Joint effusion. Electronically Signed   By: Monte Fantasia M.D.   On: 06/01/2020 11:02    Procedures Procedures   Medications Ordered in ED Medications  HYDROcodone-acetaminophen (NORCO/VICODIN) 5-325 MG per tablet 1 tablet (has no administration in time range)    ED Course  I have reviewed the triage vital signs and the nursing notes.  Pertinent labs & imaging results that were available during my care of the patient were reviewed by me and considered in my medical decision making (see chart for details).    MDM Rules/Calculators/A&P                          Elderly male with multiple medical problems presenting today with bilateral knee pain.  Right knee pain started  approximately 1 week ago and has been persistent but now the left knee is hurting as well.  Both knees on exam today are swollen with some effusion but do not have any erythema or palpable warmth concerning for gout or septic arthritis.  Patient systemically has been feeling well it is just painful for him to  get up and move around.  He is taken tramadol at home as well as some Tylenol and ibuprofen reports some mild improvement but it is not going away.  He would like medication for arthritis.  He has never had any joint surgeries.  He does have a history of CHF and does take 3 medications for his blood pressure and a diuretic and reports he continues to urinate well.  He is denying any chest pain or shortness of breath but does have some mild edema in his lower extremities.  Low suspicion that he is having a CHF exacerbation today.  Will check labs as he has not had them checked since early December to ensure renal function is okay and he can tolerate medication for arthritis pain.  Plain x-rays pending.  Also patient has not taken any of his blood pressure medications this morning and his home meds were ordered.  Blood pressure today is 171/99.  11:27 AM Labs at baseline with normal renal function.  Bilateral films show osteoarthritis with joint effusion.  Will start pt on prednisone for the next 5 and have him f/u with ortho in the future.  MDM Number of Diagnoses or Management Options   Amount and/or Complexity of Data Reviewed Clinical lab tests: reviewed and ordered Tests in the radiology section of CPT: ordered and reviewed Independent visualization of images, tracings, or specimens: yes    Final Clinical Impression(s) / ED Diagnoses Final diagnoses:  Primary osteoarthritis of both knees    Rx / DC Orders ED Discharge Orders         Ordered    predniSONE (DELTASONE) 20 MG tablet  Daily        06/01/20 1139           Blanchie Dessert, MD 06/01/20 1142

## 2020-06-01 NOTE — ED Triage Notes (Signed)
Patient BIB [facility] c/o bilateral knee pain after a fall 2 weeks ago.  Patient has had some right knee swelling and pain since fall.  Patient has been taking tramadol for pain. Patient walking and standing in room, just a bit slow moving.  Patient denies chest pain or SOB.

## 2020-06-01 NOTE — ED Notes (Signed)
Ortho tech at bedside to place patient's knee brace/sleeve.

## 2020-06-03 ENCOUNTER — Telehealth: Payer: Self-pay

## 2020-06-03 NOTE — Telephone Encounter (Signed)
Referral received for home PT.  Attempted to call [facility], [place] with answering service said that the office was closed. She could page the nurse on call ;but this CM will call back when the office is open.  Call placed to patient to inform him of PT referral. Message left with call back requested to this CM

## 2020-06-07 NOTE — Telephone Encounter (Signed)
Call placed to [facility] manager/[facility], and provided orders for home PT to eval and treat. She said that they have a number of patients waiting for PT and they will see him as soon as possible

## 2020-06-08 DIAGNOSIS — I10 Essential (primary) hypertension: Secondary | ICD-10-CM | POA: Diagnosis not present

## 2020-06-08 DIAGNOSIS — Z7984 Long term (current) use of oral hypoglycemic drugs: Secondary | ICD-10-CM | POA: Diagnosis not present

## 2020-06-08 DIAGNOSIS — M1711 Unilateral primary osteoarthritis, right knee: Secondary | ICD-10-CM | POA: Diagnosis not present

## 2020-06-08 DIAGNOSIS — E119 Type 2 diabetes mellitus without complications: Secondary | ICD-10-CM | POA: Diagnosis not present

## 2020-06-08 DIAGNOSIS — Z6841 Body Mass Index (BMI) 40.0 and over, adult: Secondary | ICD-10-CM | POA: Diagnosis not present

## 2020-06-08 DIAGNOSIS — I440 Atrioventricular block, first degree: Secondary | ICD-10-CM | POA: Diagnosis not present

## 2020-06-08 DIAGNOSIS — E669 Obesity, unspecified: Secondary | ICD-10-CM | POA: Diagnosis not present

## 2020-06-08 DIAGNOSIS — F331 Major depressive disorder, recurrent, moderate: Secondary | ICD-10-CM | POA: Diagnosis not present

## 2020-06-08 DIAGNOSIS — Z87891 Personal history of nicotine dependence: Secondary | ICD-10-CM | POA: Diagnosis not present

## 2020-06-11 DIAGNOSIS — M1711 Unilateral primary osteoarthritis, right knee: Secondary | ICD-10-CM | POA: Diagnosis not present

## 2020-06-11 DIAGNOSIS — E119 Type 2 diabetes mellitus without complications: Secondary | ICD-10-CM | POA: Diagnosis not present

## 2020-06-11 DIAGNOSIS — F331 Major depressive disorder, recurrent, moderate: Secondary | ICD-10-CM | POA: Diagnosis not present

## 2020-06-11 DIAGNOSIS — I10 Essential (primary) hypertension: Secondary | ICD-10-CM | POA: Diagnosis not present

## 2020-06-11 DIAGNOSIS — Z7984 Long term (current) use of oral hypoglycemic drugs: Secondary | ICD-10-CM | POA: Diagnosis not present

## 2020-06-11 DIAGNOSIS — I440 Atrioventricular block, first degree: Secondary | ICD-10-CM | POA: Diagnosis not present

## 2020-06-11 DIAGNOSIS — Z6841 Body Mass Index (BMI) 40.0 and over, adult: Secondary | ICD-10-CM | POA: Diagnosis not present

## 2020-06-11 DIAGNOSIS — E669 Obesity, unspecified: Secondary | ICD-10-CM | POA: Diagnosis not present

## 2020-06-11 DIAGNOSIS — Z87891 Personal history of nicotine dependence: Secondary | ICD-10-CM | POA: Diagnosis not present

## 2020-06-13 DIAGNOSIS — Z6841 Body Mass Index (BMI) 40.0 and over, adult: Secondary | ICD-10-CM | POA: Diagnosis not present

## 2020-06-13 DIAGNOSIS — Z7984 Long term (current) use of oral hypoglycemic drugs: Secondary | ICD-10-CM | POA: Diagnosis not present

## 2020-06-13 DIAGNOSIS — F331 Major depressive disorder, recurrent, moderate: Secondary | ICD-10-CM | POA: Diagnosis not present

## 2020-06-13 DIAGNOSIS — E119 Type 2 diabetes mellitus without complications: Secondary | ICD-10-CM | POA: Diagnosis not present

## 2020-06-13 DIAGNOSIS — I10 Essential (primary) hypertension: Secondary | ICD-10-CM | POA: Diagnosis not present

## 2020-06-13 DIAGNOSIS — E669 Obesity, unspecified: Secondary | ICD-10-CM | POA: Diagnosis not present

## 2020-06-13 DIAGNOSIS — I440 Atrioventricular block, first degree: Secondary | ICD-10-CM | POA: Diagnosis not present

## 2020-06-13 DIAGNOSIS — M1711 Unilateral primary osteoarthritis, right knee: Secondary | ICD-10-CM | POA: Diagnosis not present

## 2020-06-13 DIAGNOSIS — Z87891 Personal history of nicotine dependence: Secondary | ICD-10-CM | POA: Diagnosis not present

## 2020-06-14 ENCOUNTER — Other Ambulatory Visit: Payer: Self-pay | Admitting: Family Medicine

## 2020-06-14 ENCOUNTER — Telehealth: Payer: Self-pay | Admitting: Critical Care Medicine

## 2020-06-14 DIAGNOSIS — M1711 Unilateral primary osteoarthritis, right knee: Secondary | ICD-10-CM

## 2020-06-14 NOTE — Telephone Encounter (Signed)
Requested medication (s) are due for refill today: yes  Requested medication (s) are on the active medication list: yes  Last refill: 05/20/20  Future visit scheduled: yes  Notes to clinic: not delegated    Requested Prescriptions  Pending Prescriptions Disp Refills   traMADol (ULTRAM) 50 MG tablet [Pharmacy Med Name: traMADol HCL 50 MG TABS 50 Tablet] 60 tablet 0    Sig: Take 1 tablet (50 mg total) by mouth every 12 (twelve) hours as needed.      Not Delegated - Analgesics:  Opioid Agonists Failed - 06/14/2020 11:12 AM      Failed - This refill cannot be delegated      Failed - Urine Drug Screen completed in last 360 days      Passed - Valid encounter within last 6 months    Recent Outpatient Visits           1 month ago Controlled type 2 diabetes mellitus without complication, without long-term current use of insulin [facility])   [place] [facility] [place], Jeannett Senior L, RPH-CPP   1 month ago Controlled type 2 diabetes mellitus without complication, without long-term current use of insulin [facility])   [facility] [place], Jeannett Senior L, RPH-CPP   1 month ago Controlled type 2 diabetes mellitus without complication, without long-term current use of insulin [facility])   [facility] Storm Frisk, MD   2 months ago Controlled type 2 diabetes mellitus without complication, without long-term current use of insulin [facility])   [place] [facility] [place], Jeannett Senior L, RPH-CPP   3 months ago Acute systolic heart failure [facility])   [place] [facility] [place], Kasandra Knudsen, [place]       Future Appointments             In 1 month Delford Field, Charlcie Cradle, MD [facility]

## 2020-06-14 NOTE — Telephone Encounter (Signed)
Home Health Verbal Orders - Caller/Agency: Sharol Harness / [facility] Callback Number: [phone] secure VM can be left  Requesting PT Frequency: 2xs a week for 2 weeks and 1x a week for 3 weeks

## 2020-06-15 ENCOUNTER — Other Ambulatory Visit: Payer: Self-pay | Admitting: Critical Care Medicine

## 2020-06-15 DIAGNOSIS — Z87891 Personal history of nicotine dependence: Secondary | ICD-10-CM | POA: Diagnosis not present

## 2020-06-15 DIAGNOSIS — E669 Obesity, unspecified: Secondary | ICD-10-CM | POA: Diagnosis not present

## 2020-06-15 DIAGNOSIS — M1711 Unilateral primary osteoarthritis, right knee: Secondary | ICD-10-CM | POA: Diagnosis not present

## 2020-06-15 DIAGNOSIS — Z6841 Body Mass Index (BMI) 40.0 and over, adult: Secondary | ICD-10-CM | POA: Diagnosis not present

## 2020-06-15 DIAGNOSIS — E119 Type 2 diabetes mellitus without complications: Secondary | ICD-10-CM | POA: Diagnosis not present

## 2020-06-15 DIAGNOSIS — Z7984 Long term (current) use of oral hypoglycemic drugs: Secondary | ICD-10-CM | POA: Diagnosis not present

## 2020-06-15 DIAGNOSIS — I440 Atrioventricular block, first degree: Secondary | ICD-10-CM | POA: Diagnosis not present

## 2020-06-15 DIAGNOSIS — I10 Essential (primary) hypertension: Secondary | ICD-10-CM | POA: Diagnosis not present

## 2020-06-15 DIAGNOSIS — F331 Major depressive disorder, recurrent, moderate: Secondary | ICD-10-CM | POA: Diagnosis not present

## 2020-06-15 NOTE — Telephone Encounter (Signed)
Verbal orders were given for pt. 

## 2020-06-21 DIAGNOSIS — Z6841 Body Mass Index (BMI) 40.0 and over, adult: Secondary | ICD-10-CM | POA: Diagnosis not present

## 2020-06-21 DIAGNOSIS — I10 Essential (primary) hypertension: Secondary | ICD-10-CM | POA: Diagnosis not present

## 2020-06-21 DIAGNOSIS — F331 Major depressive disorder, recurrent, moderate: Secondary | ICD-10-CM | POA: Diagnosis not present

## 2020-06-21 DIAGNOSIS — E119 Type 2 diabetes mellitus without complications: Secondary | ICD-10-CM | POA: Diagnosis not present

## 2020-06-21 DIAGNOSIS — E669 Obesity, unspecified: Secondary | ICD-10-CM | POA: Diagnosis not present

## 2020-06-21 DIAGNOSIS — M1711 Unilateral primary osteoarthritis, right knee: Secondary | ICD-10-CM | POA: Diagnosis not present

## 2020-06-21 DIAGNOSIS — Z87891 Personal history of nicotine dependence: Secondary | ICD-10-CM | POA: Diagnosis not present

## 2020-06-21 DIAGNOSIS — Z7984 Long term (current) use of oral hypoglycemic drugs: Secondary | ICD-10-CM | POA: Diagnosis not present

## 2020-06-21 DIAGNOSIS — I440 Atrioventricular block, first degree: Secondary | ICD-10-CM | POA: Diagnosis not present

## 2020-06-22 DIAGNOSIS — Z7984 Long term (current) use of oral hypoglycemic drugs: Secondary | ICD-10-CM | POA: Diagnosis not present

## 2020-06-22 DIAGNOSIS — I440 Atrioventricular block, first degree: Secondary | ICD-10-CM | POA: Diagnosis not present

## 2020-06-22 DIAGNOSIS — M1711 Unilateral primary osteoarthritis, right knee: Secondary | ICD-10-CM | POA: Diagnosis not present

## 2020-06-22 DIAGNOSIS — Z6841 Body Mass Index (BMI) 40.0 and over, adult: Secondary | ICD-10-CM | POA: Diagnosis not present

## 2020-06-22 DIAGNOSIS — Z87891 Personal history of nicotine dependence: Secondary | ICD-10-CM | POA: Diagnosis not present

## 2020-06-22 DIAGNOSIS — E119 Type 2 diabetes mellitus without complications: Secondary | ICD-10-CM | POA: Diagnosis not present

## 2020-06-22 DIAGNOSIS — E669 Obesity, unspecified: Secondary | ICD-10-CM | POA: Diagnosis not present

## 2020-06-22 DIAGNOSIS — F331 Major depressive disorder, recurrent, moderate: Secondary | ICD-10-CM | POA: Diagnosis not present

## 2020-06-22 DIAGNOSIS — I10 Essential (primary) hypertension: Secondary | ICD-10-CM | POA: Diagnosis not present

## 2020-06-22 MED FILL — FUROSEMIDE 40 MG TAB: 40 | 90 days supply | Qty: 90 | Fill #1

## 2020-06-22 MED FILL — AMLODIPINE BESYLATE 10 MG T: 10 | 90 days supply | Qty: 90 | Fill #1

## 2020-06-22 MED FILL — LOSARTAN POTASSIUM 100 MG T: 100 | 90 days supply | Qty: 90 | Fill #1

## 2020-06-25 DIAGNOSIS — Z7984 Long term (current) use of oral hypoglycemic drugs: Secondary | ICD-10-CM | POA: Diagnosis not present

## 2020-06-25 DIAGNOSIS — Z87891 Personal history of nicotine dependence: Secondary | ICD-10-CM | POA: Diagnosis not present

## 2020-06-25 DIAGNOSIS — F331 Major depressive disorder, recurrent, moderate: Secondary | ICD-10-CM | POA: Diagnosis not present

## 2020-06-25 DIAGNOSIS — M1711 Unilateral primary osteoarthritis, right knee: Secondary | ICD-10-CM | POA: Diagnosis not present

## 2020-06-25 DIAGNOSIS — I10 Essential (primary) hypertension: Secondary | ICD-10-CM | POA: Diagnosis not present

## 2020-06-25 DIAGNOSIS — E119 Type 2 diabetes mellitus without complications: Secondary | ICD-10-CM | POA: Diagnosis not present

## 2020-06-25 DIAGNOSIS — I440 Atrioventricular block, first degree: Secondary | ICD-10-CM | POA: Diagnosis not present

## 2020-06-25 DIAGNOSIS — E669 Obesity, unspecified: Secondary | ICD-10-CM | POA: Diagnosis not present

## 2020-06-25 DIAGNOSIS — Z6841 Body Mass Index (BMI) 40.0 and over, adult: Secondary | ICD-10-CM | POA: Diagnosis not present

## 2020-06-27 DIAGNOSIS — H9201 Otalgia, right ear: Secondary | ICD-10-CM | POA: Insufficient documentation

## 2020-06-27 DIAGNOSIS — Z5321 Procedure and treatment not carried out due to patient leaving prior to being seen by health care provider: Secondary | ICD-10-CM | POA: Diagnosis not present

## 2020-06-28 ENCOUNTER — Other Ambulatory Visit: Payer: Self-pay

## 2020-06-28 ENCOUNTER — Emergency Department (HOSPITAL_COMMUNITY)
Admission: EM | Admit: 2020-06-28 | Discharge: 2020-06-28 | Disposition: A | Discharge status: Home / Self Care | Payer: No Typology Code available for payment source | Attending: Emergency Medicine | Admitting: Emergency Medicine

## 2020-06-28 ENCOUNTER — Other Ambulatory Visit: Payer: Self-pay | Admitting: Critical Care Medicine

## 2020-06-28 ENCOUNTER — Encounter (HOSPITAL_COMMUNITY): Payer: Self-pay

## 2020-06-28 DIAGNOSIS — M1711 Unilateral primary osteoarthritis, right knee: Secondary | ICD-10-CM

## 2020-06-28 HISTORY — DX: Type 2 diabetes mellitus without complications: E11.9

## 2020-06-28 MED ORDER — TRAMADOL HCL 50 MG PO TABS: 50.0000 mg | ORAL_TABLET | Freq: Two times a day (BID) | ORAL | 0 refills | Status: DC | PRN

## 2020-06-28 MED FILL — TRULICITY 0.75 MG/0.5 ML PE: 0.75 | 30 days supply | Qty: 2 | Fill #4

## 2020-06-28 NOTE — Telephone Encounter (Signed)
Copied from CRM [id number]. Topic: Quick Communication - Rx Refill/Question >> Jun 28, 2020  2:46 PM Gaetana Michaelis A wrote: Medication: traMADol (ULTRAM) 50 MG tablet   Has the patient contacted their pharmacy? Yes. Patient's friend was unable to successfully communicate with the pharmacy.   Preferred Pharmacy (with phone number or street name): [facility] - [place], [place] - [address]  Phone:  [phone]  Agent: Please be advised that RX refills may take up to 3 business days. We ask that you follow-up with your pharmacy.

## 2020-06-28 NOTE — Telephone Encounter (Signed)
Requested medication (s) are due for refill today: no  Requested medication (s) are on the active medication list: yes  Last refill: 06/15/2020  Future visit scheduled:  yes   Notes to clinic:  this refill cannot be delegated    Requested Prescriptions  Pending Prescriptions Disp Refills   traMADol (ULTRAM) 50 MG tablet 60 tablet 0    Sig: Take 1 tablet (50 mg total) by mouth every 12 (twelve) hours as needed.      There is no refill protocol information for this order

## 2020-06-28 NOTE — ED Triage Notes (Signed)
Pt states that he was asleep, rolled over, and noticed that he could not hear out of right ear. States that he can hear now.

## 2020-06-29 DIAGNOSIS — I10 Essential (primary) hypertension: Secondary | ICD-10-CM | POA: Diagnosis not present

## 2020-06-29 DIAGNOSIS — Z87891 Personal history of nicotine dependence: Secondary | ICD-10-CM | POA: Diagnosis not present

## 2020-06-29 DIAGNOSIS — Z6841 Body Mass Index (BMI) 40.0 and over, adult: Secondary | ICD-10-CM | POA: Diagnosis not present

## 2020-06-29 DIAGNOSIS — M1711 Unilateral primary osteoarthritis, right knee: Secondary | ICD-10-CM | POA: Diagnosis not present

## 2020-06-29 DIAGNOSIS — I440 Atrioventricular block, first degree: Secondary | ICD-10-CM | POA: Diagnosis not present

## 2020-06-29 DIAGNOSIS — E119 Type 2 diabetes mellitus without complications: Secondary | ICD-10-CM | POA: Diagnosis not present

## 2020-06-29 DIAGNOSIS — Z7984 Long term (current) use of oral hypoglycemic drugs: Secondary | ICD-10-CM | POA: Diagnosis not present

## 2020-06-29 DIAGNOSIS — E669 Obesity, unspecified: Secondary | ICD-10-CM | POA: Diagnosis not present

## 2020-06-29 DIAGNOSIS — F331 Major depressive disorder, recurrent, moderate: Secondary | ICD-10-CM | POA: Diagnosis not present

## 2020-07-01 DIAGNOSIS — I440 Atrioventricular block, first degree: Secondary | ICD-10-CM | POA: Diagnosis not present

## 2020-07-01 DIAGNOSIS — E669 Obesity, unspecified: Secondary | ICD-10-CM | POA: Diagnosis not present

## 2020-07-01 DIAGNOSIS — Z6841 Body Mass Index (BMI) 40.0 and over, adult: Secondary | ICD-10-CM | POA: Diagnosis not present

## 2020-07-01 DIAGNOSIS — I10 Essential (primary) hypertension: Secondary | ICD-10-CM | POA: Diagnosis not present

## 2020-07-01 DIAGNOSIS — Z87891 Personal history of nicotine dependence: Secondary | ICD-10-CM | POA: Diagnosis not present

## 2020-07-01 DIAGNOSIS — Z7984 Long term (current) use of oral hypoglycemic drugs: Secondary | ICD-10-CM | POA: Diagnosis not present

## 2020-07-01 DIAGNOSIS — M1711 Unilateral primary osteoarthritis, right knee: Secondary | ICD-10-CM | POA: Diagnosis not present

## 2020-07-01 DIAGNOSIS — F331 Major depressive disorder, recurrent, moderate: Secondary | ICD-10-CM | POA: Diagnosis not present

## 2020-07-01 DIAGNOSIS — E119 Type 2 diabetes mellitus without complications: Secondary | ICD-10-CM | POA: Diagnosis not present

## 2020-07-07 DIAGNOSIS — I10 Essential (primary) hypertension: Secondary | ICD-10-CM | POA: Diagnosis not present

## 2020-07-07 DIAGNOSIS — Z87891 Personal history of nicotine dependence: Secondary | ICD-10-CM | POA: Diagnosis not present

## 2020-07-07 DIAGNOSIS — Z6841 Body Mass Index (BMI) 40.0 and over, adult: Secondary | ICD-10-CM | POA: Diagnosis not present

## 2020-07-07 DIAGNOSIS — Z7984 Long term (current) use of oral hypoglycemic drugs: Secondary | ICD-10-CM | POA: Diagnosis not present

## 2020-07-07 DIAGNOSIS — M1711 Unilateral primary osteoarthritis, right knee: Secondary | ICD-10-CM | POA: Diagnosis not present

## 2020-07-07 DIAGNOSIS — I440 Atrioventricular block, first degree: Secondary | ICD-10-CM | POA: Diagnosis not present

## 2020-07-07 DIAGNOSIS — E119 Type 2 diabetes mellitus without complications: Secondary | ICD-10-CM | POA: Diagnosis not present

## 2020-07-07 DIAGNOSIS — E669 Obesity, unspecified: Secondary | ICD-10-CM | POA: Diagnosis not present

## 2020-07-07 DIAGNOSIS — F331 Major depressive disorder, recurrent, moderate: Secondary | ICD-10-CM | POA: Diagnosis not present

## 2020-07-20 ENCOUNTER — Ambulatory Visit: Payer: Medicare HMO | Admitting: Critical Care Medicine

## 2020-07-21 ENCOUNTER — Other Ambulatory Visit: Payer: Self-pay | Admitting: Critical Care Medicine

## 2020-07-21 ENCOUNTER — Other Ambulatory Visit: Payer: Self-pay

## 2020-07-21 MED ORDER — TRULICITY 0.75 MG/0.5ML ~~LOC~~ SOAJ
0.7500 mg | SUBCUTANEOUS | 4 refills | Status: DC
  Filled 2020-07-21: qty 2, 28d supply, fill #0
  Filled 2020-08-25: qty 2, 28d supply, fill #1

## 2020-07-21 MED FILL — Tramadol HCl Tab 50 MG: ORAL | 30 days supply | Qty: 60 | Fill #0 | Status: AC

## 2020-07-23 ENCOUNTER — Other Ambulatory Visit: Payer: Self-pay

## 2020-07-30 ENCOUNTER — Other Ambulatory Visit: Payer: Self-pay

## 2020-07-30 ENCOUNTER — Ambulatory Visit: Payer: No Typology Code available for payment source | Attending: Critical Care Medicine | Admitting: Pharmacist

## 2020-07-30 DIAGNOSIS — Z79899 Other long term (current) drug therapy: Secondary | ICD-10-CM | POA: Diagnosis not present

## 2020-07-30 NOTE — Progress Notes (Signed)
Patient presents today with medications and a pill box requesting for me to fill his pill box. He has all of his medications with him that we have in his profile. There is some concern for missed doses as all of his "PM" boxes still have medication in them. I encouraged compliance.   Time spent counseling: 10 minutes  Follow-up: next week  Butch Penny, PharmD, [place], CPP Clinical Pharmacist [facility] & [place] [facility] [phone]

## 2020-08-06 ENCOUNTER — Encounter: Payer: Self-pay | Admitting: Pharmacist

## 2020-08-06 ENCOUNTER — Ambulatory Visit: Payer: No Typology Code available for payment source | Attending: Critical Care Medicine | Admitting: Pharmacist

## 2020-08-06 ENCOUNTER — Other Ambulatory Visit: Payer: Self-pay

## 2020-08-06 DIAGNOSIS — Z79899 Other long term (current) drug therapy: Secondary | ICD-10-CM | POA: Diagnosis not present

## 2020-08-06 NOTE — Progress Notes (Signed)
Patient presents today with medications and a pill box requesting for me to fill his pill box. He has all of his medications with him that we have in his profile. There is some concern for missed doses as all of his "PM" boxes still have medication in them. I encouraged compliance.   Time spent counseling: 10 minutes  Follow-up: next week  [name], PharmD, BCACP, CPP Clinical Pharmacist [facility] [phone]  

## 2020-08-16 ENCOUNTER — Ambulatory Visit: Payer: No Typology Code available for payment source | Attending: Critical Care Medicine | Admitting: Pharmacist

## 2020-08-16 ENCOUNTER — Other Ambulatory Visit: Payer: Self-pay

## 2020-08-16 DIAGNOSIS — Z79899 Other long term (current) drug therapy: Secondary | ICD-10-CM

## 2020-08-16 NOTE — Progress Notes (Signed)
Patient presents today with medications and a pill box requesting for me to fill his pill box. He has all of his medications with him that we have in his profile. There is some concern for missed doses as all of his "PM" boxes still have medication in them. I encouraged compliance.   Time spent counseling: 10 minutes  Follow-up: next week  Butch Penny, PharmD, [place], CPP Clinical Pharmacist [facility] & [facility] [phone]

## 2020-08-18 ENCOUNTER — Telehealth: Payer: Self-pay | Admitting: Critical Care Medicine

## 2020-08-18 ENCOUNTER — Other Ambulatory Visit: Payer: Self-pay

## 2020-08-18 ENCOUNTER — Other Ambulatory Visit: Payer: Self-pay | Admitting: Pharmacist

## 2020-08-18 DIAGNOSIS — N529 Male erectile dysfunction, unspecified: Secondary | ICD-10-CM

## 2020-08-18 MED ORDER — TADALAFIL 20 MG PO TABS
ORAL_TABLET | ORAL | 1 refills | Status: DC
  Filled 2020-08-18: qty 10, fill #0

## 2020-08-18 MED ORDER — TADALAFIL 20 MG PO TABS: ORAL_TABLET | ORAL | 1 refills | Status: DC

## 2020-08-18 MED FILL — Potassium Chloride Tab ER 8 mEq (600 MG): ORAL | 30 days supply | Qty: 120 | Fill #0 | Status: AC

## 2020-08-18 MED FILL — Carvedilol Tab 12.5 MG: ORAL | 30 days supply | Qty: 60 | Fill #0 | Status: AC

## 2020-08-18 NOTE — Telephone Encounter (Signed)
Pt would like a refill on this tadalafil (CIALIS) 20 MG tablet. Please follow up with pt regarding issue.

## 2020-08-18 NOTE — Telephone Encounter (Signed)
rx sent earlier today

## 2020-08-25 ENCOUNTER — Ambulatory Visit: Payer: No Typology Code available for payment source | Attending: Critical Care Medicine | Admitting: Pharmacist

## 2020-08-25 ENCOUNTER — Other Ambulatory Visit: Payer: Self-pay

## 2020-08-25 ENCOUNTER — Other Ambulatory Visit: Payer: Self-pay | Admitting: Pharmacist

## 2020-08-25 DIAGNOSIS — Z79899 Other long term (current) drug therapy: Secondary | ICD-10-CM | POA: Diagnosis not present

## 2020-08-25 MED FILL — Carvedilol Tab 25 MG: ORAL | 90 days supply | Qty: 180 | Fill #0 | Status: AC

## 2020-08-25 MED FILL — Metformin HCl Tab 500 MG: ORAL | 90 days supply | Qty: 180 | Fill #0 | Status: AC

## 2020-08-25 NOTE — Progress Notes (Signed)
Patient presents today with medications and a pill box requesting for me to fill his pill box. He has all of his medications with him that we have in his profile. There is some concern for missed doses as all of his "PM" boxes still have medication in them. I encouraged compliance.   Time spent counseling: 10 minutes  Follow-up: as needed  Butch Penny, PharmD, [place], CPP Clinical Pharmacist [facility] & [facility] [phone]

## 2020-09-03 ENCOUNTER — Other Ambulatory Visit: Payer: Self-pay | Admitting: Critical Care Medicine

## 2020-09-03 ENCOUNTER — Other Ambulatory Visit: Payer: Self-pay

## 2020-09-03 NOTE — Telephone Encounter (Signed)
Requested medication (s) are due for refill today:yes  Requested medication (s) are on the active medication list: yes  Last refill: 07/21/20  Future visit scheduled: yes  Notes to clinic:  not delegated   Requested Prescriptions  Pending Prescriptions Disp Refills   traMADol (ULTRAM) 50 MG tablet 60 tablet 0    Sig: TAKE 1 TABLET (50 MG TOTAL) BY MOUTH EVERY 12 (TWELVE) HOURS AS NEEDED.      Not Delegated - Analgesics:  Opioid Agonists Failed - 09/03/2020  1:36 PM      Failed - This refill cannot be delegated      Failed - Urine Drug Screen completed in last 360 days      Passed - Valid encounter within last 6 months    Recent Outpatient Visits           1 week ago Medication management   [facility] Lois Huxley, Cornelius Moras, RPH-CPP   2 weeks ago Medication management   [facility] Lois Huxley, Cornelius Moras, RPH-CPP   4 weeks ago Medication management   [facility] Drucilla Chalet, RPH-CPP   1 month ago Medication management   [facility] Drucilla Chalet, RPH-CPP   4 months ago Controlled type 2 diabetes mellitus without complication, without long-term current use of insulin [facility])   [facility] Lois Huxley, Cornelius Moras, RPH-CPP       Future Appointments             In 6 days Storm Frisk, MD [facility]

## 2020-09-06 ENCOUNTER — Other Ambulatory Visit: Payer: Self-pay

## 2020-09-06 MED ORDER — TRAMADOL HCL 50 MG PO TABS
ORAL_TABLET | Freq: Two times a day (BID) | ORAL | 0 refills | Status: DC | PRN
  Filled 2020-09-06: qty 60, 30d supply, fill #0

## 2020-09-07 ENCOUNTER — Other Ambulatory Visit: Payer: Self-pay

## 2020-09-08 ENCOUNTER — Ambulatory Visit: Payer: No Typology Code available for payment source | Admitting: Pharmacist

## 2020-09-09 ENCOUNTER — Other Ambulatory Visit: Payer: Self-pay

## 2020-09-09 ENCOUNTER — Encounter: Payer: Self-pay | Admitting: Critical Care Medicine

## 2020-09-09 ENCOUNTER — Ambulatory Visit
Payer: No Typology Code available for payment source | Attending: Critical Care Medicine | Admitting: Critical Care Medicine

## 2020-09-09 VITALS — BP 167/91 | HR 73 | Resp 16 | Wt 317.0 lb

## 2020-09-09 DIAGNOSIS — Z1211 Encounter for screening for malignant neoplasm of colon: Secondary | ICD-10-CM

## 2020-09-09 DIAGNOSIS — R809 Proteinuria, unspecified: Secondary | ICD-10-CM | POA: Diagnosis not present

## 2020-09-09 DIAGNOSIS — E1129 Type 2 diabetes mellitus with other diabetic kidney complication: Secondary | ICD-10-CM

## 2020-09-09 DIAGNOSIS — F331 Major depressive disorder, recurrent, moderate: Secondary | ICD-10-CM | POA: Diagnosis not present

## 2020-09-09 DIAGNOSIS — I10 Essential (primary) hypertension: Secondary | ICD-10-CM

## 2020-09-09 DIAGNOSIS — M1711 Unilateral primary osteoarthritis, right knee: Secondary | ICD-10-CM

## 2020-09-09 DIAGNOSIS — E119 Type 2 diabetes mellitus without complications: Secondary | ICD-10-CM

## 2020-09-09 DIAGNOSIS — I5022 Chronic systolic (congestive) heart failure: Secondary | ICD-10-CM | POA: Diagnosis not present

## 2020-09-09 LAB — GLUCOSE, POCT (MANUAL RESULT ENTRY): POC Glucose: 232 mg/dl — AB (ref 70–99)

## 2020-09-09 LAB — POCT GLYCOSYLATED HEMOGLOBIN (HGB A1C): Hemoglobin A1C: 6.4 % — AB (ref 4.0–5.6)

## 2020-09-09 MED ORDER — POTASSIUM CHLORIDE ER 8 MEQ PO TBCR
16.0000 meq | EXTENDED_RELEASE_TABLET | Freq: Two times a day (BID) | ORAL | 6 refills | Status: DC
  Filled 2020-09-09 – 2020-10-26 (×2): qty 120, 30d supply, fill #0
  Filled 2021-01-14: qty 120, 30d supply, fill #1

## 2020-09-09 MED ORDER — METFORMIN HCL 500 MG PO TABS
500.0000 mg | ORAL_TABLET | Freq: Two times a day (BID) | ORAL | 1 refills | Status: DC
  Filled 2020-09-09: qty 180, 90d supply, fill #0

## 2020-09-09 MED ORDER — ATORVASTATIN CALCIUM 10 MG PO TABS
10.0000 mg | ORAL_TABLET | Freq: Every day | ORAL | 1 refills | Status: DC
  Filled 2020-09-09 – 2020-10-26 (×2): qty 90, 90d supply, fill #0

## 2020-09-09 MED ORDER — LOSARTAN POTASSIUM 100 MG PO TABS
100.0000 mg | ORAL_TABLET | Freq: Every day | ORAL | 2 refills | Status: DC
  Filled 2020-09-09 – 2020-10-26 (×2): qty 90, 90d supply, fill #0
  Filled 2021-02-18: qty 90, 90d supply, fill #1

## 2020-09-09 MED ORDER — CANAGLIFLOZIN 300 MG PO TABS
300.0000 mg | ORAL_TABLET | Freq: Every day | ORAL | 4 refills | Status: DC
  Filled 2020-09-09: qty 30, 30d supply, fill #0

## 2020-09-09 MED ORDER — AMLODIPINE BESYLATE 10 MG PO TABS
10.0000 mg | ORAL_TABLET | Freq: Every day | ORAL | 1 refills | Status: DC
  Filled 2020-09-09 – 2020-10-26 (×2): qty 90, 90d supply, fill #0
  Filled 2021-02-18: qty 90, 90d supply, fill #1

## 2020-09-09 MED ORDER — CARVEDILOL 25 MG PO TABS
ORAL_TABLET | Freq: Two times a day (BID) | ORAL | 3 refills | Status: DC
  Filled 2020-09-09: qty 120, fill #0

## 2020-09-09 NOTE — Assessment & Plan Note (Signed)
Continue tramadol as needed for knee pain. Follow up at next appointment in this clinic or at the [place].

## 2020-09-09 NOTE — Assessment & Plan Note (Signed)
Advised to take carvedilol, losartan, and amlodipine consistently as prescribed. Discussed will have patient see clinical pharmacist weekly to have his pill box filled until the patient transitions to a primary care clinic with the [place]. Follow up at next appointment in this clinic or at the [place].

## 2020-09-09 NOTE — Assessment & Plan Note (Signed)
Advised to take metformin consistently as prescribed. Switched patient from Trulicity to Invokana due to the cost of his copay with Trulicity. Follow up at next appointment in this clinic or at the [place].

## 2020-09-09 NOTE — Assessment & Plan Note (Signed)
Advised to take furosemide consistently as prescribed. Follow up at next appointment in this clinic or at the [place].

## 2020-09-09 NOTE — Patient Instructions (Signed)
Refills on all your medications were sent to our pharmacy  We have discontinued Trulicity and your insurance plan covers Invokana which is a 300 mg tablet you take daily for your diabetes I sent a prescription for that to our pharmacy  Our clinical pharmacist met with you and refilled your pill organizer and asking him to see you weekly to continue to do this  You indicated to me that you are leaving our practice to go to the [place] please let us know when you are fully established in the [place] system so that we can close your file  Return visit will not be scheduled until we know for sure what your long-term plans are for your health care  Your A1c is 6.4 today which is quite good but your blood pressure is up likely due to the fact that you have missed your blood pressure medications for the last 3 days

## 2020-09-09 NOTE — Progress Notes (Signed)
Subjective:    Patient ID: Austin Mercer., male    DOB: May 18, 1950, 70 y.o.   MRN: [id number]  03/04/2020 70 y.o.M retired veteran primary care patient of Dr. Wynetta Emery has not been seen since April of this year has run out of his medications for approximately a week and on arrival has a blood pressure of 199/113.  He has significant increased dyspnea with this and weight gain of 10 pounds since the last visit.  He notes bilateral lower extremity edema and swelling.  He has polyuria polydipsia.  He has a productive cough with some wheezing and abdominal distention abdominal pain frequent bowel movements that are formed.  He relates the bowel movement situation to the use of the Metformin which he is on 1000 mg twice daily.  Note on arrival the patient's hemoglobin A1c is actually quite good at 6.6.  Patient does note orthopnea. Does note bilateral chronic knee pain as well. Patient states on the Metformin to 1000 g twice daily he has increased bowel movements  Patient also complains of dysphoric mood however no plans for suicidal ideation   Hypertensive urgency Patient out of medications and currently in first-degree AV block with nonspecific T wave abnormalities prolonged QTC and left atrial enlargement left ventricular hypertrophy as well as premature ventricular contractions   Blood pressure out of control on arrival 193/112 patient without any specific endorgan dysfunction other than the dyspnea   After 2 doses of 0.2 mg clonidine blood pressure down to 161/109   I will resume amlodipine 10 mg daily, increase Coreg to 12.5 mg twice daily, resume losartan 100 mg daily, increase furosemide to 40 mg daily, continue potassium supplementation   Refills on all these medications sent to our pharmacy at this date   Plan to refer to cardiology   Would also like for visiting nurse to check on the patient for a one-time check on blood pressure management and follow-up and medication adherence    PVC (premature ventricular contraction) As per hypertension assessment   First degree AV block As per hypertension assessment   Controlled type 2 diabetes mellitus without complication, without long-term current use of insulin (HCC) Hemoglobin A1c actually at goal however will switch to Trulicity once weekly 4.94 mg and Metformin 500 mg twice daily due to side effects from the higher dose of Metformin   We will have the patient seen by her clinical pharmacist in a week and he will return to see me in 3 weeks   Chronic, continuous use of opioids Patient on chronic opioid use I checked the drug database no other prescribers will refill the tramadol that he has had previously   Depression Patient scores high on PHQ-9 and I have referred this patient to our clinical social worker for follow-up   Class 3 severe obesity due to excess calories with serious comorbidity and body mass index (BMI) of 45.0 to 49.9 in adult [facility]) Severe obesity I have given the patient dietary plan to follow     Austin Mercer was seen today for shortness of breath.   Diagnoses and all orders for this visit:   Hypertensive urgency -     Ambulatory referral to [place] -     Ambulatory referral to Cardiology   Essential hypertension -     Discontinue: cloNIDine (CATAPRES) tablet 0.2 mg -     cloNIDine (CATAPRES) tablet 0.2 mg -     CBC with Differential/Platelet -     Ambulatory referral to [place] -  furosemide (LASIX) 40 MG tablet; Take 1 tablet (40 mg total) by mouth daily. -     potassium chloride (KLOR-CON) 8 MEQ tablet; Take 2 tablets (16 mEq total) by mouth 2 (two) times daily. -     losartan (COZAAR) 100 MG tablet; Take 1 tablet (100 mg total) by mouth daily. -     amLODipine (NORVASC) 10 MG tablet; Take 1 tablet (10 mg total) by mouth daily. -     carvedilol (COREG) 12.5 MG tablet; Take 1 tablet (12.5 mg total) by mouth 2 (two) times daily with a meal. Take 1 tablet p.o. twice a day -      cloNIDine (CATAPRES) tablet 0.2 mg   Controlled type 2 diabetes mellitus with microalbuminuria, without long-term current use of insulin (HCC) -     POCT glucose (manual entry) -     POCT glycosylated hemoglobin (Hb A1C) -     atorvastatin (LIPITOR) 10 MG tablet; Take 1 tablet (10 mg total) by mouth daily. -     metFORMIN (GLUCOPHAGE) 500 MG tablet; Take 1 tablet (500 mg total) by mouth 2 (two) times daily with a meal.   Controlled type 2 diabetes mellitus without complication, without long-term current use of insulin (HCC) -     Comprehensive metabolic panel -     Lipid panel -     CBC with Differential/Platelet -     Discontinue: Accu-Chek FastClix Lancets MISC; Use as directed to test blood sugar once daily. DX: E11.9 -     Discontinue: glucose blood (ACCU-CHEK GUIDE) test strip; Use as directed to test blood sugar once daily. DX: E11.9 -     glucose blood (ACCU-CHEK GUIDE) test strip; Use as directed to test blood sugar once daily. DX: E11.9 -     Discontinue: Accu-Chek FastClix Lancets MISC; Use as directed to test blood sugar once daily. DX: E11.9   Primary osteoarthritis of right knee -     traMADol (ULTRAM) 50 MG tablet; Take 1 tablet (50 mg total) by mouth every 12 (twelve) hours as needed.   First degree AV block -     Ambulatory referral to Cardiology   PVC (premature ventricular contraction) -     Ambulatory referral to Cardiology   Chronic, continuous use of opioids   Moderate episode of recurrent major depressive disorder (HCC)   Class 3 severe obesity due to excess calories with serious comorbidity and body mass index (BMI) of 45.0 to 49.9 in adult [facility])   Other orders -     Dulaglutide (TRULICITY) 5.62 ZH/0.8MV SOPN; Inject 0.75 mg into the skin once a week. -     diclofenac Sodium (VOLTAREN) 1 % GEL; Apply 2 g topically 4 (four) times daily. To knees -     Blood Pressure Monitoring (BLOOD PRESSURE MONITOR 7) DEVI; Use to monitor blood pressure.     Patient to  obtain a blood pressure meter and record values also on the refer this patient to home health this for visiting nurse to check on the patient  Pt then went to ED and f/u later by [place] PA: Avyaan Tally Mercer. reports that he was seen at the emergency department at [facility] on March 06, 2020.   Hospital course   Patient with a history of hypertension obesity presents with shortness of breath.  Patient is a very poor historian, but reports increasing shortness of breath in the past week.  He thinks it is all in his abdomen.  Denies any chest  pain. Recently seen by his PCP and had his prescriptions refilled   Patient went to the [facility] ER yesterday but left due to wait times. X-ray at that time was negative, but I repeated the chest x-ray here which revealed vascular congestion.  Clinically patient appears to be in CHF and has a BNP over thousand.  Labs from that ER visit were reviewed.  We will start nitroglycerin and lasix 5:23 AM Patient is hesitant to be admitted.  He would prefer to have ER treatment to see if he can be improved. Lasix and nitroglycerin has been given to patient 7:16 AM Patient has significant urine output. He ambulated without any hypoxia.  He reports his work of breathing is improved. He is hypertensive, though I suspect some of these readings are erroneous because he keeps moving around in the room Discussed at length need to follow-up with his PCP and take his medications. He claims to have a follow-up appointment next week. Patient is still insistent on being discharged home.     Reports that since he has been home, he is feeling a little better, but does endorse that  he is still haivng some difficulty breathing.  Patient is unsure if he is taking his Lasix on a daily basis, denies following a low-sodium diet he has not laying on a daily basis        04/21/2020 This patient returns for primary care follow-up visit with history of hypertension poorly  controlled heart failure acute systolic, type 2 diabetes, arthritis of the knee, severe obesity, history of hepatitis C virus infection previously cured.  This patient states he is having no symptoms at this time his blood sugar on arrival is 120 his blood pressure is outstanding at this visit 133/84 Patient is not had any emergency room visits since the last office visit Patient does have a visiting nurse.  Patient's received all of his COVID vaccines.  09/09/2020 Patient presents today for a follow up visit regarding diabetes and hypertension. The patient's A1c is 6.4% today and his sugar is 232 upon arrival. The patient states he has been taking his metformin 500 mg twice daily as prescribed. He has not been taking his Trulicity for the past month due to the high cost of his copay. The patient does not check his sugars at home.  The patient also has hypertension with an elevated blood pressure on arrival today of 167/91. The patient states he has not taken his hypertension medication over the past 3 days because his pill boxes have not been filled. He reports he comes to the clinic every week to have his pill box filled but he missed his appointment last week.   The patient also has chronic knee pain for which he takes tramadol. He states the tramadol controls his pain well and he has not been experiencing much knee pain recently.   Chronic systolic heart failure (HCC) Chronic systolic heart failure stable at this time  No change in medications  Essential hypertension Hypertension with improved control   Continue current medications and refill without change  Controlled type 2 diabetes mellitus without complication, without long-term current use of insulin (HCC) Controlled diabetes continue metformin  Primary osteoarthritis of right knee Has been on tramadol in the past we will refill this and I checked the [place] drug database  Toenail fungus Referral to podiatry with toenail  fungus and ingrown toenails    Wt Readings from Last 3 Encounters:  09/09/20 (!) 143.8 kg  06/28/20 (!) 152 kg  04/21/20 (!) 145.2 kg   Past Medical History:  Diagnosis Date   Diabetes ([place])    Hypertension    Hypertensive urgency 03/04/2020     Family History  Problem Relation Age of Onset   Diabetes Father      Social History   Socioeconomic History   Marital status: Divorced    Spouse name: Not on file   Number of children: 2   Years of education: Not on file   Highest education level: Not on file  Occupational History   Occupation: retired  Tobacco Use   Smoking status: Former    Pack years: 0.00    Types: Cigarettes    Quit date: 1973    Years since quitting: 49.4   Smokeless tobacco: Never  Vaping Use   Vaping Use: Never used  Substance and Sexual Activity   Alcohol use: Yes    Comment: Beer, Red wine   Drug use: Not Currently   Sexual activity: Not on file  Other Topics Concern   Not on file  Social History Narrative   Not on file   Social Determinants of Health   Financial Resource Strain: Not on file  Food Insecurity: Not on file  Transportation Needs: Not on file  Physical Activity: Not on file  Stress: Not on file  Social Connections: Not on file  Intimate Partner Violence: Not on file     Allergies  Allergen Reactions   Latex Itching     Outpatient Medications Prior to Visit  Medication Sig Dispense Refill   furosemide (LASIX) 40 MG tablet TAKE 1 TABLET (40 MG TOTAL) BY MOUTH DAILY. 90 tablet 2   glucose blood test strip USE AS DIRECTED TO TEST BLOOD SUGAR ONCE DAILY 100 strip 12   amLODipine (NORVASC) 10 MG tablet Take 1 tablet (10 mg total) by mouth daily. 90 tablet 1   atorvastatin (LIPITOR) 10 MG tablet Take 1 tablet (10 mg total) by mouth daily. 90 tablet 1   carvedilol (COREG) 12.5 MG tablet TAKE 1 TABLET (12.5 MG TOTAL) BY MOUTH 2 (TWO) TIMES DAILY WITH A MEAL. TAKE 1 TABLET P.O. TWICE A DAY 60 tablet 4   carvedilol (COREG) 25  MG tablet TAKE 1 TABLET (25 MG TOTAL) BY MOUTH 2 (TWO) TIMES DAILY WITH A MEAL. 120 tablet 3   diclofenac Sodium (VOLTAREN) 1 % GEL Apply 2 g topically 4 (four) times daily. To knees 50 g 4   losartan (COZAAR) 100 MG tablet Take 1 tablet (100 mg total) by mouth daily. 90 tablet 2   metFORMIN (GLUCOPHAGE) 500 MG tablet Take 1 tablet (500 mg total) by mouth 2 (two) times daily with a meal. 180 tablet 1   potassium chloride (KLOR-CON) 8 MEQ tablet Take 2 tablets (16 mEq total) by mouth 2 (two) times daily. 120 tablet 6   Accu-Chek Softclix Lancets lancets Use as directed to test blood sugar once daily. DX: E11.9 100 each 2   Accu-Chek Softclix Lancets lancets USE AS DIRECTED TO TEST BLOOD SUGAR ONCE DAILY. 100 each 2   Blood Glucose Monitoring Suppl (ACCU-CHEK GUIDE) w/Device KIT 1 each by Does not apply route daily. Use as directed to test blood sugar once daily. DX: E11.9 (Patient not taking: Reported on 09/09/2020) 1 kit 0   Blood Glucose Monitoring Suppl (ACCU-CHEK GUIDE) w/Device KIT USE AS DIRECTED TO TEST BLOOD SUGAR. (Patient not taking: Reported on 09/09/2020) 1 kit 0   Blood Pressure Monitoring (BLOOD PRESSURE  MONITOR 7) DEVI Use to monitor blood pressure. (Patient not taking: Reported on 09/09/2020) 1 each 0   tadalafil (CIALIS) 20 MG tablet Take 1 tablet by mouth 1 hour prior to sex as needed.  Limit use to 1 tab/24 hr (Patient not taking: Reported on 09/09/2020) 10 tablet 1   amLODipine (NORVASC) 10 MG tablet TAKE 1 TABLET (10 MG TOTAL) BY MOUTH DAILY. 90 tablet 1   atorvastatin (LIPITOR) 10 MG tablet TAKE 1 TABLET (10 MG TOTAL) BY MOUTH DAILY. 90 tablet 1   Blood Glucose Monitoring Suppl (ACCU-CHEK GUIDE ME) w/Device KIT USE AS DIRECTED TO TEST BLOOD SUGAR ONCE DAILY 1 kit 0   carvedilol (COREG) 25 MG tablet Take 1 tablet (25 mg total) by mouth 2 (two) times daily with a meal. Take 1 tablet p.o. twice a day 120 tablet 3   diclofenac Sodium (VOLTAREN) 1 % GEL APPLY 2 G TOPICALLY 4 (FOUR) TIMES  DAILY. TO KNEES 50 g 4   Dulaglutide (TRULICITY) 2.09 OB/0.9GG SOPN Inject 0.75 mg into the skin once a week. (Patient not taking: Reported on 09/09/2020) 2 mL 4   Dulaglutide (TRULICITY) 8.36 OQ/9.4TM SOPN INJECT 0.75 MG INTO THE SKIN ONCE A WEEK. 2 mL 4   furosemide (LASIX) 40 MG tablet Take 1 tablet (40 mg total) by mouth daily. 90 tablet 2   glucose blood (ACCU-CHEK GUIDE) test strip Use as directed to test blood sugar once daily. DX: E11.9 100 each 12   losartan (COZAAR) 100 MG tablet TAKE 1 TABLET (100 MG TOTAL) BY MOUTH DAILY. 90 tablet 2   metFORMIN (GLUCOPHAGE) 500 MG tablet TAKE 1 TABLET (500 MG TOTAL) BY MOUTH 2 (TWO) TIMES DAILY WITH A MEAL. 180 tablet 1   potassium chloride (KLOR-CON) 8 MEQ tablet TAKE 2 TABLETS (16 MEQ TOTAL) BY MOUTH 2 (TWO) TIMES DAILY. (Patient not taking: Reported on 09/09/2020) 120 tablet 6   predniSONE (DELTASONE) 20 MG tablet Take 1 tablet (20 mg total) by mouth daily. (Patient not taking: Reported on 09/09/2020) 5 tablet 0   predniSONE (DELTASONE) 20 MG tablet TAKE 1 TABLET (20 MG TOTAL) BY MOUTH DAILY. (Patient not taking: Reported on 09/09/2020) 5 tablet 0   traMADol (ULTRAM) 50 MG tablet TAKE 1 TABLET (50 MG TOTAL) BY MOUTH EVERY 12 (TWELVE) HOURS AS NEEDED. (Patient not taking: Reported on 09/09/2020) 60 tablet 0   No facility-administered medications prior to visit.      Review of Systems  Constitutional:  Negative for fever.  HENT:  Negative for congestion, dental problem, drooling, ear discharge, ear pain, postnasal drip, rhinorrhea, sinus pressure, sore throat and trouble swallowing.   Eyes:  Negative for visual disturbance.  Respiratory:  Negative for cough, chest tightness, shortness of breath and wheezing.   Cardiovascular:  Positive for leg swelling. Negative for chest pain.  Gastrointestinal:  Negative for abdominal distention, abdominal pain, blood in stool, nausea and vomiting.  Endocrine: Positive for polyuria. Negative for polydipsia and  polyphagia.  Genitourinary:  Positive for frequency. Negative for dysuria.  Musculoskeletal:  Positive for neck pain. Negative for gait problem and myalgias.       Knee pain   Skin:  Negative for rash.  Neurological:  Negative for dizziness, light-headedness and headaches.  Psychiatric/Behavioral: Negative.        Objective:   Physical Exam Vitals and nursing note reviewed.  Constitutional:      Appearance: He is obese.  HENT:     Head: Normocephalic and atraumatic.  Mouth/Throat:     Mouth: Mucous membranes are moist.  Eyes:     Conjunctiva/sclera: Conjunctivae normal.  Cardiovascular:     Rate and Rhythm: Normal rate and regular rhythm.     Heart sounds: Normal heart sounds. No murmur heard.   No friction rub. No gallop.  Pulmonary:     Effort: Pulmonary effort is normal. No respiratory distress.     Breath sounds: Normal breath sounds. No wheezing.  Abdominal:     General: There is no distension.     Palpations: Abdomen is soft.     Tenderness: There is no abdominal tenderness.  Musculoskeletal:        General: Swelling (Mild lower extremity edema) present.     Cervical back: Normal range of motion.  Skin:    General: Skin is warm.  Neurological:     Mental Status: He is alert and oriented to person, place, and time.   Vitals:   09/09/20 0853  BP: (!) 167/91  Pulse: 73  Resp: 16  SpO2: 96%  Weight: (!) 143.8 kg    Gen: Pleasant, obese, in no distress,  normal affect  ENT: No lesions,  mouth clear,  oropharynx clear, no postnasal drip  Neck: No JVD, no TMG, no carotid bruits  Lungs: No use of accessory muscles, no dullness to percussion, clear without rales or rhonchi  Cardiovascular: RRR, heart sounds normal, no murmur or gallops, 3+ peripheral edema  Abdomen: soft and NT, no HSM,  BS normal  Musculoskeletal: No deformities, no cyanosis or clubbing  Neuro: alert, non focal  Skin: Warm, no lesions or rashes       Assessment & Plan:  I  personally reviewed all images and lab data in the [facility] system as well as any outside material available during this office visit and agree with the  radiology impressions.   Essential hypertension Advised to take carvedilol, losartan, and amlodipine consistently as prescribed. Discussed will have patient see clinical pharmacist weekly to have his pill box filled until the patient transitions to a primary care clinic with the [place]. Follow up at next appointment in this clinic or at the [place].   Chronic systolic heart failure (HCC) Advised to take furosemide consistently as prescribed. Follow up at next appointment in this clinic or at the [place].  Controlled type 2 diabetes mellitus without complication, without long-term current use of insulin ([place]) Advised to take metformin consistently as prescribed. Switched patient from Trulicity to [place] due to the cost of his copay with Trulicity. Follow up at next appointment in this clinic or at the [place].  Primary osteoarthritis of right knee Continue tramadol as needed for knee pain. Follow up at next appointment in this clinic or at the [place].   Corian was seen today for follow-up and diabetes.  Diagnoses and all orders for this visit:  Controlled type 2 diabetes mellitus without complication, without long-term current use of insulin (HCC) -     HgB A1c -     Glucose (CBG)  Colon cancer screening  Essential hypertension -     amLODipine (NORVASC) 10 MG tablet; Take 1 tablet (10 mg total) by mouth daily. -     losartan (COZAAR) 100 MG tablet; Take 1 tablet (100 mg total) by mouth daily. -     potassium chloride (KLOR-CON) 8 MEQ tablet; Take 2 tablets (16 mEq total) by mouth 2 (two) times daily.  Controlled type 2 diabetes mellitus with microalbuminuria, without long-term current use of insulin ([place]) -  atorvastatin (LIPITOR) 10 MG tablet; Take 1 tablet (10 mg total) by mouth daily. -     metFORMIN (GLUCOPHAGE) 500 MG tablet; Take 1 tablet (500  mg total) by mouth 2 (two) times daily with a meal.  Moderate episode of recurrent major depressive disorder (HCC)  Chronic systolic heart failure (HCC)  Primary osteoarthritis of right knee  Other orders -     canagliflozin (INVOKANA) 300 MG TABS tablet; Take 1 tablet (300 mg total) by mouth daily before breakfast. -     carvedilol (COREG) 25 MG tablet; TAKE 1 TABLET (25 MG TOTAL) BY MOUTH 2 (TWO) TIMES DAILY WITH A MEAL.

## 2020-09-16 ENCOUNTER — Other Ambulatory Visit: Payer: Self-pay

## 2020-09-17 ENCOUNTER — Other Ambulatory Visit: Payer: Self-pay

## 2020-09-17 ENCOUNTER — Ambulatory Visit: Payer: No Typology Code available for payment source | Attending: Critical Care Medicine | Admitting: Pharmacist

## 2020-09-17 DIAGNOSIS — Z79899 Other long term (current) drug therapy: Secondary | ICD-10-CM

## 2020-09-17 NOTE — Progress Notes (Signed)
Patient presents today with medications and a pill box requesting for me to fill his pill box. He has all of his medications with him that we have in his profile. There is some concern for missed doses as all of his "PM" boxes still have medication in them. I encouraged compliance.   Time spent counseling: 10 minutes  Follow-up: as needed  Butch Penny, PharmD, [place], CPP Clinical Pharmacist [facility] & [facility] [phone]

## 2020-09-24 ENCOUNTER — Other Ambulatory Visit: Payer: Self-pay

## 2020-09-24 ENCOUNTER — Ambulatory Visit: Payer: No Typology Code available for payment source | Attending: Critical Care Medicine | Admitting: Pharmacist

## 2020-09-24 DIAGNOSIS — Z79899 Other long term (current) drug therapy: Secondary | ICD-10-CM

## 2020-09-24 NOTE — Progress Notes (Signed)
Patient presents today with medications and a pill box requesting for me to fill his pill box. He has all of his medications with him that we have in his profile. There is some concern for missed doses as all of his "PM" boxes still have medication in them. I encouraged compliance.   Time spent counseling: 10 minutes  Follow-up: as needed  Butch Penny, PharmD, [place], CPP Clinical Pharmacist [facility] & [facility] [phone]

## 2020-10-01 ENCOUNTER — Ambulatory Visit: Payer: No Typology Code available for payment source | Attending: Critical Care Medicine | Admitting: Pharmacist

## 2020-10-01 ENCOUNTER — Other Ambulatory Visit: Payer: Self-pay

## 2020-10-01 DIAGNOSIS — Z79899 Other long term (current) drug therapy: Secondary | ICD-10-CM

## 2020-10-01 NOTE — Progress Notes (Signed)
Patient presents today with medications and a pill box requesting for me to fill his pill box. He has all of his medications with him that we have in his profile. There is some concern for missed doses as all of his "PM" boxes still have medication in them. I encouraged compliance.   Time spent counseling: 10 minutes  Follow-up: as needed  Butch Penny, PharmD, [place], CPP Clinical Pharmacist [facility] & [facility] [phone]

## 2020-10-07 ENCOUNTER — Other Ambulatory Visit: Payer: Self-pay

## 2020-10-08 ENCOUNTER — Other Ambulatory Visit: Payer: Self-pay

## 2020-10-08 ENCOUNTER — Telehealth: Payer: Self-pay | Admitting: Pharmacist

## 2020-10-08 ENCOUNTER — Ambulatory Visit: Payer: No Typology Code available for payment source | Attending: Critical Care Medicine | Admitting: Pharmacist

## 2020-10-08 DIAGNOSIS — M1711 Unilateral primary osteoarthritis, right knee: Secondary | ICD-10-CM

## 2020-10-08 DIAGNOSIS — Z79899 Other long term (current) drug therapy: Secondary | ICD-10-CM | POA: Diagnosis not present

## 2020-10-08 NOTE — Progress Notes (Signed)
Patient presents today with medications and a pill box requesting for me to fill his pill box. He has all of his medications with him that we have in his profile. There is some concern for missed doses as all of his "PM" boxes still have medication in them. I encouraged compliance.   Time spent counseling: 10 minutes  Follow-up: as needed  Butch Penny, PharmD, [place], CPP Clinical Pharmacist [facility] & [facility] [phone]

## 2020-10-08 NOTE — Telephone Encounter (Signed)
He was going to go to the [place] for care for his pain and his knee. If that is not the case he will jeed an appt woth me for drug screen and pain contract

## 2020-10-08 NOTE — Telephone Encounter (Signed)
Dr. Delford Field,   Saw this patient and filled his pill boxes again today. Of note, he was requesting tramadol and I cannot approve this. I told him you would send only if appropriate.

## 2020-10-08 NOTE — Telephone Encounter (Signed)
Understood. I see him again Friday for med management and will inform him then.

## 2020-10-15 ENCOUNTER — Ambulatory Visit: Payer: No Typology Code available for payment source | Attending: Critical Care Medicine | Admitting: Pharmacist

## 2020-10-15 ENCOUNTER — Other Ambulatory Visit: Payer: Self-pay

## 2020-10-15 DIAGNOSIS — Z79899 Other long term (current) drug therapy: Secondary | ICD-10-CM | POA: Diagnosis not present

## 2020-10-15 NOTE — Progress Notes (Signed)
Patient presents today with medications and a pill box requesting for me to fill his pill box. He has all of his medications with him that we have in his profile. There is some concern for missed doses as all of his "PM" boxes still have medication in them. I encouraged compliance.   Time spent counseling: 10 minutes  Follow-up: as needed  Butch Penny, PharmD, [place], CPP Clinical Pharmacist [facility] & [facility] [phone]

## 2020-10-21 ENCOUNTER — Ambulatory Visit: Payer: No Typology Code available for payment source | Admitting: Pharmacist

## 2020-10-26 ENCOUNTER — Other Ambulatory Visit: Payer: Self-pay

## 2020-10-26 ENCOUNTER — Ambulatory Visit: Payer: No Typology Code available for payment source | Attending: Critical Care Medicine | Admitting: Pharmacist

## 2020-10-26 DIAGNOSIS — Z79899 Other long term (current) drug therapy: Secondary | ICD-10-CM

## 2020-10-26 NOTE — Progress Notes (Signed)
Patient presents today with medications and a pill box requesting for me to fill his pill box. He has all of his medications with him that we have in his profile. There is some concern for missed doses as all of his "PM" boxes still have medication in them. I encouraged compliance.   Time spent counseling: 10 minutes  Follow-up: as needed  Butch Penny, PharmD, [place], CPP Clinical Pharmacist [facility] & [facility] [phone]

## 2020-11-01 ENCOUNTER — Other Ambulatory Visit: Payer: Self-pay

## 2020-11-01 ENCOUNTER — Ambulatory Visit: Payer: No Typology Code available for payment source | Attending: Critical Care Medicine | Admitting: Pharmacist

## 2020-11-01 DIAGNOSIS — Z79899 Other long term (current) drug therapy: Secondary | ICD-10-CM

## 2020-11-01 NOTE — Progress Notes (Signed)
Patient presents today with medications and a pill box requesting for me to fill his pill box. He has all of his medications with him that we have in his profile. There is some concern for missed doses as all of his "PM" boxes still have medication in them. I encouraged compliance.   Time spent counseling: 10 minutes  Follow-up: as needed  Butch Penny, PharmD, [place], CPP Clinical Pharmacist [facility] & [facility] [phone]

## 2020-11-05 ENCOUNTER — Ambulatory Visit: Payer: No Typology Code available for payment source | Admitting: Pharmacist

## 2020-11-11 ENCOUNTER — Ambulatory Visit: Payer: No Typology Code available for payment source | Attending: Physician Assistant | Admitting: Physician Assistant

## 2020-11-11 ENCOUNTER — Other Ambulatory Visit: Payer: Self-pay

## 2020-11-11 ENCOUNTER — Ambulatory Visit (HOSPITAL_BASED_OUTPATIENT_CLINIC_OR_DEPARTMENT_OTHER): Payer: No Typology Code available for payment source | Admitting: Pharmacist

## 2020-11-11 ENCOUNTER — Encounter: Payer: Self-pay | Admitting: Physician Assistant

## 2020-11-11 DIAGNOSIS — R296 Repeated falls: Secondary | ICD-10-CM | POA: Diagnosis not present

## 2020-11-11 DIAGNOSIS — G8929 Other chronic pain: Secondary | ICD-10-CM

## 2020-11-11 DIAGNOSIS — Z79899 Other long term (current) drug therapy: Secondary | ICD-10-CM

## 2020-11-11 DIAGNOSIS — I5022 Chronic systolic (congestive) heart failure: Secondary | ICD-10-CM | POA: Diagnosis not present

## 2020-11-11 DIAGNOSIS — M25561 Pain in right knee: Secondary | ICD-10-CM

## 2020-11-11 MED ORDER — TRAMADOL HCL 50 MG PO TABS
50.0000 mg | ORAL_TABLET | Freq: Three times a day (TID) | ORAL | 0 refills | Status: DC | PRN
  Filled 2020-11-11: qty 15, 5d supply, fill #0

## 2020-11-11 MED ORDER — FUROSEMIDE 40 MG PO TABS
ORAL_TABLET | Freq: Every day | ORAL | 0 refills | Status: DC
  Filled 2020-11-11: qty 90, fill #0
  Filled 2020-11-11: qty 90, 90d supply, fill #0

## 2020-11-11 MED FILL — Furosemide Tab 40 MG: ORAL | 90 days supply | Qty: 90 | Fill #0 | Status: CN

## 2020-11-11 NOTE — Progress Notes (Signed)
Patient presents today with medications and a pill box requesting for me to fill his pill box. He has all of his medications with him that we have in his profile. There is some concern for missed doses as all of his "PM" boxes still have medication in them. I encouraged compliance.   Time spent counseling: 10 minutes  Follow-up: as needed  Butch Penny, PharmD, [place], CPP Clinical Pharmacist [facility] & [facility] [phone]

## 2020-11-11 NOTE — Progress Notes (Signed)
Virtual Visit via Telephone Note  I connected with Austin Mercer. on 11/11/20 at 10:50 AM EDT by telephone and verified that I am speaking with the correct person using two identifiers.  Location: Patient: home Provider: [facility]    I discussed the limitations, risks, security and privacy concerns of performing an evaluation and management service by telephone and the availability of in person appointments. I also discussed with the patient that there may be a patient responsible charge related to this service. The patient expressed understanding and agreed to proceed.   History of Present Illness:  Austin Mercer is asking for tramadol for his R knee pain.  Last Rx was in June.  He says he doesn't take it often.  He is also asking me for medication for being "off balance."  He says this has been going on for 2-3 years but never assessed by neurology.  He is scheduled to go to the [place] in 1 month.  No recent falls.  Compliant with meds.  Needs lasix RF    Observations/Objective: NAD.  Speech is slow-ish.    Assessment and Plan: 1. Frequent falls/off balance - Ambulatory referral to Neurology  2. Chronic systolic heart failure (HCC) - furosemide (LASIX) 40 MG tablet; TAKE 1 TABLET (40 MG TOTAL) BY MOUTH DAILY.  Dispense: 90 tablet; Refill: 0  3. Chronic pain of right knee - traMADol (ULTRAM) 50 MG tablet; Take 1 tablet (50 mg total) by mouth every 8 (eight) hours as needed for up to 5 days.  Dispense: 15 tablet; Refill: 0  [facility]  Follow Up Instructions: See PCP here in about 2 months   I discussed the assessment and treatment plan with the patient. The patient was provided an opportunity to ask questions and all were answered. The patient agreed with the plan and demonstrated an understanding of the instructions.   The patient was advised to call back or seek an in-person evaluation if the symptoms worsen or if the condition fails to improve as anticipated.  I  provided 16 minutes of non-face-to-face time during this encounter.   Georgian Co, PA-C  Patient ID: Austin Mercer., male   DOB: 18-Dec-1950, 70 y.o.   MRN: [id number]

## 2020-11-18 ENCOUNTER — Other Ambulatory Visit: Payer: Self-pay

## 2020-11-18 ENCOUNTER — Ambulatory Visit: Payer: No Typology Code available for payment source | Attending: Critical Care Medicine | Admitting: Pharmacist

## 2020-11-18 DIAGNOSIS — Z79899 Other long term (current) drug therapy: Secondary | ICD-10-CM

## 2020-11-18 NOTE — Progress Notes (Signed)
Patient presents today with medications and a pill box requesting for me to fill his pill box. He has all of his medications with him that we have in his profile. There is some concern for missed doses as all of his "PM" boxes still have medication in them. I encouraged compliance.   Time spent counseling: 10 minutes  Follow-up: as needed  Butch Penny, PharmD, [place], CPP Clinical Pharmacist [facility] & [facility] [phone]

## 2020-11-29 ENCOUNTER — Telehealth: Payer: Self-pay | Admitting: Critical Care Medicine

## 2020-11-30 ENCOUNTER — Other Ambulatory Visit: Payer: Self-pay

## 2020-12-03 ENCOUNTER — Telehealth: Payer: Self-pay | Admitting: Critical Care Medicine

## 2020-12-03 NOTE — Telephone Encounter (Signed)
Patient need to be scheduled for an appointment with Dr. Delford Field in Oct. Left vm for patient to call [phone]

## 2020-12-03 NOTE — Telephone Encounter (Signed)
-----   Message from Anders Simmonds, [place] sent at 11/11/2020 10:52 AM EDT ----- See PCP here in about 2 months

## 2020-12-17 ENCOUNTER — Other Ambulatory Visit: Payer: Self-pay

## 2020-12-17 ENCOUNTER — Ambulatory Visit: Payer: No Typology Code available for payment source | Attending: Critical Care Medicine | Admitting: Pharmacist

## 2020-12-17 DIAGNOSIS — Z79899 Other long term (current) drug therapy: Secondary | ICD-10-CM

## 2020-12-17 NOTE — Progress Notes (Signed)
Patient presents today with medications and a pill box requesting for me to fill his pill box. He has all of his medications with him that we have in his profile. There is some concern for missed doses as all of his "PM" boxes still have medication in them. I encouraged compliance.   Time spent counseling: 10 minutes  Follow-up: as needed  Butch Penny, PharmD, [place], CPP Clinical Pharmacist [facility] & [facility] [phone]

## 2020-12-23 ENCOUNTER — Other Ambulatory Visit: Payer: Self-pay | Admitting: Physician Assistant

## 2020-12-23 ENCOUNTER — Other Ambulatory Visit: Payer: Self-pay

## 2020-12-23 DIAGNOSIS — G8929 Other chronic pain: Secondary | ICD-10-CM

## 2020-12-23 DIAGNOSIS — M25561 Pain in right knee: Secondary | ICD-10-CM

## 2020-12-23 NOTE — Telephone Encounter (Signed)
Requested medication (s) are due for refill today: Yes  Requested medication (s) are on the active medication list: No  Last refill:  11/11/20  Future visit scheduled: No  Notes to clinic:  Medication not on list.    Requested Prescriptions  Pending Prescriptions Disp Refills   traMADol (ULTRAM) 50 MG tablet 15 tablet 0    Sig: Take 1 tablet (50 mg total) by mouth every 8 (eight) hours as needed for up to 5 days.     Not Delegated - Analgesics:  Opioid Agonists Failed - 12/23/2020  3:02 PM      Failed - This refill cannot be delegated      Failed - Urine Drug Screen completed in last 360 days      Passed - Valid encounter within last 6 months    Recent Outpatient Visits           6 days ago Medication management   [facility] Lois Huxley, Cornelius Moras, RPH-CPP   1 month ago Medication management   [facility] Lois Huxley, Cornelius Moras, RPH-CPP   1 month ago Medication management   [facility] Lois Huxley, Cornelius Moras, RPH-CPP   1 month ago Frequent falls   [facility] [place], [place], [place]   1 month ago Medication management   [facility] Lois Huxley, Cornelius Moras, RPH-CPP       Future Appointments             Tomorrow Lois Huxley, Cornelius Moras, RPH-CPP [facility]   In 1 week Lois Huxley, Cornelius Moras, RPH-CPP [facility]

## 2020-12-24 ENCOUNTER — Other Ambulatory Visit: Payer: Self-pay

## 2020-12-24 ENCOUNTER — Ambulatory Visit: Payer: No Typology Code available for payment source | Attending: Critical Care Medicine | Admitting: Pharmacist

## 2020-12-24 DIAGNOSIS — Z79899 Other long term (current) drug therapy: Secondary | ICD-10-CM

## 2020-12-24 MED ORDER — TRAMADOL HCL 50 MG PO TABS
50.0000 mg | ORAL_TABLET | Freq: Three times a day (TID) | ORAL | 0 refills | Status: DC | PRN
  Filled 2020-12-24 – 2021-01-06 (×2): qty 15, 5d supply, fill #0

## 2020-12-24 NOTE — Progress Notes (Signed)
Patient presents today with medications and a pill box requesting for me to fill his pill box. He has all of his medications with him that we have in his profile. There is some concern for missed doses as all of his "PM" boxes still have medication in them. I encouraged compliance.   Time spent counseling: 10 minutes  Follow-up: as needed  Butch Penny, PharmD, [place], CPP Clinical Pharmacist [facility] & [facility] [phone]

## 2020-12-28 DIAGNOSIS — Z23 Encounter for immunization: Secondary | ICD-10-CM | POA: Diagnosis not present

## 2020-12-28 NOTE — Progress Notes (Deleted)
[facility]  PATIENT: Austin Mercer. DOB: 1950/06/25  REFERRING CLINICIAN: Argentina Donovan, PA-C HISTORY FROM: *** REASON FOR VISIT: falls   HISTORICAL  CHIEF COMPLAINT:  No chief complaint on file.   HISTORY OF PRESENT ILLNESS:  ***  OTHER MEDICAL CONDITIONS: DM, HTN, HLD, hepatitic C, former alcohol abuse   REVIEW OF SYSTEMS: Full 14 system review of systems performed and negative with exception of: ***  ALLERGIES: Allergies  Allergen Reactions   Latex Itching    HOME MEDICATIONS: Outpatient Medications Prior to Visit  Medication Sig Dispense Refill   Accu-Chek Softclix Lancets lancets Use as directed to test blood sugar once daily. DX: E11.9 100 each 2   Accu-Chek Softclix Lancets lancets USE AS DIRECTED TO TEST BLOOD SUGAR ONCE DAILY. 100 each 2   amLODipine (NORVASC) 10 MG tablet Take 1 tablet (10 mg total) by mouth daily. 90 tablet 1   atorvastatin (LIPITOR) 10 MG tablet Take 1 tablet (10 mg total) by mouth daily. 90 tablet 1   Blood Glucose Monitoring Suppl (ACCU-CHEK GUIDE) w/Device KIT 1 each by Does not apply route daily. Use as directed to test blood sugar once daily. DX: E11.9 (Patient not taking: Reported on 09/09/2020) 1 kit 0   Blood Glucose Monitoring Suppl (ACCU-CHEK GUIDE) w/Device KIT USE AS DIRECTED TO TEST BLOOD SUGAR. (Patient not taking: Reported on 09/09/2020) 1 kit 0   Blood Pressure Monitoring (BLOOD PRESSURE MONITOR 7) DEVI Use to monitor blood pressure. (Patient not taking: Reported on 09/09/2020) 1 each 0   canagliflozin (INVOKANA) 300 MG TABS tablet Take 1 tablet (300 mg total) by mouth daily before breakfast. 30 tablet 4   carvedilol (COREG) 25 MG tablet TAKE 1 TABLET (25 MG TOTAL) BY MOUTH 2 (TWO) TIMES DAILY WITH A MEAL. 120 tablet 3   furosemide (LASIX) 40 MG tablet TAKE 1 TABLET (40 MG TOTAL) BY MOUTH DAILY. 90 tablet 0   glucose blood test strip USE AS DIRECTED TO TEST BLOOD SUGAR ONCE DAILY 100 strip 12   losartan  (COZAAR) 100 MG tablet Take 1 tablet (100 mg total) by mouth daily. 90 tablet 2   metFORMIN (GLUCOPHAGE) 500 MG tablet Take 1 tablet (500 mg total) by mouth 2 (two) times daily with a meal. 180 tablet 1   potassium chloride (KLOR-CON) 8 MEQ tablet Take 2 tablets (16 mEq total) by mouth 2 (two) times daily. 120 tablet 6   tadalafil (CIALIS) 20 MG tablet Take 1 tablet by mouth 1 hour prior to sex as needed.  Limit use to 1 tab/24 hr (Patient not taking: Reported on 09/09/2020) 10 tablet 1   traMADol (ULTRAM) 50 MG tablet Take 1 tablet (50 mg total) by mouth every 8 (eight) hours as needed for up to 5 days. 15 tablet 0   No facility-administered medications prior to visit.    PAST MEDICAL HISTORY: Past Medical History:  Diagnosis Date   Diabetes ([place])    Hypertension    Hypertensive urgency 03/04/2020    PAST SURGICAL HISTORY: Past Surgical History:  Procedure Laterality Date   ANKLE FRACTURE SURGERY      FAMILY HISTORY: Family History  Problem Relation Age of Onset   Diabetes Father     SOCIAL HISTORY: Social History   Socioeconomic History   Marital status: Divorced    Spouse name: Not on file   Number of children: 2   Years of education: Not on file   Highest education level: Not on file  Occupational  History   Occupation: retired  Tobacco Use   Smoking status: Former    Types: Cigarettes    Quit date: 1973    Years since quitting: 49.7   Smokeless tobacco: Never  Vaping Use   Vaping Use: Never used  Substance and Sexual Activity   Alcohol use: Yes    Comment: Beer, Red wine   Drug use: Not Currently   Sexual activity: Not on file  Other Topics Concern   Not on file  Social History Narrative   Not on file   Social Determinants of Health   Financial Resource Strain: Not on file  Food Insecurity: Not on file  Transportation Needs: Not on file  Physical Activity: Not on file  Stress: Not on file  Social Connections: Not on file  Intimate Partner Violence:  Not on file     PHYSICAL EXAM ***  GENERAL EXAM/CONSTITUTIONAL: Vitals: There were no vitals filed for this visit. There is no height or weight on file to calculate BMI. Wt Readings from Last 3 Encounters:  09/09/20 (!) 317 lb (143.8 kg)  06/28/20 (!) 335 lb (152 kg)  04/21/20 (!) 320 lb (145.2 kg)   Patient is in no distress; well developed, nourished and groomed; neck is supple  CARDIOVASCULAR: Examination of carotid arteries is normal; no carotid bruits Regular rate and rhythm, no murmurs Examination of peripheral vascular system by observation and palpation is normal  EYES: Pupils round and reactive to light, Visual fields full to confrontation, Extraocular movements intacts,   MUSCULOSKELETAL: Gait, strength, tone, movements noted in Neurologic exam below  NEUROLOGIC: MENTAL STATUS:  No flowsheet data found. awake, alert, oriented to person, place and time recent and remote memory intact normal attention and concentration language fluent, comprehension intact, naming intact fund of knowledge appropriate  CRANIAL NERVE:  2nd - no papilledema or hemorrhages on fundoscopic exam 2nd, 3rd, 4th, 6th - pupils equal and reactive to light, visual fields full to confrontation, extraocular muscles intact, no nystagmus 5th - facial sensation symmetric 7th - facial strength symmetric 8th - hearing intact 9th - palate elevates symmetrically, uvula midline 11th - shoulder shrug symmetric 12th - tongue protrusion midline  MOTOR:  normal bulk and tone, full strength in the BUE, BLE  SENSORY:  normal and symmetric to light touch, pinprick, temperature, vibration  COORDINATION:  finger-nose-finger, fine finger movements normal  REFLEXES:  deep tendon reflexes present and symmetric  GAIT/STATION:  normal     DIAGNOSTIC DATA (LABS, IMAGING, TESTING) - I reviewed patient records, labs, notes, testing and imaging myself where available.  Lab Results  Component Value  Date   WBC 5.7 06/01/2020   HGB 12.0 (L) 06/01/2020   HCT 36.7 (L) 06/01/2020   MCV 77.6 (L) 06/01/2020   PLT PLATELET CLUMPS NOTED ON SMEAR, UNABLE TO ESTIMATE 06/01/2020      Component Value Date/Time   NA 136 06/01/2020 0931   NA 140 03/04/2020 1231   K 3.5 06/01/2020 0931   CL 101 06/01/2020 0931   CO2 26 06/01/2020 0931   GLUCOSE 107 (H) 06/01/2020 0931   BUN 13 06/01/2020 0931   BUN 13 03/04/2020 1231   CREATININE 1.17 06/01/2020 0931   CALCIUM 11.0 (H) 06/01/2020 0931   CALCIUM 11.0 (H) 02/12/2018 1237   PROT 7.4 03/04/2020 1231   ALBUMIN 4.1 03/04/2020 1231   AST 23 03/04/2020 1231   ALT 23 03/04/2020 1231   ALKPHOS 53 03/04/2020 1231   BILITOT 1.1 03/04/2020 1231   GFRNONAA >  60 06/01/2020 0931   GFRAA 53 (L) 03/04/2020 1231   Lab Results  Component Value Date   CHOL 143 03/04/2020   HDL 34 (L) 03/04/2020   LDLCALC 90 03/04/2020   TRIG 100 03/04/2020   CHOLHDL 4.2 03/04/2020   Lab Results  Component Value Date   HGBA1C 6.4 (A) 09/09/2020   No results found for: [id number] Lab Results  Component Value Date   TSH 3.060 11/27/2017    ***    ASSESSMENT AND PLAN  70 y.o. year old male with ***   No diagnosis found.    PLAN: PT***  No orders of the defined types were placed in this encounter.   No orders of the defined types were placed in this encounter.   No follow-ups on file.    Genia Harold, MD  [facility] [address], [place], [place] [address] [phone]

## 2020-12-29 ENCOUNTER — Ambulatory Visit: Payer: Medicare HMO | Admitting: Psychiatry

## 2020-12-31 ENCOUNTER — Other Ambulatory Visit: Payer: Self-pay

## 2020-12-31 ENCOUNTER — Ambulatory Visit: Payer: No Typology Code available for payment source | Attending: Critical Care Medicine | Admitting: Pharmacist

## 2020-12-31 DIAGNOSIS — Z79899 Other long term (current) drug therapy: Secondary | ICD-10-CM | POA: Diagnosis not present

## 2020-12-31 NOTE — Progress Notes (Signed)
Patient presents today with medications and a pill box requesting for me to fill his pill box. He has all of his medications with him that we have in his profile. There is some concern for missed doses as all of his "PM" boxes still have medication in them. I encouraged compliance.   Time spent counseling: 10 minutes  Follow-up: as needed  Butch Penny, PharmD, [place], CPP Clinical Pharmacist [facility] & [facility] [phone]

## 2021-01-06 ENCOUNTER — Other Ambulatory Visit: Payer: Self-pay

## 2021-01-14 ENCOUNTER — Other Ambulatory Visit: Payer: Self-pay

## 2021-01-14 ENCOUNTER — Encounter: Payer: Self-pay | Admitting: Pharmacist

## 2021-01-14 ENCOUNTER — Ambulatory Visit: Payer: No Typology Code available for payment source | Attending: Critical Care Medicine | Admitting: Pharmacist

## 2021-01-14 DIAGNOSIS — Z79899 Other long term (current) drug therapy: Secondary | ICD-10-CM | POA: Diagnosis not present

## 2021-01-14 NOTE — Progress Notes (Signed)
Patient presents today with medications and a pill box requesting for me to fill his pill box. He has all of his medications with him that we have in his profile. There is some concern for missed doses as all of his "PM" boxes still have medication in them. I encouraged compliance.   Time spent counseling: 10 minutes  Follow-up: as needed  Butch Penny, PharmD, [place], CPP Clinical Pharmacist [facility] & [facility] [phone]

## 2021-01-18 ENCOUNTER — Ambulatory Visit: Payer: No Typology Code available for payment source | Attending: Critical Care Medicine

## 2021-01-18 ENCOUNTER — Other Ambulatory Visit: Payer: Self-pay

## 2021-01-18 DIAGNOSIS — Z23 Encounter for immunization: Secondary | ICD-10-CM | POA: Diagnosis not present

## 2021-01-21 ENCOUNTER — Other Ambulatory Visit: Payer: Self-pay

## 2021-01-21 ENCOUNTER — Ambulatory Visit: Payer: No Typology Code available for payment source | Attending: Critical Care Medicine | Admitting: Pharmacist

## 2021-01-21 DIAGNOSIS — Z79899 Other long term (current) drug therapy: Secondary | ICD-10-CM

## 2021-01-21 NOTE — Progress Notes (Signed)
Patient presents today with medications and a pill box requesting for me to fill his pill box. He has all of his medications with him that we have in his profile. There is some concern for missed doses as all of his "PM" boxes still have medication in them. I encouraged compliance.   Time spent counseling: 10 minutes  Follow-up: as needed  Butch Penny, PharmD, [place], CPP Clinical Pharmacist [facility] & [facility] [phone]

## 2021-01-28 ENCOUNTER — Other Ambulatory Visit: Payer: Self-pay

## 2021-01-28 ENCOUNTER — Other Ambulatory Visit: Payer: Self-pay | Admitting: Critical Care Medicine

## 2021-01-28 ENCOUNTER — Ambulatory Visit: Payer: No Typology Code available for payment source | Attending: Critical Care Medicine | Admitting: Pharmacist

## 2021-01-28 DIAGNOSIS — Z79899 Other long term (current) drug therapy: Secondary | ICD-10-CM | POA: Diagnosis not present

## 2021-01-28 DIAGNOSIS — G8929 Other chronic pain: Secondary | ICD-10-CM

## 2021-01-28 NOTE — Progress Notes (Signed)
Patient presents today with medications and a pill box requesting for me to fill his pill box. He has all of his medications with him that we have in his profile. There is some concern for missed doses as all of his "PM" boxes still have medication in them. I encouraged compliance.   Time spent counseling: 10 minutes  Follow-up: as needed  Butch Penny, PharmD, [place], CPP Clinical Pharmacist [facility] & [facility] [phone]

## 2021-01-29 NOTE — Telephone Encounter (Signed)
Requested medication (s) are due for refill today: yes  Requested medication (s) are on the active medication list:    NO   Last refill: 12/24/20  #15  0 refills  Future visit scheduled No   Notes to clinic: Not delegated, not on current med profile  Requested Prescriptions  Pending Prescriptions Disp Refills   traMADol (ULTRAM) 50 MG tablet 15 tablet 0    Sig: Take 1 tablet (50 mg total) by mouth every 8 (eight) hours as needed for up to 5 days.     Not Delegated - Analgesics:  Opioid Agonists Failed - 01/28/2021  1:36 PM      Failed - This refill cannot be delegated      Failed - Urine Drug Screen completed in last 360 days      Passed - Valid encounter within last 6 months    Recent Outpatient Visits           Yesterday Medication management   [facility] Lois Huxley, Cornelius Moras, RPH-CPP   1 week ago Medication management   [facility] Lois Huxley, Cornelius Moras, RPH-CPP   2 weeks ago Medication management   [facility] Lois Huxley, Cornelius Moras, RPH-CPP   4 weeks ago Medication management   [facility] Lois Huxley, Cornelius Moras, RPH-CPP   1 month ago Medication management   [facility] Lois Huxley, Cornelius Moras, RPH-CPP       Future Appointments             In 6 days Lois Huxley, Cornelius Moras, RPH-CPP [facility]   In 1 week Lois Huxley, Cornelius Moras, RPH-CPP [facility]   In 2 weeks Lois Huxley, Cornelius Moras, RPH-CPP [facility]

## 2021-01-31 ENCOUNTER — Other Ambulatory Visit: Payer: Self-pay

## 2021-01-31 MED ORDER — TRAMADOL HCL 50 MG PO TABS
50.0000 mg | ORAL_TABLET | Freq: Three times a day (TID) | ORAL | 0 refills | Status: AC | PRN
  Filled 2021-01-31: qty 15, 5d supply, fill #0

## 2021-02-04 ENCOUNTER — Other Ambulatory Visit: Payer: Self-pay

## 2021-02-04 ENCOUNTER — Ambulatory Visit: Payer: No Typology Code available for payment source | Attending: Critical Care Medicine | Admitting: Pharmacist

## 2021-02-04 DIAGNOSIS — Z79899 Other long term (current) drug therapy: Secondary | ICD-10-CM | POA: Diagnosis not present

## 2021-02-04 NOTE — Progress Notes (Signed)
Patient presents today with medications and a pill box requesting for me to fill his pill box. He has all of his medications with him that we have in his profile. There is some concern for missed doses as all of his "PM" boxes still have medication in them. I encouraged compliance.   Time spent counseling: 10 minutes  Follow-up: as needed  Butch Penny, PharmD, [place], CPP Clinical Pharmacist [facility] & [facility] [phone]

## 2021-02-11 ENCOUNTER — Other Ambulatory Visit: Payer: Self-pay

## 2021-02-11 ENCOUNTER — Ambulatory Visit: Payer: No Typology Code available for payment source | Attending: Critical Care Medicine | Admitting: Pharmacist

## 2021-02-11 DIAGNOSIS — Z79899 Other long term (current) drug therapy: Secondary | ICD-10-CM | POA: Diagnosis not present

## 2021-02-11 NOTE — Progress Notes (Signed)
Patient presents today with medications and a pill box requesting for me to fill his pill box. He has all of his medications with him that we have in his profile. There is some concern for missed doses as all of his "PM" boxes still have medication in them. I encouraged compliance.   Time spent counseling: 10 minutes  Follow-up: as needed  Butch Penny, PharmD, [place], CPP Clinical Pharmacist [facility] & [facility] [phone]

## 2021-02-15 ENCOUNTER — Ambulatory Visit: Payer: No Typology Code available for payment source | Admitting: Critical Care Medicine

## 2021-02-15 NOTE — Progress Notes (Incomplete)
Established Patient Office Visit  Subjective:  Patient ID: Austin Jacquin., male    DOB: [birth year]  Age: 70 y.o. MRN: [id number]  CC: No chief complaint on file.   HPI Austin Mercer. presents for  Had flu and pcv,  needs Eye exam.  Essential hypertension Advised to take carvedilol, losartan, and amlodipine consistently as prescribed. Discussed will have patient see clinical pharmacist weekly to have his pill box filled until the patient transitions to a primary care clinic with the [place]. Follow up at next appointment in this clinic or at the [place].    Chronic systolic heart failure (HCC) Advised to take furosemide consistently as prescribed. Follow up at next appointment in this clinic or at the [place].   Controlled type 2 diabetes mellitus without complication, without long-term current use of insulin ([place]) Advised to take metformin consistently as prescribed. Switched patient from Trulicity to [place] due to the cost of his copay with Trulicity. Follow up at next appointment in this clinic or at the [place].   Primary osteoarthritis of right knee Continue tramadol as needed for knee pain. Follow up at next appointment in this clinic or at the [place].    Past Medical History:  Diagnosis Date   Diabetes ([place])    Hypertension    Hypertensive urgency 03/04/2020    Past Surgical History:  Procedure Laterality Date   ANKLE FRACTURE SURGERY      Family History  Problem Relation Age of Onset   Diabetes Father     Social History   Socioeconomic History   Marital status: Divorced    Spouse name: Not on file   Number of children: 2   Years of education: Not on file   Highest education level: Not on file  Occupational History   Occupation: retired  Tobacco Use   Smoking status: Former    Types: Cigarettes    Quit date: 1973    Years since quitting: 49.9   Smokeless tobacco: Never  Vaping Use   Vaping Use: Never used  Substance and Sexual Activity   Alcohol  use: Yes    Comment: Beer, Red wine   Drug use: Not Currently   Sexual activity: Not on file  Other Topics Concern   Not on file  Social History Narrative   Not on file   Social Determinants of Health   Financial Resource Strain: Not on file  Food Insecurity: Not on file  Transportation Needs: Not on file  Physical Activity: Not on file  Stress: Not on file  Social Connections: Not on file  Intimate Partner Violence: Not on file    Outpatient Medications Prior to Visit  Medication Sig Dispense Refill   Accu-Chek Softclix Lancets lancets Use as directed to test blood sugar once daily. DX: E11.9 100 each 2   Accu-Chek Softclix Lancets lancets USE AS DIRECTED TO TEST BLOOD SUGAR ONCE DAILY. 100 each 2   amLODipine (NORVASC) 10 MG tablet Take 1 tablet (10 mg total) by mouth daily. 90 tablet 1   atorvastatin (LIPITOR) 10 MG tablet Take 1 tablet (10 mg total) by mouth daily. 90 tablet 1   Blood Glucose Monitoring Suppl (ACCU-CHEK GUIDE) w/Device KIT 1 each by Does not apply route daily. Use as directed to test blood sugar once daily. DX: E11.9 (Patient not taking: Reported on 09/09/2020) 1 kit 0   Blood Glucose Monitoring Suppl (ACCU-CHEK GUIDE) w/Device KIT USE AS DIRECTED TO TEST BLOOD SUGAR. (Patient not taking: Reported on 09/09/2020) 1  kit 0   Blood Pressure Monitoring (BLOOD PRESSURE MONITOR 7) DEVI Use to monitor blood pressure. (Patient not taking: Reported on 09/09/2020) 1 each 0   canagliflozin (INVOKANA) 300 MG TABS tablet Take 1 tablet (300 mg total) by mouth daily before breakfast. 30 tablet 4   carvedilol (COREG) 25 MG tablet TAKE 1 TABLET (25 MG TOTAL) BY MOUTH 2 (TWO) TIMES DAILY WITH A MEAL. 120 tablet 3   furosemide (LASIX) 40 MG tablet TAKE 1 TABLET (40 MG TOTAL) BY MOUTH DAILY. 90 tablet 0   glucose blood test strip USE AS DIRECTED TO TEST BLOOD SUGAR ONCE DAILY 100 strip 12   losartan (COZAAR) 100 MG tablet Take 1 tablet (100 mg total) by mouth daily. 90  tablet 2   metFORMIN (GLUCOPHAGE) 500 MG tablet Take 1 tablet (500 mg total) by mouth 2 (two) times daily with a meal. 180 tablet 1   potassium chloride (KLOR-CON) 8 MEQ tablet Take 2 tablets (16 mEq total) by mouth 2 (two) times daily. 120 tablet 6   tadalafil (CIALIS) 20 MG tablet Take 1 tablet by mouth 1 hour prior to sex as needed.  Limit use to 1 tab/24 hr (Patient not taking: Reported on 09/09/2020) 10 tablet 1   No facility-administered medications prior to visit.    Allergies  Allergen Reactions   Latex Itching    ROS Review of Systems    Objective:    Physical Exam  There were no vitals taken for this visit. Wt Readings from Last 3 Encounters:  09/09/20 (!) 317 lb (143.8 kg)  06/28/20 (!) 335 lb (152 kg)  04/21/20 (!) 320 lb (145.2 kg)     Health Maintenance Due  Topic Date Due   OPHTHALMOLOGY EXAM  Never done   Zoster Vaccines- Shingrix (1 of 2) Never done   COVID-19 Vaccine (4 - Booster for Moderna series) 05/31/2020    There are no preventive care reminders to display for this patient.  Lab Results  Component Value Date   TSH 3.060 11/27/2017   Lab Results  Component Value Date   WBC 5.7 06/01/2020   HGB 12.0 (L) 06/01/2020   HCT 36.7 (L) 06/01/2020   MCV 77.6 (L) 06/01/2020   PLT PLATELET CLUMPS NOTED ON SMEAR, UNABLE TO ESTIMATE 06/01/2020   Lab Results  Component Value Date   NA 136 06/01/2020   K 3.5 06/01/2020   CO2 26 06/01/2020   GLUCOSE 107 (H) 06/01/2020   BUN 13 06/01/2020   CREATININE 1.17 06/01/2020   BILITOT 1.1 03/04/2020   ALKPHOS 53 03/04/2020   AST 23 03/04/2020   ALT 23 03/04/2020   PROT 7.4 03/04/2020   ALBUMIN 4.1 03/04/2020   CALCIUM 11.0 (H) 06/01/2020   ANIONGAP 9 06/01/2020   Lab Results  Component Value Date   CHOL 143 03/04/2020   Lab Results  Component Value Date   HDL 34 (L) 03/04/2020   Lab Results  Component Value Date   LDLCALC 90 03/04/2020   Lab Results  Component Value Date   TRIG 100  03/04/2020   Lab Results  Component Value Date   CHOLHDL 4.2 03/04/2020   Lab Results  Component Value Date   HGBA1C 6.4 (A) 09/09/2020      Assessment & Plan:   Problem List Items Addressed This Visit   None   No orders of the defined types were placed in this encounter.   Follow-up: No follow-ups on file.    Asencion Noble, MD

## 2021-02-18 ENCOUNTER — Other Ambulatory Visit: Payer: Self-pay

## 2021-02-18 ENCOUNTER — Ambulatory Visit: Payer: No Typology Code available for payment source | Attending: Critical Care Medicine | Admitting: Pharmacist

## 2021-02-18 DIAGNOSIS — Z7189 Other specified counseling: Secondary | ICD-10-CM

## 2021-02-18 NOTE — Progress Notes (Signed)
Patient presents today with medications and a pill box requesting for me to fill his pill box. He has all of his medications with him that we have in his profile. There is some concern for missed doses as all of his "PM" boxes still have medication in them. I encouraged compliance.   Time spent counseling: 15 minutes  Follow-up: as needed  Butch Penny, PharmD, [place], CPP Clinical Pharmacist [facility] [phone]

## 2021-02-23 ENCOUNTER — Other Ambulatory Visit: Payer: Self-pay

## 2021-02-23 ENCOUNTER — Encounter: Payer: Self-pay | Admitting: Critical Care Medicine

## 2021-02-23 ENCOUNTER — Ambulatory Visit
Payer: No Typology Code available for payment source | Attending: Critical Care Medicine | Admitting: Critical Care Medicine

## 2021-02-23 VITALS — BP 174/92 | HR 68 | Resp 16 | Wt 330.6 lb

## 2021-02-23 DIAGNOSIS — N529 Male erectile dysfunction, unspecified: Secondary | ICD-10-CM

## 2021-02-23 DIAGNOSIS — E119 Type 2 diabetes mellitus without complications: Secondary | ICD-10-CM

## 2021-02-23 DIAGNOSIS — I5022 Chronic systolic (congestive) heart failure: Secondary | ICD-10-CM | POA: Diagnosis not present

## 2021-02-23 DIAGNOSIS — I10 Essential (primary) hypertension: Secondary | ICD-10-CM

## 2021-02-23 DIAGNOSIS — M1711 Unilateral primary osteoarthritis, right knee: Secondary | ICD-10-CM

## 2021-02-23 DIAGNOSIS — R809 Proteinuria, unspecified: Secondary | ICD-10-CM | POA: Diagnosis not present

## 2021-02-23 DIAGNOSIS — E1129 Type 2 diabetes mellitus with other diabetic kidney complication: Secondary | ICD-10-CM | POA: Diagnosis not present

## 2021-02-23 LAB — POCT GLYCOSYLATED HEMOGLOBIN (HGB A1C): HbA1c, POC (controlled diabetic range): 6.6 % (ref 0.0–7.0)

## 2021-02-23 LAB — GLUCOSE, POCT (MANUAL RESULT ENTRY): POC Glucose: 151 mg/dl — AB (ref 70–99)

## 2021-02-23 MED ORDER — TADALAFIL 20 MG PO TABS
ORAL_TABLET | ORAL | 1 refills | Status: DC
  Filled 2021-02-23 – 2021-04-20 (×2): qty 6, 23d supply, fill #0
  Filled 2021-06-16: qty 6, 23d supply, fill #1

## 2021-02-23 MED ORDER — ATORVASTATIN CALCIUM 10 MG PO TABS
10.0000 mg | ORAL_TABLET | Freq: Every day | ORAL | 1 refills | Status: DC
  Filled 2021-02-23 – 2021-08-03 (×2): qty 90, 90d supply, fill #0

## 2021-02-23 MED ORDER — CANAGLIFLOZIN 300 MG PO TABS
300.0000 mg | ORAL_TABLET | Freq: Every day | ORAL | 4 refills | Status: DC
Start: 2021-02-23 — End: 2021-09-13
  Filled 2021-02-23: qty 30, 30d supply, fill #0

## 2021-02-23 MED ORDER — POTASSIUM CHLORIDE ER 8 MEQ PO TBCR
16.0000 meq | EXTENDED_RELEASE_TABLET | Freq: Two times a day (BID) | ORAL | 6 refills | Status: DC
  Filled 2021-02-23: qty 180, 45d supply, fill #0
  Filled 2021-03-11 – 2021-05-31 (×5): qty 120, 30d supply, fill #0
  Filled 2021-08-03: qty 120, 30d supply, fill #1

## 2021-02-23 MED ORDER — AMLODIPINE BESYLATE 10 MG PO TABS
10.0000 mg | ORAL_TABLET | Freq: Every day | ORAL | 1 refills | Status: DC
  Filled 2021-02-23 – 2021-07-08 (×2): qty 90, 90d supply, fill #0

## 2021-02-23 MED ORDER — FUROSEMIDE 40 MG PO TABS
ORAL_TABLET | Freq: Every day | ORAL | 2 refills | Status: DC
  Filled 2021-02-23 – 2021-07-08 (×2): qty 90, 90d supply, fill #0

## 2021-02-23 MED ORDER — METFORMIN HCL 500 MG PO TABS
500.0000 mg | ORAL_TABLET | Freq: Two times a day (BID) | ORAL | 1 refills | Status: DC
  Filled 2021-02-23: qty 180, 90d supply, fill #0

## 2021-02-23 MED ORDER — CARVEDILOL 25 MG PO TABS
ORAL_TABLET | Freq: Two times a day (BID) | ORAL | 3 refills | Status: DC
Start: 2021-02-23 — End: 2021-09-13
  Filled 2021-02-23: qty 180, 90d supply, fill #0

## 2021-02-23 MED ORDER — LOSARTAN POTASSIUM 100 MG PO TABS
100.0000 mg | ORAL_TABLET | Freq: Every day | ORAL | 2 refills | Status: DC
  Filled 2021-02-23 – 2021-06-16 (×2): qty 90, 90d supply, fill #0

## 2021-02-23 NOTE — Assessment & Plan Note (Addendum)
As per hypertension assessment 

## 2021-02-23 NOTE — Assessment & Plan Note (Signed)
No change in diabetic program

## 2021-02-23 NOTE — Assessment & Plan Note (Signed)
Poorly controlled due to nonadherence to medical regimen despite coming in weekly for pill organizer  Connected with clinical pharmacy who will follow this patient's blood pressure up for management  I refilled all his medications encouraged him to take the evening dose of medicines  Complete set of screening labs will be occurring today

## 2021-02-23 NOTE — Assessment & Plan Note (Signed)
Referral to orthopedic surgery made 

## 2021-02-23 NOTE — Progress Notes (Signed)
Established Patient Office Visit  Subjective:  Patient ID: Austin Greathouse., male    DOB: 1950-11-22  Age: 70 y.o. MRN: [id number]  CC:  Chief Complaint  Patient presents with   Knee Pain   Medication Refill    HPI Austin Tally Joe. presents for primary care follow-up visit  Patient continues to complain of right knee pain and he decided not to go with the [facility] system for care.  He would like an orthopedic referral.  Patient's been taking chronic tramadol for the knee pain.  He is under a pain contract at this time.  Also on arrival blood pressure elevated 174/92 and he has not been compliant with his evening doses of blood pressure medicines  He denies any shortness of breath or chest pain.  His biggest complaint is focused on the right knee.  General review of his dietary habits shows he takes quite a bit of salt in the diet and is noncompliant with diabetic restrictions Past Medical History:  Diagnosis Date   Diabetes ([place])    Hypertension    Hypertensive urgency 03/04/2020    Past Surgical History:  Procedure Laterality Date   ANKLE FRACTURE SURGERY      Family History  Problem Relation Age of Onset   Diabetes Father     Social History   Socioeconomic History   Marital status: Divorced    Spouse name: Not on file   Number of children: 2   Years of education: Not on file   Highest education level: Not on file  Occupational History   Occupation: retired  Tobacco Use   Smoking status: Former    Types: Cigarettes    Quit date: 1973    Years since quitting: 49.9   Smokeless tobacco: Never  Vaping Use   Vaping Use: Never used  Substance and Sexual Activity   Alcohol use: Yes    Comment: Beer, Red wine   Drug use: Not Currently   Sexual activity: Not on file  Other Topics Concern   Not on file  Social History Narrative   Not on file   Social Determinants of Health   Financial Resource Strain: Not on file  Food Insecurity: Not on file  Transportation  Needs: Not on file  Physical Activity: Not on file  Stress: Not on file  Social Connections: Not on file  Intimate Partner Violence: Not on file    Outpatient Medications Prior to Visit  Medication Sig Dispense Refill   Accu-Chek Softclix Lancets lancets Use as directed to test blood sugar once daily. DX: E11.9 100 each 2   Blood Glucose Monitoring Suppl (ACCU-CHEK GUIDE) w/Device KIT 1 each by Does not apply route daily. Use as directed to test blood sugar once daily. DX: E11.9 1 kit 0   Blood Glucose Monitoring Suppl (ACCU-CHEK GUIDE) w/Device KIT USE AS DIRECTED TO TEST BLOOD SUGAR. 1 kit 0   glucose blood test strip USE AS DIRECTED TO TEST BLOOD SUGAR ONCE DAILY 100 strip 12   amLODipine (NORVASC) 10 MG tablet Take 1 tablet (10 mg total) by mouth daily. 90 tablet 1   atorvastatin (LIPITOR) 10 MG tablet Take 1 tablet (10 mg total) by mouth daily. 90 tablet 1   canagliflozin (INVOKANA) 300 MG TABS tablet Take 1 tablet (300 mg total) by mouth daily before breakfast. 30 tablet 4   carvedilol (COREG) 25 MG tablet TAKE 1 TABLET (25 MG TOTAL) BY MOUTH 2 (TWO) TIMES DAILY WITH A MEAL. 120 tablet 3  furosemide (LASIX) 40 MG tablet TAKE 1 TABLET (40 MG TOTAL) BY MOUTH DAILY. 90 tablet 0   losartan (COZAAR) 100 MG tablet Take 1 tablet (100 mg total) by mouth daily. 90 tablet 2   metFORMIN (GLUCOPHAGE) 500 MG tablet Take 1 tablet (500 mg total) by mouth 2 (two) times daily with a meal. 180 tablet 1   potassium chloride (KLOR-CON) 8 MEQ tablet Take 2 tablets (16 mEq total) by mouth 2 (two) times daily. 120 tablet 6   tadalafil (CIALIS) 20 MG tablet Take 1 tablet by mouth 1 hour prior to sex as needed.  Limit use to 1 tab/24 hr (Patient taking differently: Take 1 tablet by mouth 1 hour prior to sex as needed.  Limit use to 1 tab/24 hr) 10 tablet 1   Accu-Chek Softclix Lancets lancets USE AS DIRECTED TO TEST BLOOD SUGAR ONCE DAILY. 100 each 2   Blood Pressure Monitoring (BLOOD PRESSURE MONITOR 7) DEVI  Use to monitor blood pressure. (Patient not taking: Reported on 09/09/2020) 1 each 0   No facility-administered medications prior to visit.    Allergies  Allergen Reactions   Latex Itching    ROS Review of Systems  Constitutional: Negative.   HENT: Negative.  Negative for ear pain, postnasal drip, rhinorrhea, sinus pressure, sore throat, trouble swallowing and voice change.   Eyes: Negative.   Respiratory: Negative.  Negative for apnea, cough, choking, chest tightness, shortness of breath, wheezing and stridor.   Cardiovascular: Negative.  Negative for chest pain, palpitations and leg swelling.  Gastrointestinal: Negative.  Negative for abdominal distention, abdominal pain, nausea and vomiting.  Genitourinary: Negative.   Musculoskeletal:  Positive for gait problem and joint swelling. Negative for arthralgias and myalgias.       Chronic right knee pain  Skin: Negative.  Negative for rash.  Allergic/Immunologic: Negative.  Negative for environmental allergies and food allergies.  Neurological:  Negative for dizziness, syncope, weakness and headaches.  Hematological: Negative.  Negative for adenopathy. Does not bruise/bleed easily.  Psychiatric/Behavioral: Negative.  Negative for agitation and sleep disturbance. The patient is not nervous/anxious.      Objective:    Physical Exam Vitals reviewed.  Constitutional:      Appearance: Normal appearance. He is well-developed. He is obese. He is not diaphoretic.  HENT:     Head: Normocephalic and atraumatic.     Nose: No nasal deformity, septal deviation, mucosal edema or rhinorrhea.     Right Sinus: No maxillary sinus tenderness or frontal sinus tenderness.     Left Sinus: No maxillary sinus tenderness or frontal sinus tenderness.     Mouth/Throat:     Pharynx: No oropharyngeal exudate.  Eyes:     General: No scleral icterus.    Conjunctiva/sclera: Conjunctivae normal.     Pupils: Pupils are equal, round, and reactive to light.   Neck:     Thyroid: No thyromegaly.     Vascular: No carotid bruit or JVD.     Trachea: Trachea normal. No tracheal tenderness or tracheal deviation.  Cardiovascular:     Rate and Rhythm: Normal rate and regular rhythm.     Chest Wall: PMI is not displaced.     Pulses: Normal pulses. No decreased pulses.     Heart sounds: Normal heart sounds, S1 normal and S2 normal. Heart sounds not distant. No murmur heard. No systolic murmur is present.  No diastolic murmur is present.    No friction rub. No gallop. No S3 or S4 sounds.  Pulmonary:  Effort: Pulmonary effort is normal. No tachypnea, accessory muscle usage or respiratory distress.     Breath sounds: Normal breath sounds. No stridor. No decreased breath sounds, wheezing, rhonchi or rales.  Chest:     Chest wall: No tenderness.  Abdominal:     General: Bowel sounds are normal. There is no distension.     Palpations: Abdomen is soft. Abdomen is not rigid.     Tenderness: There is no abdominal tenderness. There is no guarding or rebound.  Musculoskeletal:        General: Tenderness present.     Cervical back: Normal range of motion and neck supple. No edema, erythema or rigidity. No muscular tenderness. Normal range of motion.     Comments: Decreased range of motion tenderness on right knee  Lymphadenopathy:     Head:     Right side of head: No submental or submandibular adenopathy.     Left side of head: No submental or submandibular adenopathy.     Cervical: No cervical adenopathy.  Skin:    General: Skin is warm and dry.     Coloration: Skin is not pale.     Findings: No rash.     Nails: There is no clubbing.  Neurological:     General: No focal deficit present.     Mental Status: He is alert and oriented to person, place, and time. Mental status is at baseline.     Sensory: No sensory deficit.  Psychiatric:        Mood and Affect: Mood normal.        Speech: Speech normal.        Behavior: Behavior normal.         Thought Content: Thought content normal.        Judgment: Judgment normal.    BP (!) 174/92   Pulse 68   Resp 16   Wt (!) 330 lb 9.6 oz (150 kg)   SpO2 98%   BMI 43.62 kg/m  Wt Readings from Last 3 Encounters:  02/23/21 (!) 330 lb 9.6 oz (150 kg)  09/09/20 (!) 317 lb (143.8 kg)  06/28/20 (!) 335 lb (152 kg)     Health Maintenance Due  Topic Date Due   OPHTHALMOLOGY EXAM  Never done   Zoster Vaccines- Shingrix (1 of 2) Never done   COVID-19 Vaccine (4 - Booster for Moderna series) 05/31/2020    There are no preventive care reminders to display for this patient.  Lab Results  Component Value Date   TSH 3.060 11/27/2017   Lab Results  Component Value Date   WBC 5.7 06/01/2020   HGB 12.0 (L) 06/01/2020   HCT 36.7 (L) 06/01/2020   MCV 77.6 (L) 06/01/2020   PLT PLATELET CLUMPS NOTED ON SMEAR, UNABLE TO ESTIMATE 06/01/2020   Lab Results  Component Value Date   NA 136 06/01/2020   K 3.5 06/01/2020   CO2 26 06/01/2020   GLUCOSE 107 (H) 06/01/2020   BUN 13 06/01/2020   CREATININE 1.17 06/01/2020   BILITOT 1.1 03/04/2020   ALKPHOS 53 03/04/2020   AST 23 03/04/2020   ALT 23 03/04/2020   PROT 7.4 03/04/2020   ALBUMIN 4.1 03/04/2020   CALCIUM 11.0 (H) 06/01/2020   ANIONGAP 9 06/01/2020   Lab Results  Component Value Date   CHOL 143 03/04/2020   Lab Results  Component Value Date   HDL 34 (L) 03/04/2020   Lab Results  Component Value Date   LDLCALC 90 03/04/2020  Lab Results  Component Value Date   TRIG 100 03/04/2020   Lab Results  Component Value Date   CHOLHDL 4.2 03/04/2020   Lab Results  Component Value Date   HGBA1C 6.6 02/23/2021      Assessment & Plan:   Problem List Items Addressed This Visit       Cardiovascular and Mediastinum   Essential hypertension    Poorly controlled due to nonadherence to medical regimen despite coming in weekly for pill organizer  Connected with clinical pharmacy who will follow this patient's blood  pressure up for management  I refilled all his medications encouraged him to take the evening dose of medicines  Complete set of screening labs will be occurring today      Relevant Medications   atorvastatin (LIPITOR) 10 MG tablet   amLODipine (NORVASC) 10 MG tablet   carvedilol (COREG) 25 MG tablet   furosemide (LASIX) 40 MG tablet   losartan (COZAAR) 100 MG tablet   potassium chloride (KLOR-CON) 8 MEQ tablet   tadalafil (CIALIS) 20 MG tablet   Other Relevant Orders   CBC with Differential/Platelet   Chronic systolic heart failure (HCC)    As per hypertension assessment      Relevant Medications   atorvastatin (LIPITOR) 10 MG tablet   amLODipine (NORVASC) 10 MG tablet   carvedilol (COREG) 25 MG tablet   furosemide (LASIX) 40 MG tablet   losartan (COZAAR) 100 MG tablet   tadalafil (CIALIS) 20 MG tablet   Other Relevant Orders   Comprehensive metabolic panel     Endocrine   Controlled type 2 diabetes mellitus without complication, without long-term current use of insulin (HCC)    No change in diabetic program      Relevant Medications   atorvastatin (LIPITOR) 10 MG tablet   canagliflozin (INVOKANA) 300 MG TABS tablet   losartan (COZAAR) 100 MG tablet   metFORMIN (GLUCOPHAGE) 500 MG tablet     Musculoskeletal and Integument   Primary osteoarthritis of right knee - Primary    Referral to orthopedic surgery made      Relevant Orders   Ambulatory referral to Orthopedic Surgery     Other   Erectile dysfunction of organic origin   Relevant Medications   tadalafil (CIALIS) 20 MG tablet   Other Visit Diagnoses     Controlled type 2 diabetes mellitus with microalbuminuria, without long-term current use of insulin (HCC)       Relevant Medications   atorvastatin (LIPITOR) 10 MG tablet   canagliflozin (INVOKANA) 300 MG TABS tablet   losartan (COZAAR) 100 MG tablet   metFORMIN (GLUCOPHAGE) 500 MG tablet   Other Relevant Orders   Comprehensive metabolic panel    Hemoglobin A1c   Lipid panel   POCT glucose (manual entry) (Completed)   POCT glycosylated hemoglobin (Hb A1C) (Completed)       Meds ordered this encounter  Medications   atorvastatin (LIPITOR) 10 MG tablet    Sig: Take 1 tablet (10 mg total) by mouth daily.    Dispense:  90 tablet    Refill:  1   amLODipine (NORVASC) 10 MG tablet    Sig: Take 1 tablet (10 mg total) by mouth daily.    Dispense:  90 tablet    Refill:  1   canagliflozin (INVOKANA) 300 MG TABS tablet    Sig: Take 1 tablet (300 mg total) by mouth daily before breakfast.    Dispense:  30 tablet    Refill:  4  carvedilol (COREG) 25 MG tablet    Sig: TAKE 1 TABLET (25 MG TOTAL) BY MOUTH 2 (TWO) TIMES DAILY WITH A MEAL.    Dispense:  120 tablet    Refill:  3   furosemide (LASIX) 40 MG tablet    Sig: TAKE 1 TABLET (40 MG TOTAL) BY MOUTH DAILY.    Dispense:  90 tablet    Refill:  2   losartan (COZAAR) 100 MG tablet    Sig: Take 1 tablet (100 mg total) by mouth daily.    Dispense:  90 tablet    Refill:  2   metFORMIN (GLUCOPHAGE) 500 MG tablet    Sig: Take 1 tablet (500 mg total) by mouth 2 (two) times daily with a meal.    Dispense:  180 tablet    Refill:  1   potassium chloride (KLOR-CON) 8 MEQ tablet    Sig: Take 2 tablets (16 mEq total) by mouth 2 (two) times daily.    Dispense:  120 tablet    Refill:  6   tadalafil (CIALIS) 20 MG tablet    Sig: Take 1 tablet by mouth 1 hour prior to sex as needed.  Limit use to 1 tab/24 hr    Dispense:  10 tablet    Refill:  1  Referral to ophthalmology made for eye exam for diabetes  Follow-up: No follow-ups on file.    Asencion Noble, MD

## 2021-02-23 NOTE — Patient Instructions (Signed)
No change in medications, refill sent to our pharmacy  See Franky Macho in a week for your pill organizer and for blood pressure management follow-up  Note the importance of taking the afternoon doses of medicines in your pill organizer this is 1 reason you are not at goal for your blood pressure and your diabetes  We discussed following a healthier diet  Referral to orthopedics made for your knee pain  Return to see Dr. Delford Field 2 months  Complete set of screening follow-up labs will be obtained today

## 2021-02-24 LAB — COMPREHENSIVE METABOLIC PANEL
ALT: 15 IU/L (ref 0–44)
AST: 15 IU/L (ref 0–40)
Albumin/Globulin Ratio: 1.4 (ref 1.2–2.2)
Albumin: 4.1 g/dL (ref 3.8–4.8)
Alkaline Phosphatase: 54 IU/L (ref 44–121)
BUN/Creatinine Ratio: 9 — ABNORMAL LOW (ref 10–24)
BUN: 10 mg/dL (ref 8–27)
Bilirubin Total: 0.4 mg/dL (ref 0.0–1.2)
CO2: 25 mmol/L (ref 20–29)
Calcium: 10.7 mg/dL — ABNORMAL HIGH (ref 8.6–10.2)
Chloride: 101 mmol/L (ref 96–106)
Creatinine, Ser: 1.11 mg/dL (ref 0.76–1.27)
Globulin, Total: 3 g/dL (ref 1.5–4.5)
Glucose: 148 mg/dL — ABNORMAL HIGH (ref 70–99)
Potassium: 3.6 mmol/L (ref 3.5–5.2)
Sodium: 138 mmol/L (ref 134–144)
Total Protein: 7.1 g/dL (ref 6.0–8.5)
eGFR: 72 mL/min/{1.73_m2} (ref >59)

## 2021-02-24 LAB — CBC WITH DIFFERENTIAL/PLATELET
Basophils Absolute: 0.1 10*3/uL (ref 0.0–0.2)
Basos: 1 %
EOS (ABSOLUTE): 0.2 10*3/uL (ref 0.0–0.4)
Eos: 2 %
Hematocrit: 39.9 % (ref 37.5–51.0)
Hemoglobin: 13.2 g/dL (ref 13.0–17.7)
Immature Grans (Abs): 0 10*3/uL (ref 0.0–0.1)
Immature Granulocytes: 0 %
Lymphocytes Absolute: 4.3 10*3/uL — ABNORMAL HIGH (ref 0.7–3.1)
Lymphs: 59 %
MCH: 25.9 pg — ABNORMAL LOW (ref 26.6–33.0)
MCHC: 33.1 g/dL (ref 31.5–35.7)
MCV: 78 fL — ABNORMAL LOW (ref 79–97)
Monocytes Absolute: 0.7 10*3/uL (ref 0.1–0.9)
Monocytes: 10 %
Neutrophils Absolute: 2 10*3/uL (ref 1.4–7.0)
Neutrophils: 28 %
Platelets: 143 10*3/uL — ABNORMAL LOW (ref 150–450)
RBC: 5.09 x10E6/uL (ref 4.14–5.80)
RDW: 14.2 % (ref 11.6–15.4)
WBC: 7.3 10*3/uL (ref 3.4–10.8)

## 2021-02-24 LAB — LIPID PANEL
Chol/HDL Ratio: 3 ratio (ref 0.0–5.0)
Cholesterol, Total: 108 mg/dL (ref 100–199)
HDL: 36 mg/dL — ABNORMAL LOW (ref >39)
LDL Chol Calc (NIH): 52 mg/dL (ref 0–99)
Triglycerides: 105 mg/dL (ref 0–149)
VLDL Cholesterol Cal: 20 mg/dL (ref 5–40)

## 2021-02-24 LAB — HEMOGLOBIN A1C
Est. average glucose Bld gHb Est-mCnc: 151 mg/dL
Hgb A1c MFr Bld: 6.9 % — ABNORMAL HIGH (ref 4.8–5.6)

## 2021-02-28 ENCOUNTER — Telehealth: Payer: Self-pay

## 2021-02-28 NOTE — Telephone Encounter (Signed)
Pt was called and no vm was left, Information has been sent to nurse pool and letter will be sent.

## 2021-02-28 NOTE — Telephone Encounter (Signed)
-----   Message from Storm Frisk, MD sent at 02/25/2021  7:05 AM EST ----- Let pt know A1c 6.9 is at goal. Liver kidney cholesterol blood counts all ok

## 2021-03-04 ENCOUNTER — Other Ambulatory Visit: Payer: Self-pay

## 2021-03-04 ENCOUNTER — Ambulatory Visit: Payer: No Typology Code available for payment source | Attending: Critical Care Medicine | Admitting: Pharmacist

## 2021-03-04 VITALS — BP 157/81

## 2021-03-04 DIAGNOSIS — I10 Essential (primary) hypertension: Secondary | ICD-10-CM

## 2021-03-04 NOTE — Progress Notes (Signed)
   S: PCP: Dr. Delford Field     Patient arrives in good spirits. Presents to the clinic for hypertension management. Patient was referred by Dr. Delford Field on 02/23/2021.  BP 174/92 at that visit.   Patient reports adherence with medications, but admits that he has not taken any BP medication in the past two days. He uses a pill box and I fill this for him weekly. He consistently misses his PM dose of carvedilol and metformin but takes all other medications in the morning.   Denies chest pain, shortness of breath, HA, or blurred vision.  Current BP Medications include:  Amlodipine 10, carvedilol 25 mg BID, furosemide 40 mg daily, losartan 100 mg daily  Dietary habits include: drinks ~1 cup of coffee daily, denies adding salt to foods Exercise habits include: does not exercise d/t knee pain Family / Social history: DM (father); never smoker; denies alcohol intake   Home BP readings: does not take at home  O:  Vitals:   03/04/21 1411  BP: (!) 157/81   Last 3 Office BP readings: BP Readings from Last 3 Encounters:  03/04/21 (!) 157/81  02/23/21 (!) 174/92  09/09/20 (!) 167/91    BMET    Component Value Date/Time   NA 138 02/23/2021 1125   K 3.6 02/23/2021 1125   CL 101 02/23/2021 1125   CO2 25 02/23/2021 1125   GLUCOSE 148 (H) 02/23/2021 1125   GLUCOSE 107 (H) 06/01/2020 0931   BUN 10 02/23/2021 1125   CREATININE 1.11 02/23/2021 1125   CALCIUM 10.7 (H) 02/23/2021 1125   CALCIUM 11.0 (H) 02/12/2018 1237   GFRNONAA >60 06/01/2020 0931   GFRAA 53 (L) 03/04/2020 1231   Renal function: CrCl cannot be calculated (Unknown ideal weight.).  Clinical ASCVD: No  The ASCVD Risk score (Arnett DK, et al., 2019) failed to calculate for the following reasons:   The valid total cholesterol range is 130 to 320 mg/dL  A/P: Hypertension longstanding currently uncontrolled but improved since seeing PCP. BP Goal <130/80 mmHg. Patient's adherence is suboptimal. After a lengthy conversation he  agrees to do a better job of taking his PM doses.  Will continue current regimen for now.   -Continued current regimen.  -Counseled on lifestyle modifications for blood pressure control including reduced dietary sodium, increased exercise, adequate sleep  Results reviewed and written information provided.  Total time in face-to-face counseling 15 minutes.   F/U Clinic Visit with me in 1 week.    Butch Penny, PharmD, [place], CPP Clinical Pharmacist [facility] [phone]

## 2021-03-11 ENCOUNTER — Ambulatory Visit: Payer: No Typology Code available for payment source | Attending: Critical Care Medicine | Admitting: Pharmacist

## 2021-03-11 ENCOUNTER — Other Ambulatory Visit: Payer: Self-pay

## 2021-03-11 DIAGNOSIS — Z79899 Other long term (current) drug therapy: Secondary | ICD-10-CM

## 2021-03-11 NOTE — Progress Notes (Signed)
Patient presents today with medications and a pill box requesting for me to fill his pill box. He has all of his medications with him that we have in his profile. He is doing better with compliance. His evening pill boxes are empty today which has not been the case in the past. I encouraged compliance and commended patient for this improvement. Will plan on checking BP at next visit.   Time spent counseling: 15 minutes  Follow-up: as needed  Butch Penny, PharmD, [place], CPP Clinical Pharmacist [facility] [phone]

## 2021-03-18 ENCOUNTER — Ambulatory Visit: Payer: No Typology Code available for payment source | Attending: Critical Care Medicine | Admitting: Pharmacist

## 2021-03-18 ENCOUNTER — Other Ambulatory Visit: Payer: Self-pay

## 2021-03-18 VITALS — BP 153/76

## 2021-03-18 DIAGNOSIS — Z79899 Other long term (current) drug therapy: Secondary | ICD-10-CM

## 2021-03-18 DIAGNOSIS — I10 Essential (primary) hypertension: Secondary | ICD-10-CM | POA: Diagnosis not present

## 2021-03-18 NOTE — Progress Notes (Signed)
° °  S: PCP: Dr. Delford Field     Patient arrives in good spirits. Presents to the clinic for hypertension management. Patient was referred by Dr. Delford Field on 02/23/2021. I've been seeing him to fill his pill boxes.    Patient reports adherence with medications, however, he did not take this morning's doses. He uses a pill box and I fill this for him weekly. He has been doing a better job with adherence overall.   Denies chest pain, shortness of breath, HA, or blurred vision.  Current BP Medications include:  Amlodipine 10, carvedilol 25 mg BID, furosemide 40 mg daily, losartan 100 mg daily  Dietary habits include: drinks ~1 cup of coffee daily, denies adding salt to foods Exercise habits include: does not exercise d/t knee pain Family / Social history: DM (father); never smoker; denies alcohol intake   Home BP readings: does not take at home  O:  Vitals:   03/18/21 1415  BP: (!) 153/76   Last 3 Office BP readings: BP Readings from Last 3 Encounters:  03/18/21 (!) 153/76  03/04/21 (!) 157/81  02/23/21 (!) 174/92    BMET    Component Value Date/Time   NA 138 02/23/2021 1125   K 3.6 02/23/2021 1125   CL 101 02/23/2021 1125   CO2 25 02/23/2021 1125   GLUCOSE 148 (H) 02/23/2021 1125   GLUCOSE 107 (H) 06/01/2020 0931   BUN 10 02/23/2021 1125   CREATININE 1.11 02/23/2021 1125   CALCIUM 10.7 (H) 02/23/2021 1125   CALCIUM 11.0 (H) 02/12/2018 1237   GFRNONAA >60 06/01/2020 0931   GFRAA 53 (L) 03/04/2020 1231   Renal function: CrCl cannot be calculated (Patient's most recent lab result is older than the maximum 21 days allowed.).  Clinical ASCVD: No  The ASCVD Risk score (Arnett DK, et al., 2019) failed to calculate for the following reasons:   The valid total cholesterol range is 130 to 320 mg/dL  A/P: Hypertension longstanding currently uncontrolled. BP Goal <130/80 mmHg. Patient's adherence is suboptimal. He has not taken his medication this morning, hence his BP is elevated. I  asked him to return in 1 week and take his medications the day of that appointment.  -Continued current regimen.  -Counseled on lifestyle modifications for blood pressure control including reduced dietary sodium, increased exercise, adequate sleep  Results reviewed and written information provided.  Total time in face-to-face counseling 15 minutes.   F/U Clinic Visit with me in 1 week.    Butch Penny, PharmD, [place], CPP Clinical Pharmacist [facility] [phone]

## 2021-03-25 ENCOUNTER — Other Ambulatory Visit: Payer: Self-pay

## 2021-03-29 ENCOUNTER — Other Ambulatory Visit: Payer: Self-pay

## 2021-03-30 ENCOUNTER — Telehealth: Payer: Self-pay | Admitting: Critical Care Medicine

## 2021-03-30 NOTE — Telephone Encounter (Signed)
Called pt and he has medication and everything is taken care of.

## 2021-04-08 NOTE — Telephone Encounter (Signed)
Pt called in today wanted to speak with Lurena Joiner regarding his Pill pack, please advise.

## 2021-04-08 NOTE — Telephone Encounter (Signed)
Pt was seen and pill pack was filled.

## 2021-04-13 ENCOUNTER — Telehealth: Payer: Self-pay | Admitting: *Deleted

## 2021-04-13 NOTE — Telephone Encounter (Signed)
Copied from [place] [phone]. Topic: Appointment Scheduling - Scheduling Inquiry for Clinic >> Apr 08, 2021 12:02 PM Oneta Rack wrote: Patient wanted to come in today after 2pm to see Lurena Joiner so he can fill his pill box, patient would like a follow up call.

## 2021-04-13 NOTE — Telephone Encounter (Signed)
Patient has appt scheduled

## 2021-04-15 ENCOUNTER — Ambulatory Visit: Payer: No Typology Code available for payment source | Attending: Critical Care Medicine | Admitting: Pharmacist

## 2021-04-15 ENCOUNTER — Other Ambulatory Visit: Payer: Self-pay

## 2021-04-15 ENCOUNTER — Telehealth: Payer: Self-pay | Admitting: Pharmacist

## 2021-04-15 DIAGNOSIS — Z79899 Other long term (current) drug therapy: Secondary | ICD-10-CM

## 2021-04-15 DIAGNOSIS — I10 Essential (primary) hypertension: Secondary | ICD-10-CM

## 2021-04-15 NOTE — Telephone Encounter (Signed)
Patient is requesting a note from his PCP to excuse him from jury duty. I informed him that I cannot approve this but I will send a note to his doctor. I also informed him that this request may take ~1 week to complete. Will route request to Dr. Delford Field.

## 2021-04-15 NOTE — Progress Notes (Signed)
Patient presents today with medications and a pill box requesting for me to fill his pill box. He has all of his medications with him that we have in his profile. He is doing better with compliance. His evening pill boxes are empty today which has not been the case in the past. I encouraged compliance and commended patient for this improvement. Will plan on checking BP at next visit.   Time spent counseling: 15 minutes  Follow-up: as needed  [name], PharmD, BCACP, CPP Clinical Pharmacist [facility] [phone]  

## 2021-04-18 ENCOUNTER — Encounter: Payer: Self-pay | Admitting: Critical Care Medicine

## 2021-04-18 NOTE — Telephone Encounter (Signed)
Patient called and informed. Thank you!

## 2021-04-18 NOTE — Telephone Encounter (Signed)
[facility] letter produced and put on your desk,  pls call pt to pick up

## 2021-04-20 ENCOUNTER — Telehealth: Payer: Self-pay | Admitting: Critical Care Medicine

## 2021-04-20 ENCOUNTER — Other Ambulatory Visit: Payer: Self-pay

## 2021-04-20 ENCOUNTER — Telehealth: Payer: Self-pay

## 2021-04-20 NOTE — Telephone Encounter (Signed)
Error

## 2021-04-20 NOTE — Telephone Encounter (Signed)
Patient states he was told he was due to pick up medications, but states he doesn't need any and its not at the pharmacy

## 2021-04-20 NOTE — Telephone Encounter (Signed)
Patient needed his pill boxes filled. I filled these for him.

## 2021-04-22 ENCOUNTER — Ambulatory Visit: Payer: No Typology Code available for payment source | Admitting: Pharmacist

## 2021-04-22 ENCOUNTER — Other Ambulatory Visit: Payer: Self-pay

## 2021-05-02 ENCOUNTER — Ambulatory Visit: Payer: No Typology Code available for payment source | Admitting: Pharmacist

## 2021-05-06 ENCOUNTER — Ambulatory Visit: Payer: No Typology Code available for payment source | Admitting: Pharmacist

## 2021-05-13 ENCOUNTER — Ambulatory Visit: Payer: No Typology Code available for payment source | Admitting: Pharmacist

## 2021-05-20 ENCOUNTER — Ambulatory Visit: Payer: No Typology Code available for payment source | Admitting: Pharmacist

## 2021-05-24 ENCOUNTER — Other Ambulatory Visit: Payer: Self-pay

## 2021-05-25 DIAGNOSIS — E119 Type 2 diabetes mellitus without complications: Secondary | ICD-10-CM | POA: Diagnosis not present

## 2021-05-27 ENCOUNTER — Ambulatory Visit: Payer: No Typology Code available for payment source | Admitting: Pharmacist

## 2021-05-31 ENCOUNTER — Other Ambulatory Visit: Payer: Self-pay

## 2021-06-09 ENCOUNTER — Other Ambulatory Visit: Payer: Self-pay

## 2021-06-16 ENCOUNTER — Other Ambulatory Visit: Payer: Self-pay

## 2021-07-08 ENCOUNTER — Other Ambulatory Visit: Payer: Self-pay

## 2021-07-26 ENCOUNTER — Other Ambulatory Visit: Payer: Self-pay | Admitting: Critical Care Medicine

## 2021-07-26 ENCOUNTER — Other Ambulatory Visit: Payer: Self-pay

## 2021-07-26 DIAGNOSIS — E1129 Type 2 diabetes mellitus with other diabetic kidney complication: Secondary | ICD-10-CM

## 2021-07-27 ENCOUNTER — Other Ambulatory Visit: Payer: Self-pay

## 2021-08-03 ENCOUNTER — Other Ambulatory Visit: Payer: Self-pay

## 2021-09-05 ENCOUNTER — Other Ambulatory Visit: Payer: Self-pay

## 2021-09-12 ENCOUNTER — Encounter: Payer: Self-pay | Admitting: Critical Care Medicine

## 2021-09-12 DIAGNOSIS — K7460 Unspecified cirrhosis of liver: Secondary | ICD-10-CM | POA: Insufficient documentation

## 2021-09-12 NOTE — Progress Notes (Incomplete)
Established Patient Office Visit  Subjective:  Patient ID: Austin Vath., male    DOB: [birth year]  Age: 71 y.o. MRN: [id number]  CC:  No chief complaint on file.   HPI 02/2021 Austin Mercer. presents for primary care follow-up visit  Patient continues to complain of right knee pain and he decided not to go with the [facility] system for care.  He would like an orthopedic referral.  Patient's been taking chronic tramadol for the knee pain.  He is under a pain contract at this time.  Also on arrival blood pressure elevated 174/92 and he has not been compliant with his evening doses of blood pressure medicines  He denies any shortness of breath or chest pain.  His biggest complaint is focused on the right knee.  General review of his dietary habits shows he takes quite a bit of salt in the diet and is noncompliant with diabetic restrictions  6/13 foot a1c  Past Medical History:  Diagnosis Date  . Diabetes ([place])   . Hypertension   . Hypertensive urgency 03/04/2020    Past Surgical History:  Procedure Laterality Date  . ANKLE FRACTURE SURGERY      Family History  Problem Relation Age of Onset  . Diabetes Father     Social History   Socioeconomic History  . Marital status: Divorced    Spouse name: Not on file  . Number of children: 2  . Years of education: Not on file  . Highest education level: Not on file  Occupational History  . Occupation: retired  Tobacco Use  . Smoking status: Former    Types: Cigarettes    Quit date: 1973    Years since quitting: 50.4  . Smokeless tobacco: Never  Vaping Use  . Vaping Use: Never used  Substance and Sexual Activity  . Alcohol use: Yes    Comment: Beer, Red wine  . Drug use: Not Currently  . Sexual activity: Not on file  Other Topics Concern  . Not on file  Social History Narrative  . Not on file   Social Determinants of Health   Financial Resource Strain: Not on file  Food Insecurity: Not on file  Transportation  Needs: Not on file  Physical Activity: Not on file  Stress: Not on file  Social Connections: Not on file  Intimate Partner Violence: Not on file    Outpatient Medications Prior to Visit  Medication Sig Dispense Refill  . Accu-Chek Softclix Lancets lancets Use as directed to test blood sugar once daily. DX: E11.9 100 each 2  . amLODipine (NORVASC) 10 MG tablet Take 1 tablet (10 mg total) by mouth daily. 90 tablet 1  . atorvastatin (LIPITOR) 10 MG tablet Take 1 tablet (10 mg total) by mouth daily. 90 tablet 1  . Blood Glucose Monitoring Suppl (ACCU-CHEK GUIDE) w/Device KIT 1 each by Does not apply route daily. Use as directed to test blood sugar once daily. DX: E11.9 1 kit 0  . Blood Pressure Monitoring (BLOOD PRESSURE MONITOR 7) DEVI Use to monitor blood pressure. (Patient not taking: Reported on 09/09/2020) 1 each 0  . canagliflozin (INVOKANA) 300 MG TABS tablet Take 1 tablet (300 mg total) by mouth daily before breakfast. 30 tablet 4  . carvedilol (COREG) 25 MG tablet TAKE 1 TABLET (25 MG TOTAL) BY MOUTH 2 (TWO) TIMES DAILY WITH A MEAL. 120 tablet 3  . furosemide (LASIX) 40 MG tablet TAKE 1 TABLET (40 MG TOTAL) BY MOUTH DAILY. 90 tablet  2  . losartan (COZAAR) 100 MG tablet Take 1 tablet (100 mg total) by mouth daily. 90 tablet 2  . metFORMIN (GLUCOPHAGE) 500 MG tablet Take 1 tablet (500 mg total) by mouth 2 (two) times daily with a meal. 180 tablet 1  . potassium chloride (KLOR-CON) 8 MEQ tablet Take 2 tablets (16 mEq total) by mouth 2 (two) times daily. 120 tablet 6  . tadalafil (CIALIS) 20 MG tablet Take 1 tablet by mouth 1 hour prior to sex as needed.  Limit use to 1 tab/24 hr 10 tablet 1   No facility-administered medications prior to visit.    Allergies  Allergen Reactions  . Latex Itching    ROS Review of Systems  Constitutional: Negative.   HENT: Negative.  Negative for ear pain, postnasal drip, rhinorrhea, sinus pressure, sore throat, trouble swallowing and voice change.    Eyes: Negative.   Respiratory: Negative.  Negative for apnea, cough, choking, chest tightness, shortness of breath, wheezing and stridor.   Cardiovascular: Negative.  Negative for chest pain, palpitations and leg swelling.  Gastrointestinal: Negative.  Negative for abdominal distention, abdominal pain, nausea and vomiting.  Genitourinary: Negative.   Musculoskeletal:  Positive for gait problem and joint swelling. Negative for arthralgias and myalgias.       Chronic right knee pain  Skin: Negative.  Negative for rash.  Allergic/Immunologic: Negative.  Negative for environmental allergies and food allergies.  Neurological:  Negative for dizziness, syncope, weakness and headaches.  Hematological: Negative.  Negative for adenopathy. Does not bruise/bleed easily.  Psychiatric/Behavioral: Negative.  Negative for agitation and sleep disturbance. The patient is not nervous/anxious.       Objective:    Physical Exam Vitals reviewed.  Constitutional:      Appearance: Normal appearance. He is well-developed. He is obese. He is not diaphoretic.  HENT:     Head: Normocephalic and atraumatic.     Nose: No nasal deformity, septal deviation, mucosal edema or rhinorrhea.     Right Sinus: No maxillary sinus tenderness or frontal sinus tenderness.     Left Sinus: No maxillary sinus tenderness or frontal sinus tenderness.     Mouth/Throat:     Pharynx: No oropharyngeal exudate.  Eyes:     General: No scleral icterus.    Conjunctiva/sclera: Conjunctivae normal.     Pupils: Pupils are equal, round, and reactive to light.  Neck:     Thyroid: No thyromegaly.     Vascular: No carotid bruit or JVD.     Trachea: Trachea normal. No tracheal tenderness or tracheal deviation.  Cardiovascular:     Rate and Rhythm: Normal rate and regular rhythm.     Chest Wall: PMI is not displaced.     Pulses: Normal pulses. No decreased pulses.     Heart sounds: Normal heart sounds, S1 normal and S2 normal. Heart sounds  not distant. No murmur heard.    No systolic murmur is present.     No diastolic murmur is present.     No friction rub. No gallop. No S3 or S4 sounds.  Pulmonary:     Effort: Pulmonary effort is normal. No tachypnea, accessory muscle usage or respiratory distress.     Breath sounds: Normal breath sounds. No stridor. No decreased breath sounds, wheezing, rhonchi or rales.  Chest:     Chest wall: No tenderness.  Abdominal:     General: Bowel sounds are normal. There is no distension.     Palpations: Abdomen is soft. Abdomen is not  rigid.     Tenderness: There is no abdominal tenderness. There is no guarding or rebound.  Musculoskeletal:        General: Tenderness present.     Cervical back: Normal range of motion and neck supple. No edema, erythema or rigidity. No muscular tenderness. Normal range of motion.     Comments: Decreased range of motion tenderness on right knee  Lymphadenopathy:     Head:     Right side of head: No submental or submandibular adenopathy.     Left side of head: No submental or submandibular adenopathy.     Cervical: No cervical adenopathy.  Skin:    General: Skin is warm and dry.     Coloration: Skin is not pale.     Findings: No rash.     Nails: There is no clubbing.  Neurological:     General: No focal deficit present.     Mental Status: He is alert and oriented to person, place, and time. Mental status is at baseline.     Sensory: No sensory deficit.  Psychiatric:        Mood and Affect: Mood normal.        Speech: Speech normal.        Behavior: Behavior normal.        Thought Content: Thought content normal.        Judgment: Judgment normal.    There were no vitals taken for this visit. Wt Readings from Last 3 Encounters:  02/23/21 (!) 330 lb 9.6 oz (150 kg)  09/09/20 (!) 317 lb (143.8 kg)  06/28/20 (!) 335 lb (152 kg)     Health Maintenance Due  Topic Date Due  . OPHTHALMOLOGY EXAM  Never done  . Zoster Vaccines- Shingrix (2 of 2)  10/24/2017  . COVID-19 Vaccine (4 - Booster for Moderna series) 05/31/2020  . FOOT EXAM  04/21/2021  . HEMOGLOBIN A1C  08/23/2021    There are no preventive care reminders to display for this patient.  Lab Results  Component Value Date   TSH 3.060 11/27/2017   Lab Results  Component Value Date   WBC 7.3 02/23/2021   HGB 13.2 02/23/2021   HCT 39.9 02/23/2021   MCV 78 (L) 02/23/2021   PLT 143 (L) 02/23/2021   Lab Results  Component Value Date   NA 138 02/23/2021   K 3.6 02/23/2021   CO2 25 02/23/2021   GLUCOSE 148 (H) 02/23/2021   BUN 10 02/23/2021   CREATININE 1.11 02/23/2021   BILITOT 0.4 02/23/2021   ALKPHOS 54 02/23/2021   AST 15 02/23/2021   ALT 15 02/23/2021   PROT 7.1 02/23/2021   ALBUMIN 4.1 02/23/2021   CALCIUM 10.7 (H) 02/23/2021   ANIONGAP 9 06/01/2020   EGFR 72 02/23/2021   Lab Results  Component Value Date   CHOL 108 02/23/2021   Lab Results  Component Value Date   HDL 36 (L) 02/23/2021   Lab Results  Component Value Date   LDLCALC 52 02/23/2021   Lab Results  Component Value Date   TRIG 105 02/23/2021   Lab Results  Component Value Date   CHOLHDL 3.0 02/23/2021   Lab Results  Component Value Date   HGBA1C 6.9 (H) 02/23/2021      Assessment & Plan:   Problem List Items Addressed This Visit   None  No orders of the defined types were placed in this encounter. Referral to ophthalmology made for eye exam for diabetes  Follow-up: No  follow-ups on file.    Asencion Noble, MD

## 2021-09-13 ENCOUNTER — Other Ambulatory Visit: Payer: Self-pay

## 2021-09-13 ENCOUNTER — Ambulatory Visit: Payer: Medicare PPO | Attending: Critical Care Medicine | Admitting: Critical Care Medicine

## 2021-09-13 ENCOUNTER — Telehealth: Payer: Self-pay

## 2021-09-13 ENCOUNTER — Other Ambulatory Visit: Payer: Self-pay | Admitting: Pharmacist

## 2021-09-13 ENCOUNTER — Encounter: Payer: Self-pay | Admitting: Critical Care Medicine

## 2021-09-13 VITALS — BP 175/84 | HR 65 | Wt 353.2 lb

## 2021-09-13 DIAGNOSIS — N529 Male erectile dysfunction, unspecified: Secondary | ICD-10-CM

## 2021-09-13 DIAGNOSIS — K7030 Alcoholic cirrhosis of liver without ascites: Secondary | ICD-10-CM | POA: Diagnosis not present

## 2021-09-13 DIAGNOSIS — F331 Major depressive disorder, recurrent, moderate: Secondary | ICD-10-CM

## 2021-09-13 DIAGNOSIS — I5022 Chronic systolic (congestive) heart failure: Secondary | ICD-10-CM

## 2021-09-13 DIAGNOSIS — E1129 Type 2 diabetes mellitus with other diabetic kidney complication: Secondary | ICD-10-CM | POA: Diagnosis not present

## 2021-09-13 DIAGNOSIS — R809 Proteinuria, unspecified: Secondary | ICD-10-CM

## 2021-09-13 DIAGNOSIS — F1190 Opioid use, unspecified, uncomplicated: Secondary | ICD-10-CM

## 2021-09-13 DIAGNOSIS — I10 Essential (primary) hypertension: Secondary | ICD-10-CM | POA: Diagnosis not present

## 2021-09-13 DIAGNOSIS — E119 Type 2 diabetes mellitus without complications: Secondary | ICD-10-CM | POA: Diagnosis not present

## 2021-09-13 DIAGNOSIS — E66813 Obesity, class 3: Secondary | ICD-10-CM

## 2021-09-13 DIAGNOSIS — Z6841 Body Mass Index (BMI) 40.0 and over, adult: Secondary | ICD-10-CM

## 2021-09-13 LAB — POCT GLYCOSYLATED HEMOGLOBIN (HGB A1C): HbA1c, POC (controlled diabetic range): 7.2 % — AB (ref 0.0–7.0)

## 2021-09-13 LAB — GLUCOSE, POCT (MANUAL RESULT ENTRY): POC Glucose: 170 mg/dl — AB (ref 70–99)

## 2021-09-13 MED ORDER — ACCU-CHEK GUIDE W/DEVICE KIT
1.0000 | PACK | Freq: Every day | 0 refills | Status: DC
  Filled 2021-09-13: qty 1, 30d supply, fill #0

## 2021-09-13 MED ORDER — CANAGLIFLOZIN 300 MG PO TABS
300.0000 mg | ORAL_TABLET | Freq: Every day | ORAL | 4 refills | Status: DC
  Filled 2021-09-13: qty 30, 30d supply, fill #0

## 2021-09-13 MED ORDER — ATORVASTATIN CALCIUM 10 MG PO TABS
10.0000 mg | ORAL_TABLET | Freq: Every day | ORAL | 1 refills | Status: DC
  Filled 2021-09-13 – 2021-11-02 (×2): qty 90, 90d supply, fill #0
  Filled 2022-03-28: qty 90, 90d supply, fill #1

## 2021-09-13 MED ORDER — ACCU-CHEK SOFTCLIX LANCETS MISC
2 refills | Status: DC
Start: 2021-09-13 — End: 2022-06-06
  Filled 2021-09-13: qty 100, 90d supply, fill #0

## 2021-09-13 MED ORDER — SPIRONOLACTONE 25 MG PO TABS
25.0000 mg | ORAL_TABLET | Freq: Every day | ORAL | 2 refills | Status: DC
  Filled 2021-09-13: qty 90, 90d supply, fill #0
  Filled 2022-01-09: qty 90, 90d supply, fill #1
  Filled 2022-05-04: qty 90, 90d supply, fill #2

## 2021-09-13 MED ORDER — DAPAGLIFLOZIN PROPANEDIOL 10 MG PO TABS
10.0000 mg | ORAL_TABLET | Freq: Every day | ORAL | 2 refills | Status: DC
  Filled 2021-09-13: qty 30, 30d supply, fill #0
  Filled 2021-11-02: qty 30, 30d supply, fill #1
  Filled 2021-11-30: qty 60, 60d supply, fill #1

## 2021-09-13 MED ORDER — POTASSIUM CHLORIDE ER 8 MEQ PO TBCR
16.0000 meq | EXTENDED_RELEASE_TABLET | Freq: Two times a day (BID) | ORAL | 6 refills | Status: DC
  Filled 2021-09-13: qty 120, 30d supply, fill #0
  Filled 2021-11-02: qty 120, 30d supply, fill #1
  Filled 2021-12-12 – 2021-12-21 (×2): qty 120, 30d supply, fill #2
  Filled 2022-01-31: qty 120, 30d supply, fill #3
  Filled 2022-02-27: qty 120, 30d supply, fill #4
  Filled 2022-03-28 – 2022-04-27 (×2): qty 120, 30d supply, fill #5
  Filled 2022-05-31: qty 120, 30d supply, fill #6

## 2021-09-13 MED ORDER — FUROSEMIDE 40 MG PO TABS
ORAL_TABLET | Freq: Every day | ORAL | 2 refills | Status: DC
  Filled 2021-09-13: qty 90, fill #0

## 2021-09-13 MED ORDER — TADALAFIL 20 MG PO TABS
ORAL_TABLET | ORAL | 1 refills | Status: DC
  Filled 2021-09-13: qty 10, 30d supply, fill #0
  Filled 2021-10-14 – 2021-10-18 (×3): qty 10, 30d supply, fill #1

## 2021-09-13 MED ORDER — METFORMIN HCL 500 MG PO TABS
500.0000 mg | ORAL_TABLET | Freq: Two times a day (BID) | ORAL | 1 refills | Status: DC
  Filled 2021-09-13: qty 180, 90d supply, fill #0
  Filled 2022-01-20: qty 180, 90d supply, fill #1

## 2021-09-13 MED ORDER — LOSARTAN POTASSIUM 100 MG PO TABS
100.0000 mg | ORAL_TABLET | Freq: Every day | ORAL | 2 refills | Status: DC
  Filled 2021-09-13: qty 90, 90d supply, fill #0
  Filled 2022-02-20 – 2022-02-27 (×2): qty 90, 90d supply, fill #1
  Filled 2022-05-31: qty 90, 90d supply, fill #2

## 2021-09-13 MED ORDER — CARVEDILOL 25 MG PO TABS
ORAL_TABLET | Freq: Two times a day (BID) | ORAL | 3 refills | Status: DC
  Filled 2021-09-13: qty 60, 30d supply, fill #0
  Filled 2021-11-02: qty 60, 30d supply, fill #1
  Filled 2021-12-12: qty 60, 30d supply, fill #2
  Filled 2022-01-31: qty 60, 30d supply, fill #3
  Filled 2022-02-27: qty 60, 30d supply, fill #4
  Filled 2022-03-28: qty 60, 30d supply, fill #5
  Filled 2022-05-04 – 2022-05-31 (×2): qty 60, 30d supply, fill #6

## 2021-09-13 MED ORDER — AMLODIPINE BESYLATE 10 MG PO TABS
10.0000 mg | ORAL_TABLET | Freq: Every day | ORAL | 1 refills | Status: DC
  Filled 2021-09-13: qty 90, 90d supply, fill #0

## 2021-09-13 NOTE — Assessment & Plan Note (Signed)
Prescribed Cialis for Erectile Dysfunction. Will Continue to monitor.

## 2021-09-13 NOTE — Telephone Encounter (Signed)
Thanks for the info.  The pharmD student did fill his organizer with his visit this am ?before you saw him nevertheless his numbers here do suggest med non adherence as you have seen  Austin Mercer : please reach out to this pt for a HTN clinic visit with you in next 2-3 weeks , I will see him thereafter

## 2021-09-13 NOTE — Patient Instructions (Signed)
Start Aldactone 1 pill daily for elevated blood pressure  No other medication changes  Labs today include metabolic panel and urine for protein  Again watch your salt intake with your diet  Please return to have your pill organizer processed as it runs out do not let the medications run out even 1 day  Return to Dr. Delford Field 4 weeks

## 2021-09-13 NOTE — Assessment & Plan Note (Signed)
Went over with patient dietary changes needed

## 2021-09-13 NOTE — Telephone Encounter (Signed)
Patient came in today to get assistance filling his medication organizer but none of his medication had been taken from the previous week.  Each week he comes in for assistance and he only takes his morning doses.  Just wanted provider to be aware.  Pharmacists have spoken/educated patient numerous times in regards to the importance of taking his medication as prescribed.  He comes in each week for the pharmacist to fill his pill organizer but still is struggling with compliance.

## 2021-09-13 NOTE — Telephone Encounter (Signed)
Hey friend,   Are we able to get this patient in to see me within the next months?

## 2021-09-13 NOTE — Assessment & Plan Note (Addendum)
Hemogloblin A1C today 7.2  Given new glucose meter to record sugars.  Counseled patient on the importance of medications adherence, monitoring blood sugar, and dietary lifestyle changes.  No changes to medications.  Urinalysis and Renal Function checked today.

## 2021-09-13 NOTE — Assessment & Plan Note (Addendum)
Liver Function Tests unremarkable. Will Continue to monitor.  Advised alcohol abstinence

## 2021-09-13 NOTE — Assessment & Plan Note (Signed)
Have not filled tramadol since April 2022 Will monitor

## 2021-09-13 NOTE — Assessment & Plan Note (Addendum)
Hypertension 175/84 today. Poorly controlled due to nonadhering to medications. Patient has connected with clinical pharmacist for weekly pill organizer.  According to the pharmacy downstairs he is not been taking some of his morning medication when they do his pill organizer the following week the morning doses are not been used  Continue current medications.  Added Aldactone to regiment.  Check CMP for potassium levels at next follow up. At Home Blood Pressure cuff given to monitor.

## 2021-09-13 NOTE — Progress Notes (Signed)
Established Patient Office Visit  Subjective   Patient ID: Austin Mercer., male    DOB: [birth year]  Age: 71 y.o. MRN: [id number]  Chief Complaint  Patient presents with   Foot Swelling   Diabetes   Medication Refill   Patient Presents today for follow up.  Austin Mercer is a 71 year old male with a history of Hypertension, Type 2 Diabetes, Chronic Systolic Heart Failure, and alcoholic cirrhosis presents to the clinic with foot swelling. Patient states he noticed the swelling this past week. Denies trauma, chest pain, shortness of breath. He states he has trouble adhering to his medications and on a weekly basis comes into the clinic to organize his pill container. Admits to occasional alcohol use. Denies tobacco use.  The patient has not been taking some of his morning medicines based on the pharmacy there is been doing his pill organizer they state that every week he comes in the a.m. medications have not been used  Blood Pressure today 178/84. Hemoglobin A1C 7.2. Patient did not take his medications today. Patient does not regularly check his blood sugar at home. He states he lost his glucose meter. I stressed the importance of medication adherence and checking his blood sugar to improve his chronic conditions.   Requesting medication refills.  Will need to check Kidney function and obtain urine for microalbuminuria.  Past Medical History:  Diagnosis Date   Diabetes ([place])    Hypertension    Hypertensive urgency 03/04/2020     Family History  Problem Relation Age of Onset   Diabetes Father      Social History   Socioeconomic History   Marital status: Divorced    Spouse name: Not on file   Number of children: 2   Years of education: Not on file   Highest education level: Not on file  Occupational History   Occupation: retired  Tobacco Use   Smoking status: Former    Types: Cigarettes    Quit date: 1973    Years since quitting: 50.4   Smokeless tobacco: Never  Vaping  Use   Vaping Use: Never used  Substance and Sexual Activity   Alcohol use: Yes    Comment: Beer, Red wine   Drug use: Not Currently   Sexual activity: Not on file  Other Topics Concern   Not on file  Social History Narrative   Not on file   Social Determinants of Health   Financial Resource Strain: Not on file  Food Insecurity: Not on file  Transportation Needs: Not on file  Physical Activity: Not on file  Stress: Not on file  Social Connections: Not on file  Intimate Partner Violence: Not on file     Allergies  Allergen Reactions   Latex Itching     Outpatient Medications Prior to Visit  Medication Sig Dispense Refill   amLODipine (NORVASC) 10 MG tablet Take 1 tablet (10 mg total) by mouth daily. 90 tablet 1   atorvastatin (LIPITOR) 10 MG tablet Take 1 tablet (10 mg total) by mouth daily. 90 tablet 1   carvedilol (COREG) 25 MG tablet TAKE 1 TABLET (25 MG TOTAL) BY MOUTH 2 (TWO) TIMES DAILY WITH A MEAL. 120 tablet 3   furosemide (LASIX) 40 MG tablet TAKE 1 TABLET (40 MG TOTAL) BY MOUTH DAILY. 90 tablet 2   loratadine (CLARITIN) 10 MG tablet Take 1 tablet by mouth daily.     losartan (COZAAR) 100 MG tablet Take 1 tablet (100 mg total) by  mouth daily. 90 tablet 2   metFORMIN (GLUCOPHAGE) 500 MG tablet Take 1 tablet (500 mg total) by mouth 2 (two) times daily with a meal. 180 tablet 1   potassium chloride (KLOR-CON) 8 MEQ tablet Take 2 tablets (16 mEq total) by mouth 2 (two) times daily. 120 tablet 6   tadalafil (CIALIS) 20 MG tablet Take 1 tablet by mouth 1 hour prior to sex as needed.  Limit use to 1 tab/24 hr 10 tablet 1   Blood Pressure Monitoring (BLOOD PRESSURE MONITOR 7) DEVI Use to monitor blood pressure. (Patient not taking: Reported on 09/09/2020) 1 each 0   Accu-Chek Softclix Lancets lancets Use as directed to test blood sugar once daily. DX: E11.9 100 each 2   aspirin 81 MG chewable tablet CHEW ONE TABLET BY MOUTH EVERY DAY (Patient not taking: Reported on 09/13/2021)      Blood Glucose Monitoring Suppl (ACCU-CHEK GUIDE) w/Device KIT 1 each by Does not apply route daily. Use as directed to test blood sugar once daily. DX: E11.9 1 kit 0   canagliflozin (INVOKANA) 300 MG TABS tablet Take 1 tablet (300 mg total) by mouth daily before breakfast. (Patient not taking: Reported on 09/13/2021) 30 tablet 4   No facility-administered medications prior to visit.    Review of Systems  Constitutional: Negative.  Negative for chills, diaphoresis, fever, malaise/fatigue and weight loss.       Weight gain is significant from edema  HENT: Negative.  Negative for congestion, ear discharge, ear pain, hearing loss, nosebleeds, sore throat and tinnitus.   Eyes: Negative.  Negative for blurred vision, double vision, photophobia and discharge.  Respiratory: Negative.  Negative for cough, hemoptysis, sputum production, shortness of breath, wheezing and stridor.        No excess mucus  Cardiovascular:  Positive for leg swelling. Negative for chest pain, palpitations, orthopnea, claudication and PND.  Gastrointestinal: Negative.  Negative for abdominal pain, blood in stool, constipation, diarrhea, heartburn, melena, nausea and vomiting.  Genitourinary: Negative.  Negative for dysuria, flank pain, frequency, hematuria and urgency.  Musculoskeletal:  Positive for joint pain (knee). Negative for back pain, falls, myalgias and neck pain.  Skin: Negative.  Negative for itching and rash.  Neurological: Negative.  Negative for dizziness, tingling, tremors, sensory change, speech change, focal weakness, seizures, loss of consciousness, weakness and headaches.  Endo/Heme/Allergies: Negative.  Negative for environmental allergies and polydipsia. Does not bruise/bleed easily.  Psychiatric/Behavioral:  Positive for depression. Negative for hallucinations, memory loss, substance abuse and suicidal ideas. The patient is not nervous/anxious and does not have insomnia.   All other systems reviewed and  are negative.     Objective:     BP (!) 175/84   Pulse 65   Wt (!) 353 lb 3.2 oz (160.2 kg)   SpO2 97%   BMI 46.60 kg/m  BP Readings from Last 3 Encounters:  09/13/21 (!) 175/84  03/18/21 (!) 153/76  03/04/21 (!) 157/81   Wt Readings from Last 3 Encounters:  09/13/21 (!) 353 lb 3.2 oz (160.2 kg)  02/23/21 (!) 330 lb 9.6 oz (150 kg)  09/09/20 (!) 317 lb (143.8 kg)      Physical Exam Vitals reviewed.  Constitutional:      Appearance: Normal appearance. He is well-developed. He is obese. He is not diaphoretic.  HENT:     Head: Normocephalic and atraumatic.     Nose: No nasal deformity, septal deviation, mucosal edema or rhinorrhea.     Right Sinus: No maxillary sinus  tenderness or frontal sinus tenderness.     Left Sinus: No maxillary sinus tenderness or frontal sinus tenderness.     Mouth/Throat:     Mouth: Mucous membranes are moist.     Pharynx: Oropharynx is clear. No oropharyngeal exudate.  Eyes:     General: No scleral icterus.    Conjunctiva/sclera: Conjunctivae normal.     Pupils: Pupils are equal, round, and reactive to light.  Neck:     Thyroid: No thyromegaly.     Vascular: No carotid bruit or JVD.     Trachea: Trachea normal. No tracheal tenderness or tracheal deviation.  Cardiovascular:     Rate and Rhythm: Normal rate and regular rhythm.     Chest Wall: PMI is not displaced.     Pulses: Normal pulses. No decreased pulses.     Heart sounds: Normal heart sounds, S1 normal and S2 normal. Heart sounds not distant. No murmur heard.    No systolic murmur is present.     No diastolic murmur is present.     No friction rub. No gallop. No S3 or S4 sounds.  Pulmonary:     Effort: Pulmonary effort is normal. No tachypnea, accessory muscle usage or respiratory distress.     Breath sounds: Normal breath sounds. No stridor. No decreased breath sounds, wheezing, rhonchi or rales.  Chest:     Chest wall: No tenderness.  Abdominal:     General: Bowel sounds are  normal. There is distension.     Palpations: Abdomen is soft. Abdomen is not rigid.     Tenderness: There is no abdominal tenderness. There is no guarding or rebound.     Comments: Excess fluid on abdomen  Musculoskeletal:        General: Normal range of motion.     Cervical back: Normal range of motion and neck supple. No edema, erythema or rigidity. No muscular tenderness. Normal range of motion.     Right lower leg: 2+ Pitting Edema present.     Left lower leg: 2+ Pitting Edema present.  Lymphadenopathy:     Head:     Right side of head: No submental or submandibular adenopathy.     Left side of head: No submental or submandibular adenopathy.     Cervical: No cervical adenopathy.  Skin:    General: Skin is warm and dry.     Coloration: Skin is not pale.     Findings: No rash.     Nails: There is no clubbing.  Neurological:     Mental Status: He is alert and oriented to person, place, and time.     Sensory: No sensory deficit.  Psychiatric:        Speech: Speech normal.        Behavior: Behavior normal.      Results for orders placed or performed in visit on 09/13/21  POCT glucose (manual entry)  Result Value Ref Range   POC Glucose 170 (A) 70 - 99 mg/dl  POCT glycosylated hemoglobin (Hb A1C)  Result Value Ref Range   Hemoglobin A1C     HbA1c POC (<> result, manual entry)     HbA1c, POC (prediabetic range)     HbA1c, POC (controlled diabetic range) 7.2 (A) 0.0 - 7.0 %    Last CBC Lab Results  Component Value Date   WBC 7.3 02/23/2021   HGB 13.2 02/23/2021   HCT 39.9 02/23/2021   MCV 78 (L) 02/23/2021   MCH 25.9 (L) 02/23/2021  RDW 14.2 02/23/2021   PLT 143 (L) 60/60/0459   Last metabolic panel Lab Results  Component Value Date   GLUCOSE 148 (H) 02/23/2021   NA 138 02/23/2021   K 3.6 02/23/2021   CL 101 02/23/2021   CO2 25 02/23/2021   BUN 10 02/23/2021   CREATININE 1.11 02/23/2021   EGFR 72 02/23/2021   CALCIUM 10.7 (H) 02/23/2021   PHOS 2.7  02/12/2018   PROT 7.1 02/23/2021   ALBUMIN 4.1 02/23/2021   LABGLOB 3.0 02/23/2021   AGRATIO 1.4 02/23/2021   BILITOT 0.4 02/23/2021   ALKPHOS 54 02/23/2021   AST 15 02/23/2021   ALT 15 02/23/2021   ANIONGAP 9 06/01/2020   Last lipids Lab Results  Component Value Date   CHOL 108 02/23/2021   HDL 36 (L) 02/23/2021   LDLCALC 52 02/23/2021   TRIG 105 02/23/2021   CHOLHDL 3.0 02/23/2021   Last hemoglobin A1c Lab Results  Component Value Date   HGBA1C 7.2 (A) 09/13/2021   Last thyroid functions Lab Results  Component Value Date   TSH 3.060 11/27/2017   Last vitamin D Lab Results  Component Value Date   VD25OH 28.4 (L) 11/23/2017   Last vitamin B12 and Folate No results found for: "VITAMINB12", "FOLATE"  The ASCVD Risk score (Arnett DK, et al., 2019) failed to calculate for the following reasons:   The valid total cholesterol range is 130 to 320 mg/dL    Assessment & Plan:   Problem List Items Addressed This Visit       Cardiovascular and Mediastinum   Essential hypertension    Hypertension 175/84 today. Poorly controlled due to nonadhering to medications. Patient has connected with clinical pharmacist for weekly pill organizer.  According to the pharmacy downstairs he is not been taking some of his morning medication when they do his pill organizer the following week the morning doses are not been used  Continue current medications.  Added Aldactone to regiment.  Check CMP for potassium levels at next follow up. At Home Blood Pressure cuff given to monitor.      Relevant Medications   amLODipine (NORVASC) 10 MG tablet   atorvastatin (LIPITOR) 10 MG tablet   carvedilol (COREG) 25 MG tablet   losartan (COZAAR) 100 MG tablet   potassium chloride (KLOR-CON) 8 MEQ tablet   tadalafil (CIALIS) 20 MG tablet   furosemide (LASIX) 40 MG tablet   spironolactone (ALDACTONE) 25 MG tablet   Chronic systolic heart failure (HCC)    Physical Exam revealed 2+ Pitting Edema  bilaterally. Added aldactone. Continue Current Medications.  Will Continue to monitor and refer to cardiology if conditions worsens.      Relevant Medications   amLODipine (NORVASC) 10 MG tablet   atorvastatin (LIPITOR) 10 MG tablet   carvedilol (COREG) 25 MG tablet   losartan (COZAAR) 100 MG tablet   tadalafil (CIALIS) 20 MG tablet   furosemide (LASIX) 40 MG tablet   spironolactone (ALDACTONE) 25 MG tablet     Digestive   Liver cirrhosis (HCC)    Liver Function Tests unremarkable. Will Continue to monitor.  Advised alcohol abstinence        Endocrine   Controlled type 2 diabetes mellitus without complication, without long-term current use of insulin (HCC)    Hemogloblin A1C today 7.2  Given new glucose meter to record sugars.  Counseled patient on the importance of medications adherence, monitoring blood sugar, and dietary lifestyle changes.  No changes to medications.  Urinalysis and Renal Function  checked today.      Relevant Medications   atorvastatin (LIPITOR) 10 MG tablet   losartan (COZAAR) 100 MG tablet   metFORMIN (GLUCOPHAGE) 500 MG tablet   Blood Glucose Monitoring Suppl (ACCU-CHEK GUIDE) w/Device KIT     Other   Class 3 severe obesity due to excess calories with serious comorbidity and body mass index (BMI) of 45.0 to 49.9 in adult [facility])    Went over with patient dietary changes needed      Relevant Medications   metFORMIN (GLUCOPHAGE) 500 MG tablet   Chronic, continuous use of opioids    Have not filled tramadol since April 2022 Will monitor      Positive for macroalbuminuria    Reassess urine for microalbumin      Erectile dysfunction of organic origin    Prescribed Cialis for Erectile Dysfunction. Will Continue to monitor.      Relevant Medications   tadalafil (CIALIS) 20 MG tablet   Moderate episode of recurrent major depressive disorder ([place])   Other Visit Diagnoses     Controlled type 2 diabetes mellitus with microalbuminuria, without  long-term current use of insulin (HCC)    -  Primary   Relevant Medications   atorvastatin (LIPITOR) 10 MG tablet   losartan (COZAAR) 100 MG tablet   metFORMIN (GLUCOPHAGE) 500 MG tablet   Other Relevant Orders   POCT glucose (manual entry) (Completed)   POCT glycosylated hemoglobin (Hb A1C) (Completed)   Urine microalbumin-creatinine with uACR   Renal function panel with eGFR   Class 3 severe obesity due to excess calories with serious comorbidity and body mass index (BMI) of 40.0 to 44.9 in adult [facility])   (Chronic)     Relevant Medications   metFORMIN (GLUCOPHAGE) 500 MG tablet      38 minutes spent in excess time needed for patient education and collaboration with clinical pharmacy Return in about 4 weeks (around 10/11/2021).    Asencion Noble, MD

## 2021-09-13 NOTE — Assessment & Plan Note (Signed)
Reassess urine for microalbumin

## 2021-09-13 NOTE — Assessment & Plan Note (Signed)
Physical Exam revealed 2+ Pitting Edema bilaterally. Added aldactone. Continue Current Medications.  Will Continue to monitor and refer to cardiology if conditions worsens.

## 2021-09-14 ENCOUNTER — Other Ambulatory Visit: Payer: Self-pay | Admitting: Critical Care Medicine

## 2021-09-14 ENCOUNTER — Other Ambulatory Visit: Payer: Self-pay

## 2021-09-14 ENCOUNTER — Other Ambulatory Visit: Payer: Self-pay | Admitting: Pharmacist

## 2021-09-14 ENCOUNTER — Telehealth: Payer: Self-pay | Admitting: Pharmacist

## 2021-09-14 LAB — RENAL FUNCTION PANEL
Albumin: 4 g/dL (ref 3.8–4.8)
BUN/Creatinine Ratio: 6 — ABNORMAL LOW (ref 10–24)
BUN: 7 mg/dL — ABNORMAL LOW (ref 8–27)
CO2: 22 mmol/L (ref 20–29)
Calcium: 10.9 mg/dL — ABNORMAL HIGH (ref 8.6–10.2)
Chloride: 101 mmol/L (ref 96–106)
Creatinine, Ser: 1.17 mg/dL (ref 0.76–1.27)
Glucose: 149 mg/dL — ABNORMAL HIGH (ref 70–99)
Phosphorus: 2.2 mg/dL — ABNORMAL LOW (ref 2.8–4.1)
Potassium: 3.6 mmol/L (ref 3.5–5.2)
Sodium: 136 mmol/L (ref 134–144)
eGFR: 68 mL/min/{1.73_m2} (ref >59)

## 2021-09-14 MED ORDER — K-PHOS 500 MG PO TABS
500.0000 mg | ORAL_TABLET | Freq: Once | ORAL | 0 refills | Status: DC
  Filled 2021-09-14: qty 1, 1d supply, fill #0

## 2021-09-14 MED ORDER — K-PHOS 500 MG PO TABS
500.0000 mg | ORAL_TABLET | Freq: Every day | ORAL | 0 refills | Status: DC
  Filled 2021-09-14 (×2): qty 30, 30d supply, fill #0

## 2021-09-14 NOTE — Progress Notes (Signed)
Call pt for lab appt for calcium level and parathyroid level.   Shanon Brow PA student will call the pt first, wait to call pt until tomorrow.  Also his phosphorous level is low I sent phosphorous dose to pharmacy

## 2021-09-14 NOTE — Telephone Encounter (Signed)
Dr. Delford Field,   Pt's insurance will not cover [facility]. He would have to pa $45 for a 30 day supply. Is there another phos PO option that you would consider for him?

## 2021-09-15 ENCOUNTER — Telehealth: Payer: Self-pay

## 2021-09-15 ENCOUNTER — Other Ambulatory Visit: Payer: Self-pay

## 2021-09-15 NOTE — Telephone Encounter (Signed)
-----   Message from Storm Frisk, MD sent at 09/14/2021 11:04 AM EDT ----- Call pt for lab appt for calcium level and parathyroid level.   Austin Hua PA student will call the pt first, wait to call pt until tomorrow.  Also his phosphorous level is low I sent phosphorous dose to pharmacy

## 2021-09-15 NOTE — Telephone Encounter (Signed)
Pt was called and vm was left, Information has been sent to nurse pool.   

## 2021-09-16 ENCOUNTER — Other Ambulatory Visit: Payer: Self-pay

## 2021-09-19 ENCOUNTER — Other Ambulatory Visit: Payer: Self-pay

## 2021-09-26 ENCOUNTER — Other Ambulatory Visit: Payer: Self-pay

## 2021-09-28 ENCOUNTER — Other Ambulatory Visit: Payer: Self-pay

## 2021-10-06 ENCOUNTER — Other Ambulatory Visit: Payer: Self-pay

## 2021-10-14 ENCOUNTER — Other Ambulatory Visit: Payer: Self-pay

## 2021-10-14 ENCOUNTER — Other Ambulatory Visit: Payer: Self-pay | Admitting: Pharmacist

## 2021-10-17 NOTE — Chronic Care Management (AMB) (Addendum)
Patient seen by Otila Back, PharmD Candidate on 10/14/21 while they were picking up prescriptions at [facility] at [facility].   Blood pressure today was : 184/101 (first reading) HR: 68; 178/95 (second reading) HR: 68   Patient has an automated home blood pressure machine. Patient reports having not used the monitor yet.   Medication review was performed. They are not taking medications as prescribed. He reports forgetting to take medications some of the time - especially during the evenings. He goes to the pharmacy every week to get his medications packaged for him.   The following barriers to adherence were noted:  - Forgetting to take medications  - Questions/Concerns about medications. Patient was unaware of which medications of his were for blood pressure.   The following interventions were completed:  - Medications were reviewed  - Patient was educated on medications, including indication and administration  - Patient was educated on use of adherence strategies, like an alarm for the evenings  - Patient was educated on proper technique to check home blood pressure and reminded to bring home machine and readings to next provider appointment  - Patient was counseled on lifestyle modifications to improve blood pressure   The patient has follow up scheduled:  PCP: Shan Levans, MD on 10/18/2021 at 2:30 PM   Catie TClearance Coots, PharmD, [facility]  [phone]

## 2021-10-18 ENCOUNTER — Other Ambulatory Visit: Payer: Self-pay

## 2021-10-18 ENCOUNTER — Ambulatory Visit: Payer: Medicare PPO | Attending: Critical Care Medicine | Admitting: Critical Care Medicine

## 2021-10-18 ENCOUNTER — Encounter: Payer: Self-pay | Admitting: Critical Care Medicine

## 2021-10-18 VITALS — BP 163/84 | HR 64 | Wt 360.0 lb

## 2021-10-18 DIAGNOSIS — E119 Type 2 diabetes mellitus without complications: Secondary | ICD-10-CM | POA: Diagnosis not present

## 2021-10-18 DIAGNOSIS — I10 Essential (primary) hypertension: Secondary | ICD-10-CM | POA: Diagnosis not present

## 2021-10-18 DIAGNOSIS — J309 Allergic rhinitis, unspecified: Secondary | ICD-10-CM | POA: Insufficient documentation

## 2021-10-18 DIAGNOSIS — E1129 Type 2 diabetes mellitus with other diabetic kidney complication: Secondary | ICD-10-CM | POA: Diagnosis not present

## 2021-10-18 DIAGNOSIS — R809 Proteinuria, unspecified: Secondary | ICD-10-CM | POA: Diagnosis not present

## 2021-10-18 DIAGNOSIS — I5022 Chronic systolic (congestive) heart failure: Secondary | ICD-10-CM

## 2021-10-18 LAB — GLUCOSE, POCT (MANUAL RESULT ENTRY): POC Glucose: 155 mg/dl — AB (ref 70–99)

## 2021-10-18 MED ORDER — TADALAFIL (PAH) 20 MG PO TABS
ORAL_TABLET | ORAL | 0 refills | Status: DC
  Filled 2021-10-18: qty 10, 30d supply, fill #0

## 2021-10-18 MED ORDER — FUROSEMIDE 40 MG PO TABS
ORAL_TABLET | Freq: Every day | ORAL | 2 refills | Status: DC
  Filled 2021-10-18: qty 90, 90d supply, fill #0
  Filled 2022-02-20 – 2022-02-27 (×2): qty 90, 90d supply, fill #1
  Filled 2022-05-31: qty 90, 90d supply, fill #2

## 2021-10-18 MED ORDER — AMLODIPINE BESYLATE 10 MG PO TABS
10.0000 mg | ORAL_TABLET | Freq: Every day | ORAL | 1 refills | Status: DC
  Filled 2021-10-18: qty 90, 90d supply, fill #0
  Filled 2022-02-20 – 2022-02-27 (×2): qty 90, 90d supply, fill #1

## 2021-10-18 NOTE — Assessment & Plan Note (Signed)
Hypertension poorly controlled patient not adherent to current medical regimen  Encouraged to use his afternoon doses of medications  Refills on furosemide and amlodipine sent to pharmacy  Return in 6 weeks

## 2021-10-18 NOTE — Assessment & Plan Note (Signed)
Reassess phosphorus level and did not feel further potassium phosphate

## 2021-10-18 NOTE — Patient Instructions (Addendum)
Labs today is urine for microalbumin and metabolic panel and phosphorus level  Please remember to take your evening doses of medication and your pill organizer  Refills on furosemide and amlodipine will be sent downstairs to pick up today  Return to Dr. Delford Field again in 6 weeks

## 2021-10-18 NOTE — Assessment & Plan Note (Addendum)
Patient actually was at goal at the last visit with his diabetes no change in medicine  Obtain urine for microalbumin

## 2021-10-18 NOTE — Progress Notes (Signed)
Established Patient Office Visit  Subjective   Patient ID: Austin Berent., male    DOB: 1950/06/06  Age: 71 y.o. MRN: [id number]  Chief Complaint  Patient presents with   Hypertension   Diabetes   Patient Presents today for follow up.  Austin Mercer is a 71 year old male with a history of Hypertension, Type 2 Diabetes, Chronic Systolic Heart Failure, and alcoholic cirrhosis presents to the clinic with foot swelling. Patient states he noticed the swelling this past week. Denies trauma, chest pain, shortness of breath. He states he has trouble adhering to his medications and on a weekly basis comes into the clinic to organize his pill container. Admits to occasional alcohol use. Denies tobacco use.  The patient has not been taking some of his morning medicines based on the pharmacy there is been doing his pill organizer they state that every week he comes in the a.m. medications have not been used  Blood Pressure today 178/84. Hemoglobin A1C 7.2. Patient did not take his medications today. Patient does not regularly check his blood sugar at home. He states he lost his glucose meter. I stressed the importance of medication adherence and checking his blood sugar to improve his chronic conditions.   Requesting medication refills.  Will need to check Kidney function and obtain urine for microalbuminuria.  7/18 Patient seen in return follow-up has had difficulty with being compliant with his blood pressure medications.  He does have a pill organizer but often forgets to take the evening dose.  Patient brings his pill organizer with him today and he has multiple afternoon doses of carvedilol not taken.  On arrival blood pressure is 163/84  Below are documentations when he has been to the pharmacy to pick up medications and blood pressures checked Patient seen by Deborah Chalk Day, PharmD Candidate on 10/14/21 while they were picking up prescriptions at [facility].    Blood pressure today was : 184/101 (first reading) HR: 68; 178/95 (second reading) HR: 68     Patient has an automated home blood pressure machine. Patient reports having not used the monitor yet.   Medication review was performed. They are not taking medications as prescribed. He reports forgetting to take medications some of the time - especially during the evenings. He goes to the pharmacy every week to get his medications packaged for him.   The following barriers to adherence were noted:  - Forgetting to take medications  - Questions/Concerns about medications. Patient was unaware of which medications of his were for blood pressure.   The following interventions were completed:  - Medications were reviewed  - Patient was educated on medications, including indication and administration  - Patient was educated on use of adherence strategies, like an alarm for the evenings  - Patient was educated on proper technique to check home blood pressure and reminded to bring home machine and readings to next provider appointment  - Patient was counseled on lifestyle modifications to improve blood pressure    As noted above the patient has difficulty with medication compliance and has multiple barriers not understanding what his medicines are for and forgetting to take afternoon doses  Past Medical History:  Diagnosis Date   Diabetes ([place])    Hypertension    Hypertensive urgency 03/04/2020     Family History  Problem Relation Age of Onset   Diabetes Father      Social History   Socioeconomic History  Marital status: Divorced    Spouse name: Not on file   Number of children: 2   Years of education: Not on file   Highest education level: Not on file  Occupational History   Occupation: retired  Tobacco Use   Smoking status: Former    Types: Cigarettes    Quit date: 1973    Years since quitting: 50.5   Smokeless tobacco: Never  Vaping Use   Vaping Use: Never  used  Substance and Sexual Activity   Alcohol use: Yes    Comment: Beer, Red wine   Drug use: Not Currently   Sexual activity: Not on file  Other Topics Concern   Not on file  Social History Narrative   Not on file   Social Determinants of Health   Financial Resource Strain: Not on file  Food Insecurity: Not on file  Transportation Needs: Not on file  Physical Activity: Not on file  Stress: Not on file  Social Connections: Not on file  Intimate Partner Violence: Not on file     Allergies  Allergen Reactions   Latex Itching     Outpatient Medications Prior to Visit  Medication Sig Dispense Refill   Accu-Chek Softclix Lancets lancets Use as directed to test blood sugar once daily. 100 each 2   atorvastatin (LIPITOR) 10 MG tablet Take 1 tablet (10 mg total) by mouth once daily. 90 tablet 1   Blood Glucose Monitoring Suppl (ACCU-CHEK GUIDE) w/Device KIT Use as directed to test blood sugar once daily. 1 kit 0   carvedilol (COREG) 25 MG tablet TAKE 1 TABLET (25 MG TOTAL) BY MOUTH 2 (TWO) TIMES DAILY WITH A MEAL. 120 tablet 3   dapagliflozin propanediol (FARXIGA) 10 MG TABS tablet Take 1 tablet (10 mg total) by mouth once daily before breakfast. 30 tablet 2   losartan (COZAAR) 100 MG tablet Take 1 tablet (100 mg total) by mouth once daily. 90 tablet 2   metFORMIN (GLUCOPHAGE) 500 MG tablet Take 1 tablet (500 mg total) by mouth 2 (two) times daily with a meal. 180 tablet 1   potassium chloride (KLOR-CON) 8 MEQ tablet Take 2 tablets (16 mEq total) by mouth 2 (two) times daily. 120 tablet 6   spironolactone (ALDACTONE) 25 MG tablet Take 1 tablet (25 mg total) by mouth once daily. 90 tablet 2   amLODipine (NORVASC) 10 MG tablet Take 1 tablet (10 mg total) by mouth once daily. 90 tablet 1   furosemide (LASIX) 40 MG tablet TAKE 1 TABLET (40 MG TOTAL) BY MOUTH ONCE DAILY. 90 tablet 2   potassium phosphate, monobasic, (K-PHOS) 500 MG tablet Take 1 tablet (500 mg total) by mouth daily. 30  tablet 0   tadalafil (CIALIS) 20 MG tablet Take 1 tablet by mouth once daily 1 hour prior to sex as needed.  Limit use to 1 tab per 24 hours. 10 tablet 1   Blood Pressure Monitoring (BLOOD PRESSURE MONITOR 7) DEVI Use to monitor blood pressure. (Patient not taking: Reported on 09/09/2020) 1 each 0   No facility-administered medications prior to visit.    Review of Systems  Constitutional: Negative.  Negative for chills, diaphoresis, fever, malaise/fatigue and weight loss.       Weight gain is significant from edema  HENT: Negative.  Negative for congestion, ear discharge, ear pain, hearing loss, nosebleeds, sore throat and tinnitus.   Eyes: Negative.  Negative for blurred vision, double vision, photophobia and discharge.  Respiratory: Negative.  Negative for cough, hemoptysis,  sputum production, shortness of breath, wheezing and stridor.        No excess mucus  Cardiovascular:  Negative for chest pain, palpitations, orthopnea, claudication, leg swelling and PND.  Gastrointestinal: Negative.  Negative for abdominal pain, blood in stool, constipation, diarrhea, heartburn, melena, nausea and vomiting.  Genitourinary: Negative.  Negative for dysuria, flank pain, frequency, hematuria and urgency.  Musculoskeletal:  Positive for joint pain (knee). Negative for back pain, falls, myalgias and neck pain.  Skin: Negative.  Negative for itching and rash.  Neurological: Negative.  Negative for dizziness, tingling, tremors, sensory change, speech change, focal weakness, seizures, loss of consciousness, weakness and headaches.  Endo/Heme/Allergies: Negative.  Negative for environmental allergies and polydipsia. Does not bruise/bleed easily.  Psychiatric/Behavioral:  Positive for depression. Negative for hallucinations, memory loss, substance abuse and suicidal ideas. The patient is not nervous/anxious and does not have insomnia.   All other systems reviewed and are negative.     Objective:     BP (!)  163/84   Pulse 64   Wt (!) 360 lb (163.3 kg)   SpO2 94%   BMI 47.50 kg/m  BP Readings from Last 3 Encounters:  10/18/21 (!) 163/84  10/14/21 (!) 178/95  09/13/21 (!) 175/84   Wt Readings from Last 3 Encounters:  10/18/21 (!) 360 lb (163.3 kg)  09/13/21 (!) 353 lb 3.2 oz (160.2 kg)  02/23/21 (!) 330 lb 9.6 oz (150 kg)      Physical Exam Vitals reviewed.  Constitutional:      Appearance: Normal appearance. He is well-developed. He is obese. He is not diaphoretic.  HENT:     Head: Normocephalic and atraumatic.     Nose: No nasal deformity, septal deviation, mucosal edema or rhinorrhea.     Right Sinus: No maxillary sinus tenderness or frontal sinus tenderness.     Left Sinus: No maxillary sinus tenderness or frontal sinus tenderness.     Mouth/Throat:     Mouth: Mucous membranes are moist.     Pharynx: Oropharynx is clear. No oropharyngeal exudate.  Eyes:     General: No scleral icterus.    Conjunctiva/sclera: Conjunctivae normal.     Pupils: Pupils are equal, round, and reactive to light.  Neck:     Thyroid: No thyromegaly.     Vascular: No carotid bruit or JVD.     Trachea: Trachea normal. No tracheal tenderness or tracheal deviation.  Cardiovascular:     Rate and Rhythm: Normal rate and regular rhythm.     Chest Wall: PMI is not displaced.     Pulses: Normal pulses. No decreased pulses.     Heart sounds: Normal heart sounds, S1 normal and S2 normal. Heart sounds not distant. No murmur heard.    No systolic murmur is present.     No diastolic murmur is present.     No friction rub. No gallop. No S3 or S4 sounds.  Pulmonary:     Effort: Pulmonary effort is normal. No tachypnea, accessory muscle usage or respiratory distress.     Breath sounds: Normal breath sounds. No stridor. No decreased breath sounds, wheezing, rhonchi or rales.  Chest:     Chest wall: No tenderness.  Abdominal:     General: Bowel sounds are normal. There is no distension.     Palpations: Abdomen  is soft. Abdomen is not rigid.     Tenderness: There is no abdominal tenderness. There is no guarding or rebound.  Musculoskeletal:  General: Normal range of motion.     Cervical back: Normal range of motion and neck supple. No edema, erythema or rigidity. No muscular tenderness. Normal range of motion.     Right lower leg: No edema.     Left lower leg: No edema.  Lymphadenopathy:     Head:     Right side of head: No submental or submandibular adenopathy.     Left side of head: No submental or submandibular adenopathy.     Cervical: No cervical adenopathy.  Skin:    General: Skin is warm and dry.     Coloration: Skin is not pale.     Findings: No rash.     Nails: There is no clubbing.  Neurological:     Mental Status: He is alert and oriented to person, place, and time.     Sensory: No sensory deficit.  Psychiatric:        Speech: Speech normal.        Behavior: Behavior normal.      Results for orders placed or performed in visit on 10/18/21  POCT glucose (manual entry)  Result Value Ref Range   POC Glucose 155 (A) 70 - 99 mg/dl    Last CBC Lab Results  Component Value Date   WBC 7.3 02/23/2021   HGB 13.2 02/23/2021   HCT 39.9 02/23/2021   MCV 78 (L) 02/23/2021   MCH 25.9 (L) 02/23/2021   RDW 14.2 02/23/2021   PLT 143 (L) 75/17/0017   Last metabolic panel Lab Results  Component Value Date   GLUCOSE 149 (H) 09/13/2021   NA 136 09/13/2021   K 3.6 09/13/2021   CL 101 09/13/2021   CO2 22 09/13/2021   BUN 7 (L) 09/13/2021   CREATININE 1.17 09/13/2021   EGFR 68 09/13/2021   CALCIUM 10.9 (H) 09/13/2021   PHOS 2.2 (L) 09/13/2021   PROT 7.1 02/23/2021   ALBUMIN 4.0 09/13/2021   LABGLOB 3.0 02/23/2021   AGRATIO 1.4 02/23/2021   BILITOT 0.4 02/23/2021   ALKPHOS 54 02/23/2021   AST 15 02/23/2021   ALT 15 02/23/2021   ANIONGAP 9 06/01/2020   Last lipids Lab Results  Component Value Date   CHOL 108 02/23/2021   HDL 36 (L) 02/23/2021   LDLCALC 52  02/23/2021   TRIG 105 02/23/2021   CHOLHDL 3.0 02/23/2021   Last hemoglobin A1c Lab Results  Component Value Date   HGBA1C 7.2 (A) 09/13/2021   Last thyroid functions Lab Results  Component Value Date   TSH 3.060 11/27/2017   Last vitamin D Lab Results  Component Value Date   VD25OH 28.4 (L) 11/23/2017   Last vitamin B12 and Folate No results found for: "VITAMINB12", "FOLATE"  The ASCVD Risk score (Arnett DK, et al., 2019) failed to calculate for the following reasons:   The valid total cholesterol range is 130 to 320 mg/dL    Assessment & Plan:   Problem List Items Addressed This Visit       Cardiovascular and Mediastinum   Essential hypertension - Primary    Hypertension poorly controlled patient not adherent to current medical regimen  Encouraged to use his afternoon doses of medications  Refills on furosemide and amlodipine sent to pharmacy  Return in 6 weeks      Relevant Medications   amLODipine (NORVASC) 10 MG tablet   furosemide (LASIX) 40 MG tablet   Other Relevant Orders   CBS4+HQPR   Chronic systolic heart failure (HCC)   Relevant  Medications   amLODipine (NORVASC) 10 MG tablet   furosemide (LASIX) 40 MG tablet     Endocrine   Controlled type 2 diabetes mellitus without complication, without long-term current use of insulin ([place])    Patient actually was at goal at the last visit with his diabetes no change in medicine  Obtain urine for microalbumin        Other   Hypophosphatemia    Reassess phosphorus level and did not feel further potassium phosphate      Relevant Orders   Phosphorus   Other Visit Diagnoses     Controlled type 2 diabetes mellitus with microalbuminuria, without long-term current use of insulin (HCC)       Relevant Orders   POCT glucose (manual entry) (Completed)   Urine microalbumin-creatinine with uACR   BMP8+eGFR      Return in about 6 weeks (around 11/29/2021).    Asencion Noble, MD

## 2021-10-20 LAB — BMP8+EGFR
BUN/Creatinine Ratio: 11 (ref 10–24)
BUN: 15 mg/dL (ref 8–27)
CO2: 21 mmol/L (ref 20–29)
Calcium: 11.7 mg/dL — ABNORMAL HIGH (ref 8.6–10.2)
Chloride: 103 mmol/L (ref 96–106)
Creatinine, Ser: 1.41 mg/dL — ABNORMAL HIGH (ref 0.76–1.27)
Glucose: 120 mg/dL — ABNORMAL HIGH (ref 70–99)
Potassium: 4.2 mmol/L (ref 3.5–5.2)
Sodium: 137 mmol/L (ref 134–144)
eGFR: 54 mL/min/{1.73_m2} — ABNORMAL LOW (ref >59)

## 2021-10-20 LAB — MICROALBUMIN / CREATININE URINE RATIO
Creatinine, Urine: 29.6 mg/dL
Microalb/Creat Ratio: 979 mg/g creat — ABNORMAL HIGH (ref 0–29)
Microalbumin, Urine: 289.9 ug/mL

## 2021-10-20 LAB — PHOSPHORUS: Phosphorus: 2.9 mg/dL (ref 2.8–4.1)

## 2021-10-21 ENCOUNTER — Other Ambulatory Visit: Payer: Self-pay | Admitting: Critical Care Medicine

## 2021-10-21 ENCOUNTER — Telehealth: Payer: Self-pay

## 2021-10-21 DIAGNOSIS — E1129 Type 2 diabetes mellitus with other diabetic kidney complication: Secondary | ICD-10-CM | POA: Insufficient documentation

## 2021-10-21 NOTE — Telephone Encounter (Signed)
Pt was called and vm was left, Information has been sent to nurse pool.   

## 2021-10-21 NOTE — Progress Notes (Signed)
Let pt know he has protein in urine due to diabetes and his high blood pressure, stay on current medications,  phosphorous ok so no more potassium phosphate needed,  continue other potassium pill

## 2021-10-21 NOTE — Telephone Encounter (Signed)
-----   Message from Storm Frisk, MD sent at 10/21/2021  6:14 AM EDT ----- Let pt know he has protein in urine due to diabetes and his high blood pressure, stay on current medications,  phosphorous ok so no more potassium phosphate needed,  continue other potassium pill

## 2021-11-02 ENCOUNTER — Other Ambulatory Visit: Payer: Self-pay

## 2021-11-11 IMAGING — DX DG CHEST 1V PORT
2 series · 2 of 2 positions shown · non-contrast
Comparison: March 05, 2020

CLINICAL DATA: Shortness of breath

EXAM:
PORTABLE CHEST 1 VIEW

[chest ap (1 of 2)]
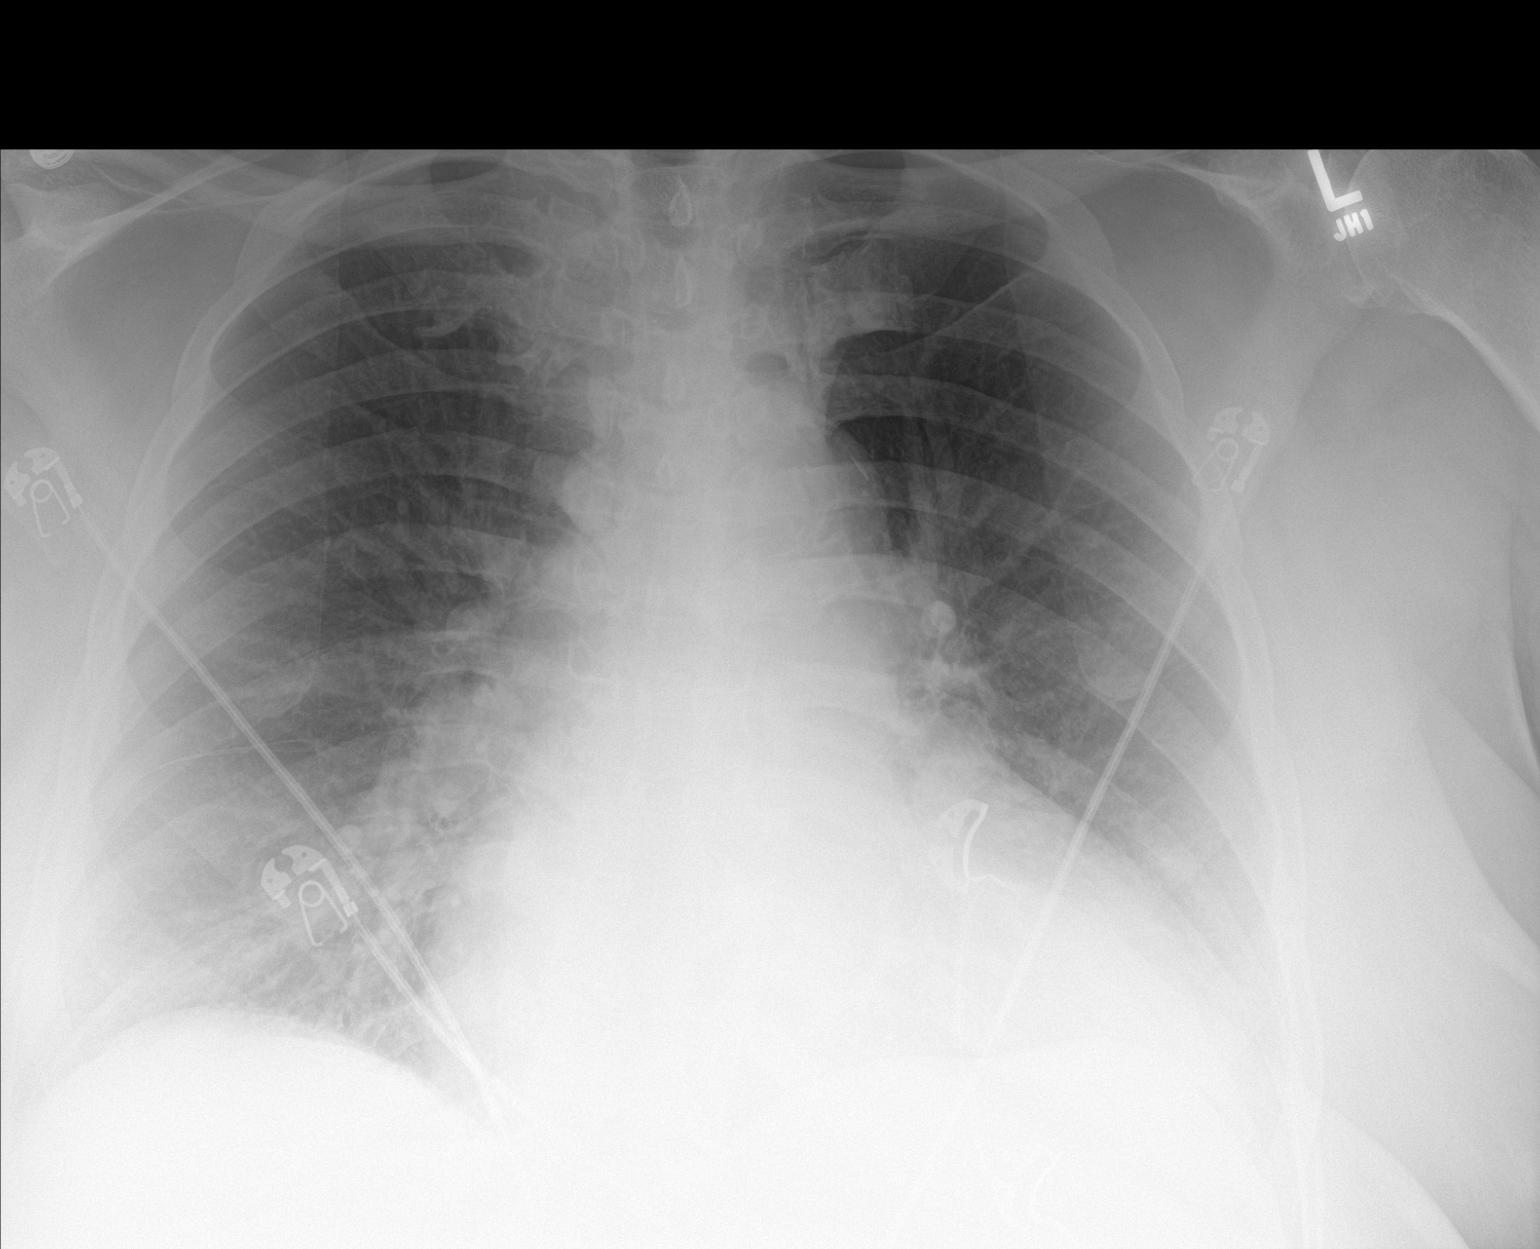

[chest ap (2 of 2)]
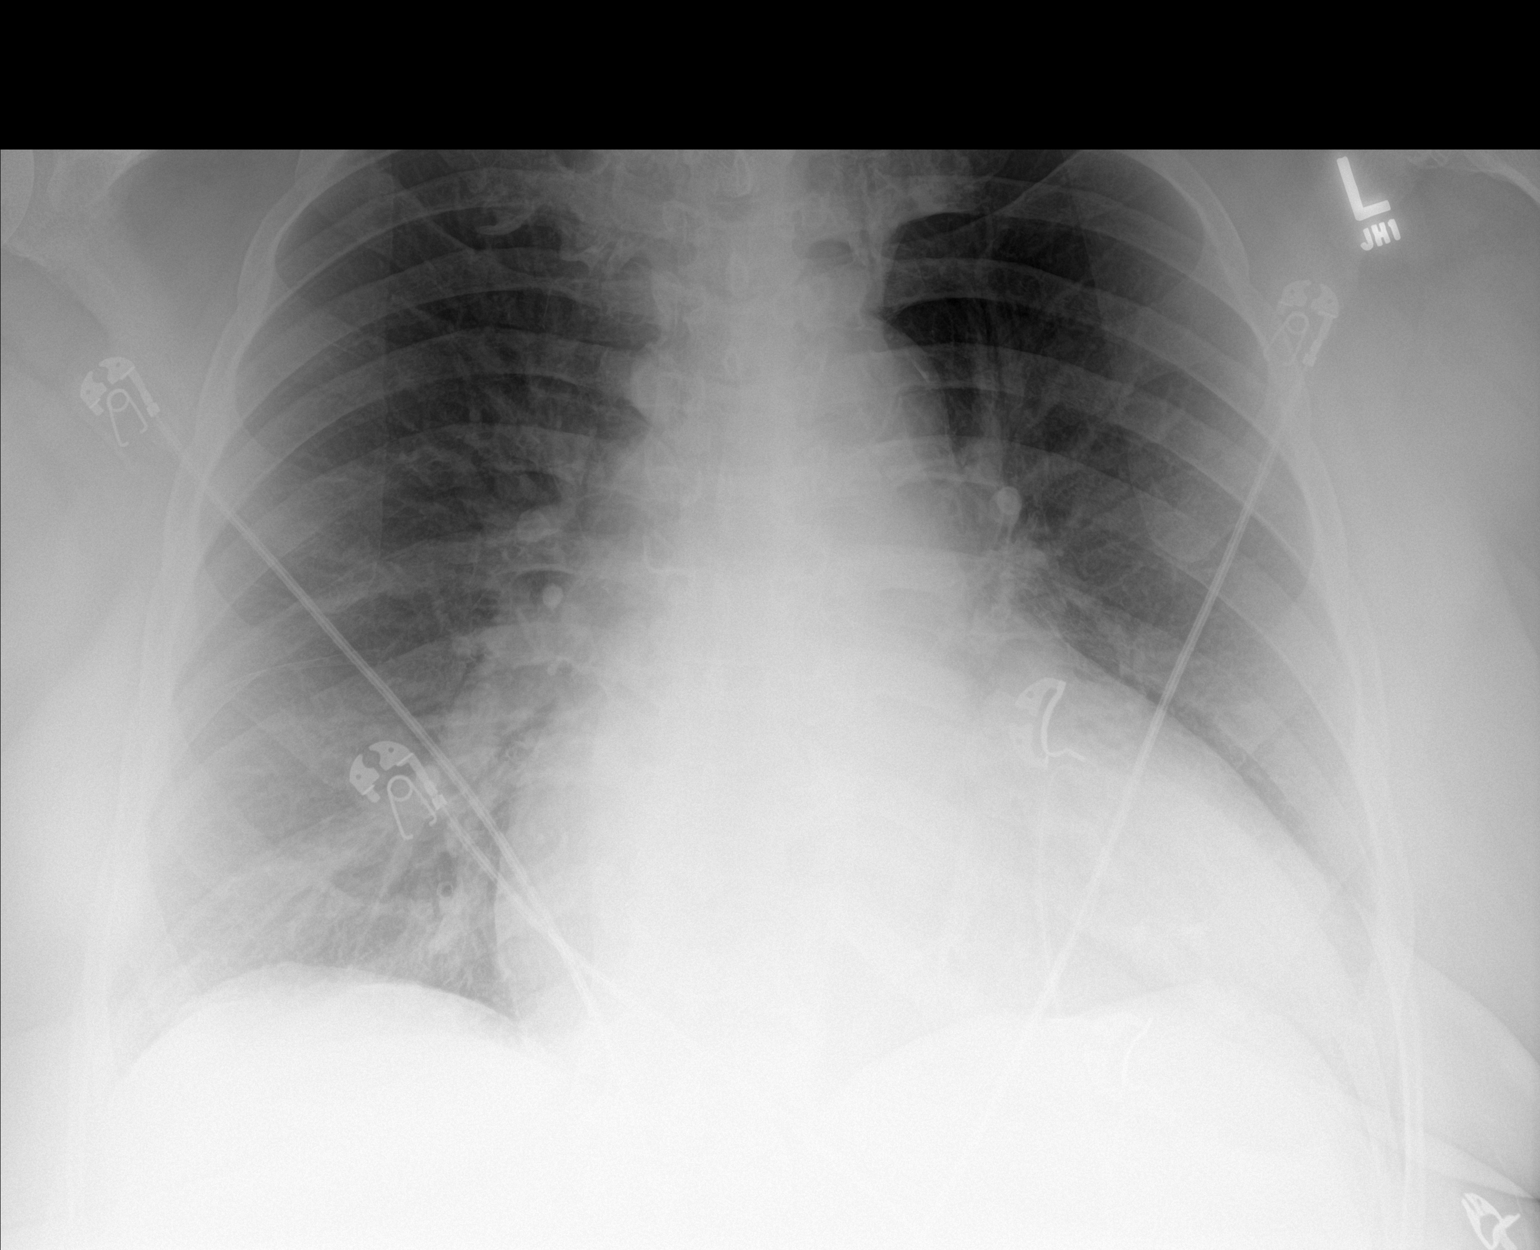

[2 of 2 positions shown; findings below may reference images not displayed]

FINDINGS: The heart size and mediastinal contours are mildly enlarged. There
is mild prominence of the central pulmonary vasculature. No large
airspace consolidation or pleural effusion. The visualized skeletal
structures are unremarkable.
IMPRESSION: Mild pulmonary vascular congestion.

## 2021-11-25 ENCOUNTER — Other Ambulatory Visit: Payer: Self-pay | Admitting: Critical Care Medicine

## 2021-11-25 ENCOUNTER — Other Ambulatory Visit: Payer: Self-pay

## 2021-11-25 MED ORDER — TADALAFIL (PAH) 20 MG PO TABS
ORAL_TABLET | ORAL | 0 refills | Status: DC
  Filled 2021-11-25: qty 10, 30d supply, fill #0

## 2021-11-25 NOTE — Telephone Encounter (Signed)
Requested Prescriptions  Pending Prescriptions Disp Refills  . tadalafil, PAH, (ADCIRCA) 20 MG tablet 10 tablet 0    Sig: take 1 tablet by mouth once daily 1 hour prior to sex as needed. limit use to 1 tab per 24 hours.     Urology: Erectile Dysfunction Agents Failed - 11/25/2021  1:17 PM      Failed - Last BP in normal range    BP Readings from Last 1 Encounters:  10/18/21 (!) 163/84         Passed - AST in normal range and within 360 days    AST  Date Value Ref Range Status  02/23/2021 15 0 - 40 IU/L Final         Passed - ALT in normal range and within 360 days    ALT  Date Value Ref Range Status  02/23/2021 15 0 - 44 IU/L Final         Passed - Valid encounter within last 12 months    Recent Outpatient Visits          1 month ago Essential hypertension   [facility] Storm Frisk, MD   2 months ago Essential hypertension   [facility] Storm Frisk, MD   7 months ago Medication management   [facility] Lois Huxley, Cornelius Moras, RPH-CPP   8 months ago Essential hypertension   [facility] Lois Huxley, Cornelius Moras, RPH-CPP   8 months ago Medication management   [facility] Drucilla Chalet, RPH-CPP

## 2021-11-30 ENCOUNTER — Other Ambulatory Visit: Payer: Self-pay

## 2021-12-01 ENCOUNTER — Other Ambulatory Visit: Payer: Self-pay

## 2021-12-12 ENCOUNTER — Other Ambulatory Visit: Payer: Self-pay

## 2021-12-13 ENCOUNTER — Other Ambulatory Visit: Payer: Self-pay

## 2021-12-19 ENCOUNTER — Other Ambulatory Visit: Payer: Self-pay

## 2021-12-21 ENCOUNTER — Other Ambulatory Visit: Payer: Self-pay

## 2022-01-04 ENCOUNTER — Ambulatory Visit: Payer: Medicare PPO | Attending: Nurse Practitioner | Admitting: Nurse Practitioner

## 2022-01-04 ENCOUNTER — Encounter: Payer: Self-pay | Admitting: Nurse Practitioner

## 2022-01-04 ENCOUNTER — Other Ambulatory Visit: Payer: Self-pay

## 2022-01-04 VITALS — BP 154/74 | HR 65 | Temp 98.0°F | Ht 73.0 in | Wt 361.6 lb

## 2022-01-04 DIAGNOSIS — I10 Essential (primary) hypertension: Secondary | ICD-10-CM | POA: Diagnosis not present

## 2022-01-04 DIAGNOSIS — M1711 Unilateral primary osteoarthritis, right knee: Secondary | ICD-10-CM

## 2022-01-04 DIAGNOSIS — R809 Proteinuria, unspecified: Secondary | ICD-10-CM

## 2022-01-04 DIAGNOSIS — E1129 Type 2 diabetes mellitus with other diabetic kidney complication: Secondary | ICD-10-CM | POA: Diagnosis not present

## 2022-01-04 MED ORDER — TRAMADOL HCL 50 MG PO TABS
50.0000 mg | ORAL_TABLET | Freq: Three times a day (TID) | ORAL | 0 refills | Status: AC | PRN
  Filled 2022-01-04: qty 11, 4d supply, fill #0
  Filled 2022-01-04: qty 15, 5d supply, fill #0
  Filled 2022-01-04: qty 4, 1d supply, fill #0

## 2022-01-04 NOTE — Progress Notes (Signed)
Assessment & Plan:  [name] was seen today for knee pain.  Diagnoses and all orders for this visit:  Primary osteoarthritis of right knee -     traMADol (ULTRAM) 50 MG tablet; Take 1 tablet (50 mg total) by mouth every 8 (eight) hours as needed for up to 5 days. -     Ambulatory referral to Orthopedic Surgery  Essential hypertension -     CMP14+EGFR  Controlled type 2 diabetes mellitus with microalbuminuria, without long-term current use of insulin (HCC) -     Hemoglobin A1c    Patient has been counseled on age-appropriate routine health concerns for screening and prevention. These are reviewed and up-to-date. Referrals have been placed accordingly. Immunizations are up-to-date or declined.    Subjective:   Chief Complaint  Patient presents with   Knee Pain   HPI Austin Mercer. 71 y.o. male presents to office today with complaints of  right knee pain and burning in the left side of his abdomen. Onset of burning was 3 weeks ago and pain only lasts for 10-15  seconds. He takes tylenol "so the pain doesn't come back".    Knee pain He has chronic bilateral knee pain with R>L. Takes tramadol but states he ran out and has not been able to get a refill on this. Needs to follow up with ortho for his knee pain. Will place another referral today. BMI 47 BP Readings from Last 3 Encounters:  01/04/22 (!) 154/74  10/18/21 (!) 163/84  10/14/21 (!) 178/95  XRAY 06-2020 1. Osteoarthritis with medial compartment narrowing. 2. Joint effusion.  HTN Blood pressure is elevated.  He is not adherent with taking his blood pressure medications.  BP Readings from Last 3 Encounters:  01/04/22 (!) 154/74  10/18/21 (!) 163/84  10/14/21 (!) 178/95     A1c  Diabetes not at goal. He is prescribed farxiga 10 mg daily and metformin 500 mg BID.   Lab Results  Component Value Date   HGBA1C 7.2 (A) 09/13/2021    Review of Systems  Constitutional:  Negative for fever, malaise/fatigue and weight  loss.  HENT: Negative.  Negative for nosebleeds.   Eyes: Negative.  Negative for blurred vision, double vision and photophobia.  Respiratory: Negative.  Negative for cough and shortness of breath.   Cardiovascular: Negative.  Negative for chest pain, palpitations and leg swelling.  Gastrointestinal: Negative.  Negative for heartburn, nausea and vomiting.  Musculoskeletal:  Positive for joint pain. Negative for myalgias.  Neurological:  Positive for tingling and sensory change. Negative for dizziness, focal weakness, seizures and headaches.  Psychiatric/Behavioral: Negative.  Negative for suicidal ideas.     Past Medical History:  Diagnosis Date   Diabetes ([place])    Hypertension    Hypertensive urgency 03/04/2020    Past Surgical History:  Procedure Laterality Date   ANKLE FRACTURE SURGERY      Family History  Problem Relation Age of Onset   Diabetes Father     Social History Reviewed with no changes to be made today.   Outpatient Medications Prior to Visit  Medication Sig Dispense Refill   Accu-Chek Softclix Lancets lancets Use as directed to test blood sugar once daily. 100 each 2   amLODipine (NORVASC) 10 MG tablet Take 1 tablet (10 mg total) by mouth once daily. 90 tablet 1   atorvastatin (LIPITOR) 10 MG tablet Take 1 tablet (10 mg total) by mouth once daily. 90 tablet 1   Blood Glucose Monitoring Suppl (ACCU-CHEK GUIDE)  w/Device KIT Use as directed to test blood sugar once daily. 1 kit 0   Blood Pressure Monitoring (BLOOD PRESSURE MONITOR 7) DEVI Use to monitor blood pressure. 1 each 0   carvedilol (COREG) 25 MG tablet TAKE 1 TABLET (25 MG TOTAL) BY MOUTH 2 (TWO) TIMES DAILY WITH A MEAL. 120 tablet 3   dapagliflozin propanediol (FARXIGA) 10 MG TABS tablet Take 1 tablet (10 mg total) by mouth once daily before breakfast. 30 tablet 2   furosemide (LASIX) 40 MG tablet TAKE 1 TABLET (40 MG TOTAL) BY MOUTH ONCE DAILY. 90 tablet 2   losartan (COZAAR) 100 MG tablet Take 1 tablet  (100 mg total) by mouth once daily. 90 tablet 2   metFORMIN (GLUCOPHAGE) 500 MG tablet Take 1 tablet (500 mg total) by mouth 2 (two) times daily with a meal. 180 tablet 1   potassium chloride (KLOR-CON) 8 MEQ tablet Take 2 tablets (16 mEq total) by mouth 2 (two) times daily. 120 tablet 6   spironolactone (ALDACTONE) 25 MG tablet Take 1 tablet (25 mg total) by mouth once daily. 90 tablet 2   tadalafil, PAH, (ADCIRCA) 20 MG tablet take 1 tablet by mouth once daily 1 hour prior to sex as needed. limit use to 1 tab per 24 hours. 10 tablet 0   No facility-administered medications prior to visit.    Allergies  Allergen Reactions   Codeine Other (See Comments)   Latex Itching       Objective:    BP (!) 154/74   Pulse 65   Temp 98 F (36.7 C) (Oral)   Ht 6' 1" (1.854 m)   Wt (!) 361 lb 9.6 oz (164 kg)   SpO2 98%   BMI 47.71 kg/m  Wt Readings from Last 3 Encounters:  01/04/22 (!) 361 lb 9.6 oz (164 kg)  10/18/21 (!) 360 lb (163.3 kg)  09/13/21 (!) 353 lb 3.2 oz (160.2 kg)    Physical Exam Vitals and nursing note reviewed.  Constitutional:      Appearance: He is well-developed.  HENT:     Head: Normocephalic and atraumatic.  Cardiovascular:     Rate and Rhythm: Normal rate and regular rhythm.     Heart sounds: Normal heart sounds. No murmur heard.    No friction rub. No gallop.  Pulmonary:     Effort: Pulmonary effort is normal. No tachypnea or respiratory distress.     Breath sounds: Normal breath sounds. No decreased breath sounds, wheezing, rhonchi or rales.  Chest:     Chest wall: No tenderness.  Abdominal:     General: Bowel sounds are normal.     Palpations: Abdomen is soft.  Musculoskeletal:        General: Normal range of motion.     Cervical back: Normal range of motion.  Skin:    General: Skin is warm and dry.     Findings: No bruising, ecchymosis or rash. Rash is not macular, papular or vesicular.  Neurological:     Mental Status: He is alert and oriented to  person, place, and time.     Coordination: Coordination normal.  Psychiatric:        Behavior: Behavior normal. Behavior is cooperative.        Thought Content: Thought content normal.        Judgment: Judgment normal.          Patient has been counseled extensively about nutrition and exercise as well as the importance of adherence with medications  and regular follow-up. The patient was given clear instructions to go to ER or return to medical center if symptoms don't improve, worsen or new problems develop. The patient verbalized understanding.   Follow-up: Return for f/u with Dr. Joya Gaskins in january.   Gildardo Pounds, FNP-BC [facility] [place] [place], [place]   01/04/2022, 4:10 PM

## 2022-01-05 LAB — CMP14+EGFR
ALT: 18 IU/L (ref 0–44)
AST: 19 IU/L (ref 0–40)
Albumin/Globulin Ratio: 1.1 — ABNORMAL LOW (ref 1.2–2.2)
Albumin: 4 g/dL (ref 3.9–4.9)
Alkaline Phosphatase: 47 IU/L (ref 44–121)
BUN/Creatinine Ratio: 9 — ABNORMAL LOW (ref 10–24)
BUN: 12 mg/dL (ref 8–27)
Bilirubin Total: 0.3 mg/dL (ref 0.0–1.2)
CO2: 23 mmol/L (ref 20–29)
Calcium: 11.1 mg/dL — ABNORMAL HIGH (ref 8.6–10.2)
Chloride: 104 mmol/L (ref 96–106)
Creatinine, Ser: 1.38 mg/dL — ABNORMAL HIGH (ref 0.76–1.27)
Globulin, Total: 3.5 g/dL (ref 1.5–4.5)
Glucose: 150 mg/dL — ABNORMAL HIGH (ref 70–99)
Potassium: 4.3 mmol/L (ref 3.5–5.2)
Sodium: 140 mmol/L (ref 134–144)
Total Protein: 7.5 g/dL (ref 6.0–8.5)
eGFR: 55 mL/min/{1.73_m2} — ABNORMAL LOW (ref >59)

## 2022-01-05 LAB — HEMOGLOBIN A1C
Est. average glucose Bld gHb Est-mCnc: 137 mg/dL
Hgb A1c MFr Bld: 6.4 % — ABNORMAL HIGH (ref 4.8–5.6)

## 2022-01-06 ENCOUNTER — Telehealth: Payer: Self-pay

## 2022-01-06 NOTE — Telephone Encounter (Signed)
Pt was called and no vm was left due to mailbox not being set up.Information was sent to nurse pool.  

## 2022-01-06 NOTE — Telephone Encounter (Signed)
-----   Message from Gildardo Pounds, NP sent at 01/06/2022 11:36 AM EDT ----- Kidney levels improved however still showing a slight decline.  Needs to follow low salt diet do not add salt to your food, eat processed foods, fried foods or eating out unhealthy foods. Calcium is elevated. Needs to return and have the labs drawn Dr. Joya Gaskins ordered in June as soon as possible.

## 2022-01-09 ENCOUNTER — Other Ambulatory Visit: Payer: Self-pay

## 2022-01-12 ENCOUNTER — Emergency Department (HOSPITAL_COMMUNITY): Payer: No Typology Code available for payment source

## 2022-01-12 ENCOUNTER — Emergency Department (HOSPITAL_COMMUNITY)
Admission: EM | Admit: 2022-01-12 | Discharge: 2022-01-12 | Disposition: A | Discharge status: Home / Self Care | Payer: No Typology Code available for payment source | Attending: Emergency Medicine | Admitting: Emergency Medicine

## 2022-01-12 ENCOUNTER — Encounter (HOSPITAL_COMMUNITY): Payer: Self-pay

## 2022-01-12 ENCOUNTER — Other Ambulatory Visit: Payer: Self-pay

## 2022-01-12 DIAGNOSIS — R109 Unspecified abdominal pain: Secondary | ICD-10-CM | POA: Diagnosis present

## 2022-01-12 DIAGNOSIS — Z79899 Other long term (current) drug therapy: Secondary | ICD-10-CM | POA: Diagnosis not present

## 2022-01-12 DIAGNOSIS — R1032 Left lower quadrant pain: Secondary | ICD-10-CM | POA: Diagnosis not present

## 2022-01-12 LAB — URINALYSIS, ROUTINE W REFLEX MICROSCOPIC
Bacteria, UA: NONE SEEN
Bilirubin Urine: NEGATIVE
Glucose, UA: >=500 mg/dL — AB
Hgb urine dipstick: NEGATIVE
Ketones, ur: NEGATIVE mg/dL
Leukocytes,Ua: NEGATIVE
Nitrite: NEGATIVE
Protein, ur: 100 mg/dL — AB
Specific Gravity, Urine: 1.027 (ref 1.005–1.030)
pH: 6 (ref 5.0–8.0)

## 2022-01-12 LAB — CBC WITH DIFFERENTIAL/PLATELET
Abs Immature Granulocytes: 0.01 10*3/uL (ref 0.00–0.07)
Basophils Absolute: 0.1 10*3/uL (ref 0.0–0.1)
Basophils Relative: 1 %
Eosinophils Absolute: 0.1 10*3/uL (ref 0.0–0.5)
Eosinophils Relative: 2 %
HCT: 41 % (ref 39.0–52.0)
Hemoglobin: 13.8 g/dL (ref 13.0–17.0)
Immature Granulocytes: 0 %
Lymphocytes Relative: 56 %
Lymphs Abs: 4 10*3/uL (ref 0.7–4.0)
MCH: 27.5 pg (ref 26.0–34.0)
MCHC: 33.7 g/dL (ref 30.0–36.0)
MCV: 81.7 fL (ref 80.0–100.0)
Monocytes Absolute: 0.7 10*3/uL (ref 0.1–1.0)
Monocytes Relative: 10 %
Neutro Abs: 2.2 10*3/uL (ref 1.7–7.7)
Neutrophils Relative %: 31 %
Platelets: 144 10*3/uL — ABNORMAL LOW (ref 150–400)
RBC: 5.02 MIL/uL (ref 4.22–5.81)
RDW: 16.2 % — ABNORMAL HIGH (ref 11.5–15.5)
WBC: 7.1 10*3/uL (ref 4.0–10.5)
nRBC: 0 % (ref 0.0–0.2)

## 2022-01-12 LAB — COMPREHENSIVE METABOLIC PANEL
ALT: 16 U/L (ref 0–44)
AST: 18 U/L (ref 15–41)
Albumin: 4 g/dL (ref 3.5–5.0)
Alkaline Phosphatase: 39 U/L (ref 38–126)
Anion gap: 6 (ref 5–15)
BUN: 12 mg/dL (ref 8–23)
CO2: 22 mmol/L (ref 22–32)
Calcium: 10.8 mg/dL — ABNORMAL HIGH (ref 8.9–10.3)
Chloride: 108 mmol/L (ref 98–111)
Creatinine, Ser: 1.3 mg/dL — ABNORMAL HIGH (ref 0.61–1.24)
GFR, Estimated: 59 mL/min — ABNORMAL LOW (ref >60)
Glucose, Bld: 121 mg/dL — ABNORMAL HIGH (ref 70–99)
Potassium: 4.1 mmol/L (ref 3.5–5.1)
Sodium: 136 mmol/L (ref 135–145)
Total Bilirubin: 0.6 mg/dL (ref 0.3–1.2)
Total Protein: 8.2 g/dL — ABNORMAL HIGH (ref 6.5–8.1)

## 2022-01-12 LAB — LIPASE, BLOOD: Lipase: 32 U/L (ref 11–51)

## 2022-01-12 MED ORDER — FAMOTIDINE 20 MG PO TABS
20.0000 mg | ORAL_TABLET | Freq: Once | ORAL | Status: AC
  Administered 2022-01-12: 20 mg via ORAL
  Filled 2022-01-12: qty 1

## 2022-01-12 MED ORDER — FAMOTIDINE 20 MG PO TABS
20.0000 mg | ORAL_TABLET | Freq: Every day | ORAL | 0 refills | Status: DC
  Filled 2022-01-12: qty 30, 30d supply, fill #0

## 2022-01-12 MED ORDER — ALUM & MAG HYDROXIDE-SIMETH 200-200-20 MG/5ML PO SUSP
15.0000 mL | Freq: Once | ORAL | Status: AC
  Administered 2022-01-12: 15 mL via ORAL
  Filled 2022-01-12: qty 30

## 2022-01-12 MED ORDER — IOHEXOL 350 MG/ML SOLN
75.0000 mL | Freq: Once | INTRAVENOUS | Status: AC | PRN
  Administered 2022-01-12: 75 mL via INTRAVENOUS

## 2022-01-12 NOTE — ED Provider Notes (Signed)
[facility] EMERGENCY DEPARTMENT Provider Note   CSN: [id number] Arrival date & time: 01/12/22  1355     History  Chief Complaint  Patient presents with   Abdominal Pain    Austin Mercer. is a 71 y.o. male.   Abdominal Pain  Patient is presenting to the emergency department today with left-sided abdominal pain.  Patient states that the pains been intermittent in nature for the past 2 weeks.  Patient states that the pain resolves after the bowel movement.  He notes that the pain was worse last night and is not having any active abdominal pain.  Patient denies any nausea or vomiting.  He feels that the pain is secondary to him eating "bad [place] food".  He states that he is not having diarrhea, dysuria, flank pain, fevers, chills, chest pain, shortness of breath.    Home Medications Prior to Admission medications   Medication Sig Start Date End Date Taking? Authorizing Provider  famotidine (PEPCID) 20 MG tablet Take 1 tablet (20 mg total) by mouth daily. 01/12/22  Yes Ekaterina Denise, [place], MD  Accu-Chek Softclix Lancets lancets Use as directed to test blood sugar once daily. 09/13/21   [name], MD  amLODipine (NORVASC) 10 MG tablet Take 1 tablet (10 mg total) by mouth once daily. 10/18/21   [name], MD  atorvastatin (LIPITOR) 10 MG tablet Take 1 tablet (10 mg total) by mouth once daily. 09/13/21   [name], MD  Blood Glucose Monitoring Suppl (ACCU-CHEK GUIDE) w/Device KIT Use as directed to test blood sugar once daily. 09/13/21   [name], MD  Blood Pressure Monitoring (BLOOD PRESSURE MONITOR 7) DEVI Use to monitor blood pressure. 03/04/20   [name], MD  carvedilol (COREG) 25 MG tablet TAKE 1 TABLET (25 MG TOTAL) BY MOUTH 2 (TWO) TIMES DAILY WITH A MEAL. 09/13/21   [name], MD  dapagliflozin propanediol (FARXIGA) 10 MG TABS tablet Take 1 tablet (10 mg total) by mouth once daily before breakfast. 09/13/21   [name], MD  furosemide (LASIX) 40 MG tablet TAKE 1 TABLET (40 MG TOTAL) BY MOUTH ONCE DAILY. 10/18/21   [name], MD  losartan (COZAAR) 100 MG tablet Take 1 tablet (100 mg total) by mouth once daily. 09/13/21   [name], MD  metFORMIN (GLUCOPHAGE) 500 MG tablet Take 1 tablet (500 mg total) by mouth 2 (two) times daily with a meal. 09/13/21   [name], MD  potassium chloride (KLOR-CON) 8 MEQ tablet Take 2 tablets (16 mEq total) by mouth 2 (two) times daily. 09/13/21   [name], MD  spironolactone (ALDACTONE) 25 MG tablet Take 1 tablet (25 mg total) by mouth once daily. 09/13/21   [name], MD  tadalafil, PAH, (ADCIRCA) 20 MG tablet take 1 tablet by mouth once daily 1 hour prior to sex as needed. limit use to 1 tab per 24 hours. 11/25/21   [name], MD  tadalafil (CIALIS) 20 MG tablet Take 1 tablet by mouth once daily 1 hour prior to sex as needed.  Limit use to 1 tab per 24 hours. 09/13/21 10/18/21  [name], MD      Allergies    Codeine and Latex    Review of Systems   Review of Systems  Gastrointestinal:  Positive for abdominal pain.    Physical Exam Updated Vital Signs BP (!) 186/80   Pulse (!) 55  Temp 98.1 F (36.7 C)   Resp 18   Ht 6' 1"  (1.854 m)   Wt (!) 208.7 kg   SpO2 98%   BMI 60.69 kg/m  Physical Exam Vitals and nursing note reviewed.  Constitutional:      General: He is not in acute distress.    Appearance: He is well-developed. He is obese. He is not ill-appearing or diaphoretic.  HENT:     Head: Normocephalic and atraumatic.  Eyes:     Conjunctiva/sclera: Conjunctivae normal.  Cardiovascular:     Rate and Rhythm: Normal rate and regular rhythm.     Heart sounds: No murmur heard. Pulmonary:     Effort: Pulmonary effort is normal. No respiratory distress.     Breath sounds: Normal breath sounds.  Abdominal:     Palpations: Abdomen is soft.     Tenderness: There is abdominal tenderness in the left  lower quadrant.  Musculoskeletal:        General: No swelling.     Cervical back: Neck supple.  Skin:    General: Skin is warm and dry.     Capillary Refill: Capillary refill takes less than 2 seconds.     Findings: No rash.     Comments: No dermatomal pattern rash  Neurological:     Mental Status: He is alert.  Psychiatric:        Mood and Affect: Mood normal.     ED Results / Procedures / Treatments   Labs (all labs ordered are listed, but only abnormal results are displayed) Labs Reviewed  CBC WITH DIFFERENTIAL/PLATELET - Abnormal; Notable for the following components:      Result Value   RDW 16.2 (*)    Platelets 144 (*)    All other components within normal limits  COMPREHENSIVE METABOLIC PANEL - Abnormal; Notable for the following components:   Glucose, Bld 121 (*)    Creatinine, Ser 1.30 (*)    Calcium 10.8 (*)    Total Protein 8.2 (*)    GFR, Estimated 59 (*)    All other components within normal limits  URINALYSIS, ROUTINE W REFLEX MICROSCOPIC - Abnormal; Notable for the following components:   Glucose, UA >=500 (*)    Protein, ur 100 (*)    All other components within normal limits  LIPASE, BLOOD    EKG None  Radiology CT Abdomen Pelvis W Contrast  Result Date: 01/12/2022 CLINICAL DATA:  LLQ abdominal pain Bowel obstruction suspected. Bowel obstruction suspected., a 71 y.o. male was evaluated in triage. Pt complains of worsening "stomach burning" over the last 2 to 3 weeks. EXAM: CT ABDOMEN AND PELVIS WITH CONTRAST TECHNIQUE: Multidetector CT imaging of the abdomen and pelvis was performed using the standard protocol following bolus administration of intravenous contrast. RADIATION DOSE REDUCTION: This exam was performed according to the departmental dose-optimization program which includes automated exposure control, adjustment of the mA and/or kV according to patient size and/or use of iterative reconstruction technique. CONTRAST:  20m OMNIPAQUE IOHEXOL 350  MG/ML SOLN COMPARISON:  None Available. FINDINGS: Lower chest: No acute abnormality. Hepatobiliary: No focal liver abnormality. No gallstones, gallbladder wall thickening, or pericholecystic fluid. No biliary dilatation. Pancreas: No focal lesion. Normal pancreatic contour. No surrounding inflammatory changes. No main pancreatic ductal dilatation. Spleen: Normal in size without focal abnormality. Adrenals/Urinary Tract: No adrenal nodule bilaterally. Bilateral kidneys enhance symmetrically. No hydronephrosis. No hydroureter. The urinary bladder is unremarkable. Stomach/Bowel: Stomach is within normal limits. No evidence of bowel wall thickening or dilatation.  Colonic diverticulosis. Appendix appears normal. Vascular/Lymphatic: No abdominal aorta or iliac aneurysm. Mild to moderate atherosclerotic plaque of the aorta and its branches. No abdominal, pelvic, or inguinal lymphadenopathy. Reproductive: Prostate is unremarkable. Other: No intraperitoneal free fluid. No intraperitoneal free gas. No organized fluid collection. Musculoskeletal: No abdominal wall hernia or abnormality. No suspicious lytic or blastic osseous lesions. No acute displaced fracture. Multilevel severe degenerative changes of the spine. Bilateral severe osseous neural foraminal stenosis at the L5-S1 level. Bilateral hip degenerative changes. Chronic vertebral body height loss of the L3 through L5 levels. IMPRESSION: 1. Colonic diverticulosis with no acute diverticulitis. 2. Aortic Atherosclerosis (ICD10-I70.0). Electronically Signed   By: Iven Finn M.D.   On: 01/12/2022 19:48    Procedures Procedures    Medications Ordered in ED Medications  iohexol (OMNIPAQUE) 350 MG/ML injection 75 mL (75 mLs Intravenous Contrast Given 01/12/22 1921)  famotidine (PEPCID) tablet 20 mg (20 mg Oral Given 01/12/22 2231)  alum & mag hydroxide-simeth (MAALOX/MYLANTA) 200-200-20 MG/5ML suspension 15 mL (15 mLs Oral Given 01/12/22 2231)    ED Course/  Medical Decision Making/ A&P                          Medical Decision Making Risk OTC drugs.   Medical Decision Making  This patient is Presenting for Evaluation of left lower quadrant abdominal pain, patient has a past medical history type 2 diabetes, hypertension, heart failure, cirrhosis which complicates their presentation.  Of which  require a range of treatment options, and does is a complaint that involves a hig risk of morbidity and mortality.  Arrived in ED by:  EMS History obtained from: The patient  Limitations in history: none    At this time I am most concerned for diverticulitis. Also considering nephrolithiasis, pyelonephritis, gastroenteritis, intra-abdominal abscess, intra-abdominal pathology. Plan for laboratory and imaging study work-up   Laboratory work-up significant for:  -Laboratory work-up obtained from triage reveals an unremarkable CBC, no significant leukocytosis or anemia.  CMP unremarkable, mildly elevated creatinine 1.3, close of his baseline, lipase 32.   Radiologic work-up was significant for: -CT abdomen and pelvis unremarkable, evidence of diverticulosis without diverticulitis, no additional significant intra-abdominal pathology.   Interventions and Interval History: -Patient was generally well on my initial exam, he was in no acute distress and not having significant abdominal pain.  Patient stated that his pain was worse in the evening and therefore patient will be given symptomatic management with Maalox and Pepcid should the patient's abdominal pain be secondary to that of reflux.  Patient's laboratory and imaging study work-up unremarkable, no significant or abdominal pathology noted on CT abdomen and pelvis.  Patient does not have any concerning symptoms for that of systemic infection, he had no evidence of urinary tract infection on urinalysis, he is not having significant ongoing abdominal pain does not have any chest pain or shortness of  breath, additional laboratory and imaging study work-up not felt to clinically indicated this time.  Emergent pathology of the patient's abdominal pain felt to be ruled out with negative CT scan and reassuring laboratory and imaging study work-up.  Patient instructed to follow-up with his PCP within the next week should his symptoms continue.  Patient be prescribed Pepcid outpatient for symptomatic management.    Disposition: Due to the patients current presenting symptoms, physical exam findings, and the workup stated above, it is thought that the etiology of the patients current presentation is reflux   Discharge: Patient is  felt to be medically appropriate for discharge at this time. Patient was informed of all pertinent physical exam, laboratory, and imaging findings. Patients suspected etiology of their symptom presentation was discussed with the patient and all questions were answered. Patient was instructed to follow up with their primary care doctor in 7 days for re-evaluation. Patient was given strict return precautions.    The plan for this patient was discussed with Dr. Maryan Rued, who voiced agreement and who oversaw evaluation and treatment of this patient.     Clinical Complexity  A medically appropriate history, review of systems, and physical exam was performed.   I personally reviewed the lab and imaging studies discussed above.   MDM generated using voice dictation software and may contain dictation errors. Please contact me for any clarification or with any questions.              Final Clinical Impression(s) / ED Diagnoses Final diagnoses:  Left lower quadrant abdominal pain    Rx / DC Orders ED Discharge Orders          Ordered    famotidine (PEPCID) 20 MG tablet  Daily        01/12/22 2233              Avion Patella, [place], MD 01/13/22 Lolly Mustache, MD 01/19/22 1219

## 2022-01-12 NOTE — ED Triage Notes (Signed)
Pt arrived POV w/ c/o L sided abd pain described as burning, and reports it hurts worse after eating. Pt reports pain has been intermittent x 2-3 weeks. Pt in NAD.

## 2022-01-12 NOTE — Discharge Instructions (Signed)
Please return to the emergency department if you develop any new or worsening symptoms, fevers, chills, nausea, vomiting, loss conscious or any other concerns reported.

## 2022-01-12 NOTE — ED Provider Triage Note (Signed)
Emergency Medicine Provider Triage Evaluation Note  Austin Mercer. , a 71 y.o. male  was evaluated in triage.  Pt complains of worsening "stomach burning" over the last 2 to 3 weeks.  Located in the middle and left mid area of the abdomen.  Gets worse at random, tried Pepto-Bismol with no improvement.  Denies N/V/D, constipation, fever, chills, chest pain, or shortness of breath.  No history of prior abdominal surgeries.  Review of Systems  Positive:  Negative: See above  Physical Exam  BP (!) 186/91   Pulse 62   Temp 98.6 F (37 C)   Resp 18   Ht 6\' 1"  (1.854 m)   Wt (!) 208.7 kg   SpO2 99%   BMI 60.69 kg/m  Gen:   Awake, no distress   Resp:  Normal effort  MSK:   Moves extremities without difficulty  Other:  Abdomen soft, non-TTP, protuberant.  Without flank pain bilaterally.  Medical Decision Making  Medically screening exam initiated at 4:38 PM.  Appropriate orders placed.  Austin Mercer. was informed that the remainder of the evaluation will be completed by another provider, this initial triage assessment does not replace that evaluation, and the importance of remaining in the ED until their evaluation is complete.     Austin Rome, PA-C 29/47/65 1644

## 2022-01-13 ENCOUNTER — Other Ambulatory Visit (HOSPITAL_COMMUNITY): Payer: Self-pay

## 2022-01-13 ENCOUNTER — Other Ambulatory Visit: Payer: Self-pay | Admitting: Critical Care Medicine

## 2022-01-13 ENCOUNTER — Other Ambulatory Visit: Payer: Self-pay

## 2022-01-13 DIAGNOSIS — N529 Male erectile dysfunction, unspecified: Secondary | ICD-10-CM

## 2022-01-14 ENCOUNTER — Other Ambulatory Visit (HOSPITAL_BASED_OUTPATIENT_CLINIC_OR_DEPARTMENT_OTHER): Payer: Self-pay

## 2022-01-16 ENCOUNTER — Telehealth: Payer: Self-pay | Admitting: Licensed Clinical Social Worker

## 2022-01-16 NOTE — Patient Outreach (Signed)
  Care Coordination   01/16/2022 Name: Austin Mercer. MRN: [id number] DOB: 08/22/1950   Care Coordination Outreach Attempts:  An unsuccessful telephone outreach was attempted today to offer the patient information about available care coordination services as a benefit of their health plan.   Follow Up Plan:  Additional outreach attempts will be made to offer the patient care coordination information and services.   Encounter Outcome:  No Answer  Care Coordination Interventions Activated:  No   Care Coordination Interventions:  No, not indicated    Christa See, MSW, [email or web address] Phone [phone] 4:20 PM

## 2022-01-17 ENCOUNTER — Other Ambulatory Visit: Payer: Self-pay

## 2022-01-20 ENCOUNTER — Other Ambulatory Visit: Payer: Self-pay

## 2022-01-24 ENCOUNTER — Other Ambulatory Visit: Payer: Self-pay

## 2022-01-31 ENCOUNTER — Other Ambulatory Visit: Payer: Self-pay

## 2022-02-01 NOTE — Progress Notes (Deleted)
Patient ID: Austin Mercer., male   DOB: 1951-02-18, 71 y.o.   MRN: [id number]   After being seen at the ED for LLQ pain 01/12/2022  This patient is Presenting for Evaluation of left lower quadrant abdominal pain, patient has a past medical history type 2 diabetes, hypertension, heart failure, cirrhosis which complicates their presentation.  Of which  require a range of treatment options, and does is a complaint that involves a hig risk of morbidity and mortality.  At this time I am most concerned for diverticulitis. Also considering nephrolithiasis, pyelonephritis, gastroenteritis, intra-abdominal abscess, intra-abdominal pathology. Plan for laboratory and imaging study work-up     Laboratory work-up significant for:  -Laboratory work-up obtained from triage reveals an unremarkable CBC, no significant leukocytosis or anemia.  CMP unremarkable, mildly elevated creatinine 1.3, close of his baseline, lipase 32.     Radiologic work-up was significant for: -CT abdomen and pelvis unremarkable, evidence of diverticulosis without diverticulitis, no additional significant intra-abdominal pathology.     Interventions and Interval History: -Patient was generally well on my initial exam, he was in no acute distress and not having significant abdominal pain.  Patient stated that his pain was worse in the evening and therefore patient will be given symptomatic management with Maalox and Pepcid should the patient's abdominal pain be secondary to that of reflux.  Patient's laboratory and imaging study work-up unremarkable, no significant or abdominal pathology noted on CT abdomen and pelvis.  Patient does not have any concerning symptoms for that of systemic infection, he had no evidence of urinary tract infection on urinalysis, he is not having significant ongoing abdominal pain does not have any chest pain or shortness of breath, additional laboratory and imaging study work-up not felt to clinically indicated  this time.  Emergent pathology of the patient's abdominal pain felt to be ruled out with negative CT scan and reassuring laboratory and imaging study work-up.  Patient instructed to follow-up with his PCP within the next week should his symptoms continue.  Patient be prescribed Pepcid outpatient for symptomatic management.       Disposition: Due to the patients current presenting symptoms, physical exam findings, and the workup stated above, it is thought that the etiology of the patients current presentation is reflux

## 2022-02-02 ENCOUNTER — Inpatient Hospital Stay: Payer: No Typology Code available for payment source | Admitting: Physician Assistant

## 2022-02-02 DIAGNOSIS — R809 Proteinuria, unspecified: Secondary | ICD-10-CM

## 2022-02-13 ENCOUNTER — Other Ambulatory Visit: Payer: Self-pay

## 2022-02-20 ENCOUNTER — Other Ambulatory Visit: Payer: Self-pay

## 2022-02-20 ENCOUNTER — Other Ambulatory Visit: Payer: Self-pay | Admitting: Critical Care Medicine

## 2022-02-20 MED ORDER — DAPAGLIFLOZIN PROPANEDIOL 10 MG PO TABS
10.0000 mg | ORAL_TABLET | Freq: Every day | ORAL | 2 refills | Status: DC
  Filled 2022-02-20 – 2022-02-27 (×3): qty 30, 30d supply, fill #0
  Filled 2022-03-28 – 2022-04-05 (×2): qty 30, 30d supply, fill #1
  Filled 2022-05-04 (×2): qty 30, 30d supply, fill #2

## 2022-02-24 ENCOUNTER — Other Ambulatory Visit: Payer: Self-pay

## 2022-02-27 ENCOUNTER — Other Ambulatory Visit: Payer: Self-pay | Admitting: Critical Care Medicine

## 2022-02-27 ENCOUNTER — Other Ambulatory Visit: Payer: Self-pay

## 2022-02-27 MED ORDER — TADALAFIL (PAH) 20 MG PO TABS
ORAL_TABLET | ORAL | 2 refills | Status: DC
  Filled 2022-02-27: qty 10, 30d supply, fill #0
  Filled 2022-03-28: qty 10, 30d supply, fill #1
  Filled 2022-05-04: qty 10, 30d supply, fill #2

## 2022-02-27 MED ORDER — FAMOTIDINE 20 MG PO TABS
20.0000 mg | ORAL_TABLET | Freq: Every day | ORAL | 2 refills | Status: DC
  Filled 2022-02-27: qty 30, 30d supply, fill #0
  Filled 2022-03-28: qty 30, 30d supply, fill #1
  Filled 2022-05-04 (×2): qty 30, 30d supply, fill #2

## 2022-03-01 NOTE — Progress Notes (Unsigned)
Patient ID: Austin Mercer., male   DOB: [birth year], 71 y.o.   MRN: [id number]   After ED visit 01/12/2022  This patient is Presenting for Evaluation of left lower quadrant abdominal pain, patient has a past medical history type 2 diabetes, hypertension, heart failure, cirrhosis which complicates their presentation.  Of which  require a range of treatment options, and does is a complaint that involves a hig risk of morbidity and mortality.   Arrived in ED by:  EMS History obtained from: The patient  Limitations in history: none      At this time I am most concerned for diverticulitis. Also considering nephrolithiasis, pyelonephritis, gastroenteritis, intra-abdominal abscess, intra-abdominal pathology. Plan for laboratory and imaging study work-up     Laboratory work-up significant for:  -Laboratory work-up obtained from triage reveals an unremarkable CBC, no significant leukocytosis or anemia.  CMP unremarkable, mildly elevated creatinine 1.3, close of his baseline, lipase 32.     Radiologic work-up was significant for: -CT abdomen and pelvis unremarkable, evidence of diverticulosis without diverticulitis, no additional significant intra-abdominal pathology.     Interventions and Interval History: -Patient was generally well on my initial exam, he was in no acute distress and not having significant abdominal pain.  Patient stated that his pain was worse in the evening and therefore patient will be given symptomatic management with Maalox and Pepcid should the patient's abdominal pain be secondary to that of reflux.  Patient's laboratory and imaging study work-up unremarkable, no significant or abdominal pathology noted on CT abdomen and pelvis.  Patient does not have any concerning symptoms for that of systemic infection, he had no evidence of urinary tract infection on urinalysis, he is not having significant ongoing abdominal pain does not have any chest pain or shortness of breath,  additional laboratory and imaging study work-up not felt to clinically indicated this time.  Emergent pathology of the patient's abdominal pain felt to be ruled out with negative CT scan and reassuring laboratory and imaging study work-up.  Patient instructed to follow-up with his PCP within the next week should his symptoms continue.  Patient be prescribed Pepcid outpatient for symptomatic management.

## 2022-03-02 ENCOUNTER — Encounter: Payer: Self-pay | Admitting: Physician Assistant

## 2022-03-02 ENCOUNTER — Ambulatory Visit: Payer: No Typology Code available for payment source | Attending: Physician Assistant | Admitting: Physician Assistant

## 2022-03-02 VITALS — BP 181/100 | HR 56 | Wt 352.0 lb

## 2022-03-02 DIAGNOSIS — Z09 Encounter for follow-up examination after completed treatment for conditions other than malignant neoplasm: Secondary | ICD-10-CM | POA: Diagnosis not present

## 2022-03-02 DIAGNOSIS — I10 Essential (primary) hypertension: Secondary | ICD-10-CM | POA: Diagnosis not present

## 2022-03-02 DIAGNOSIS — R1032 Left lower quadrant pain: Secondary | ICD-10-CM | POA: Diagnosis not present

## 2022-03-14 ENCOUNTER — Other Ambulatory Visit: Payer: Self-pay

## 2022-03-17 ENCOUNTER — Other Ambulatory Visit: Payer: Self-pay

## 2022-03-21 ENCOUNTER — Other Ambulatory Visit: Payer: Self-pay

## 2022-03-28 ENCOUNTER — Other Ambulatory Visit: Payer: Self-pay

## 2022-03-30 ENCOUNTER — Other Ambulatory Visit: Payer: Self-pay

## 2022-04-05 ENCOUNTER — Other Ambulatory Visit: Payer: Self-pay

## 2022-04-10 NOTE — Progress Notes (Unsigned)
Established Patient Office Visit  Subjective   Patient ID: Austin Mercer., male    DOB: 07-02-1950  Age: 72 y.o. MRN: [id number]  No chief complaint on file.   Patient Presents today for follow up. 09/13/21 Austin Mercer is a 72 year old male with a history of Hypertension, Type 2 Diabetes, Chronic Systolic Heart Failure, and alcoholic cirrhosis presents to the clinic with foot swelling. Patient states he noticed the swelling this past week. Denies trauma, chest pain, shortness of breath. He states he has trouble adhering to his medications and on a weekly basis comes into the clinic to organize his pill container. Admits to occasional alcohol use. Denies tobacco use.  The patient has not been taking some of his morning medicines based on the pharmacy there is been doing his pill organizer they state that every week he comes in the a.m. medications have not been used  Blood Pressure today 178/84. Hemoglobin A1C 7.2. Patient did not take his medications today. Patient does not regularly check his blood sugar at home. He states he lost his glucose meter. I stressed the importance of medication adherence and checking his blood sugar to improve his chronic conditions.   Requesting medication refills.  Will need to check Kidney function and obtain urine for microalbuminuria.  7/18 Patient seen in return follow-up has had difficulty with being compliant with his blood pressure medications.  He does have a pill organizer but often forgets to take the evening dose.  Patient brings his pill organizer with him today and he has multiple afternoon doses of carvedilol not taken.  On arrival blood pressure is 163/84  Below are documentations when he has been to the pharmacy to pick up medications and blood pressures checked Patient seen by Tanna Furry Day, PharmD Candidate on 10/14/21 while they were picking up prescriptions at [facility].    Blood pressure  today was : 184/101 (first reading) HR: 68; 178/95 (second reading) HR: 68     Patient has an automated home blood pressure machine. Patient reports having not used the monitor yet.   Medication review was performed. They are not taking medications as prescribed. He reports forgetting to take medications some of the time - especially during the evenings. He goes to the pharmacy every week to get his medications packaged for him.   The following barriers to adherence were noted:  - Forgetting to take medications  - Questions/Concerns about medications. Patient was unaware of which medications of his were for blood pressure.   The following interventions were completed:  - Medications were reviewed  - Patient was educated on medications, including indication and administration  - Patient was educated on use of adherence strategies, like an alarm for the evenings  - Patient was educated on proper technique to check home blood pressure and reminded to bring home machine and readings to next provider appointment  - Patient was counseled on lifestyle modifications to improve blood pressure    As noted above the patient has difficulty with medication compliance and has multiple barriers not understanding what his medicines are for and forgetting to take afternoon doses  04/11/22 Saw Austin Mercer 03/02/22  Austin Mercer is a 72 y.o. male here today for a follow up visit After ED visit 01/12/2022 for LLQ pain.  The pain has resolved.  He was out of all of his medications but says he just picked them all up from the pharmacy and does not need other  RF.  He can not tell me what he is and isn't taking.  He expresses frustration that I am asking him questions.  He uses profanity throughout visit-saying "I'm not coming to this damn place."  "The ED is shitty." Etc and so on.  He denies dizziness/CP/SOB     From ED visit 10/12:   This patient is Presenting for Evaluation of left lower quadrant abdominal pain,  patient has a past medical history type 2 diabetes, hypertension, heart failure, cirrhosis which complicates their presentation.  Of which  require a range of treatment options, and does is a complaint that involves a hig risk of morbidity and mortality.   Arrived in ED by:  EMS History obtained from: The patient  Limitations in history: none      At this time I am most concerned for diverticulitis. Also considering nephrolithiasis, pyelonephritis, gastroenteritis, intra-abdominal abscess, intra-abdominal pathology. Plan for laboratory and imaging study work-up     Laboratory work-up significant for:  -Laboratory work-up obtained from triage reveals an unremarkable CBC, no significant leukocytosis or anemia.  CMP unremarkable, mildly elevated creatinine 1.3, close of his baseline, lipase 32.     Radiologic work-up was significant for: -CT abdomen and pelvis unremarkable, evidence of diverticulosis without diverticulitis, no additional significant intra-abdominal pathology.     Interventions and Interval History: -Patient was generally well on my initial exam, he was in no acute distress and not having significant abdominal pain.  Patient stated that his pain was worse in the evening and therefore patient will be given symptomatic management with Maalox and Pepcid should the patient's abdominal pain be secondary to that of reflux.  Patient's laboratory and imaging study work-up unremarkable, no significant or abdominal pathology noted on CT abdomen and pelvis.  Patient does not have any concerning symptoms for that of systemic infection, he had no evidence of urinary tract infection on urinalysis, he is not having significant ongoing abdominal pain does not have any chest pain or shortness of breath, additional laboratory and imaging study work-up not felt to clinically indicated this time.  Emergent pathology of the patient's abdominal pain felt to be ruled out with negative CT scan and reassuring  laboratory and imaging study work-up.  Patient instructed to follow-up with his PCP within the next week should his symptoms continue.  Patient be prescribed Pepcid outpatient for symptomatic management. Past Medical History:  Diagnosis Date   Diabetes ([place])    Hypertension    Hypertensive urgency 03/04/2020     Family History  Problem Relation Age of Onset   Diabetes Father      Social History   Socioeconomic History   Marital status: Divorced    Spouse name: Not on file   Number of children: 2   Years of education: Not on file   Highest education level: Not on file  Occupational History   Occupation: retired  Tobacco Use   Smoking status: Former    Types: Cigarettes    Quit date: 1973    Years since quitting: 51.0   Smokeless tobacco: Never  Vaping Use   Vaping Use: Never used  Substance and Sexual Activity   Alcohol use: Yes    Comment: Beer, Red wine   Drug use: Not Currently   Sexual activity: Not on file  Other Topics Concern   Not on file  Social History Narrative   Not on file   Social Determinants of Health   Financial Resource Strain: Not on file  Food Insecurity:  Not on file  Transportation Needs: Not on file  Physical Activity: Not on file  Stress: Not on file  Social Connections: Not on file  Intimate Partner Violence: Not on file     Allergies  Allergen Reactions   Codeine Other (See Comments)   Latex Itching     Outpatient Medications Prior to Visit  Medication Sig Dispense Refill   Accu-Chek Softclix Lancets lancets Use as directed to test blood sugar once daily. 100 each 2   amLODipine (NORVASC) 10 MG tablet Take 1 tablet (10 mg total) by mouth once daily. 90 tablet 1   atorvastatin (LIPITOR) 10 MG tablet Take 1 tablet (10 mg total) by mouth once daily. 90 tablet 1   Blood Glucose Monitoring Suppl (ACCU-CHEK GUIDE) w/Device KIT Use as directed to test blood sugar once daily. 1 kit 0   Blood Pressure Monitoring (BLOOD PRESSURE MONITOR 7)  DEVI Use to monitor blood pressure. 1 each 0   carvedilol (COREG) 25 MG tablet TAKE 1 TABLET (25 MG TOTAL) BY MOUTH 2 (TWO) TIMES DAILY WITH A MEAL. 120 tablet 3   dapagliflozin propanediol (FARXIGA) 10 MG TABS tablet Take 1 tablet (10 mg total) by mouth once daily before breakfast. 30 tablet 2   famotidine (PEPCID) 20 MG tablet Take 1 tablet (20 mg total) by mouth daily. 30 tablet 2   furosemide (LASIX) 40 MG tablet TAKE 1 TABLET (40 MG TOTAL) BY MOUTH ONCE DAILY. 90 tablet 2   losartan (COZAAR) 100 MG tablet Take 1 tablet (100 mg total) by mouth once daily. 90 tablet 2   metFORMIN (GLUCOPHAGE) 500 MG tablet Take 1 tablet (500 mg total) by mouth 2 (two) times daily with a meal. 180 tablet 1   potassium chloride (KLOR-CON) 8 MEQ tablet Take 2 tablets (16 mEq total) by mouth 2 (two) times daily. 120 tablet 6   spironolactone (ALDACTONE) 25 MG tablet Take 1 tablet (25 mg total) by mouth once daily. 90 tablet 2   tadalafil, PAH, (ADCIRCA) 20 MG tablet take 1 tablet by mouth once daily 1 hour prior to sex as needed. limit use to 1 tab per 24 hours. 10 tablet 2   No facility-administered medications prior to visit.    Review of Systems  Constitutional: Negative.  Negative for chills, diaphoresis, fever, malaise/fatigue and weight loss.       Weight gain is significant from edema  HENT: Negative.  Negative for congestion, ear discharge, ear pain, hearing loss, nosebleeds, sore throat and tinnitus.   Eyes: Negative.  Negative for blurred vision, double vision, photophobia and discharge.  Respiratory: Negative.  Negative for cough, hemoptysis, sputum production, shortness of breath, wheezing and stridor.        No excess mucus  Cardiovascular:  Negative for chest pain, palpitations, orthopnea, claudication, leg swelling and PND.  Gastrointestinal: Negative.  Negative for abdominal pain, blood in stool, constipation, diarrhea, heartburn, melena, nausea and vomiting.  Genitourinary: Negative.  Negative  for dysuria, flank pain, frequency, hematuria and urgency.  Musculoskeletal:  Positive for joint pain (knee). Negative for back pain, falls, myalgias and neck pain.  Skin: Negative.  Negative for itching and rash.  Neurological: Negative.  Negative for dizziness, tingling, tremors, sensory change, speech change, focal weakness, seizures, loss of consciousness, weakness and headaches.  Endo/Heme/Allergies: Negative.  Negative for environmental allergies and polydipsia. Does not bruise/bleed easily.  Psychiatric/Behavioral:  Positive for depression. Negative for hallucinations, memory loss, substance abuse and suicidal ideas. The patient is not nervous/anxious and  does not have insomnia.   All other systems reviewed and are negative.     Objective:     There were no vitals taken for this visit. BP Readings from Last 3 Encounters:  03/02/22 (!) 181/100  01/12/22 (!) 186/80  01/04/22 (!) 154/74   Wt Readings from Last 3 Encounters:  03/02/22 (!) 352 lb (159.7 kg)  01/12/22 (!) 460 lb (208.7 kg)  01/04/22 (!) 361 lb 9.6 oz (164 kg)      Physical Exam Vitals reviewed.  Constitutional:      Appearance: Normal appearance. He is well-developed. He is obese. He is not diaphoretic.  HENT:     Head: Normocephalic and atraumatic.     Nose: No nasal deformity, septal deviation, mucosal edema or rhinorrhea.     Right Sinus: No maxillary sinus tenderness or frontal sinus tenderness.     Left Sinus: No maxillary sinus tenderness or frontal sinus tenderness.     Mouth/Throat:     Mouth: Mucous membranes are moist.     Pharynx: Oropharynx is clear. No oropharyngeal exudate.  Eyes:     General: No scleral icterus.    Conjunctiva/sclera: Conjunctivae normal.     Pupils: Pupils are equal, round, and reactive to light.  Neck:     Thyroid: No thyromegaly.     Vascular: No carotid bruit or JVD.     Trachea: Trachea normal. No tracheal tenderness or tracheal deviation.  Cardiovascular:     Rate  and Rhythm: Normal rate and regular rhythm.     Chest Wall: PMI is not displaced.     Pulses: Normal pulses. No decreased pulses.     Heart sounds: Normal heart sounds, S1 normal and S2 normal. Heart sounds not distant. No murmur heard.    No systolic murmur is present.     No diastolic murmur is present.     No friction rub. No gallop. No S3 or S4 sounds.  Pulmonary:     Effort: Pulmonary effort is normal. No tachypnea, accessory muscle usage or respiratory distress.     Breath sounds: Normal breath sounds. No stridor. No decreased breath sounds, wheezing, rhonchi or rales.  Chest:     Chest wall: No tenderness.  Abdominal:     General: Bowel sounds are normal. There is no distension.     Palpations: Abdomen is soft. Abdomen is not rigid.     Tenderness: There is no abdominal tenderness. There is no guarding or rebound.  Musculoskeletal:        General: Normal range of motion.     Cervical back: Normal range of motion and neck supple. No edema, erythema or rigidity. No muscular tenderness. Normal range of motion.     Right lower leg: No edema.     Left lower leg: No edema.  Lymphadenopathy:     Head:     Right side of head: No submental or submandibular adenopathy.     Left side of head: No submental or submandibular adenopathy.     Cervical: No cervical adenopathy.  Skin:    General: Skin is warm and dry.     Coloration: Skin is not pale.     Findings: No rash.     Nails: There is no clubbing.  Neurological:     Mental Status: He is alert and oriented to person, place, and time.     Sensory: No sensory deficit.  Psychiatric:        Speech: Speech normal.  Behavior: Behavior normal.      No results found for any visits on 04/11/22.   Last CBC Lab Results  Component Value Date   WBC 7.1 01/12/2022   HGB 13.8 01/12/2022   HCT 41.0 01/12/2022   MCV 81.7 01/12/2022   MCH 27.5 01/12/2022   RDW 16.2 (H) 01/12/2022   PLT 144 (L) 01/12/2022   Last metabolic  panel Lab Results  Component Value Date   GLUCOSE 121 (H) 01/12/2022   NA 136 01/12/2022   K 4.1 01/12/2022   CL 108 01/12/2022   CO2 22 01/12/2022   BUN 12 01/12/2022   CREATININE 1.30 (H) 01/12/2022   EGFR 55 (L) 01/04/2022   CALCIUM 10.8 (H) 01/12/2022   PHOS 2.9 10/18/2021   PROT 8.2 (H) 01/12/2022   ALBUMIN 4.0 01/12/2022   LABGLOB 3.5 01/04/2022   AGRATIO 1.1 (L) 01/04/2022   BILITOT 0.6 01/12/2022   ALKPHOS 39 01/12/2022   AST 18 01/12/2022   ALT 16 01/12/2022   ANIONGAP 6 01/12/2022   Last lipids Lab Results  Component Value Date   CHOL 108 02/23/2021   HDL 36 (L) 02/23/2021   LDLCALC 52 02/23/2021   TRIG 105 02/23/2021   CHOLHDL 3.0 02/23/2021   Last hemoglobin A1c Lab Results  Component Value Date   HGBA1C 6.4 (H) 01/04/2022   Last thyroid functions Lab Results  Component Value Date   TSH 3.060 11/27/2017   Last vitamin D Lab Results  Component Value Date   VD25OH 28.4 (L) 11/23/2017   Last vitamin B12 and Folate No results found for: "VITAMINB12", "FOLATE"  The ASCVD Risk score (Arnett DK, et al., 2019) failed to calculate for the following reasons:   The valid total cholesterol range is 130 to 320 mg/dL    Assessment & Plan:   Problem List Items Addressed This Visit   None No follow-ups on file.    Shan Levans, MD

## 2022-04-11 ENCOUNTER — Ambulatory Visit: Payer: Medicare PPO | Admitting: Critical Care Medicine

## 2022-04-20 ENCOUNTER — Other Ambulatory Visit: Payer: Self-pay

## 2022-04-20 MED ORDER — COMIRNATY 30 MCG/0.3ML IM SUSY
0.3000 mL | PREFILLED_SYRINGE | Freq: Once | INTRAMUSCULAR | 0 refills | Status: AC
  Filled 2022-04-20: qty 0.3, 1d supply, fill #0

## 2022-04-20 MED ORDER — FLUAD QUADRIVALENT 0.5 ML IM PRSY
0.5000 mL | PREFILLED_SYRINGE | Freq: Once | INTRAMUSCULAR | 0 refills | Status: AC
  Filled 2022-04-20: qty 0.5, 1d supply, fill #0

## 2022-04-27 ENCOUNTER — Other Ambulatory Visit: Payer: Self-pay

## 2022-05-04 ENCOUNTER — Other Ambulatory Visit: Payer: Self-pay

## 2022-05-04 ENCOUNTER — Other Ambulatory Visit: Payer: Self-pay | Admitting: Pharmacist

## 2022-05-04 ENCOUNTER — Other Ambulatory Visit: Payer: Self-pay | Admitting: Critical Care Medicine

## 2022-05-04 DIAGNOSIS — E1129 Type 2 diabetes mellitus with other diabetic kidney complication: Secondary | ICD-10-CM

## 2022-05-04 MED ORDER — METFORMIN HCL 500 MG PO TABS
500.0000 mg | ORAL_TABLET | Freq: Two times a day (BID) | ORAL | 0 refills | Status: DC
  Filled 2022-05-04 – 2022-05-31 (×2): qty 180, 90d supply, fill #0

## 2022-05-04 MED ORDER — FAMOTIDINE 20 MG PO TABS
20.0000 mg | ORAL_TABLET | Freq: Two times a day (BID) | ORAL | 2 refills | Status: DC
  Filled 2022-05-04: qty 60, 30d supply, fill #0

## 2022-05-04 NOTE — Telephone Encounter (Signed)
Requested Prescriptions  Pending Prescriptions Disp Refills   metFORMIN (GLUCOPHAGE) 500 MG tablet 180 tablet 0    Sig: Take 1 tablet (500 mg total) by mouth 2 (two) times daily with a meal.     Endocrinology:  Diabetes - Biguanides Failed - 05/04/2022 11:18 AM      Failed - Cr in normal range and within 360 days    Creatinine, Ser  Date Value Ref Range Status  01/12/2022 1.30 (H) 0.61 - 1.24 mg/dL Final         Failed - eGFR in normal range and within 360 days    GFR calc Af Amer  Date Value Ref Range Status  03/04/2020 53 (L) >59 mL/min/1.73 Final    Comment:    **In accordance with recommendations from the NKF-ASN Task force,**   [facility] is in the process of updating its eGFR calculation to the   2021 CKD-EPI creatinine equation that estimates kidney function   without a race variable.    GFR, Estimated  Date Value Ref Range Status  01/12/2022 59 (L) >60 mL/min Final    Comment:    (NOTE) Calculated using the CKD-EPI Creatinine Equation (2021)    eGFR  Date Value Ref Range Status  01/04/2022 55 (L) >59 mL/min/1.73 Final         Failed - B12 Level in normal range and within 720 days    No results found for: "VITAMINB12"       Passed - HBA1C is between 0 and 7.9 and within 180 days    HbA1c, POC (prediabetic range)  Date Value Ref Range Status  01/17/2019 6.7 (A) 5.7 - 6.4 % Final   HbA1c, POC (controlled diabetic range)  Date Value Ref Range Status  09/13/2021 7.2 (A) 0.0 - 7.0 % Final   Hgb A1c MFr Bld  Date Value Ref Range Status  01/04/2022 6.4 (H) 4.8 - 5.6 % Final    Comment:             Prediabetes: 5.7 - 6.4          Diabetes: >6.4          Glycemic control for adults with diabetes: <7.0          Passed - Valid encounter within last 6 months    Recent Outpatient Visits           2 months ago Essential hypertension   [place], [place]   4 months ago Primary osteoarthritis of right knee   [place], Vernia Buff, NP   6 months ago Essential hypertension   [place], Patrick E, MD   7 months ago Essential hypertension   [place] [name], MD   1 year ago Medication management   [place], [place] L, RPH-CPP              Passed - CBC within normal limits and completed in the last 12 months    WBC  Date Value Ref Range Status  01/12/2022 7.1 4.0 - 10.5 K/uL Final   RBC  Date Value Ref Range Status  01/12/2022 5.02 4.22 - 5.81 MIL/uL Final   Hemoglobin  Date Value Ref Range Status  01/12/2022 13.8 13.0 - 17.0 g/dL Final  02/23/2021 13.2 13.0 - 17.7 g/dL Final  HCT  Date Value Ref Range Status  01/12/2022 41.0 39.0 - 52.0 % Final   Hematocrit  Date Value Ref Range Status  02/23/2021 39.9 37.5 - 51.0 % Final   MCHC  Date Value Ref Range Status  01/12/2022 33.7 30.0 - 36.0 g/dL Final   [facility]  Date Value Ref Range Status  01/12/2022 27.5 26.0 - 34.0 pg Final   MCV  Date Value Ref Range Status  01/12/2022 81.7 80.0 - 100.0 fL Final  02/23/2021 78 (L) 79 - 97 fL Final   No results found for: "PLTCOUNTKUC", "LABPLAT", "POCPLA" RDW  Date Value Ref Range Status  01/12/2022 16.2 (H) 11.5 - 15.5 % Final  02/23/2021 14.2 11.6 - 15.4 % Final

## 2022-05-05 ENCOUNTER — Other Ambulatory Visit: Payer: Self-pay | Admitting: Pharmacist

## 2022-05-05 ENCOUNTER — Other Ambulatory Visit: Payer: Self-pay

## 2022-05-05 MED ORDER — FAMOTIDINE 20 MG PO TABS
20.0000 mg | ORAL_TABLET | Freq: Every day | ORAL | 0 refills | Status: DC
  Filled 2022-05-05 – 2022-05-31 (×2): qty 90, 90d supply, fill #0

## 2022-05-11 ENCOUNTER — Other Ambulatory Visit: Payer: Self-pay

## 2022-05-11 ENCOUNTER — Emergency Department (HOSPITAL_COMMUNITY): Payer: Medicare PPO

## 2022-05-11 ENCOUNTER — Inpatient Hospital Stay (HOSPITAL_COMMUNITY): Payer: Medicare PPO

## 2022-05-11 ENCOUNTER — Inpatient Hospital Stay (HOSPITAL_COMMUNITY)
Admission: EM | Admit: 2022-05-11 | Discharge: 2022-05-13 | DRG: 062 | Disposition: A | Discharge status: Home / Self Care | Payer: Medicare PPO | Attending: Neurology | Admitting: Neurology

## 2022-05-11 ENCOUNTER — Encounter (HOSPITAL_COMMUNITY): Payer: Self-pay

## 2022-05-11 DIAGNOSIS — I639 Cerebral infarction, unspecified: Secondary | ICD-10-CM

## 2022-05-11 DIAGNOSIS — Z87891 Personal history of nicotine dependence: Secondary | ICD-10-CM | POA: Diagnosis not present

## 2022-05-11 DIAGNOSIS — R4182 Altered mental status, unspecified: Secondary | ICD-10-CM | POA: Diagnosis not present

## 2022-05-11 DIAGNOSIS — E785 Hyperlipidemia, unspecified: Secondary | ICD-10-CM | POA: Diagnosis present

## 2022-05-11 DIAGNOSIS — Z7984 Long term (current) use of oral hypoglycemic drugs: Secondary | ICD-10-CM

## 2022-05-11 DIAGNOSIS — G4730 Sleep apnea, unspecified: Secondary | ICD-10-CM | POA: Diagnosis present

## 2022-05-11 DIAGNOSIS — R29715 NIHSS score 15: Secondary | ICD-10-CM | POA: Diagnosis present

## 2022-05-11 DIAGNOSIS — I69351 Hemiplegia and hemiparesis following cerebral infarction affecting right dominant side: Secondary | ICD-10-CM | POA: Diagnosis not present

## 2022-05-11 DIAGNOSIS — Z833 Family history of diabetes mellitus: Secondary | ICD-10-CM

## 2022-05-11 DIAGNOSIS — I10 Essential (primary) hypertension: Secondary | ICD-10-CM | POA: Diagnosis not present

## 2022-05-11 DIAGNOSIS — Z79899 Other long term (current) drug therapy: Secondary | ICD-10-CM | POA: Diagnosis not present

## 2022-05-11 DIAGNOSIS — I6523 Occlusion and stenosis of bilateral carotid arteries: Secondary | ICD-10-CM | POA: Diagnosis not present

## 2022-05-11 DIAGNOSIS — I672 Cerebral atherosclerosis: Secondary | ICD-10-CM | POA: Diagnosis not present

## 2022-05-11 DIAGNOSIS — R202 Paresthesia of skin: Secondary | ICD-10-CM | POA: Diagnosis not present

## 2022-05-11 DIAGNOSIS — I6381 Other cerebral infarction due to occlusion or stenosis of small artery: Secondary | ICD-10-CM | POA: Diagnosis not present

## 2022-05-11 DIAGNOSIS — I110 Hypertensive heart disease with heart failure: Secondary | ICD-10-CM | POA: Diagnosis present

## 2022-05-11 DIAGNOSIS — I6389 Other cerebral infarction: Secondary | ICD-10-CM

## 2022-05-11 DIAGNOSIS — Z8673 Personal history of transient ischemic attack (TIA), and cerebral infarction without residual deficits: Secondary | ICD-10-CM | POA: Diagnosis present

## 2022-05-11 DIAGNOSIS — I6340 Cerebral infarction due to embolism of unspecified cerebral artery: Secondary | ICD-10-CM | POA: Diagnosis not present

## 2022-05-11 DIAGNOSIS — Z885 Allergy status to narcotic agent status: Secondary | ICD-10-CM

## 2022-05-11 DIAGNOSIS — I6623 Occlusion and stenosis of bilateral posterior cerebral arteries: Secondary | ICD-10-CM | POA: Diagnosis not present

## 2022-05-11 DIAGNOSIS — Z6841 Body Mass Index (BMI) 40.0 and over, adult: Secondary | ICD-10-CM | POA: Diagnosis not present

## 2022-05-11 DIAGNOSIS — Z91040 Latex allergy status: Secondary | ICD-10-CM

## 2022-05-11 DIAGNOSIS — R4701 Aphasia: Secondary | ICD-10-CM | POA: Diagnosis present

## 2022-05-11 DIAGNOSIS — I440 Atrioventricular block, first degree: Secondary | ICD-10-CM

## 2022-05-11 DIAGNOSIS — E119 Type 2 diabetes mellitus without complications: Secondary | ICD-10-CM | POA: Diagnosis present

## 2022-05-11 DIAGNOSIS — I5022 Chronic systolic (congestive) heart failure: Secondary | ICD-10-CM

## 2022-05-11 DIAGNOSIS — I161 Hypertensive emergency: Secondary | ICD-10-CM | POA: Diagnosis not present

## 2022-05-11 DIAGNOSIS — I493 Ventricular premature depolarization: Secondary | ICD-10-CM

## 2022-05-11 DIAGNOSIS — I6601 Occlusion and stenosis of right middle cerebral artery: Secondary | ICD-10-CM | POA: Diagnosis not present

## 2022-05-11 LAB — CBC
HCT: 39.8 % (ref 39.0–52.0)
Hemoglobin: 13.2 g/dL (ref 13.0–17.0)
MCH: 26.8 pg (ref 26.0–34.0)
MCHC: 33.2 g/dL (ref 30.0–36.0)
MCV: 80.7 fL (ref 80.0–100.0)
Platelets: 136 10*3/uL — ABNORMAL LOW (ref 150–400)
RBC: 4.93 MIL/uL (ref 4.22–5.81)
RDW: 16.5 % — ABNORMAL HIGH (ref 11.5–15.5)
WBC: 5 10*3/uL (ref 4.0–10.5)
nRBC: 0 % (ref 0.0–0.2)

## 2022-05-11 LAB — URINALYSIS, ROUTINE W REFLEX MICROSCOPIC
Bacteria, UA: NONE SEEN
Bilirubin Urine: NEGATIVE
Glucose, UA: >=500 mg/dL — AB
Hgb urine dipstick: NEGATIVE
Ketones, ur: NEGATIVE mg/dL
Leukocytes,Ua: NEGATIVE
Nitrite: NEGATIVE
Protein, ur: 30 mg/dL — AB
Specific Gravity, Urine: 1.007 (ref 1.005–1.030)
pH: 6 (ref 5.0–8.0)

## 2022-05-11 LAB — COMPREHENSIVE METABOLIC PANEL
ALT: 14 U/L (ref 0–44)
AST: 17 U/L (ref 15–41)
Albumin: 2.8 g/dL — ABNORMAL LOW (ref 3.5–5.0)
Alkaline Phosphatase: 28 U/L — ABNORMAL LOW (ref 38–126)
Anion gap: 5 (ref 5–15)
BUN: 14 mg/dL (ref 8–23)
CO2: 19 mmol/L — ABNORMAL LOW (ref 22–32)
Calcium: 8.5 mg/dL — ABNORMAL LOW (ref 8.9–10.3)
Chloride: 113 mmol/L — ABNORMAL HIGH (ref 98–111)
Creatinine, Ser: 1.08 mg/dL (ref 0.61–1.24)
GFR, Estimated: >60 mL/min (ref >60)
Glucose, Bld: 113 mg/dL — ABNORMAL HIGH (ref 70–99)
Potassium: 3.4 mmol/L — ABNORMAL LOW (ref 3.5–5.1)
Sodium: 137 mmol/L (ref 135–145)
Total Bilirubin: 0.6 mg/dL (ref 0.3–1.2)
Total Protein: 5.8 g/dL — ABNORMAL LOW (ref 6.5–8.1)

## 2022-05-11 LAB — OSMOLALITY: Osmolality: 291 mOsm/kg (ref 275–295)

## 2022-05-11 LAB — ECHOCARDIOGRAM COMPLETE
AR max vel: 2.83 cm2
AV Area VTI: 2.89 cm2
AV Area mean vel: 2.74 cm2
AV Mean grad: 16 mmHg
AV Peak grad: 30.6 mmHg
Ao pk vel: 2.77 m/s
Area-P 1/2: 2.8 cm2
Calc EF: 66 %
Height: 73 in
P 1/2 time: 709 msec
S' Lateral: 3.3 cm
Single Plane A2C EF: 68.8 %
Single Plane A4C EF: 58.7 %
Weight: 5760 oz

## 2022-05-11 LAB — POCT I-STAT 7, (LYTES, BLD GAS, ICA,H+H)
Acid-base deficit: 4 mmol/L — ABNORMAL HIGH (ref 0.0–2.0)
Bicarbonate: 20.4 mmol/L (ref 20.0–28.0)
Calcium, Ion: 1.51 mmol/L (ref 1.15–1.40)
HCT: 39 % (ref 39.0–52.0)
Hemoglobin: 13.3 g/dL (ref 13.0–17.0)
O2 Saturation: 98 %
Potassium: 4 mmol/L (ref 3.5–5.1)
Sodium: 136 mmol/L (ref 135–145)
TCO2: 21 mmol/L — ABNORMAL LOW (ref 22–32)
pCO2 arterial: 33.8 mmHg (ref 32–48)
pH, Arterial: 7.389 (ref 7.35–7.45)
pO2, Arterial: 97 mmHg (ref 83–108)

## 2022-05-11 LAB — DIFFERENTIAL
Abs Immature Granulocytes: 0.02 10*3/uL (ref 0.00–0.07)
Basophils Absolute: 0.1 10*3/uL (ref 0.0–0.1)
Basophils Relative: 1 %
Eosinophils Absolute: 0.1 10*3/uL (ref 0.0–0.5)
Eosinophils Relative: 2 %
Immature Granulocytes: 0 %
Lymphocytes Relative: 53 %
Lymphs Abs: 2.6 10*3/uL (ref 0.7–4.0)
Monocytes Absolute: 0.6 10*3/uL (ref 0.1–1.0)
Monocytes Relative: 12 %
Neutro Abs: 1.6 10*3/uL — ABNORMAL LOW (ref 1.7–7.7)
Neutrophils Relative %: 32 %

## 2022-05-11 LAB — OSMOLALITY, URINE: Osmolality, Ur: 344 mOsm/kg (ref 300–900)

## 2022-05-11 LAB — I-STAT CHEM 8, ED
BUN: 17 mg/dL (ref 8–23)
Calcium, Ion: 1.46 mmol/L — ABNORMAL HIGH (ref 1.15–1.40)
Chloride: 106 mmol/L (ref 98–111)
Creatinine, Ser: 1.3 mg/dL — ABNORMAL HIGH (ref 0.61–1.24)
Glucose, Bld: 126 mg/dL — ABNORMAL HIGH (ref 70–99)
HCT: 40 % (ref 39.0–52.0)
Hemoglobin: 13.6 g/dL (ref 13.0–17.0)
Potassium: 4.3 mmol/L (ref 3.5–5.1)
Sodium: 137 mmol/L (ref 135–145)
TCO2: 22 mmol/L (ref 22–32)

## 2022-05-11 LAB — GLUCOSE, CAPILLARY: Glucose-Capillary: 122 mg/dL — ABNORMAL HIGH (ref 70–99)

## 2022-05-11 LAB — RAPID URINE DRUG SCREEN, HOSP PERFORMED
Amphetamines: NOT DETECTED
Barbiturates: NOT DETECTED
Benzodiazepines: NOT DETECTED
Cocaine: NOT DETECTED
Opiates: NOT DETECTED
Tetrahydrocannabinol: NOT DETECTED

## 2022-05-11 LAB — MRSA NEXT GEN BY PCR, NASAL: MRSA by PCR Next Gen: NOT DETECTED

## 2022-05-11 LAB — PROTIME-INR
INR: 1.2 (ref 0.8–1.2)
Prothrombin Time: 15.1 seconds (ref 11.4–15.2)

## 2022-05-11 LAB — APTT: aPTT: 28 seconds (ref 24–36)

## 2022-05-11 LAB — ETHANOL: Alcohol, Ethyl (B): <10 mg/dL (ref <10)

## 2022-05-11 LAB — HIV ANTIBODY (ROUTINE TESTING W REFLEX): HIV Screen 4th Generation wRfx: NONREACTIVE

## 2022-05-11 LAB — CBG MONITORING, ED: Glucose-Capillary: 126 mg/dL — ABNORMAL HIGH (ref 70–99)

## 2022-05-11 MED ORDER — ORAL CARE MOUTH RINSE: 15.0000 mL | OROMUCOSAL | Status: DC | PRN

## 2022-05-11 MED ORDER — SENNOSIDES-DOCUSATE SODIUM 8.6-50 MG PO TABS: 1.0000 | ORAL_TABLET | Freq: Every evening | ORAL | Status: DC | PRN

## 2022-05-11 MED ORDER — SODIUM CHLORIDE 0.9 % IV SOLN: INTRAVENOUS | Status: DC

## 2022-05-11 MED ORDER — ACETAMINOPHEN 160 MG/5ML PO SOLN: 650.0000 mg | ORAL | Status: DC | PRN

## 2022-05-11 MED ORDER — IOHEXOL 350 MG/ML SOLN
75.0000 mL | Freq: Once | INTRAVENOUS | Status: AC | PRN
  Administered 2022-05-11: 75 mL via INTRAVENOUS

## 2022-05-11 MED ORDER — TENECTEPLASE FOR STROKE
25.0000 mg | PACK | Freq: Once | INTRAVENOUS | Status: AC
  Administered 2022-05-11: 25 mg via INTRAVENOUS
  Filled 2022-05-11: qty 10

## 2022-05-11 MED ORDER — ACETAMINOPHEN 325 MG PO TABS
650.0000 mg | ORAL_TABLET | ORAL | Status: DC | PRN
  Administered 2022-05-11 – 2022-05-12 (×2): 650 mg via ORAL
  Filled 2022-05-11 (×2): qty 2

## 2022-05-11 MED ORDER — ACETAMINOPHEN 650 MG RE SUPP: 650.0000 mg | RECTAL | Status: DC | PRN

## 2022-05-11 MED ORDER — CLEVIDIPINE BUTYRATE 0.5 MG/ML IV EMUL: 0.0000 mg/h | INTRAVENOUS | Status: DC

## 2022-05-11 MED ORDER — STROKE: EARLY STAGES OF RECOVERY BOOK: Freq: Once | Status: AC

## 2022-05-11 MED ORDER — PANTOPRAZOLE SODIUM 40 MG IV SOLR
40.0000 mg | Freq: Every day | INTRAVENOUS | Status: DC
  Administered 2022-05-11: 40 mg via INTRAVENOUS

## 2022-05-11 MED ORDER — HYDRALAZINE HCL 20 MG/ML IJ SOLN
10.0000 mg | Freq: Once | INTRAMUSCULAR | Status: AC
  Administered 2022-05-11: 10 mg via INTRAVENOUS

## 2022-05-11 MED ORDER — CHLORHEXIDINE GLUCONATE CLOTH 2 % EX PADS
6.0000 | MEDICATED_PAD | Freq: Every day | CUTANEOUS | Status: DC
  Administered 2022-05-11 – 2022-05-13 (×2): 6 via TOPICAL

## 2022-05-11 NOTE — Progress Notes (Signed)
10 mg hydralazine given for BP 179/111 at 0945

## 2022-05-11 NOTE — ED Notes (Addendum)
Pt on table for stat MRI with Primary RN and SRN

## 2022-05-11 NOTE — Progress Notes (Signed)
TNK given 0909. 25 mg.

## 2022-05-11 NOTE — Progress Notes (Signed)
  Echocardiogram 2D Echocardiogram has been performed.  Austin Mercer 05/11/2022, 1:14 PM

## 2022-05-11 NOTE — Progress Notes (Signed)
Pt suddenly unable to answer my questions, even state his name. MD notified, back to CT for stat head.

## 2022-05-11 NOTE — ED Notes (Signed)
Pt went for stat CT scan with primary RN and SRN

## 2022-05-11 NOTE — Progress Notes (Signed)
Pt in MRI, continuous monitoring in place.

## 2022-05-11 NOTE — ED Triage Notes (Addendum)
Pt called EMS for right knee tingling and right hand, patient is dizzy when walking around. Unknown when the symptoms started. Speech is clear, patient is alert and oriented. No other complaints at this time  BP meds 15 minutes prior to EMS arrival  180/96 60 hr 99% RA 136 CBG

## 2022-05-11 NOTE — ED Provider Notes (Signed)
[facility] Provider Note   CSN: [id number] Arrival date & time: 05/11/22  5397     History  Chief Complaint  Patient presents with   Hypertension    Austin Mercer. is a 72 y.o. male.  72 year old with prior medical history significant for hypertension and diabetes presents with complaint of right hand and right leg tingling and weakness.  Patient reports that he was in normal state of health yesterday.  He reports that he woke up around 6:45 AM.  He denies any symptoms upon awakening.  At approximately 7 AM he developed acute onset right hand and right foot tingling.  He reports associated mild weakness.  He did take his morning blood pressure medications prior to calling EMS.  On arrival to the ED the patient is reporting that he feels improved.  He complains of persistent mild paresthesias to the right hand primarily in the fingertips and also in the right foot.  Patient is noted to be hypertensive on initial triage.  Given timeframe of symptoms Code Stroke initiated.  The history is provided by the patient and medical records.       Home Medications Prior to Admission medications   Medication Sig Start Date End Date Taking? Authorizing Provider  Accu-Chek Softclix Lancets lancets Use as directed to test blood sugar once daily. 09/13/21   [name], MD  amLODipine (NORVASC) 10 MG tablet Take 1 tablet (10 mg total) by mouth once daily. 10/18/21   [name], MD  atorvastatin (LIPITOR) 10 MG tablet Take 1 tablet (10 mg total) by mouth once daily. 09/13/21   [name], MD  Blood Glucose Monitoring Suppl (ACCU-CHEK GUIDE) w/Device KIT Use as directed to test blood sugar once daily. 09/13/21   [name], MD  Blood Pressure Monitoring (BLOOD PRESSURE MONITOR 7) DEVI Use to monitor blood pressure. 03/04/20   [name], MD  carvedilol (COREG) 25 MG tablet TAKE 1 TABLET (25 MG TOTAL) BY MOUTH 2 (TWO) TIMES  DAILY WITH A MEAL. 09/13/21   [name], MD  dapagliflozin propanediol (FARXIGA) 10 MG TABS tablet Take 1 tablet (10 mg total) by mouth once daily before breakfast. 02/20/22   [name], MD  famotidine (PEPCID) 20 MG tablet Take 1 tablet (20 mg total) by mouth daily. 05/05/22   [name], MD  furosemide (LASIX) 40 MG tablet TAKE 1 TABLET (40 MG TOTAL) BY MOUTH ONCE DAILY. 10/18/21   [name], MD  losartan (COZAAR) 100 MG tablet Take 1 tablet (100 mg total) by mouth once daily. 09/13/21   [name], MD  metFORMIN (GLUCOPHAGE) 500 MG tablet Take 1 tablet (500 mg total) by mouth 2 (two) times daily with a meal. 05/04/22   [name], MD  potassium chloride (KLOR-CON) 8 MEQ tablet Take 2 tablets (16 mEq total) by mouth 2 (two) times daily. 09/13/21   [name], MD  spironolactone (ALDACTONE) 25 MG tablet Take 1 tablet (25 mg total) by mouth once daily. 09/13/21   [name], MD  tadalafil, PAH, (ADCIRCA) 20 MG tablet take 1 tablet by mouth once daily 1 hour prior to sex as needed. limit use to 1 tab per 24 hours. 02/27/22   [name], MD      Allergies    Codeine and Latex    Review of Systems   Review of Systems  All other systems reviewed and are  negative.   Physical Exam Updated Vital Signs BP (!) 192/103   Pulse (!) 58   Resp 19   SpO2 100%  Physical Exam Vitals and nursing note reviewed.  Constitutional:      General: He is not in acute distress.    Appearance: Normal appearance. He is well-developed.  HENT:     Head: Normocephalic and atraumatic.  Eyes:     Conjunctiva/sclera: Conjunctivae normal.     Pupils: Pupils are equal, round, and reactive to light.  Cardiovascular:     Rate and Rhythm: Normal rate and regular rhythm.     Heart sounds: Normal heart sounds.  Pulmonary:     Effort: Pulmonary effort is normal. No respiratory distress.     Breath sounds: Normal breath sounds.  Abdominal:     General: There is  no distension.     Palpations: Abdomen is soft.     Tenderness: There is no abdominal tenderness.  Musculoskeletal:        General: No deformity. Normal range of motion.     Cervical back: Normal range of motion and neck supple.  Skin:    General: Skin is warm and dry.  Neurological:     General: No focal deficit present.     Mental Status: He is alert and oriented to person, place, and time.     Comments: Alert, normal speech, no facial droop, oriented x 4  Reports decreased tingling paresthesias to distal aspect of right hand and right foot.  VAN Negative     ED Results / Procedures / Treatments   Labs (all labs ordered are listed, but only abnormal results are displayed) Labs Reviewed  I-STAT CHEM 8, ED - Abnormal; Notable for the following components:      Result Value   Creatinine, Ser 1.30 (*)    Glucose, Bld 126 (*)    Calcium, Ion 1.46 (*)    All other components within normal limits  CBG MONITORING, ED - Abnormal; Notable for the following components:   Glucose-Capillary 126 (*)    All other components within normal limits  ETHANOL  PROTIME-INR  APTT  CBC  DIFFERENTIAL  COMPREHENSIVE METABOLIC PANEL  RAPID URINE DRUG SCREEN, HOSP PERFORMED  URINALYSIS, ROUTINE W REFLEX MICROSCOPIC    EKG None  Radiology No results found.  Procedures Procedures    Medications Ordered in ED Medications - No data to display  ED Course/ Medical Decision Making/ A&P                             Medical Decision Making Amount and/or Complexity of Data Reviewed Labs: ordered. Radiology: ordered.    Medical Screen Complete  This patient presented to the ED with complaint of tingling paresthesias to right hand and right foot.  This complaint involves an extensive number of treatment options. The initial differential diagnosis includes, but is not limited to, CVA, metabolic abnormality, etc.  This presentation is: Acute, Self-Limited, Previously Undiagnosed,  Uncertain Prognosis, Complicated, Systemic Symptoms, and Threat to Life/Bodily Function  Patient is presenting with acute onset tingling paresthesias to the right hand and right foot.  Last known well was 0700 AM.  Patient with history of hypertension and diabetes.  Presentation is not inconsistent with likely ischemic CVA.  Code Stroke initiated on evaluation of the patient by myself in his ED exam room.  Patient seen in CT by Neuro team.  Patient felt to be candidate for TNK.  Patient admitted by Neuro team.  Additional history obtained:  External records from outside sources obtained and reviewed including prior ED visits and prior Inpatient records.    Lab Tests:  I ordered and personally interpreted labs.  The pertinent results include: CBC, C MP, i-STAT Chem-8, CBG   Imaging Studies ordered:  I ordered imaging studies including CT head I independently visualized and interpreted obtained imaging which showed no acute intercranial hemorrhage I agree with the radiologist interpretation.   Cardiac Monitoring:  The patient was maintained on a cardiac monitor.  I personally viewed and interpreted the cardiac monitor which showed an underlying rhythm of: NSR  Problem List / ED Course:  CVA, paresthesia   Reevaluation:  After the interventions noted above, I reevaluated the patient and found that they have: stayed the same  Disposition:  After consideration of the diagnostic results and the patients response to treatment, I feel that the patent would benefit from admission.   CRITICAL CARE Performed by: Valarie Merino   Total critical care time: 30 minutes  Critical care time was exclusive of separately billable procedures and treating other patients.  Critical care was necessary to treat or prevent imminent or life-threatening deterioration.  Critical care was time spent personally by me on the following activities: development of treatment plan with patient  and/or surrogate as well as nursing, discussions with consultants, evaluation of patient's response to treatment, examination of patient, obtaining history from patient or surrogate, ordering and performing treatments and interventions, ordering and review of laboratory studies, ordering and review of radiographic studies, pulse oximetry and re-evaluation of patient's condition.          Final Clinical Impression(s) / ED Diagnoses Final diagnoses:  Paresthesia  Cerebrovascular accident (CVA), unspecified mechanism [facility])    Rx / DC Orders ED Discharge Orders     None         Valarie Merino, MD 05/11/22 [phone]

## 2022-05-11 NOTE — ED Notes (Signed)
Patient went bradycardic to the 30s. Pt maintaining oxygen saturation of 97%, unable to obtain BP due to monitor failure at this time.

## 2022-05-11 NOTE — ED Notes (Signed)
Pt stood up to use the urinal, upon sitting back in bed patient became less responsive, Pt had a left sided gaze, right sided neglect, unable to answer questions, unable to follow commands. Dr. Rory Percy called and stat CT ordered.

## 2022-05-11 NOTE — Progress Notes (Signed)
PHARMACIST CODE STROKE RESPONSE  Notified to mix TNK at 0907 by Dr. Rory Percy TNK preparation completed at 0908  TNK dose = 25 mg IV over 5 seconds (no weight documented today but previous weights all >300 lb)  Issues/delays encountered (if applicable): none   Arturo Morton, PharmD, BCPS Please check AMION for all [facility] contact numbers Clinical Pharmacist 05/11/2022 9:13 AM

## 2022-05-11 NOTE — H&P (Addendum)
Neurology H&P  CC: right side numbness and tingling  History is obtained from:patient and medical record   HPI: Austin Dravon Nott. is a 72 y.o. male with past medical history of obesity, HTN, DM who presents to the [facility] ED via EMS for evaluation of right side numbness and tingling and dizziness.  He was also noted to be hypertensive in the ED. Code stroke was activated in the ED.  Per patient he awoke this am at 0700, with no deficits and Soon after awaking he noticed he was dizzy and had right side numbness and tingling. He took his BP meds 15 minutes prior to EMS arriving. He denies any weakness, trouble speaking or slurred speech. NIHSS 1 for sensation deficit on right hemibody.   LKW 0700. Code stroke CT head no acute process, ASPECTS 10. CTA head and neck no LVO. TNK was administered @ 0909.   Risks, benefits, alternatives of IV thrombolysis were discussed and patient agreed to proceed.  CT imaging was reviewed personally by Dr. Rory Percy prior to IV thrombolysis administration with no evidence of bleed.  At [place], patient had a sudden neurological change when he suddenly became globally aphasic, right side weakness, right side neglect and left gaze preference. Patient was taken back to CT for Stat CT head with no acute process. Patient sent to MRI for stat MRI brain which did not reveal an acute process. Will obtain EEG and ABG  LKW: 0700 tpa given?: yes @ 0909  Modified Rankin Scale: 0-Completely asymptomatic and back to baseline post- stroke  ROS: ROS was performed and is negative except as noted in the HPI.    Past Medical History:  Diagnosis Date   Diabetes ([place])    Hypertension    Hypertensive urgency 03/04/2020     Family History  Problem Relation Age of Onset   Diabetes Father      Social History:  reports that he quit smoking about 51 years ago. His smoking use included cigarettes. He has never used smokeless tobacco. He reports current alcohol use. He reports that he does  not currently use drugs.   Exam: Current vital signs: BP (!) 157/85   Pulse (!) 58   Temp 97.9 F (36.6 C) (Oral)   Resp 16   Ht 6\' 1"  (1.854 m)   Wt (!) 163.3 kg   SpO2 100%   BMI 47.50 kg/m  Vital signs in last 24 hours: Temp:  [97.9 F (36.6 C)] 97.9 F (36.6 C) (02/08 0934) Pulse Rate:  [56-58] 58 (02/08 0930) Resp:  [14-19] 16 (02/08 0930) BP: (157-192)/(85-103) 157/85 (02/08 0930) SpO2:  [100 %] 100 % (02/08 0930) Weight:  [163.3 kg] 163.3 kg (02/08 0906)  Physical Exam  Constitutional: Appears well-developed and well-nourished, obese.  Psych: Affect appropriate to situation Eyes: No scleral injection HENT: No OP obstrucion Head: Normocephalic.  Cardiovascular: Normal rate and regular rhythm.  Respiratory: Effort normal and breath sounds normal to anterior ascultation GI: Soft.  No distension. There is no tenderness.  Skin: WDI  Neuro: Mental Status: Patient is awake, alert, oriented to person, place, month, year, and situation. Patient is able to give a clear and coherent history. No signs of aphasia or neglect  Cranial Nerves: II: Visual Fields are full. Pupils are equal, round, and reactive to light.   III,IV, VI: EOMI without ptosis or diploplia.  V: Facial sensation is symmetric to temperature VII: Facial movement is symmetric.  VIII: hearing is intact to voice X: Uvula elevates symmetrically XI:  Shoulder shrug is symmetric. XII: tongue is midline without atrophy or fasciculations.  Motor: Tone is normal. Bulk is normal. 5/5 strength was present in all four extremities.  Sensory: Decreased on the right side  Cerebellar: FNF and HKS are intact bilaterally  NIHSS  1a Level of Conscious.: 0 1b LOC Questions: 0 1c LOC Commands: 0 2 Best Gaze: 0 3 Visual: 0 4 Facial Palsy: 0 5a Motor Arm - left: 0 5b Motor Arm - Right: 0 6a Motor Leg - Left: 0 6b Motor Leg - Right: 0 7 Limb Ataxia: 0 8 Sensory: 1 9 Best Language: 0 10 Dysarthria: 0 11  Extinct. and Inatten.: 0 TOTAL: 1  After neurological change 0950  NIHSS  1a Level of Conscious.: 0 1b LOC Questions: 2 1c LOC Commands: 2 2 Best Gaze: 1 3 Visual: 0 4 Facial Palsy: 1 5a Motor Arm - left: 0 5b Motor Arm - Right: 2 6a Motor Leg - Left: 0 6b Motor Leg - Right: 2 7 Limb Ataxia: 0 8 Sensory: 0 9 Best Language: 2 10 Dysarthria: 2 11 Extinct. and Inatten.: 1 TOTAL: 15  I have reviewed labs in epic and the results pertinent to this consultation are: K 33.4 CBG 126  I have reviewed the images obtained: Code Stroke CT head No acute cortically based infarct or acute intracranial hemorrhage identified. ASPECTS 10.  CTA head and neck  1. Negative for large vessel occlusion. But Positive for advanced and widespread intracranial atherosclerosis: - highly stenotic Right fetal PCA. - moderate tandem stenoses of the contralateral Left PCA. - mild bilateral ACA A1 stenosis, and moderate to severe stenosis Left ACA A2 and right A3. - up to moderate Right MCA M3/4 branch stenoses. - mild stenoses of the Basilar artery, ICA siphons.  2.  Aortic Atherosclerosis   Repeat CT head No acute intracranial abnormality. No interval change from the recently performed study.  MRI brain  1. Intermittently motion degraded exam. No acute intracranial abnormality identified. 2. 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  Primary Diagnosis:  Acute left hemisphere stroke   Secondary Diagnosis: Hypertension Emergency (SBP > 180 or DBP > 120 & end organ damage), Type 2 diabetes mellitus w/o complications, and Morbid Obesity(BMI > 40), Probable Sleep apnea  Recommendations: -Admit to Neuro ICU  - BP goal < 180/105 s/p TNK  - NS @ 75cc/hr  - HgbA1c, fasting lipid panel - MRI of the brain without contrast - Frequent neuro checks - Echocardiogram - Hold all Prophylactic therapy-Antiplatelet medications for 24  hours s/p TNK  - SCD's  - bedside swallow screen. If passes can start diet. If fails may need NGT placed - Risk factor modification - Telemetry monitoring - PT consult, OT consult, Speech consult - Stroke team to follow  Beulah Gandy DNP, [place]  [facility]   Attending Neurohospitalist Addendum Patient seen and examined with APP/Resident. Agree with the history and physical as documented above. Agree with the plan as documented, which I helped formulate. I have independently reviewed the chart, obtained history, review of systems and examined the patient.I have personally reviewed pertinent head/neck/spine imaging (CT/MRI). Please feel free to call with any questions.  -- Amie Portland, MD Neurologist [facility] Pager: [phone]  CRITICAL CARE ATTESTATION Performed by: Amie Portland, MD Total critical care time: 79 minutes Critical care time was exclusive of separately billable procedures and treating other patients and/or supervising APPs/Residents/Students Critical care was necessary to treat or  prevent imminent or life-threatening deterioration. This patient is critically ill and at significant risk for neurological worsening and/or death and care requires constant monitoring. Critical care was time spent personally by me on the following activities: development of treatment plan with patient and/or surrogate as well as nursing, discussions with consultants, evaluation of patient's response to treatment, examination of patient, obtaining history from patient or surrogate, ordering and performing treatments and interventions, ordering and review of laboratory studies, ordering and review of radiographic studies, pulse oximetry, re-evaluation of patient's condition, participation in multidisciplinary rounds and medical decision making of high complexity in the care of this patient.

## 2022-05-11 NOTE — Code Documentation (Addendum)
Mr Keil Pickering is a 72 yr old male with a PMH DM, HTN, obesity. Presenting to [facility] on 05/11/2022 for rt sided sensory loss and dizziness. He is from home where he was last known well when he awoke at 0700. Afterwards, he noted he was dizzy and had rt sided sensory loss. He is on no thinners.  Code stroke activated in ED. Labs/ CBG obtained, Airway cleared by EDP.    Pt to CT scanner. NIHSS 1at this time for rt sensory loss. CTNC neg for acute hemorrhage per Dr Rory Percy. TNK given at 0909, 25 mg. CTA obtained, which was neg for LVO per Dr Rory Percy.      Pt back to room 21. At [place], pt suddenly unable to speak or follow commands. MD notified, Stat CT obtained. Per Dr. Rory Percy, CT neg for acute hemorrhage. Pt still aphasic, w/ left gaze prefference and rt weakness. Pt taken to Stat MRI.     Pt will need q 15 minVS and NIHSS for 2 hr, then q 30 for 6 hr then hourly for 16 hrs. Bedside handoff with Lambert RN.    Pt taken to [facility] at 1045. Bedside handoff with Jerene Pitch RN complete.

## 2022-05-11 NOTE — Progress Notes (Signed)
STAT EEG complete - results pending. ? ?

## 2022-05-11 NOTE — Procedures (Signed)
Patient Name: Austin Mercer.  MRN: [id number]  Epilepsy Attending: Lora Havens  Referring Physician/Provider: August Albino, NP  Date: 05/11/2022 Duration: 23.38 mins  Patient history: 72 year old male who initially presented with right-sided numbness, tingling and dizziness. He suddenly became globally aphasic, had worsening right-sided weakness, right-sided neglect and left gaze preference.  EEG to evaluate for seizure.  Level of alertness: Awake,asleep  AEDs during EEG study: None  Technical aspects: This EEG study was done with scalp electrodes positioned according to the 10-20 International system of electrode placement. Electrical activity was reviewed with band pass filter of 1-70Hz , sensitivity of 7 uV/mm, display speed of 35mm/sec with a 60Hz  notched filter applied as appropriate. EEG data were recorded continuously and digitally stored.  Video monitoring was available and reviewed as appropriate.  Description: The posterior dominant rhythm consists of 8 Hz activity of moderate voltage (25-35 uV) seen predominantly in posterior head regions, symmetric and reactive to eye opening and eye closing. Sleep was characterized by vertex waves, sleep spindles (12 to 14 Hz), maximal frontocentral region.  EEG showed continuous generalized 3 to 7 Hz theta-delta slowing. Hyperventilation and photic stimulation were not performed.     ABNORMALITY - Continuous slow, generalized  IMPRESSION: This study is suggestive of moderate diffuse encephalopathy, nonspecific etiology. No seizures or epileptiform discharges were seen throughout the recording.  Karsyn Jamie Barbra Sarks

## 2022-05-12 ENCOUNTER — Other Ambulatory Visit: Payer: Self-pay

## 2022-05-12 ENCOUNTER — Inpatient Hospital Stay (HOSPITAL_COMMUNITY): Payer: Medicare PPO

## 2022-05-12 DIAGNOSIS — I639 Cerebral infarction, unspecified: Secondary | ICD-10-CM | POA: Diagnosis not present

## 2022-05-12 LAB — LIPID PANEL
Cholesterol: 112 mg/dL (ref 0–200)
HDL: 37 mg/dL — ABNORMAL LOW (ref >40)
LDL Cholesterol: 56 mg/dL (ref 0–99)
Total CHOL/HDL Ratio: 3 RATIO
Triglycerides: 93 mg/dL (ref <150)
VLDL: 19 mg/dL (ref 0–40)

## 2022-05-12 LAB — GLUCOSE, CAPILLARY
Glucose-Capillary: 143 mg/dL — ABNORMAL HIGH (ref 70–99)
Glucose-Capillary: 137 mg/dL — ABNORMAL HIGH (ref 70–99)
Glucose-Capillary: 132 mg/dL — ABNORMAL HIGH (ref 70–99)
Glucose-Capillary: 209 mg/dL — ABNORMAL HIGH (ref 70–99)

## 2022-05-12 MED ORDER — AMLODIPINE BESYLATE 10 MG PO TABS
10.0000 mg | ORAL_TABLET | Freq: Every day | ORAL | Status: DC
  Administered 2022-05-12 – 2022-05-13 (×2): 10 mg via ORAL
  Filled 2022-05-12 (×3): qty 1

## 2022-05-12 MED ORDER — CARVEDILOL 12.5 MG PO TABS: 25.0000 mg | ORAL_TABLET | Freq: Two times a day (BID) | ORAL | Status: DC

## 2022-05-12 MED ORDER — ASPIRIN 81 MG PO TBEC
81.0000 mg | DELAYED_RELEASE_TABLET | Freq: Every day | ORAL | Status: DC
  Administered 2022-05-12 – 2022-05-13 (×2): 81 mg via ORAL
  Filled 2022-05-12 (×2): qty 1

## 2022-05-12 MED ORDER — CARVEDILOL 3.125 MG PO TABS
3.1250 mg | ORAL_TABLET | Freq: Two times a day (BID) | ORAL | Status: DC
  Administered 2022-05-12 – 2022-05-13 (×2): 3.125 mg via ORAL
  Filled 2022-05-12 (×2): qty 1

## 2022-05-12 MED ORDER — HEPARIN SODIUM (PORCINE) 5000 UNIT/ML IJ SOLN
5000.0000 [IU] | Freq: Three times a day (TID) | INTRAMUSCULAR | Status: DC
  Administered 2022-05-12 – 2022-05-13 (×2): 5000 [IU] via SUBCUTANEOUS
  Filled 2022-05-12 (×2): qty 1

## 2022-05-12 MED ORDER — STROKE: EARLY STAGES OF RECOVERY BOOK
Status: AC
  Filled 2022-05-12: qty 1

## 2022-05-12 MED ORDER — PANTOPRAZOLE SODIUM 40 MG PO TBEC
40.0000 mg | DELAYED_RELEASE_TABLET | Freq: Every day | ORAL | Status: DC
  Administered 2022-05-12: 40 mg via ORAL
  Filled 2022-05-12: qty 1

## 2022-05-12 NOTE — Progress Notes (Signed)
  Transition of Care [facility]) Screening Note   Patient Details  Name: Austin Mercer. Date of Birth: Feb 01, 1951   [facility] CM/SW Contact:    Pollie Friar, RN Phone Number: 05/12/2022, 3:51 PM   Pt is from home. No f/u per PT/OT. Transition of Care Department [facility]) has reviewed patient. We will continue to monitor patient advancement through interdisciplinary progression rounds. If new patient transition needs arise, please place a TOC consult.

## 2022-05-12 NOTE — Progress Notes (Signed)
This RN attempted to call report to [facility] [address] at [phone]. Phone number left for RN to return call after lunch break.   Normajean Baxter, RN

## 2022-05-12 NOTE — Evaluation (Signed)
Occupational Therapy Evaluation Patient Details Name: Austin Mercer. MRN: [id number] DOB: Oct 31, 1950 Today's Date: 05/12/2022   History of Present Illness 72 y.o. male who presented to ED 2/8 with dizziness and R side numbness/tingling.  Initial CT negative. TNK was administered @ 0909. At [place], pt became globally aphasic, with right side weakness, right side neglect and left gaze. Stat CT negative. Stat MRI negative. Repeat CT 2/9 negative. PMH: obesity, HTN, DM   Clinical Impression   PT admitted with dizziness and R side numbness. Pt currently with functional limitiations due to the deficits listed below (see OT problem list). Pt at baseline is indep with all adls and drives. Pt expressing feeling back to baseline. Pt however reports hearing people in his bathroom and the room was fairly quiet. OT to see one more session to further assess cognitive deficits.  Pt will benefit from skilled OT to increase their independence and safety with adls and balance to allow discharge home.       Recommendations for follow up therapy are one component of a multi-disciplinary discharge planning process, led by the attending physician.  Recommendations may be updated based on patient status, additional functional criteria and insurance authorization.   Follow Up Recommendations  No OT follow up     Assistance Recommended at Discharge PRN  Patient can return home with the following Assist for transportation    Functional Status Assessment  Patient has had a recent decline in their functional status and demonstrates the ability to make significant improvements in function in a reasonable and predictable amount of time.  Equipment Recommendations  None recommended by OT    Recommendations for Other Services       Precautions / Restrictions Precautions Precautions: Fall Restrictions Weight Bearing Restrictions: No      Mobility Bed Mobility               General bed mobility comments:  in chair at time of arrival    Transfers Overall transfer level: Needs assistance Equipment used: None Transfers: Sit to/from Stand Sit to Stand: Min guard           General transfer comment: Pt using momentum to power up. Increased time      Balance Overall balance assessment: Needs assistance Sitting-balance support: No upper extremity supported, Feet supported Sitting balance-Leahy Scale: Good     Standing balance support: No upper extremity supported, During functional activity Standing balance-Leahy Scale: Fair                             ADL either performed or assessed with clinical judgement   ADL Overall ADL's : Needs assistance/impaired Eating/Feeding: Independent   Grooming: Independent   Upper Body Bathing: Independent   Lower Body Bathing: Minimal assistance   Upper Body Dressing : Independent   Lower Body Dressing: Minimal assistance   Toilet Transfer: Min guard;Grab bars Toilet Transfer Details (indicate cue type and reason): pt reports using sink surface at home baseline         Functional mobility during ADLs: Min guard General ADL Comments: pt needs redireciton during times of session and family states "he likes to joke around"     Vision Baseline Vision/History: 0 No visual deficits Patient Visual Report: No change from baseline       Perception     Praxis      Pertinent Vitals/Pain Pain Assessment Faces Pain Scale: No hurt     Hand  Dominance Right (Simultaneous filing. User may not have seen previous data.)   Extremity/Trunk Assessment Upper Extremity Assessment Upper Extremity Assessment: Overall WFL for tasks assessed   Lower Extremity Assessment Lower Extremity Assessment: RLE deficits/detail RLE Deficits / Details: baseline knee pain and limitations per patient. pt walks with a very wide gait patter   Cervical / Trunk Assessment Cervical / Trunk Assessment: Other exceptions Cervical / Trunk Exceptions:  body habitus   Communication Communication Communication: No difficulties   Cognition Arousal/Alertness: Awake/alert Behavior During Therapy: WFL for tasks assessed/performed Overall Cognitive Status: Impaired/Different from baseline Area of Impairment: Awareness                           Awareness: Anticipatory   General Comments: Pt reports hearing people in the bathroom door. pt states 'i can't go in there they are in there" after going in the bathroom he states "toby go on go Toby go" when questioned "whose Toby" pt states "oh thats me". At the conclusion of the session OT asking patient "would you prefer use call you Toby or Kartier?" pt laughing and says "toby is off roots. I just say that"     General Comments  VSS, pt pausing prior to transfering to commode after ambulation from chair within 15 ft.    Exercises     Shoulder Instructions      Home Living Family/patient expects to be discharged to:: Private residence Living Arrangements: Non-relatives/Friends Available Help at Discharge: Friend(s);Available 24 hours/day Type of Home: Apartment (2nd floor apartment) Home Access: Stairs to enter Technical brewer of Steps: flight Entrance Stairs-Rails: Right Home Layout: One level     Bathroom Shower/Tub: Teacher, early years/pre: Standard     Home Equipment: None   Additional Comments: no animals, sister also present at evaluation as (A)  Lives With: Friend(s)    Prior Functioning/Environment Prior Level of Function : Independent/Modified Independent;Driving             Mobility Comments: able to walk community distances, reports unable to work due to R knee not being "right"          OT Problem List: Impaired balance (sitting and/or standing);Decreased cognition;Decreased knowledge of use of DME or AE;Decreased knowledge of precautions;Cardiopulmonary status limiting activity;Obesity      OT Treatment/Interventions:      OT  Goals(Current goals can be found in the care plan section) Acute Rehab OT Goals Patient Stated Goal: to go home OT Goal Formulation: With patient Time For Goal Achievement: 05/19/22 Potential to Achieve Goals: Good  OT Frequency:      Co-evaluation              AM-PAC OT "6 Clicks" Daily Activity     Outcome Measure Help from another person eating meals?: None Help from another person taking care of personal grooming?: None Help from another person toileting, which includes using toliet, bedpan, or urinal?: A Little Help from another person bathing (including washing, rinsing, drying)?: A Little Help from another person to put on and taking off regular upper body clothing?: A Little Help from another person to put on and taking off regular lower body clothing?: A Little 6 Click Score: 20   End of Session Nurse Communication: Mobility status  Activity Tolerance: Patient tolerated treatment well Patient left: in chair;with call bell/phone within reach;with family/visitor present  OT Visit Diagnosis: Unsteadiness on feet (R26.81)  Time: [id number] OT Time Calculation (min): 13 min Charges:  OT General Charges $OT Visit: 1 Visit OT Evaluation $OT Eval Low Complexity: 1 Low   Brynn, OTR/L  Acute Rehabilitation Services Office: [phone] .   Jeri Modena 05/12/2022, 2:53 PM

## 2022-05-12 NOTE — Evaluation (Signed)
Physical Therapy Evaluation Patient Details Name: Austin Mercer. MRN: [id number] DOB: 1950-05-03 Today's Date: 05/12/2022  History of Present Illness  Pt is a 72 y.o. male who presented to ED 2/8 with dizziness and R side numbness/tingling.  Initial CT negative. TNK was administered @ 0909. At [place], pt became globally aphasic, with right side weakness, right side neglect and left gaze. Stat CT negative. Stat MRI negative. Repeat CT 2/9 negative. PMH: obesity, HTN, DM   Clinical Impression  Pt admitted with above diagnosis. PTA pt lived with a friend in 2nd floor apartment, independent and driving. Pt currently with functional limitations due to the deficits listed below (see PT Problem List). On eval, he demonstrated mod I bed mobility. Min guard assist needed for transfers and amb 200' without AD. He presents with wide BOS and shuffle gait pattern. Pt reports this is his baseline. He also reports frequent R knee pain but rates as 0/10 at time of eval. Pt will benefit from skilled PT to increase their independence and safety with mobility to allow discharge home. PT to follow acutely. No follow up services indicated.          Recommendations for follow up therapy are one component of a multi-disciplinary discharge planning process, led by the attending physician.  Recommendations may be updated based on patient status, additional functional criteria and insurance authorization.  Follow Up Recommendations No PT follow up      Assistance Recommended at Discharge PRN  Patient can return home with the following       Equipment Recommendations None recommended by PT  Recommendations for Other Services       Functional Status Assessment Patient has had a recent decline in their functional status and demonstrates the ability to make significant improvements in function in a reasonable and predictable amount of time.     Precautions / Restrictions Precautions Precautions:  Fall Restrictions Weight Bearing Restrictions: No      Mobility  Bed Mobility Overal bed mobility: Modified Independent             General bed mobility comments: increased time, +rail    Transfers Overall transfer level: Needs assistance Equipment used: None Transfers: Sit to/from Stand Sit to Stand: Min guard           General transfer comment: Pt using momentum to power up. Increased time    Ambulation/Gait Ambulation/Gait assistance: Min guard Gait Distance (Feet): 200 Feet Assistive device: None Gait Pattern/deviations: Step-through pattern, Decreased stride length, Shuffle, Wide base of support Gait velocity: decreased Gait velocity interpretation: 1.31 - 2.62 ft/sec, indicative of limited community ambulator   General Gait Details: min guard for safety. Pt reports this gait pattern is his baseline.  Stairs            Wheelchair Mobility    Modified Rankin (Stroke Patients Only) Modified Rankin (Stroke Patients Only) Pre-Morbid Rankin Score: No symptoms Modified Rankin: No significant disability     Balance Overall balance assessment: Needs assistance Sitting-balance support: No upper extremity supported, Feet supported Sitting balance-Leahy Scale: Good     Standing balance support: No upper extremity supported, During functional activity Standing balance-Leahy Scale: Fair                               Pertinent Vitals/Pain Pain Assessment Pain Assessment: No/denies pain    Home Living Family/patient expects to be discharged to:: Private residence Living Arrangements: Non-relatives/Friends Available Help  at Discharge: Friend(s);Available 24 hours/day Type of Home: Apartment (2nd floor apartment) Home Access: Stairs to enter Entrance Stairs-Rails: Right Entrance Stairs-Number of Steps: flight   Home Layout: One level Home Equipment: None      Prior Function Prior Level of Function : Independent/Modified  Independent;Driving             Mobility Comments: able to walk community distances       Hand Dominance        Extremity/Trunk Assessment   Upper Extremity Assessment Upper Extremity Assessment: Defer to OT evaluation    Lower Extremity Assessment Lower Extremity Assessment: Overall WFL for tasks assessed    Cervical / Trunk Assessment Cervical / Trunk Assessment: Other exceptions Cervical / Trunk Exceptions: body habitus  Communication   Communication: No difficulties  Cognition Arousal/Alertness: Awake/alert Behavior During Therapy: WFL for tasks assessed/performed Overall Cognitive Status: Within Functional Limits for tasks assessed                                 General Comments: poor insight. Repeatedly asking "when can I go home?"        General Comments General comments (skin integrity, edema, etc.): Pt reports frequent R knee pain, which at times requires seated rest breaks. Pt reports 0/10 knee pain at time of PT eval.    Exercises     Assessment/Plan    PT Assessment Patient needs continued PT services  PT Problem List Decreased balance;Decreased mobility;Decreased activity tolerance       PT Treatment Interventions Stair training;Therapeutic exercise;Gait training;Therapeutic activities;Balance training;Functional mobility training;Patient/family education    PT Goals (Current goals can be found in the Care Plan section)  Acute Rehab PT Goals Patient Stated Goal: home ASAP PT Goal Formulation: With patient Time For Goal Achievement: 05/26/22 Potential to Achieve Goals: Good    Frequency Min 4X/week     Co-evaluation               AM-PAC PT "6 Clicks" Mobility  Outcome Measure Help needed turning from your back to your side while in a flat bed without using bedrails?: None Help needed moving from lying on your back to sitting on the side of a flat bed without using bedrails?: None Help needed moving to and from a  bed to a chair (including a wheelchair)?: A Little Help needed standing up from a chair using your arms (e.g., wheelchair or bedside chair)?: A Little Help needed to walk in hospital room?: A Little Help needed climbing 3-5 steps with a railing? : A Little 6 Click Score: 20    End of Session Equipment Utilized During Treatment: Gait belt Activity Tolerance: Patient tolerated treatment well Patient left: in chair;with call bell/phone within reach Nurse Communication: Mobility status PT Visit Diagnosis: Difficulty in walking, not elsewhere classified (R26.2)    Time: [id number] PT Time Calculation (min) (ACUTE ONLY): 23 min   Charges:   PT Evaluation $PT Eval Moderate Complexity: 1 Mod PT Treatments $Gait Training: 8-22 mins        Gloriann Loan., PT  Office # [phone]   Lorriane Shire 05/12/2022, 11:38 AM

## 2022-05-12 NOTE — Progress Notes (Addendum)
STROKE TEAM PROGRESS NOTE   INTERVAL HISTORY CT head repeated this morning with no acute intracranial abnormality. Home norvasc and coreg resumed. Will also start a daily aspirin. Awaiting SLP cognitive eval and will transfer out of ICU today. Neurologically stable.   Vitals:   05/12/22 0400 05/12/22 0500 05/12/22 0600 05/12/22 0700  BP: (!) 154/80 (!) 161/90 132/64 (!) 157/75  Pulse: (!) 59 64 61 75  Resp: (!) 21 15 14 14  $ Temp: 97.8 F (36.6 C)     TempSrc: Oral     SpO2: 97% 96% 95% 98%  Weight:      Height:       CBC:  Recent Labs  Lab 05/11/22 0849 05/11/22 0858 05/11/22 1119  WBC 5.0  --   --   NEUTROABS 1.6*  --   --   HGB 13.2 13.6 13.3  HCT 39.8 40.0 39.0  MCV 80.7  --   --   PLT 136*  --   --    Basic Metabolic Panel:  Recent Labs  Lab 05/11/22 0849 05/11/22 0858 05/11/22 1119  NA 137 137 136  K 3.4* 4.3 4.0  CL 113* 106  --   CO2 19*  --   --   GLUCOSE 113* 126*  --   BUN 14 17  --   CREATININE 1.08 1.30*  --   CALCIUM 8.5*  --   --    Lipid Panel:  Recent Labs  Lab 05/12/22 0338  CHOL 112  TRIG 93  HDL 37*  CHOLHDL 3.0  VLDL 19  LDLCALC 56   HgbA1c: No results for input(s): "HGBA1C" in the last 168 hours. Urine Drug Screen:  Recent Labs  Lab 05/11/22 0945  LABOPIA NONE DETECTED  COCAINSCRNUR NONE DETECTED  LABBENZ NONE DETECTED  AMPHETMU NONE DETECTED  THCU NONE DETECTED  LABBARB NONE DETECTED    Alcohol Level  Recent Labs  Lab 05/11/22 0849  ETH <10    IMAGING past 24 hours ECHOCARDIOGRAM COMPLETE  Result Date: 05/11/2022    ECHOCARDIOGRAM REPORT   Patient Name:   Austin Mercer. Date of Exam: 05/11/2022 Medical Rec #:  [id number]        Height:       73.0 in Accession #:    [id number]       Weight:       360.0 lb Date of Birth:  [birth year]        BSA:          2.762 m Patient Age:    72 years         BP:           195/104 mmHg Patient Gender: M                HR:           60 bpm. Exam Location:  Inpatient Procedure: 2D Echo,  Cardiac Doppler and Color Doppler Indications:    Stroke  History:        Patient has no prior history of Echocardiogram examinations.                 Abnormal ECG, Stroke, Arrythmias:AV block,                 Signs/Symptoms:Dizziness/Lightheadedness; Risk                 Factors:Hypertension and Diabetes. ETOH.  Sonographer:    Roseanna Rainbow RDCS Referring Phys: Amie Portland  Sonographer Comments: Technically  difficult study due to poor echo windows and patient is obese. Image acquisition challenging due to patient body habitus. IMPRESSIONS  1. Left ventricular ejection fraction, by estimation, is 55 to 60%. The left ventricle has normal function. The left ventricle has no regional wall motion abnormalities. There is moderate left ventricular hypertrophy. Left ventricular diastolic parameters are consistent with Grade I diastolic dysfunction (impaired relaxation).  2. Right ventricular systolic function is normal. The right ventricular size is normal.  3. The mitral valve is normal in structure. Mild mitral valve regurgitation. No evidence of mitral stenosis.  4. The aortic valve has an indeterminant number of cusps. Aortic valve regurgitation is moderate. Mild aortic valve stenosis.  5. Aortic dilatation noted. There is borderline dilatation of the aortic root, measuring 39 mm.  6. The inferior vena cava is normal in size with greater than 50% respiratory variability, suggesting right atrial pressure of 3 mmHg. Comparison(s): No prior Echocardiogram. FINDINGS  Left Ventricle: Left ventricular ejection fraction, by estimation, is 55 to 60%. The left ventricle has normal function. The left ventricle has no regional wall motion abnormalities. The left ventricular internal cavity size was normal in size. There is  moderate left ventricular hypertrophy. Left ventricular diastolic parameters are consistent with Grade I diastolic dysfunction (impaired relaxation). Right Ventricle: The right ventricular size is normal. Right  ventricular systolic function is normal. Left Atrium: Left atrial size was normal in size. Right Atrium: Right atrial size was normal in size. Pericardium: Trivial pericardial effusion is present. Mitral Valve: The mitral valve is normal in structure. Mild mitral valve regurgitation. No evidence of mitral valve stenosis. Tricuspid Valve: The tricuspid valve is normal in structure. Tricuspid valve regurgitation is trivial. No evidence of tricuspid stenosis. Aortic Valve: The aortic valve has an indeterminant number of cusps. Aortic valve regurgitation is moderate. Aortic regurgitation PHT measures 709 msec. Mild aortic stenosis is present. Aortic valve mean gradient measures 16.0 mmHg. Aortic valve peak gradient measures 30.6 mmHg. Aortic valve area, by VTI measures 2.89 cm. Pulmonic Valve: The pulmonic valve was not well visualized. Pulmonic valve regurgitation is not visualized. No evidence of pulmonic stenosis. Aorta: Aortic dilatation noted. There is borderline dilatation of the aortic root, measuring 39 mm. Venous: The inferior vena cava is normal in size with greater than 50% respiratory variability, suggesting right atrial pressure of 3 mmHg. IAS/Shunts: No atrial level shunt detected by color flow Doppler.  LEFT VENTRICLE PLAX 2D LVIDd:         5.10 cm      Diastology LVIDs:         3.30 cm      LV e' medial:    5.98 cm/s LV PW:         1.40 cm      LV E/e' medial:  12.5 LV IVS:        1.80 cm      LV e' lateral:   6.96 cm/s LVOT diam:     2.80 cm      LV E/e' lateral: 10.7 LV SV:         166 LV SV Index:   60 LVOT Area:     6.16 cm  LV Volumes (MOD) LV vol d, MOD A2C: 229.0 ml LV vol d, MOD A4C: 176.0 ml LV vol s, MOD A2C: 71.5 ml LV vol s, MOD A4C: 72.7 ml LV SV MOD A2C:     157.5 ml LV SV MOD A4C:     176.0 ml LV SV  MOD BP:      140.8 ml RIGHT VENTRICLE             IVC RV S prime:     16.90 cm/s  IVC diam: 2.10 cm TAPSE (M-mode): 1.9 cm LEFT ATRIUM             Index        RIGHT ATRIUM           Index LA  diam:        3.60 cm 1.30 cm/m   RA Area:     12.50 cm LA Vol (A2C):   78.0 ml 28.24 ml/m  RA Volume:   25.50 ml  9.23 ml/m LA Vol (A4C):   60.7 ml 21.98 ml/m LA Biplane Vol: 69.9 ml 25.31 ml/m  AORTIC VALVE AV Area (Vmax):    2.83 cm AV Area (Vmean):   2.74 cm AV Area (VTI):     2.89 cm AV Vmax:           276.67 cm/s AV Vmean:          182.667 cm/s AV VTI:            0.573 m AV Peak Grad:      30.6 mmHg AV Mean Grad:      16.0 mmHg LVOT Vmax:         127.00 cm/s LVOT Vmean:        81.400 cm/s LVOT VTI:          0.269 m LVOT/AV VTI ratio: 0.47 AI PHT:            709 msec  AORTA Ao Root diam: 3.90 cm Ao Asc diam:  3.80 cm MITRAL VALVE MV Area (PHT): 2.80 cm     SHUNTS MV Decel Time: 271 msec     Systemic VTI:  0.27 m MV E velocity: 74.80 cm/s   Systemic Diam: 2.80 cm MV A velocity: 101.15 cm/s MV E/A ratio:  0.74 Kirk Ruths MD Electronically signed by Kirk Ruths MD Signature Date/Time: 05/11/2022/1:48:03 PM    Final    EEG adult  Result Date: 05/11/2022 Lora Havens, MD     05/11/2022 12:16 PM Patient Name: Austin Mercer. MRN: [id number] Epilepsy Attending: Lora Havens Referring Physician/Provider: August Albino, NP Date: 05/11/2022 Duration: 23.38 mins Patient history: 72 year old male who initially presented with right-sided numbness, tingling and dizziness. He suddenly became globally aphasic, had worsening right-sided weakness, right-sided neglect and left gaze preference.  EEG to evaluate for seizure. Level of alertness: Awake,asleep AEDs during EEG study: None Technical aspects: This EEG study was done with scalp electrodes positioned according to the 10-20 International system of electrode placement. Electrical activity was reviewed with band pass filter of 1-70Hz$ , sensitivity of 7 uV/mm, display speed of 1m/sec with a 60Hz$  notched filter applied as appropriate. EEG data were recorded continuously and digitally stored.  Video monitoring was available and reviewed as appropriate.  Description: The posterior dominant rhythm consists of 8 Hz activity of moderate voltage (25-35 uV) seen predominantly in posterior head regions, symmetric and reactive to eye opening and eye closing. Sleep was characterized by vertex waves, sleep spindles (12 to 14 Hz), maximal frontocentral region.  EEG showed continuous generalized 3 to 7 Hz theta-delta slowing. Hyperventilation and photic stimulation were not performed.   ABNORMALITY - Continuous slow, generalized IMPRESSION: This study is suggestive of moderate diffuse encephalopathy, nonspecific etiology. No seizures or epileptiform discharges were seen throughout the recording. Priyanka O  Hortense Ramal   MR BRAIN WO CONTRAST  Result Date: 05/11/2022 CLINICAL DATA:  72 year old male code stroke presentation status post TNK for right side deficits. EXAM: MRI HEAD WITHOUT CONTRAST TECHNIQUE: Multiplanar, multiecho pulse sequences of the brain and surrounding structures were obtained without intravenous contrast. COMPARISON:  CTA head and neck, head CTs today. FINDINGS: Brain: Study is mildly degraded by motion artifact despite repeated imaging attempts. No convincing diffusion restriction. 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 Mild midbrain Wallerian degeneration. Mild contralateral left deep gray nuclei chronic small vessel disease. Scattered mild additional bilateral white matter T2 and FLAIR hyperintensity. No cortical encephalomalacia or chronic cerebral blood products identified. No midline shift, mass effect, evidence of mass lesion, ventriculomegaly, extra-axial collection or acute intracranial hemorrhage. Cervicomedullary junction and pituitary are within normal limits. Vascular: Major intracranial vascular flow voids are preserved. Skull and upper cervical spine: Negative. Sinuses/Orbits: Coronal T2 weighted imaging not provided. Orbit motion artifact. Paranasal sinuses and mastoids are stable and well aerated. Other: Grossly negative  visible internal auditory structures. IMPRESSION: 1. Intermittently motion degraded exam. No acute intracranial abnormality identified. 2. 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 Electronically Signed   By: Genevie Ann M.D.   On: 05/11/2022 10:42   CT HEAD WO CONTRAST (5MM)  Result Date: 05/11/2022 CLINICAL DATA:  Hypertension EXAM: CT HEAD WITHOUT CONTRAST TECHNIQUE: Contiguous axial images were obtained from the base of the skull through the vertex without intravenous contrast. RADIATION DOSE REDUCTION: This exam was performed according to the departmental dose-optimization program which includes automated exposure control, adjustment of the mA and/or kV according to patient size and/or use of iterative reconstruction technique. COMPARISON:  Same day CT at 0902 hours FINDINGS: Brain: No evidence of acute infarction, hemorrhage, hydrocephalus, extra-axial collection or mass lesion/mass effect. Moderate low-density changes within the periventricular and subcortical white matter compatible with chronic microvascular ischemic change. Mild diffuse cerebral volume loss. Vascular: Atherosclerotic calcifications involving the large vessels of the skull base. No unexpected hyperdense vessel. Skull: Normal. Negative for fracture or focal lesion. Sinuses/Orbits: No acute finding. Other: None. IMPRESSION: No acute intracranial abnormality. No interval change from the recently performed study. Electronically Signed   By: Davina Poke D.O.   On: 05/11/2022 10:01   CT ANGIO HEAD NECK W WO CM (CODE STROKE)  Result Date: 05/11/2022 CLINICAL DATA:  Code stroke. 72 year old male right side deficit. Status post TNK. EXAM: CT ANGIOGRAPHY HEAD AND NECK TECHNIQUE: Multidetector CT imaging of the head and neck was performed using the standard protocol during bolus administration of intravenous contrast. Multiplanar CT image reconstructions and MIPs were  obtained to evaluate the vascular anatomy. Carotid stenosis measurements (when applicable) are obtained utilizing NASCET criteria, using the distal internal carotid diameter as the denominator. RADIATION DOSE REDUCTION: This exam was performed according to the departmental dose-optimization program which includes automated exposure control, adjustment of the mA and/or kV according to patient size and/or use of iterative reconstruction technique. CONTRAST:  51m OMNIPAQUE IOHEXOL 350 MG/ML SOLN COMPARISON:  Plain head CT this morning. FINDINGS: CTA NECK Skeleton: Widespread cervical spine degeneration, bulky disc and endplate disease. No acute osseous abnormality identified. Upper chest: Negative lung apices. Visible superior mediastinal lymph nodes are within normal limits. Other neck: Retropharyngeal course of both carotid arteries in the neck. Aortic arch: Calcified aortic atherosclerosis. Bovine arch configuration. Right carotid system: No brachiocephalic artery plaque or stenosis. Tortuous right CCA origin without stenosis. Retropharyngeal right CCA and  right carotid bifurcation with only mild right ICA plaque and no stenosis. Left carotid system: Bovine left CCA origin with no plaque or stenosis. Similar tortuosity and retropharyngeal course with only mild calcified plaque at the bifurcation more affecting the ECA, no stenosis. Vertebral arteries: Calcified plaque and tortuosity of the right subclavian artery origin without significant stenosis. Right vertebral artery origin is patent without significant stenosis. Right vertebral artery is patent to the skull base with no significant plaque or stenosis. Proximal left subclavian artery and left vertebral artery origin are patent with no significant plaque or stenosis. Tortuous left V1 segment. Codominant left vertebral arteries patent to the skull base with no significant stenosis. CTA HEAD Posterior circulation: Codominant V4 segments and vertebrobasilar  junction are patent with no significant plaque or stenosis. Bilateral AICA appear dominant, patent. Basilar artery is patent although with long segment mild proximal and mid basilar stenosis, series 10, image 32. Mild fusiform enlargement of the basilar tip. Basilar tip, SCA and left PCA origins are patent. Fetal type right PCA origin with atherosclerotic and stenotic right posterior communicating artery. Furthermore, the right PCA P2 segment is irregular and stenotic. See series 8, image 19. Distal right PCA branches remain patent. Contralateral up to moderate left PCA origin irregularity and stenosis. Superimposed moderate distal left P2 segment stenosis, and mild-to-moderate P3 superior division stenosis. Anterior circulation: Both ICA siphons are patent. Up to moderate calcified left siphon plaque but no significant left siphon stenosis. Similar moderate right siphon plaque, but only mild supraclinoid right siphon stenosis. Patent carotid termini, MCA and ACA origins. Mild to moderate bilateral A1 segment irregularity and stenosis slightly greater on the right (series 10, image 30). Diminutive or absent anterior communicating artery. There is moderate to severe left ACA A2 segment stenosis on series 12, image 23 but the distal ACA branches appear to remain patent. Up to moderate distal right ACA branch irregularity and stenosis on series 12, image 23 also. Left MCA M1 segment is mildly irregular but patent to the bifurcation without significant stenosis. No left MCA branch occlusion is identified. Mild left MCA branch irregularity. Contralateral right MCA M1 is mildly tortuous and irregular without stenosis. Patent right MCA bifurcation. Mild M2 but moderate posterior right M3 or M4 branch irregularity and stenosis on series 12, image 15. Venous sinuses: Patent. Anatomic variants: Bovine arch configuration. Fetal type right PCA origin. Review of the MIP images confirms the above findings IMPRESSION: 1. Negative  for large vessel occlusion. But Positive for advanced and widespread intracranial atherosclerosis: - highly stenotic Right fetal PCA. - moderate tandem stenoses of the contralateral Left PCA. - mild bilateral ACA A1 stenosis, and moderate to severe stenosis Left ACA A2 and right A3. - up to moderate Right MCA M3/4 branch stenoses. - mild stenoses of the Basilar artery, ICA siphons. 2.  Aortic Atherosclerosis (ICD10-I70.0). Salient findings Study discussed by telephone with Dr. Rory Percy on 05/11/2022 at 09:35. At 0924 hours Electronically Signed   By: Genevie Ann M.D.   On: 05/11/2022 09:37   CT HEAD CODE STROKE WO CONTRAST  Result Date: 05/11/2022 CLINICAL DATA:  Code stroke.  72 year old male right side deficit. EXAM: CT HEAD WITHOUT CONTRAST TECHNIQUE: Contiguous axial images were obtained from the base of the skull through the vertex without intravenous contrast. RADIATION DOSE REDUCTION: This exam was performed according to the departmental dose-optimization program which includes automated exposure control, adjustment of the mA and/or kV according to patient size and/or use of iterative reconstruction technique. COMPARISON:  Head  CT 12/30/2011. FINDINGS: Brain: Cerebral volume loss since 2013 along with substantially progressed but chronic appearing small vessel ischemia in the right basal ganglia and internal capsule. Chronic appearing cystic encephalomalacia there. Asymmetric additional patchy cerebral white matter hypodensity has increased but is age indeterminate. No superimposed acute cortically based infarct identified. Vascular: Calcified atherosclerosis at the skull base. Skull: Negative. Sinuses/Orbits: Visualized paranasal sinuses and mastoids are stable and well aerated. Other: Orbits and scalp appears stable and essentially negative, there is a tiny midline forehead benign scalp lipoma. ASPECTS [facility] Stroke Program Early CT Score) Total score (0-10 with 10 being normal): 10 IMPRESSION: 1. No acute  cortically based infarct or acute intracranial hemorrhage identified. ASPECTS 10. 2. But substantially new/progressed small vessel disease since 2013, especially in the right basal ganglia and internal capsule. 3. These results were communicated to Dr. Rory Percy at 9:12 am on 05/11/2022 by text page via the [facility] messaging system. Electronically Signed   By: Genevie Ann M.D.   On: 05/11/2022 09:13    PHYSICAL EXAM  Physical Exam  Constitutional: Appears well-developed and well-nourished.   Cardiovascular: Normal rate and regular rhythm.  Respiratory: Effort normal, non-labored breathing  Neuro: Mental Status: Patient is awake, alert, oriented to person, place, month, year, and situation. Patient is able to give a clear and coherent history. No signs of aphasia or neglect Cranial Nerves: II: Visual Fields are full. Pupils are equal, round, and reactive to light.   III,IV, VI: EOMI without ptosis or diploplia.  V: Facial sensation is symmetric to temperature VII: Facial movement is symmetric resting and smiling VIII: Hearing is intact to voice X: Palate elevates symmetrically XI: Shoulder shrug is symmetric. XII: Tongue protrudes midline without atrophy or fasciculations.  Motor: Tone is normal. Bulk is normal. 5/5 strength was present in all four extremities.  Sensory: Sensation is symmetric to light touch in the arms and legs.  Cerebellar: FNF and HKS are intact bilaterally   ASSESSMENT/PLAN Mr. Austin Mercer. is a 72 y.o. male with history of obesity, HTN, DM who presents to the [facility] ED via EMS for evaluation of right side numbness and tingling and dizziness. At [place], patient had a sudden neurological change when he suddenly became globally aphasic, right side weakness, right side neglect and left gaze preference. Patient was taken back to CT for Stat CT head with no acute process. Patient sent to MRI for stat MRI brain which did not reveal an acute process.   TIA vs Aborted stroke Code  Stroke CT head No acute abnormality. ASPECTS 10.    CTA head & neck Negative for large vessel occlusion. But Positive for advanced and widespread intracranial atherosclerosis: highly stenotic Right fetal PCA. moderate tandem stenoses of the contralateral Left PCA. mild bilateral ACA A1 stenosis, and moderate to severe stenosis. Left ACA A2 and right A3. up to moderate Right MCA M3/4 branch stenoses. mild stenoses of the Basilar artery, ICA siphons. MRI  Intermittently motion degraded exam. No acute intracranial abnormality identified. 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 Repeat Head CT- No acute intracranial abnormality 2D Echo- EF 55-60% EEG- This study is suggestive of moderate diffuse encephalopathy, nonspecific etiology. No seizures or epileptiform discharges were seen throughout the recording.  LDL 56 HgbA1c 6.4 VTE prophylaxis - heparin    Diet   Diet Carb Modified Fluid consistency: Thin; Room service appropriate? Yes   No antithrombotic prior to admission, now on aspirin 81 mg daily.  Therapy recommendations:  no follow up Disposition:  home with family   Hypertension Home meds:  norvasc, coreg, losartan, lasix, spironolactone,  Stable Permissive hypertension (OK if < 220/120) but gradually normalize in 5-7 days Long-term BP goal normotensive  Hyperlipidemia Home meds:  lipitor 51m, resumed in hospital LDL 56, goal < 70 Continue statin at discharge  Other Stroke Risk Factors Advanced Age >/= 673 Obesity, Body mass index is 47.5 kg/m., BMI >/= 30 associated with increased stroke risk, recommend weight loss, diet and exercise as appropriate  Congestive heart failure Lasix, spironolactone, farxiga   Other Active Problems   Hospital day # 1  Patient seen and examined by NP/APP with MD. MD to update note as needed.   DJanine Ores DNP, FNP-BC [facility] Pager: [phone] ATTENDING ATTESTATION:  72year old with history of hypertension diabetes and likely baseline dementia likely vascular disease white matter lesions on MRI came in as a code stroke status post successful TNK as MRI is negative.  Diagnosis MRI negative stroke.  Post TNKase CT negative.  Will transfer out of the ICU start Coreg and Norvasc for blood pressure control.  Will likely be able to go home tomorrow pending completion of therapy evaluation.  Dr. PReeves Forthevaluated pt independently, reviewed imaging, chart, labs. Discussed and formulated plan with the Resident/APP. Changes were made to the note where appropriate. Please see APP/resident note above for details.    Austin Hackbart,MD   To contact Stroke Continuity provider, please refer to [email or web address] After hours, contact General Neurology

## 2022-05-12 NOTE — Evaluation (Signed)
Speech Language Pathology Evaluation Patient Details Name: Austin Mercer. 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[place]) 10/21/2021   Allergic rhinitis 10/18/2021   Hypophosphatemia 09/14/2021   Liver cirrhosis ([place]) 09/12/2021   Moderate episode of recurrent major depressive disorder ([place]) 09/09/2020   Toenail fungus [id number]   Chronic systolic heart failure ([place]) 03/11/2020   First degree AV block 03/04/2020   PVC (premature ventricular contraction) 03/04/2020   Depression 03/04/2020   History of alcohol use disorder 04/26/2018   Erectile dysfunction of organic origin 04/26/2018   Positive for macroalbuminuria 01/27/2018   Hypercalcemia 01/24/2018   Controlled type 2 diabetes mellitus without complication, without long-term current use of insulin ([place]) 11/23/2017   Essential hypertension 11/23/2017   Primary osteoarthritis of right knee 11/23/2017   Class 3 severe obesity due to excess calories with serious comorbidity and body mass index (BMI) of 45.0 to 49.9 in adult ([place]) 11/23/2017   Age-related cataract of right eye 11/23/2017   Hepatitis C virus infection cured after antiviral drug therapy 11/23/2017   Chronic, continuous use of opioids 11/23/2017   Past Medical History:  Past Medical History:  Diagnosis Date   Diabetes ([place])    Hypertension    Hypertensive urgency 03/04/2020   Past Surgical History:  Past Surgical History:  Procedure Laterality Date   ANKLE FRACTURE SURGERY     HPI:  Pt is a 72 yo male presenting to ED 2/8 with dizziness and R side numbness/tingling. Initial CT Head negative. TNK administered in ED (0909) and became globally aphasic at 0945, with R side weakness, R side neglect, and L gaze preference. Stat CT  Head and MRI negative. Repeat CT Head 2/9 negative. PMH includes obesity, DM, and HTN.   Assessment / Plan / Recommendation Clinical Impression  Pt reports that he is at his cognitive baseline which his sister and roommate disagree with. They state that it seems his attention is impaired and that they have noted some new onset memory changes over the last year. Prior to admission, pt lived and handled finances with minimal help from roommate. Pt scored a 16/30 on the SLUMS with errors consistent with deficits in attention, awareness, memory, and problem solving. Pt seemed to lack awareness of cognitive changes and their impact after d/c. Since no acute findings were present on Brain MRI, suspect this is comparable to his performance at baseline. SLP provided education regarding increased supervision with medications following d/c and possible f/u with PCP due to cognitive changes prior to admission. Recommend no SLP f/u at this time.    SLP Assessment  SLP Recommendation/Assessment: Patient does not need any further Speech [facility] SLP Visit Diagnosis: Cognitive communication deficit (R41.841)    Recommendations for follow up therapy are one component of a multi-disciplinary discharge planning process, led by the attending physician.  Recommendations may be updated based on patient status, additional functional criteria and insurance authorization.    Follow Up Recommendations  No SLP follow up    Assistance Recommended at Discharge  Intermittent Supervision/Assistance  Functional Status Assessment Patient has had a recent decline in their functional status and demonstrates the ability to make significant improvements in function in a reasonable and predictable amount of time.  Frequency and Duration  SLP Evaluation Cognition  Overall Cognitive Status: History of cognitive impairments - at baseline Arousal/Alertness: Awake/alert Orientation Level: Oriented  X4 Attention: Selective;Sustained Sustained Attention: Impaired Sustained Attention Impairment: Verbal complex;Verbal basic Selective Attention: Impaired Selective Attention Impairment: Verbal complex;Verbal basic Memory: Impaired Memory Impairment: Retrieval deficit Awareness: Impaired Awareness Impairment: Intellectual impairment Problem Solving: Impaired Problem Solving Impairment: Verbal complex Executive Function: Self Monitoring Self Monitoring: Impaired Self Monitoring Impairment: Verbal complex Safety/Judgment: Impaired       Comprehension  Auditory Comprehension Overall Auditory Comprehension: Appears within functional limits for tasks assessed Visual Recognition/Discrimination Discrimination: Not tested Reading Comprehension Reading Status: Not tested    Expression Expression Primary Mode of Expression: Verbal Verbal Expression Overall Verbal Expression: Appears within functional limits for tasks assessed Written Expression Dominant Hand: Right (Simultaneous filing. User may not have seen previous data.) Written Expression: Not tested   Oral / Motor  Motor Speech Overall Motor Speech: Appears within functional limits for tasks assessed            Fabio Asa., Student SLP  05/12/2022, 3:19 PM

## 2022-05-13 ENCOUNTER — Inpatient Hospital Stay (HOSPITAL_COMMUNITY): Payer: Medicare PPO

## 2022-05-13 DIAGNOSIS — I639 Cerebral infarction, unspecified: Secondary | ICD-10-CM | POA: Diagnosis not present

## 2022-05-13 DIAGNOSIS — I5022 Chronic systolic (congestive) heart failure: Secondary | ICD-10-CM | POA: Diagnosis not present

## 2022-05-13 LAB — BASIC METABOLIC PANEL
Anion gap: 9 (ref 5–15)
BUN: 19 mg/dL (ref 8–23)
CO2: 20 mmol/L — ABNORMAL LOW (ref 22–32)
Calcium: 10.4 mg/dL — ABNORMAL HIGH (ref 8.9–10.3)
Chloride: 102 mmol/L (ref 98–111)
Creatinine, Ser: 1.32 mg/dL — ABNORMAL HIGH (ref 0.61–1.24)
GFR, Estimated: 58 mL/min — ABNORMAL LOW (ref >60)
Glucose, Bld: 127 mg/dL — ABNORMAL HIGH (ref 70–99)
Potassium: 4 mmol/L (ref 3.5–5.1)
Sodium: 131 mmol/L — ABNORMAL LOW (ref 135–145)

## 2022-05-13 LAB — GLUCOSE, CAPILLARY
Glucose-Capillary: 136 mg/dL — ABNORMAL HIGH (ref 70–99)
Glucose-Capillary: 106 mg/dL — ABNORMAL HIGH (ref 70–99)

## 2022-05-13 MED ORDER — SPIRONOLACTONE 25 MG PO TABS
25.0000 mg | ORAL_TABLET | Freq: Every day | ORAL | Status: DC
  Administered 2022-05-13: 25 mg via ORAL
  Filled 2022-05-13: qty 1

## 2022-05-13 MED ORDER — FAMOTIDINE 20 MG PO TABS: 20.0000 mg | ORAL_TABLET | Freq: Every day | ORAL | Status: DC

## 2022-05-13 MED ORDER — CLOPIDOGREL BISULFATE 75 MG PO TABS
75.0000 mg | ORAL_TABLET | Freq: Every day | ORAL | 0 refills | Status: DC
  Filled 2022-05-13: qty 21, 21d supply, fill #0

## 2022-05-13 MED ORDER — ATORVASTATIN CALCIUM 10 MG PO TABS
20.0000 mg | ORAL_TABLET | Freq: Every day | ORAL | Status: DC
  Administered 2022-05-13: 20 mg via ORAL
  Filled 2022-05-13: qty 2

## 2022-05-13 MED ORDER — LOSARTAN POTASSIUM 50 MG PO TABS
100.0000 mg | ORAL_TABLET | Freq: Every day | ORAL | Status: DC
  Administered 2022-05-13: 100 mg via ORAL
  Filled 2022-05-13: qty 2

## 2022-05-13 MED ORDER — ATORVASTATIN CALCIUM 20 MG PO TABS
20.0000 mg | ORAL_TABLET | Freq: Every day | ORAL | 1 refills | Status: DC
  Filled 2022-05-13: qty 30, 30d supply, fill #0
  Filled 2022-05-31: qty 30, 30d supply, fill #1

## 2022-05-13 MED ORDER — ASPIRIN 81 MG PO TBEC
81.0000 mg | DELAYED_RELEASE_TABLET | Freq: Every day | ORAL | 12 refills | Status: DC
  Filled 2022-05-13: qty 30, 30d supply, fill #0
  Filled 2022-05-31: qty 30, 30d supply, fill #1
  Filled 2022-06-01: qty 100, 100d supply, fill #1
  Filled 2022-08-18: qty 120, 120d supply, fill #2
  Filled 2023-01-08: qty 120, 120d supply, fill #3

## 2022-05-13 MED ORDER — FUROSEMIDE 40 MG PO TABS
40.0000 mg | ORAL_TABLET | Freq: Every day | ORAL | Status: DC
  Administered 2022-05-13: 40 mg via ORAL
  Filled 2022-05-13: qty 1

## 2022-05-13 MED ORDER — CLOPIDOGREL BISULFATE 75 MG PO TABS
75.0000 mg | ORAL_TABLET | Freq: Every day | ORAL | Status: DC
  Administered 2022-05-13: 75 mg via ORAL
  Filled 2022-05-13 (×2): qty 1

## 2022-05-13 MED ORDER — DAPAGLIFLOZIN PROPANEDIOL 10 MG PO TABS: 10.0000 mg | ORAL_TABLET | Freq: Every day | ORAL | Status: DC

## 2022-05-13 NOTE — Discharge Summary (Addendum)
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   Accu-Chek Softclix Lancets lancets Use as directed to test blood sugar once daily.   amLODipine 10 MG tablet Commonly known as: NORVASC Take 1 tablet (10 mg total) by mouth once daily.   aspirin EC 81 MG tablet Take 1 tablet (81 mg total) by mouth daily. Swallow whole.   atorvastatin 20 MG tablet Commonly known as: LIPITOR Take 1 tablet (20 mg total) by mouth daily. What changed:  medication strength how much to take   Blood Pressure Monitor 7 Devi Use to monitor blood pressure.   carvedilol 25 MG tablet Commonly known as: COREG TAKE 1 TABLET (25 MG TOTAL) BY MOUTH 2 (TWO) TIMES DAILY WITH A MEAL. What changed: how much to take   clopidogrel 75 MG tablet Commonly known as: PLAVIX Take 1 tablet (75 mg total) by mouth daily.   famotidine 20 MG tablet Commonly known as: PEPCID Take 1 tablet (20 mg total) by mouth daily.   Farxiga 10 MG Tabs tablet Generic drug: dapagliflozin propanediol Take 1 tablet (10 mg total) by mouth once daily before breakfast.   furosemide 40 MG tablet Commonly known as: LASIX TAKE 1 TABLET (40 MG TOTAL) BY MOUTH ONCE DAILY. What changed: how much to take   losartan 100 MG tablet Commonly known as: COZAAR Take 1 tablet (100 mg total) by mouth once daily.   metFORMIN 500  MG tablet Commonly known as: GLUCOPHAGE Take 1 tablet (500 mg total) by mouth 2 (two) times daily with a meal.   potassium chloride 8 MEQ tablet Commonly known as: KLOR-CON Take 2 tablets (16 mEq total) by mouth 2 (two) times daily.   spironolactone 25 MG tablet Commonly known as: Aldactone Take 1 tablet (25 mg total) by mouth once daily.   tadalafil (PAH) 20 MG tablet Commonly known as: ADCIRCA take 1 tablet by mouth once daily 1 hour prior to sex as needed. limit use to 1 tab per 24 hours. What changed:  how much to take how to take this when to take this reasons to take this        LABORATORY STUDIES CBC    Component Value Date/Time   WBC 5.0 05/11/2022 0849   RBC 4.93 05/11/2022 0849   HGB 13.3 05/11/2022 1119   HGB 13.2 02/23/2021 1125   HCT 39.0 05/11/2022 1119   HCT 39.9 02/23/2021 1125   PLT 136 (L) 05/11/2022 0849   PLT 143 (L) 02/23/2021 1125   MCV 80.7 05/11/2022 0849   MCV 78 (L) 02/23/2021 1125   MCH 26.8 05/11/2022 0849   MCHC 33.2 05/11/2022 0849   RDW 16.5 (H) 05/11/2022 0849   RDW 14.2 02/23/2021 1125   LYMPHSABS 2.6 05/11/2022 0849   LYMPHSABS 4.3 (  H) 02/23/2021 1125   MONOABS 0.6 05/11/2022 0849   EOSABS 0.1 05/11/2022 0849   EOSABS 0.2 02/23/2021 1125   BASOSABS 0.1 05/11/2022 0849   BASOSABS 0.1 02/23/2021 1125   CMP    Component Value Date/Time   NA 131 (L) 05/13/2022 0304   NA 140 01/04/2022 1542   K 4.0 05/13/2022 0304   CL 102 05/13/2022 0304   CO2 20 (L) 05/13/2022 0304   GLUCOSE 127 (H) 05/13/2022 0304   BUN 19 05/13/2022 0304   BUN 12 01/04/2022 1542   CREATININE 1.32 (H) 05/13/2022 0304   CALCIUM 10.4 (H) 05/13/2022 0304   CALCIUM 11.0 (H) 02/12/2018 1237   PROT 5.8 (L) 05/11/2022 0849   PROT 7.5 01/04/2022 1542   ALBUMIN 2.8 (L) 05/11/2022 0849   ALBUMIN 4.0 01/04/2022 1542   AST 17 05/11/2022 0849   ALT 14 05/11/2022 0849   ALKPHOS 28 (L) 05/11/2022 0849   BILITOT 0.6 05/11/2022 0849   BILITOT 0.3 01/04/2022 1542    GFRNONAA 58 (L) 05/13/2022 0304   GFRAA 53 (L) 03/04/2020 1231   COAGS Lab Results  Component Value Date   INR 1.2 05/11/2022   Lipid Panel    Component Value Date/Time   CHOL 112 05/12/2022 0338   CHOL 108 02/23/2021 1125   TRIG 93 05/12/2022 0338   HDL 37 (L) 05/12/2022 0338   HDL 36 (L) 02/23/2021 1125   CHOLHDL 3.0 05/12/2022 0338   VLDL 19 05/12/2022 0338   LDLCALC 56 05/12/2022 0338   LDLCALC 52 02/23/2021 1125   HgbA1C  Lab Results  Component Value Date   HGBA1C 6.4 (H) 01/04/2022   Urinalysis    Component Value Date/Time   COLORURINE STRAW (A) 05/11/2022 0945   APPEARANCEUR CLEAR 05/11/2022 0945   LABSPEC 1.007 05/11/2022 0945   PHURINE 6.0 05/11/2022 0945   GLUCOSEU >=500 (A) 05/11/2022 0945   HGBUR NEGATIVE 05/11/2022 0945   BILIRUBINUR NEGATIVE 05/11/2022 0945   KETONESUR NEGATIVE 05/11/2022 0945   PROTEINUR 30 (A) 05/11/2022 0945   NITRITE NEGATIVE 05/11/2022 0945   LEUKOCYTESUR NEGATIVE 05/11/2022 0945   Urine Drug Screen     Component Value Date/Time   LABOPIA NONE DETECTED 05/11/2022 0945   COCAINSCRNUR NONE DETECTED 05/11/2022 0945   LABBENZ NONE DETECTED 05/11/2022 0945   AMPHETMU NONE DETECTED 05/11/2022 0945   THCU NONE DETECTED 05/11/2022 0945   LABBARB NONE DETECTED 05/11/2022 0945    Alcohol Level    Component Value Date/Time   ETH <10 05/11/2022 0849     SIGNIFICANT DIAGNOSTIC STUDIES VAS [place] LOWER EXTREMITY VENOUS (DVT)  Result Date: 05/13/2022  Lower Venous DVT Study Patient Name:  Kato Signer.  Date of Exam:   05/13/2022 Medical Rec #: [id number]         Accession #:    [id number] Date of Birth: 22-Mar-1951         Patient Gender: M Patient Age:   72 years Exam Location:  [facility] Procedure:      VAS [place] LOWER EXTREMITY VENOUS (DVT) Referring Phys: Janine Ores --------------------------------------------------------------------------------  Indications: Stroke.  Limitations: Body habitus and poor ultrasound/tissue  interface. Comparison Study: No previous exams Performing Technologist: Jody Hill RVT, RDMS  Examination Guidelines: A complete evaluation includes B-mode imaging, spectral Doppler, color Doppler, and power Doppler as needed of all accessible portions of each vessel. Bilateral testing is considered an integral part of a complete examination. Limited examinations for reoccurring indications may be performed as noted. The reflux portion  of the exam is performed with the patient in reverse Trendelenburg.  +---------+---------------+---------+-----------+----------+-------------------+ RIGHT    CompressibilityPhasicitySpontaneityPropertiesThrombus Aging      +---------+---------------+---------+-----------+----------+-------------------+ CFV      Full           Yes      Yes                                      +---------+---------------+---------+-----------+----------+-------------------+ SFJ      Full                                                             +---------+---------------+---------+-----------+----------+-------------------+ FV Prox  Full           Yes      Yes                                      +---------+---------------+---------+-----------+----------+-------------------+ FV Mid   Full           Yes      Yes                                      +---------+---------------+---------+-----------+----------+-------------------+ FV DistalFull           Yes      Yes                                      +---------+---------------+---------+-----------+----------+-------------------+ PFV      Full                                                             +---------+---------------+---------+-----------+----------+-------------------+ POP      Full           Yes      Yes                                      +---------+---------------+---------+-----------+----------+-------------------+ PTV      Full                                                              +---------+---------------+---------+-----------+----------+-------------------+ PERO     Full                                         Not well visualized +---------+---------------+---------+-----------+----------+-------------------+   +---------+---------------+---------+-----------+----------+--------------+ LEFT     CompressibilityPhasicitySpontaneityPropertiesThrombus Aging +---------+---------------+---------+-----------+----------+--------------+ CFV      Full  Yes      Yes                                 +---------+---------------+---------+-----------+----------+--------------+ SFJ      Full                                                        +---------+---------------+---------+-----------+----------+--------------+ FV Prox  Full           Yes      Yes                                 +---------+---------------+---------+-----------+----------+--------------+ FV Mid   Full           Yes      Yes                                 +---------+---------------+---------+-----------+----------+--------------+ FV DistalFull           Yes      Yes                                 +---------+---------------+---------+-----------+----------+--------------+ PFV      Full                                                        +---------+---------------+---------+-----------+----------+--------------+ POP      Full           Yes      Yes                                 +---------+---------------+---------+-----------+----------+--------------+ PTV      Full                                                        +---------+---------------+---------+-----------+----------+--------------+ PERO                                                  Not visualized +---------+---------------+---------+-----------+----------+--------------+   Left Technical Findings: Not visualized segments include Peroneal veins.   Summary:  BILATERAL: - No evidence of deep vein thrombosis seen in the lower extremities, bilaterally. -No evidence of popliteal cyst, bilaterally.   *See table(s) above for measurements and observations.    Preliminary    MR BRAIN WO CONTRAST  Result Date: 05/13/2022 CLINICAL DATA:  Stroke, follow up. EXAM: MRI HEAD WITHOUT CONTRAST TECHNIQUE: Multiplanar, multiecho pulse sequences of the brain and surrounding structures were obtained without intravenous contrast. COMPARISON:  Head CT 05/12/2022 and MRI 05/11/2022 FINDINGS: At the request of the  inpatient stroke team, only axial and coronal diffusion weighted imaging was performed. There are a few subcentimeter acute bilateral parieto-occipital cortical infarcts which were not present on the recent prior MRI. Chronic small vessel ischemia is again noted including chronic lacunar infarcts in the bilateral basal ganglia and deep white matter, more fully evaluated on the recent complete MRI. No intracranial mass effect or extra-axial fluid collection is evident on this abbreviated examination. IMPRESSION: Small acute bilateral parieto-occipital cortical infarcts. Electronically Signed   By: Logan Bores M.D.   On: 05/13/2022 11:43   CT HEAD WO CONTRAST (5MM)  Result Date: 05/12/2022 CLINICAL DATA:  Stroke, follow-up. EXAM: CT HEAD WITHOUT CONTRAST TECHNIQUE: Contiguous axial images were obtained from the base of the skull through the vertex without intravenous contrast. RADIATION DOSE REDUCTION: This exam was performed according to the departmental dose-optimization program which includes automated exposure control, adjustment of the mA and/or kV according to patient size and/or use of iterative reconstruction technique. COMPARISON:  Head CT 05/11/2022.  MRI brain 05/11/2022. FINDINGS: Brain: No acute intracranial hemorrhage. Unchanged findings of chronic small-vessel disease with old lacunar infarcts in the bilateral basal ganglia, right external capsule/corona radiata  and right internal capsule. Gray-white differentiation is otherwise preserved. No hydrocephalus or extra-axial collection. Vascular: No hyperdense vessel or unexpected calcification. Skull: No calvarial fracture or suspicious bone lesion. Skull base is unremarkable. Sinuses/Orbits: Unremarkable. Other: None. IMPRESSION: 1. No acute intracranial abnormality. 2. Unchanged findings of chronic small-vessel disease. Electronically Signed   By: Emmit Alexanders M.D.   On: 05/12/2022 08:53   ECHOCARDIOGRAM COMPLETE  Result Date: 05/11/2022    ECHOCARDIOGRAM REPORT   Patient Name:   Adhrith Wierman. Date of Exam: 05/11/2022 Medical Rec #:  [id number]        Height:       73.0 in Accession #:    [id number]       Weight:       360.0 lb Date of Birth:  [birth year]        BSA:          2.762 m Patient Age:    89 years         BP:           195/104 mmHg Patient Gender: M                HR:           60 bpm. Exam Location:  Inpatient Procedure: 2D Echo, Cardiac Doppler and Color Doppler Indications:    Stroke  History:        Patient has no prior history of Echocardiogram examinations.                 Abnormal ECG, Stroke, Arrythmias:AV block,                 Signs/Symptoms:Dizziness/Lightheadedness; Risk                 Factors:Hypertension and Diabetes. ETOH.  Sonographer:    Roseanna Rainbow RDCS Referring Phys: Amie Portland  Sonographer Comments: Technically difficult study due to poor echo windows and patient is obese. Image acquisition challenging due to patient body habitus. IMPRESSIONS  1. Left ventricular ejection fraction, by estimation, is 55 to 60%. The left ventricle has normal function. The left ventricle has no regional wall motion abnormalities. There is moderate left ventricular hypertrophy. Left ventricular diastolic parameters are consistent with Grade I diastolic dysfunction (impaired relaxation).  2. Right ventricular systolic function is normal.  The right ventricular size is normal.  3. The mitral valve is normal in  structure. Mild mitral valve regurgitation. No evidence of mitral stenosis.  4. The aortic valve has an indeterminant number of cusps. Aortic valve regurgitation is moderate. Mild aortic valve stenosis.  5. Aortic dilatation noted. There is borderline dilatation of the aortic root, measuring 39 mm.  6. The inferior vena cava is normal in size with greater than 50% respiratory variability, suggesting right atrial pressure of 3 mmHg. Comparison(s): No prior Echocardiogram. FINDINGS  Left Ventricle: Left ventricular ejection fraction, by estimation, is 55 to 60%. The left ventricle has normal function. The left ventricle has no regional wall motion abnormalities. The left ventricular internal cavity size was normal in size. There is  moderate left ventricular hypertrophy. Left ventricular diastolic parameters are consistent with Grade I diastolic dysfunction (impaired relaxation). Right Ventricle: The right ventricular size is normal. Right ventricular systolic function is normal. Left Atrium: Left atrial size was normal in size. Right Atrium: Right atrial size was normal in size. Pericardium: Trivial pericardial effusion is present. Mitral Valve: The mitral valve is normal in structure. Mild mitral valve regurgitation. No evidence of mitral valve stenosis. Tricuspid Valve: The tricuspid valve is normal in structure. Tricuspid valve regurgitation is trivial. No evidence of tricuspid stenosis. Aortic Valve: The aortic valve has an indeterminant number of cusps. Aortic valve regurgitation is moderate. Aortic regurgitation PHT measures 709 msec. Mild aortic stenosis is present. Aortic valve mean gradient measures 16.0 mmHg. Aortic valve peak gradient measures 30.6 mmHg. Aortic valve area, by VTI measures 2.89 cm. Pulmonic Valve: The pulmonic valve was not well visualized. Pulmonic valve regurgitation is not visualized. No evidence of pulmonic stenosis. Aorta: Aortic dilatation noted. There is borderline dilatation of the  aortic root, measuring 39 mm. Venous: The inferior vena cava is normal in size with greater than 50% respiratory variability, suggesting right atrial pressure of 3 mmHg. IAS/Shunts: No atrial level shunt detected by color flow Doppler.  LEFT VENTRICLE PLAX 2D LVIDd:         5.10 cm      Diastology LVIDs:         3.30 cm      LV e' medial:    5.98 cm/s LV PW:         1.40 cm      LV E/e' medial:  12.5 LV IVS:        1.80 cm      LV e' lateral:   6.96 cm/s LVOT diam:     2.80 cm      LV E/e' lateral: 10.7 LV SV:         166 LV SV Index:   60 LVOT Area:     6.16 cm  LV Volumes (MOD) LV vol d, MOD A2C: 229.0 ml LV vol d, MOD A4C: 176.0 ml LV vol s, MOD A2C: 71.5 ml LV vol s, MOD A4C: 72.7 ml LV SV MOD A2C:     157.5 ml LV SV MOD A4C:     176.0 ml LV SV MOD BP:      140.8 ml RIGHT VENTRICLE             IVC RV S prime:     16.90 cm/s  IVC diam: 2.10 cm TAPSE (M-mode): 1.9 cm LEFT ATRIUM             Index        RIGHT ATRIUM  Index LA diam:        3.60 cm 1.30 cm/m   RA Area:     12.50 cm LA Vol (A2C):   78.0 ml 28.24 ml/m  RA Volume:   25.50 ml  9.23 ml/m LA Vol (A4C):   60.7 ml 21.98 ml/m LA Biplane Vol: 69.9 ml 25.31 ml/m  AORTIC VALVE AV Area (Vmax):    2.83 cm AV Area (Vmean):   2.74 cm AV Area (VTI):     2.89 cm AV Vmax:           276.67 cm/s AV Vmean:          182.667 cm/s AV VTI:            0.573 m AV Peak Grad:      30.6 mmHg AV Mean Grad:      16.0 mmHg LVOT Vmax:         127.00 cm/s LVOT Vmean:        81.400 cm/s LVOT VTI:          0.269 m LVOT/AV VTI ratio: 0.47 AI PHT:            709 msec  AORTA Ao Root diam: 3.90 cm Ao Asc diam:  3.80 cm MITRAL VALVE MV Area (PHT): 2.80 cm     SHUNTS MV Decel Time: 271 msec     Systemic VTI:  0.27 m MV E velocity: 74.80 cm/s   Systemic Diam: 2.80 cm MV A velocity: 101.15 cm/s MV E/A ratio:  0.74 Kirk Ruths MD Electronically signed by Kirk Ruths MD Signature Date/Time: 05/11/2022/1:48:03 PM    Final    EEG adult  Result Date: 05/11/2022 Lora Havens, MD     05/11/2022 12:16 PM Patient Name: [name]. MRN: [id number] Epilepsy Attending: Lora Havens Referring Physician/Provider: August Albino, NP Date: 05/11/2022 Duration: 23.38 mins Patient history: 72 year old male who initially presented with right-sided numbness, tingling and dizziness. He suddenly became globally aphasic, had worsening right-sided weakness, right-sided neglect and left gaze preference.  EEG to evaluate for seizure. Level of alertness: Awake,asleep AEDs during EEG study: None Technical aspects: This EEG study was done with scalp electrodes positioned according to the 10-20 International system of electrode placement. Electrical activity was reviewed with band pass filter of 1-70Hz$ , sensitivity of 7 uV/mm, display speed of 33m/sec with a 60Hz$  notched filter applied as appropriate. EEG data were recorded continuously and digitally stored.  Video monitoring was available and reviewed as appropriate. Description: The posterior dominant rhythm consists of 8 Hz activity of moderate voltage (25-35 uV) seen predominantly in posterior head regions, symmetric and reactive to eye opening and eye closing. Sleep was characterized by vertex waves, sleep spindles (12 to 14 Hz), maximal frontocentral region.  EEG showed continuous generalized 3 to 7 Hz theta-delta slowing. Hyperventilation and photic stimulation were not performed.   ABNORMALITY - Continuous slow, generalized IMPRESSION: This study is suggestive of moderate diffuse encephalopathy, nonspecific etiology. No seizures or epileptiform discharges were seen throughout the recording. PLora Havens  MR BRAIN WO CONTRAST  Result Date: 05/11/2022 CLINICAL DATA:  72year old male code stroke presentation status post TNK for right side deficits. EXAM: MRI HEAD WITHOUT CONTRAST TECHNIQUE: Multiplanar, multiecho pulse sequences of the brain and surrounding structures were obtained without intravenous contrast. COMPARISON:  CTA  head and neck, head CTs today. FINDINGS: Brain: Study is mildly degraded by motion artifact despite repeated imaging attempts. No convincing diffusion restriction. Chronic lacunar infarcts  of the right deep gray nuclei. Mild midbrain Wallerian degeneration. Mild contralateral left deep gray nuclei chronic small vessel disease. Scattered mild additional bilateral white matter T2 and FLAIR hyperintensity. No cortical encephalomalacia or chronic cerebral blood products identified. No midline shift, mass effect, evidence of mass lesion, ventriculomegaly, extra-axial collection or acute intracranial hemorrhage. Cervicomedullary junction and pituitary are within normal limits. Vascular: Major intracranial vascular flow voids are preserved. Skull and upper cervical spine: Negative. Sinuses/Orbits: Coronal T2 weighted imaging not provided. Orbit motion artifact. Paranasal sinuses and mastoids are stable and well aerated. Other: Grossly negative visible internal auditory structures. IMPRESSION: 1. Intermittently motion degraded exam. No acute intracranial abnormality identified. 2. 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 Electronically Signed   By: Genevie Ann M.D.   On: 05/11/2022 10:42   CT HEAD WO CONTRAST (5MM)  Result Date: 05/11/2022 CLINICAL DATA:  Hypertension EXAM: CT HEAD WITHOUT CONTRAST TECHNIQUE: Contiguous axial images were obtained from the base of the skull through the vertex without intravenous contrast. RADIATION DOSE REDUCTION: This exam was performed according to the departmental dose-optimization program which includes automated exposure control, adjustment of the mA and/or kV according to patient size and/or use of iterative reconstruction technique. COMPARISON:  Same day CT at 0902 hours FINDINGS: Brain: No evidence of acute infarction, hemorrhage, hydrocephalus, extra-axial collection or mass lesion/mass effect. Moderate  low-density changes within the periventricular and subcortical white matter compatible with chronic microvascular ischemic change. Mild diffuse cerebral volume loss. Vascular: Atherosclerotic calcifications involving the large vessels of the skull base. No unexpected hyperdense vessel. Skull: Normal. Negative for fracture or focal lesion. Sinuses/Orbits: No acute finding. Other: None. IMPRESSION: No acute intracranial abnormality. No interval change from the recently performed study. Electronically Signed   By: Davina Poke D.O.   On: 05/11/2022 10:01   CT ANGIO HEAD NECK W WO CM (CODE STROKE)  Result Date: 05/11/2022 CLINICAL DATA:  Code stroke. 72 year old male right side deficit. Status post TNK. EXAM: CT ANGIOGRAPHY HEAD AND NECK TECHNIQUE: Multidetector CT imaging of the head and neck was performed using the standard protocol during bolus administration of intravenous contrast. Multiplanar CT image reconstructions and MIPs were obtained to evaluate the vascular anatomy. Carotid stenosis measurements (when applicable) are obtained utilizing NASCET criteria, using the distal internal carotid diameter as the denominator. RADIATION DOSE REDUCTION: This exam was performed according to the departmental dose-optimization program which includes automated exposure control, adjustment of the mA and/or kV according to patient size and/or use of iterative reconstruction technique. CONTRAST:  9m OMNIPAQUE IOHEXOL 350 MG/ML SOLN COMPARISON:  Plain head CT this morning. FINDINGS: CTA NECK Skeleton: Widespread cervical spine degeneration, bulky disc and endplate disease. No acute osseous abnormality identified. Upper chest: Negative lung apices. Visible superior mediastinal lymph nodes are within normal limits. Other neck: Retropharyngeal course of both carotid arteries in the neck. Aortic arch: Calcified aortic atherosclerosis. Bovine arch configuration. Right carotid system: No brachiocephalic artery plaque or  stenosis. Tortuous right CCA origin without stenosis. Retropharyngeal right CCA and right carotid bifurcation with only mild right ICA plaque and no stenosis. Left carotid system: Bovine left CCA origin with no plaque or stenosis. Similar tortuosity and retropharyngeal course with only mild calcified plaque at the bifurcation more affecting the ECA, no stenosis. Vertebral arteries: Calcified plaque and tortuosity of the right subclavian artery origin without significant stenosis. Right vertebral artery origin is patent without significant stenosis. Right vertebral artery is patent to the skull base  with no significant plaque or stenosis. Proximal left subclavian artery and left vertebral artery origin are patent with no significant plaque or stenosis. Tortuous left V1 segment. Codominant left vertebral arteries patent to the skull base with no significant stenosis. CTA HEAD Posterior circulation: Codominant V4 segments and vertebrobasilar junction are patent with no significant plaque or stenosis. Bilateral AICA appear dominant, patent. Basilar artery is patent although with long segment mild proximal and mid basilar stenosis, series 10, image 32. Mild fusiform enlargement of the basilar tip. Basilar tip, SCA and left PCA origins are patent. Fetal type right PCA origin with atherosclerotic and stenotic right posterior communicating artery. Furthermore, the right PCA P2 segment is irregular and stenotic. See series 8, image 19. Distal right PCA branches remain patent. Contralateral up to moderate left PCA origin irregularity and stenosis. Superimposed moderate distal left P2 segment stenosis, and mild-to-moderate P3 superior division stenosis. Anterior circulation: Both ICA siphons are patent. Up to moderate calcified left siphon plaque but no significant left siphon stenosis. Similar moderate right siphon plaque, but only mild supraclinoid right siphon stenosis. Patent carotid termini, MCA and ACA origins. Mild to  moderate bilateral A1 segment irregularity and stenosis slightly greater on the right (series 10, image 30). Diminutive or absent anterior communicating artery. There is moderate to severe left ACA A2 segment stenosis on series 12, image 23 but the distal ACA branches appear to remain patent. Up to moderate distal right ACA branch irregularity and stenosis on series 12, image 23 also. Left MCA M1 segment is mildly irregular but patent to the bifurcation without significant stenosis. No left MCA branch occlusion is identified. Mild left MCA branch irregularity. Contralateral right MCA M1 is mildly tortuous and irregular without stenosis. Patent right MCA bifurcation. Mild M2 but moderate posterior right M3 or M4 branch irregularity and stenosis on series 12, image 15. Venous sinuses: Patent. Anatomic variants: Bovine arch configuration. Fetal type right PCA origin. Review of the MIP images confirms the above findings IMPRESSION: 1. Negative for large vessel occlusion. But Positive for advanced and widespread intracranial atherosclerosis: - highly stenotic Right fetal PCA. - moderate tandem stenoses of the contralateral Left PCA. - mild bilateral ACA A1 stenosis, and moderate to severe stenosis Left ACA A2 and right A3. - up to moderate Right MCA M3/4 branch stenoses. - mild stenoses of the Basilar artery, ICA siphons. 2.  Aortic Atherosclerosis (ICD10-I70.0). Salient findings Study discussed by telephone with Dr. Rory Percy on 05/11/2022 at 09:35. At 0924 hours Electronically Signed   By: Genevie Ann M.D.   On: 05/11/2022 09:37   CT HEAD CODE STROKE WO CONTRAST  Result Date: 05/11/2022 CLINICAL DATA:  Code stroke.  72 year old male right side deficit. EXAM: CT HEAD WITHOUT CONTRAST TECHNIQUE: Contiguous axial images were obtained from the base of the skull through the vertex without intravenous contrast. RADIATION DOSE REDUCTION: This exam was performed according to the departmental dose-optimization program which includes  automated exposure control, adjustment of the mA and/or kV according to patient size and/or use of iterative reconstruction technique. COMPARISON:  Head CT 12/30/2011. FINDINGS: Brain: Cerebral volume loss since 2013 along with substantially progressed but chronic appearing small vessel ischemia in the right basal ganglia and internal capsule. Chronic appearing cystic encephalomalacia there. Asymmetric additional patchy cerebral white matter hypodensity has increased but is age indeterminate. No superimposed acute cortically based infarct identified. Vascular: Calcified atherosclerosis at the skull base. Skull: Negative. Sinuses/Orbits: Visualized paranasal sinuses and mastoids are stable and well aerated. Other: Orbits and scalp  appears stable and essentially negative, there is a tiny midline forehead benign scalp lipoma. ASPECTS [facility] Stroke Program Early CT Score) Total score (0-10 with 10 being normal): 10 IMPRESSION: 1. No acute cortically based infarct or acute intracranial hemorrhage identified. ASPECTS 10. 2. But substantially new/progressed small vessel disease since 2013, especially in the right basal ganglia and internal capsule. 3. These results were communicated to Dr. Rory Percy at 9:12 am on 05/11/2022 by text page via the [facility] messaging system. Electronically Signed   By: Genevie Ann M.D.   On: 05/11/2022 09:13      HISTORY OF PRESENT ILLNESS Mr. Reyden Balderston. is a 72 y.o. male with history of obesity, HTN, DM who presents to the [facility] ED via EMS for evaluation of right side numbness and tingling and dizziness. At [place], patient had a sudden neurological change when he suddenly became globally aphasic, right side weakness, right side neglect and left gaze preference. Patient was taken back to CT for Stat CT head with no acute process. Patient sent to MRI for stat MRI brain which did not reveal an acute process.  Follow up MRI shows small acute bilarteral parieto-occipital.   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 ASPECTS 10.    CTA head & neck - advanced and widespread intracranial atherosclerosis: highly stenotic Right fetal PCA. moderate tandem stenoses of the contralateral Left PCA. mild bilateral ACA A1 stenosis, and moderate to severe stenosis. Left ACA A2 and right A3. up to moderate Right MCA M3/4 branch stenoses. mild stenoses of the Basilar artery, ICA siphons. MRI 2/8 No acute intracranial abnormality identified. 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 Repeat Head CT- No acute intracranial abnormality MRI 2/10 - Small acute bilateral parieto-occipital cortical infarcts.  Recommend outpt cardiology EP follow up for loop recorder placement 2D Echo- EF 55-60% LE venous doppler no DVT EEG- This study is suggestive of moderate diffuse encephalopathy, nonspecific etiology. No seizures LDL 56 HgbA1c 6.4 UDS neg VTE prophylaxis - heparin No antithrombotic prior to admission, now on aspirin 81 mg and plavix 75 DAPT daily for 3 weeks and then ASA alone. Therapy recommendations:  none Disposition:  home with family    CHF Reserved EF 55-60% now On home coreg, lasix, spironolactone, cozaar and faxiga  Followed up in [place] in the past Will request follow up with cardiology for CHF and loop recorder placement  Hypertension Home meds:  norvasc, coreg, losartan, lasix, spironolactone,  Stable Long-term BP goal normotensive   Hyperlipidemia Home meds:  lipitor 4m, resumed in hospital LDL 56, goal < 70 Increase lipitor to 20 No high intensity given LDL at goal Continue statin at discharge   Other Stroke Risk Factors Advanced Age >/= 659 Obesity, Body mass index is 47.5 kg/m., BMI >/= 30 associated with increased stroke risk, recommend weight loss, diet and exercise as appropriate     DISCHARGE EXAM Blood pressure (!) 142/93, pulse (!) 59, temperature 97.6 F (36.4 C), temperature source Oral, resp. rate 18, height 6' 1"$  (1.854 m), weight (!) 156.3 kg, SpO2 98 %. Neuro: Mental Status: Patient is awake, alert, oriented to person, place, month, year, and situation. Patient is able to give a clear and coherent history. No signs of aphasia or neglect Cranial Nerves: II: Visual Fields are full. Pupils are equal, round, and reactive to light.   III,IV, VI:  EOMI without ptosis or diploplia.  V: Facial sensation is symmetric to temperature VII: Slight left facial droop VIII: Hearing is intact to voice X: Palate elevates symmetrically XI: Shoulder shrug is symmetric. XII: Tongue protrudes midline without atrophy or fasciculations.  Motor: Tone is normal. Bulk is normal. 5/5 strength was present in all four extremities.  Sensory: Sensation is symmetric to light touch in the arms and legs.  Cerebellar: FNF and HKS are intact bilaterally  Discharge Diet       Diet   Diet Carb Modified Fluid consistency: Thin; Room service appropriate? Yes   liquids  DISCHARGE PLAN Disposition:  Home aspirin 81 mg daily and clopidogrel 75 mg daily for secondary stroke prevention for 3 weeks then ASA 15m alone. Ongoing stroke risk factor control by Primary Care Physician at time of discharge Outpatient cardiology follow up in 2 weeks  Follow-up PCP WElsie Stain MD in 2 weeks. [facility], office to schedule an appointment.   35 minutes were spent preparing discharge.  Patient seen and examined by NP/APP with MD. MD to update note as needed.   DJanine Ores DNP, FNP-BC [facility] Pager: [phone] ATTENDING NOTE: I reviewed above note and agree with the assessment and plan. Pt was seen and examined.   72year old male with history of hypertension, diabetes, CHF, obesity admitted for  right-sided numbness tingling and dizziness.  Symptoms worsened to aphasia, right-sided weakness, left gaze and right neglect.  CT no acute abnormality.  Status post TNK.  MRI done soon after TNK showed no acute abnormality.  CTA head neck showed multifocal atherosclerosis including right fetal PCA severe stenosis, left PCA tandem stenosis, left A2 and right A3 severe stenosis, right M3/M4 moderate stenosis.  CT repeat no acute abnormality.  MRI repeat showed bilateral parietal occipital small scattered infarcts.  EF 55 to 60%.  EEG no seizure.  LDL 56, A1c 6.4, UDS negative.  Etiology for patient stroke likely cardioembolic given bilateral involvement and risk factors.  Recommend outpatient follow-up with EP to consider loop recorder placement.  Patient also has history of CHF on GDMT at home, recommend outpatient follow-up with cardiology.  Currently on aspirin 81 and Plavix 75 DAPT for 3 weeks and then aspirin alone.  Increase Lipitor from 10-20.  Aggressive risk factor modification.  PT/OT no recommendation.  Patient will be discussed charged in good condition with neurology, cardiology follow-up.  For detailed assessment and plan, please refer to above/below as I have made changes wherever appropriate.   JRosalin Hawking MD PhD Stroke Neurology 05/13/2022 1:07 PM

## 2022-05-13 NOTE — Progress Notes (Signed)
BLE venous duplex has been completed.   Results can be found under chart review under CV PROC. 05/13/2022 11:53 AM Ilani Otterson RVT, RDMS

## 2022-05-13 NOTE — Progress Notes (Signed)
PT Cancellation Note  Patient Details Name: Austin Mercer. MRN: [id number] DOB: 11-14-1950   Cancelled Treatment:    Reason Eval/Treat Not Completed: Other (comment) (Pt Dcing home soon) Attempted to see pt several times today.  He was off the floor for testing easlier and now he was getting dressed for DC.  He did not want to participate with PT at this time and did not have any questions for me.      Melvern Banker 05/13/2022, 1:39 PM Elta Guadeloupe, PT   Acute Rehabilitation Services  Office [phone] 05/13/2022

## 2022-05-13 NOTE — Progress Notes (Signed)
Occupational Therapy Treatment Patient Details Name: Austin Mercer. MRN: [id number] DOB: [birth year] Today's Date: 05/13/2022   History of present illness 72 y.o. male who presented to ED 2/8 with dizziness and R side numbness/tingling.  Initial CT negative. TNK was administered @ 0909. At [place], pt became globally aphasic, with right side weakness, right side neglect and left gaze. Stat CT negative. Stat MRI negative. Repeat CT 2/9 negative. PMH: obesity, HTN, DM   OT comments  Pt. Seen for skilled OT treatment session.  Administered pill box test for cog. Eval.  Pt. Did not pass the test secondary to greater than 3 errors of filling the pill box for the week.  Pt. States his pharmacy fills his prescriptions for him in his pill box for the week. but that he could if he had too.  When reviewing errors with him and recommendations for a more uniform way of filling in the pill box Pt. Denied the errors and stated various explanations for why he had done what he did.  Jokes a lot and is in good spirits making it difficult to determine baseline or if any other issues presenting with his cognition.  Observed with in room ambulation to/from b.room and standing grooming task.  No sequencing issues noted even with tv on and conversation pt. Completed those tasks without error.     Recommendations for follow up therapy are one component of a multi-disciplinary discharge planning process, led by the attending physician.  Recommendations may be updated based on patient status, additional functional criteria and insurance authorization.    Follow Up Recommendations  No OT follow up     Assistance Recommended at Discharge PRN  Patient can return home with the following  Assist for transportation   Equipment Recommendations  None recommended by OT    Recommendations for Other Services      Precautions / Restrictions Precautions Precautions: Fall       Mobility Bed Mobility                     Transfers                         Balance                                           ADL either performed or assessed with clinical judgement   ADL Overall ADL's : Needs assistance/impaired                                       General ADL Comments: likes to joke around a lot.  minor re direction required to stay on task. fail on pill box but had stated he does not manage meds prior to this hospitialization but utilized the test to determine if he could if he needed to and observe other aspects of following directions.  main fail was he would begin going down the line from am to pm just focusing on one day at a time but then at the bedtime slot would start filling in for the entire week for bedtime.  reviewed that either one day at a time or each day for each bottle as it is easier to over fill or under fill doing it inconsistently the way  he was.  reviewed taking one bottle at a time and filling in each day with that bottle then moving to the next bottle would be ideal to omit errors.  he agreed    [employer] Assessment              Vision       Perception     Praxis      Cognition Arousal/Alertness: Awake/alert Behavior During Therapy: WFL for tasks assessed/performed Overall Cognitive Status: History of cognitive impairments - at baseline                                 General Comments: administered pill box test, pt. reports he uses the [facility] pharmacy and they fill his pill box for him prior to pick up.  had him try the test anyway to double check.  pt. asking several questions initially about the colors. encouragement to just read what it is asking for.  pt. had greater than 3 errors and received a fail score.  difficult to determine final cog. status as he states he does not do his pill management anyway.        Exercises      Shoulder Instructions       General Comments      Pertinent Vitals/  Pain       Pain Assessment Pain Assessment: No/denies pain  Home Living                                          Prior Functioning/Environment              Frequency           Progress Toward Goals  OT Goals(current goals can now be found in the care plan section)  Progress towards OT goals: Progressing toward goals     Plan Discharge plan remains appropriate    Co-evaluation                 AM-PAC OT "6 Clicks" Daily Activity     Outcome Measure   Help from another person eating meals?: None Help from another person taking care of personal grooming?: None Help from another person toileting, which includes using toliet, bedpan, or urinal?: A Little Help from another person bathing (including washing, rinsing, drying)?: A Little Help from another person to put on and taking off regular upper body clothing?: A Little Help from another person to put on and taking off regular lower body clothing?: A Little 6 Click Score: 20    End of Session    OT Visit Diagnosis: Unsteadiness on feet (R26.81)   Activity Tolerance Patient tolerated treatment well   Patient Left in chair;with call bell/phone within reach;with chair alarm set   Nurse Communication Other (comment) (rn came to give am meds but states he will come back after my session was complete)        Time: [id number] OT Time Calculation (min): 45 min  Charges: OT General Charges $OT Visit: 1 Visit  Candace Gallus Acute Rehabilitation [phone]   Clearnce Sorrel Lorraine-COTA/L 05/13/2022, 9:31 AM

## 2022-05-14 ENCOUNTER — Other Ambulatory Visit: Payer: Self-pay

## 2022-05-15 ENCOUNTER — Other Ambulatory Visit: Payer: Self-pay

## 2022-05-15 ENCOUNTER — Telehealth: Payer: Self-pay

## 2022-05-15 NOTE — Transitions of Care (Post Inpatient/ED Visit) (Signed)
   05/15/2022  Name: Austin Mercer. MRN: [id number] DOB: 09/17/1950  Today's TOC FU Call Status: Today's TOC FU Call Status:: Unsuccessul Call (1st Attempt) Unsuccessful Call (1st Attempt) Date: 05/15/22  Attempted to reach the patient regarding the most recent Inpatient/ED visit.  Follow Up Plan: Additional outreach attempts will be made to reach the patient to complete the Transitions of Care (Post Inpatient/ED visit) call.   Enzo Montgomery, RN,BSN,CCM [facility] Telephonic Care Management Coordinator Direct Phone: [phone] Toll Free: [phone] Fax: [phone]

## 2022-05-16 ENCOUNTER — Telehealth: Payer: Self-pay

## 2022-05-16 ENCOUNTER — Encounter (HOSPITAL_COMMUNITY): Payer: Self-pay

## 2022-05-16 ENCOUNTER — Inpatient Hospital Stay (HOSPITAL_COMMUNITY)
Admission: EM | Admit: 2022-05-16 | Discharge: 2022-05-22 | DRG: 064 | Disposition: A | Discharge status: Skilled Nursing Facility | Payer: Medicare PPO | Attending: Internal Medicine | Admitting: Internal Medicine

## 2022-05-16 ENCOUNTER — Emergency Department (HOSPITAL_COMMUNITY): Payer: Medicare PPO

## 2022-05-16 DIAGNOSIS — Z8619 Personal history of other infectious and parasitic diseases: Secondary | ICD-10-CM | POA: Diagnosis not present

## 2022-05-16 DIAGNOSIS — Z87891 Personal history of nicotine dependence: Secondary | ICD-10-CM

## 2022-05-16 DIAGNOSIS — Y92012 Bathroom of single-family (private) house as the place of occurrence of the external cause: Secondary | ICD-10-CM

## 2022-05-16 DIAGNOSIS — I63519 Cerebral infarction due to unspecified occlusion or stenosis of unspecified middle cerebral artery: Secondary | ICD-10-CM

## 2022-05-16 DIAGNOSIS — G8314 Monoplegia of lower limb affecting left nondominant side: Secondary | ICD-10-CM | POA: Diagnosis present

## 2022-05-16 DIAGNOSIS — Z8673 Personal history of transient ischemic attack (TIA), and cerebral infarction without residual deficits: Secondary | ICD-10-CM | POA: Diagnosis present

## 2022-05-16 DIAGNOSIS — Z885 Allergy status to narcotic agent status: Secondary | ICD-10-CM

## 2022-05-16 DIAGNOSIS — W19XXXA Unspecified fall, initial encounter: Secondary | ICD-10-CM | POA: Diagnosis present

## 2022-05-16 DIAGNOSIS — E119 Type 2 diabetes mellitus without complications: Secondary | ICD-10-CM

## 2022-05-16 DIAGNOSIS — E785 Hyperlipidemia, unspecified: Secondary | ICD-10-CM | POA: Diagnosis present

## 2022-05-16 DIAGNOSIS — R262 Difficulty in walking, not elsewhere classified: Secondary | ICD-10-CM | POA: Diagnosis present

## 2022-05-16 DIAGNOSIS — I639 Cerebral infarction, unspecified: Secondary | ICD-10-CM | POA: Diagnosis present

## 2022-05-16 DIAGNOSIS — G8311 Monoplegia of lower limb affecting right dominant side: Secondary | ICD-10-CM | POA: Diagnosis present

## 2022-05-16 DIAGNOSIS — Z87898 Personal history of other specified conditions: Secondary | ICD-10-CM | POA: Diagnosis not present

## 2022-05-16 DIAGNOSIS — K7460 Unspecified cirrhosis of liver: Secondary | ICD-10-CM | POA: Diagnosis present

## 2022-05-16 DIAGNOSIS — I6349 Cerebral infarction due to embolism of other cerebral artery: Secondary | ICD-10-CM | POA: Diagnosis present

## 2022-05-16 DIAGNOSIS — G8929 Other chronic pain: Secondary | ICD-10-CM | POA: Diagnosis present

## 2022-05-16 DIAGNOSIS — Z6841 Body Mass Index (BMI) 40.0 and over, adult: Secondary | ICD-10-CM

## 2022-05-16 DIAGNOSIS — F1010 Alcohol abuse, uncomplicated: Secondary | ICD-10-CM | POA: Diagnosis present

## 2022-05-16 DIAGNOSIS — K7030 Alcoholic cirrhosis of liver without ascites: Secondary | ICD-10-CM | POA: Diagnosis not present

## 2022-05-16 DIAGNOSIS — E1122 Type 2 diabetes mellitus with diabetic chronic kidney disease: Secondary | ICD-10-CM | POA: Diagnosis present

## 2022-05-16 DIAGNOSIS — I130 Hypertensive heart and chronic kidney disease with heart failure and stage 1 through stage 4 chronic kidney disease, or unspecified chronic kidney disease: Secondary | ICD-10-CM | POA: Diagnosis present

## 2022-05-16 DIAGNOSIS — Z91040 Latex allergy status: Secondary | ICD-10-CM

## 2022-05-16 DIAGNOSIS — Z9889 Other specified postprocedural states: Secondary | ICD-10-CM

## 2022-05-16 DIAGNOSIS — I5022 Chronic systolic (congestive) heart failure: Secondary | ICD-10-CM | POA: Diagnosis present

## 2022-05-16 DIAGNOSIS — R29701 NIHSS score 1: Secondary | ICD-10-CM | POA: Diagnosis present

## 2022-05-16 DIAGNOSIS — I10 Essential (primary) hypertension: Secondary | ICD-10-CM | POA: Diagnosis not present

## 2022-05-16 DIAGNOSIS — I700 Atherosclerosis of aorta: Secondary | ICD-10-CM | POA: Diagnosis not present

## 2022-05-16 DIAGNOSIS — G9349 Other encephalopathy: Secondary | ICD-10-CM | POA: Diagnosis present

## 2022-05-16 DIAGNOSIS — N1831 Chronic kidney disease, stage 3a: Secondary | ICD-10-CM | POA: Diagnosis present

## 2022-05-16 DIAGNOSIS — R531 Weakness: Secondary | ICD-10-CM | POA: Diagnosis not present

## 2022-05-16 DIAGNOSIS — K5900 Constipation, unspecified: Secondary | ICD-10-CM | POA: Diagnosis present

## 2022-05-16 DIAGNOSIS — R29898 Other symptoms and signs involving the musculoskeletal system: Principal | ICD-10-CM

## 2022-05-16 DIAGNOSIS — Z833 Family history of diabetes mellitus: Secondary | ICD-10-CM

## 2022-05-16 DIAGNOSIS — I3139 Other pericardial effusion (noninflammatory): Secondary | ICD-10-CM | POA: Diagnosis not present

## 2022-05-16 DIAGNOSIS — E66813 Obesity, class 3: Secondary | ICD-10-CM

## 2022-05-16 DIAGNOSIS — I6329 Cerebral infarction due to unspecified occlusion or stenosis of other precerebral arteries: Secondary | ICD-10-CM | POA: Diagnosis present

## 2022-05-16 HISTORY — DX: Cerebral infarction, unspecified: I63.9

## 2022-05-16 LAB — MAGNESIUM: Magnesium: 1.9 mg/dL (ref 1.7–2.4)

## 2022-05-16 LAB — COMPREHENSIVE METABOLIC PANEL
ALT: 22 U/L (ref 0–44)
AST: 25 U/L (ref 15–41)
Albumin: 3.9 g/dL (ref 3.5–5.0)
Alkaline Phosphatase: 50 U/L (ref 38–126)
Anion gap: 8 (ref 5–15)
BUN: 19 mg/dL (ref 8–23)
CO2: 21 mmol/L — ABNORMAL LOW (ref 22–32)
Calcium: 10.6 mg/dL — ABNORMAL HIGH (ref 8.9–10.3)
Chloride: 104 mmol/L (ref 98–111)
Creatinine, Ser: 1.64 mg/dL — ABNORMAL HIGH (ref 0.61–1.24)
GFR, Estimated: 45 mL/min — ABNORMAL LOW (ref >60)
Glucose, Bld: 144 mg/dL — ABNORMAL HIGH (ref 70–99)
Potassium: 4.7 mmol/L (ref 3.5–5.1)
Sodium: 133 mmol/L — ABNORMAL LOW (ref 135–145)
Total Bilirubin: 0.3 mg/dL (ref 0.3–1.2)
Total Protein: 7.6 g/dL (ref 6.5–8.1)

## 2022-05-16 LAB — CBC WITH DIFFERENTIAL/PLATELET
Abs Immature Granulocytes: 0.02 10*3/uL (ref 0.00–0.07)
Basophils Absolute: 0.1 10*3/uL (ref 0.0–0.1)
Basophils Relative: 1 %
Eosinophils Absolute: 0.2 10*3/uL (ref 0.0–0.5)
Eosinophils Relative: 2 %
HCT: 41.7 % (ref 39.0–52.0)
Hemoglobin: 13.6 g/dL (ref 13.0–17.0)
Immature Granulocytes: 0 %
Lymphocytes Relative: 42 %
Lymphs Abs: 3 10*3/uL (ref 0.7–4.0)
MCH: 27 pg (ref 26.0–34.0)
MCHC: 32.6 g/dL (ref 30.0–36.0)
MCV: 82.7 fL (ref 80.0–100.0)
Monocytes Absolute: 0.8 10*3/uL (ref 0.1–1.0)
Monocytes Relative: 11 %
Neutro Abs: 3.2 10*3/uL (ref 1.7–7.7)
Neutrophils Relative %: 44 %
Platelets: 141 10*3/uL — ABNORMAL LOW (ref 150–400)
RBC: 5.04 MIL/uL (ref 4.22–5.81)
RDW: 16.4 % — ABNORMAL HIGH (ref 11.5–15.5)
WBC: 7.3 10*3/uL (ref 4.0–10.5)
nRBC: 0 % (ref 0.0–0.2)

## 2022-05-16 LAB — URINALYSIS, ROUTINE W REFLEX MICROSCOPIC
Bacteria, UA: NONE SEEN
Bilirubin Urine: NEGATIVE
Glucose, UA: >=500 mg/dL — AB
Hgb urine dipstick: NEGATIVE
Ketones, ur: NEGATIVE mg/dL
Leukocytes,Ua: NEGATIVE
Nitrite: NEGATIVE
Protein, ur: 100 mg/dL — AB
Specific Gravity, Urine: 1.014 (ref 1.005–1.030)
pH: 5 (ref 5.0–8.0)

## 2022-05-16 NOTE — Progress Notes (Signed)
TOC CSW consulted with pt.  Pt is willing to do PT either at home or SNF.  Pt is open to PT's recommendations.  Pt does live with someone, but she is unable to care for pt.  CSW will update pt on PT's recommendations.  Jaykub Mackins Tarpley-Carter, MSW, LCSW-A Pronouns:  She/Her/Hers [facility] Health[facility]itions of Care Clinical Social Worker Direct Number:  [phone] [email or web address]

## 2022-05-16 NOTE — Progress Notes (Signed)
PT Eval order has been place.  Patient is still being worked up, plan is to have patient evaluated by PT in the am to determine level of care . TOC will follow up in the am.    Braedyn Kauk Tarpley-Carter, MSW, LCSW-A Pronouns:  She/Her/Hers [facility] Health[facility]itions of Care Clinical Social Worker Direct Number:  [phone] [email or web address]

## 2022-05-16 NOTE — ED Provider Notes (Signed)
[facility] Provider Note   CSN: [id number] Arrival date & time: 05/16/22  1528     History  No chief complaint on file.   Austin Mercer. is a 72 y.o. male.  72 yo M with a chief complaint of difficulty ambulating.  The patient tells me that he felt like both of his legs have been weak for some time.  Especially worse after he was discharged from the hospital for a stroke.  He was discharged on Saturday and since then has had at least 3 falls that have required EMS to come and pick him up.  He denies any specific injury in the fall.  He has chronic right knee pain that he thinks has been a little bit worse than baseline.  He feels like both of his legs are weak.  Feels like he is been eating and drinking normally.  No cough congestion or fever.        Home Medications Prior to Admission medications   Medication Sig Start Date End Date Taking? Authorizing Provider  Accu-Chek Softclix Lancets lancets Use as directed to test blood sugar once daily. 09/13/21   [name], MD  amLODipine (NORVASC) 10 MG tablet Take 1 tablet (10 mg total) by mouth once daily. 10/18/21   [name], MD  aspirin EC 81 MG tablet Take 1 tablet (81 mg total) by mouth daily. Swallow whole. 05/13/22   Janine Ores, NP  atorvastatin (LIPITOR) 20 MG tablet Take 1 tablet (20 mg total) by mouth daily. 05/13/22   Janine Ores, NP  Blood Glucose Monitoring Suppl (ACCU-CHEK GUIDE) w/Device KIT Use as directed to test blood sugar once daily. 09/13/21   [name], MD  Blood Pressure Monitoring (BLOOD PRESSURE MONITOR 7) DEVI Use to monitor blood pressure. 03/04/20   [name], MD  carvedilol (COREG) 25 MG tablet TAKE 1 TABLET (25 MG TOTAL) BY MOUTH 2 (TWO) TIMES DAILY WITH A MEAL. Patient taking differently: Take 25 mg by mouth 2 (two) times daily with a meal. 09/13/21   [name], MD  clopidogrel (PLAVIX) 75 MG tablet Take 1 tablet (75 mg  total) by mouth daily. 05/13/22   Janine Ores, NP  dapagliflozin propanediol (FARXIGA) 10 MG TABS tablet Take 1 tablet (10 mg total) by mouth once daily before breakfast. 02/20/22   [name], MD  famotidine (PEPCID) 20 MG tablet Take 1 tablet (20 mg total) by mouth daily. 05/05/22   [name], MD  furosemide (LASIX) 40 MG tablet TAKE 1 TABLET (40 MG TOTAL) BY MOUTH ONCE DAILY. Patient taking differently: Take 40 mg by mouth daily. 10/18/21   [name], MD  losartan (COZAAR) 100 MG tablet Take 1 tablet (100 mg total) by mouth once daily. 09/13/21   [name], MD  metFORMIN (GLUCOPHAGE) 500 MG tablet Take 1 tablet (500 mg total) by mouth 2 (two) times daily with a meal. 05/04/22   [name], MD  potassium chloride (KLOR-CON) 8 MEQ tablet Take 2 tablets (16 mEq total) by mouth 2 (two) times daily. 09/13/21   [name], MD  spironolactone (ALDACTONE) 25 MG tablet Take 1 tablet (25 mg total) by mouth once daily. 09/13/21   [name], MD  tadalafil, PAH, (ADCIRCA) 20 MG tablet take 1 tablet by mouth once daily 1 hour prior to sex as needed. limit use to 1 tab per 24 hours. Patient taking differently: Take  20 mg by mouth daily as needed (For ED). 02/27/22   [name], MD      Allergies    Codeine and Latex    Review of Systems   Review of Systems  Physical Exam Updated Vital Signs BP (!) 165/87   Pulse (!) 56   Temp 98.3 F (36.8 C) (Oral)   Resp 16   SpO2 98%  Physical Exam Vitals and nursing note reviewed.  Constitutional:      Appearance: He is well-developed.  HENT:     Head: Normocephalic and atraumatic.  Eyes:     Pupils: Pupils are equal, round, and reactive to light.  Neck:     Vascular: No JVD.  Cardiovascular:     Rate and Rhythm: Normal rate and regular rhythm.     Heart sounds: No murmur heard.    No friction rub. No gallop.  Pulmonary:     Effort: No respiratory distress.     Breath sounds: No wheezing.   Abdominal:     General: There is no distension.     Tenderness: There is no abdominal tenderness. There is no guarding or rebound.  Musculoskeletal:        General: Normal range of motion.     Cervical back: Normal range of motion and neck supple.  Skin:    Coloration: Skin is not pale.     Findings: No rash.  Neurological:     Mental Status: He is alert and oriented to person, place, and time.     Comments: No obvious focal weakness.   Psychiatric:        Behavior: Behavior normal.     ED Results / Procedures / Treatments   Labs (all labs ordered are listed, but only abnormal results are displayed) Labs Reviewed  COMPREHENSIVE METABOLIC PANEL - Abnormal; Notable for the following components:      Result Value   Sodium 133 (*)    CO2 21 (*)    Glucose, Bld 144 (*)    Creatinine, Ser 1.64 (*)    Calcium 10.6 (*)    GFR, Estimated 45 (*)    All other components within normal limits  CBC WITH DIFFERENTIAL/PLATELET - Abnormal; Notable for the following components:   RDW 16.4 (*)    Platelets 141 (*)    All other components within normal limits  URINALYSIS, ROUTINE W REFLEX MICROSCOPIC - Abnormal; Notable for the following components:   Glucose, UA >=500 (*)    Protein, ur 100 (*)    All other components within normal limits  MAGNESIUM    EKG None  Radiology CT Head Wo Contrast  Result Date: 05/16/2022 CLINICAL DATA:  Recent stroke with increased bilateral leg weakness unable to stand EXAM: CT HEAD WITHOUT CONTRAST TECHNIQUE: Contiguous axial images were obtained from the base of the skull through the vertex without intravenous contrast. RADIATION DOSE REDUCTION: This exam was performed according to the departmental dose-optimization program which includes automated exposure control, adjustment of the mA and/or kV according to patient size and/or use of iterative reconstruction technique. COMPARISON:  CT 05/12/2022, MRI 05/13/2022 FINDINGS: Brain: No acute territorial  infarction, hemorrhage, or intracranial mass. Atrophy and chronic small vessel ischemic changes of the white matter. 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 Recently demonstrated small parietooccipital cortical infarcts on MRI are not well seen by CT. The ventricles are stable in size. Vascular: No hyperdense vessels.  Carotid vascular calcification Skull: Normal. Negative for fracture or  focal lesion. Sinuses/Orbits: No acute finding. Other: None IMPRESSION: 1. No CT evidence for acute intracranial abnormality. 2. Atrophy and chronic small vessel ischemic changes of the white matter. 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 Recently demonstrated small cortical infarcts on MRI are not well seen by CT Electronically Signed   By: Donavan Foil M.D.   On: 05/16/2022 17:30   DG Chest 1 View  Result Date: 05/16/2022 CLINICAL DATA:  Pt having leg and arm weakness, feeling overall weak since yesterday - hx of stroke, htn, diabetes EXAM: CHEST  1 VIEW COMPARISON:  03/06/2020 FINDINGS: Lungs are clear. Heart size and mediastinal contours are within normal limits. Aortic Atherosclerosis (ICD10-170.0). No effusion. Visualized bones unremarkable. IMPRESSION: No acute cardiopulmonary disease. Electronically Signed   By: Lucrezia Europe M.D.   On: 05/16/2022 16:12    Procedures Procedures    Medications Ordered in ED Medications - No data to display  ED Course/ Medical Decision Making/ A&P                             Medical Decision Making Amount and/or Complexity of Data Reviewed Radiology: ordered.   72 yo M with a chief complaints of bilateral leg weakness.  Has been going on since he was discharged.  Could be decompensation after hospital admission.  Tells me he had trouble walking before he was admitted for his stroke.  Workup here with a very mild increase of his creatinine, no urinary tract infection, chest x-ray independently turbid  by me without focal infiltrate.  CT of the head without obvious acute change.  Will have social work consult for possible rehab placement.  I discussed the case with Dr. Lorrin Goodell, he recommends getting an MRI of the brain.  If MRI positive will discuss with neurology, if negative plan for PT OT in the morning for possible placement.  Patient care signed out to Dr. Betsey Holiday, please see his note for further details care in the ED.  The patients results and plan were reviewed and discussed.   Any x-rays performed were independently reviewed by myself.   Differential diagnosis were considered with the presenting HPI.  Medications - No data to display  Vitals:   05/16/22 2045 05/16/22 2115 05/16/22 2130 05/16/22 2200  BP: (!) 156/79 (!) 157/87 (!) 152/83 (!) 165/87  Pulse: 60 (!) 56 (!) 58 (!) 56  Resp: (!) 22 17 10 16  $ Temp:      TempSrc:      SpO2: 99% 97% 100% 98%    Final diagnoses:  Leg weakness, bilateral    Admission/ observation were discussed with the admitting physician, patient and/or family and they are comfortable with the plan.          Final Clinical Impression(s) / ED Diagnoses Final diagnoses:  Leg weakness, bilateral    Rx / DC Orders ED Discharge Orders     None         Deno Etienne, DO 05/16/22 2330

## 2022-05-16 NOTE — Transitions of Care (Post Inpatient/ED Visit) (Signed)
   05/16/2022  Name: Austin Mercer. MRN: [id number] DOB: [birth year]  Today's TOC FU Call Status: Today's TOC FU Call Status:: Unsuccessful Call (3rd Attempt) Unsuccessful Call (3rd Attempt) Date: 05/16/22  Attempted to reach the patient regarding the most recent Inpatient/ED visit.  Follow Up Plan: No further outreach attempts will be made at this time. We have been unable to contact the patient.     Enzo Montgomery, RN,BSN,CCM [facility] Telephonic Care Management Coordinator Direct Phone: [phone] Toll Free: [phone] Fax: [phone]

## 2022-05-16 NOTE — ED Triage Notes (Addendum)
Pt bib ems from home; discharged Saturday after having stroke; increased bilateral leg weakness today, unable to stand; slid from toilet to ground; did not hit head, no loc; equal strength bilaterally,no other deficits ; 160/90, P 64, 97% ra, CBG 132

## 2022-05-16 NOTE — Transitions of Care (Post Inpatient/ED Visit) (Signed)
   05/16/2022  Name: [name]. MRN: [id number] DOB: [birth year]  Today's TOC FU Call Status: Today's TOC FU Call Status:: Unsuccessful Call (2nd Attempt) Unsuccessful Call (2nd Attempt) Date: 05/16/22  Attempted to reach the patient regarding the most recent Inpatient/ED visit.  Follow Up Plan: Additional outreach attempts will be made to reach the patient to complete the Transitions of Care (Post Inpatient/ED visit) call.     Enzo Montgomery, RN,BSN,CCM [facility] Telephonic Care Management Coordinator Direct Phone: [phone] Toll Free: [phone] Fax: [phone]

## 2022-05-16 NOTE — ED Provider Triage Note (Signed)
Emergency Medicine Provider Triage Evaluation Note  Austin Mercer. , a 72 y.o. male  was evaluated in triage.  Pt complains of leg weakness.  Patient reports that he was diagnosed with stroke on 05/11/2022.  Patient reports he is currently taking the clopidogrel that he was given at time of discharge.  Patient denies any weakness in upper extremities or face.  Reports that weakness is symmetrical in lower extremities.  Review of Systems  Positive: As above Negative: As above  Physical Exam  BP (!) 130/93 (BP Location: Left Arm)   Pulse 69   Temp 98.3 F (36.8 C) (Oral)   Resp 17   SpO2 96%  Gen:   Awake, no distress Resp:  Normal effort  MSK:   Moves extremities without difficulty Other:  No neurological deficit noticed on exam. CNs intact.  Medical Decision Making  Medically screening exam initiated at 3:41 PM.  Appropriate orders placed.  Austin Mercer. was informed that the remainder of the evaluation will be completed by another provider, this initial triage assessment does not replace that evaluation, and the importance of remaining in the ED until their evaluation is complete.     Luvenia Heller, PA-C 05/16/22 [phone]

## 2022-05-17 ENCOUNTER — Emergency Department (HOSPITAL_COMMUNITY): Payer: Medicare PPO

## 2022-05-17 ENCOUNTER — Other Ambulatory Visit: Payer: Self-pay

## 2022-05-17 DIAGNOSIS — I63519 Cerebral infarction due to unspecified occlusion or stenosis of unspecified middle cerebral artery: Secondary | ICD-10-CM | POA: Diagnosis not present

## 2022-05-17 DIAGNOSIS — Z833 Family history of diabetes mellitus: Secondary | ICD-10-CM | POA: Diagnosis not present

## 2022-05-17 DIAGNOSIS — I639 Cerebral infarction, unspecified: Secondary | ICD-10-CM | POA: Diagnosis present

## 2022-05-17 DIAGNOSIS — Z6841 Body Mass Index (BMI) 40.0 and over, adult: Secondary | ICD-10-CM

## 2022-05-17 DIAGNOSIS — E785 Hyperlipidemia, unspecified: Secondary | ICD-10-CM | POA: Diagnosis present

## 2022-05-17 DIAGNOSIS — Z87891 Personal history of nicotine dependence: Secondary | ICD-10-CM | POA: Diagnosis not present

## 2022-05-17 DIAGNOSIS — R262 Difficulty in walking, not elsewhere classified: Secondary | ICD-10-CM | POA: Diagnosis present

## 2022-05-17 DIAGNOSIS — Y92012 Bathroom of single-family (private) house as the place of occurrence of the external cause: Secondary | ICD-10-CM | POA: Diagnosis not present

## 2022-05-17 DIAGNOSIS — N1831 Chronic kidney disease, stage 3a: Secondary | ICD-10-CM | POA: Diagnosis present

## 2022-05-17 DIAGNOSIS — I130 Hypertensive heart and chronic kidney disease with heart failure and stage 1 through stage 4 chronic kidney disease, or unspecified chronic kidney disease: Secondary | ICD-10-CM | POA: Diagnosis present

## 2022-05-17 DIAGNOSIS — E119 Type 2 diabetes mellitus without complications: Secondary | ICD-10-CM

## 2022-05-17 DIAGNOSIS — E1122 Type 2 diabetes mellitus with diabetic chronic kidney disease: Secondary | ICD-10-CM | POA: Diagnosis present

## 2022-05-17 DIAGNOSIS — I6349 Cerebral infarction due to embolism of other cerebral artery: Secondary | ICD-10-CM | POA: Diagnosis present

## 2022-05-17 DIAGNOSIS — I6329 Cerebral infarction due to unspecified occlusion or stenosis of other precerebral arteries: Secondary | ICD-10-CM | POA: Diagnosis present

## 2022-05-17 DIAGNOSIS — I5022 Chronic systolic (congestive) heart failure: Secondary | ICD-10-CM | POA: Diagnosis present

## 2022-05-17 DIAGNOSIS — Z87898 Personal history of other specified conditions: Secondary | ICD-10-CM

## 2022-05-17 DIAGNOSIS — K5900 Constipation, unspecified: Secondary | ICD-10-CM | POA: Diagnosis present

## 2022-05-17 DIAGNOSIS — Z9889 Other specified postprocedural states: Secondary | ICD-10-CM | POA: Diagnosis not present

## 2022-05-17 DIAGNOSIS — G8311 Monoplegia of lower limb affecting right dominant side: Secondary | ICD-10-CM | POA: Diagnosis present

## 2022-05-17 DIAGNOSIS — G8929 Other chronic pain: Secondary | ICD-10-CM | POA: Diagnosis present

## 2022-05-17 DIAGNOSIS — R29701 NIHSS score 1: Secondary | ICD-10-CM | POA: Diagnosis present

## 2022-05-17 DIAGNOSIS — G8314 Monoplegia of lower limb affecting left nondominant side: Secondary | ICD-10-CM | POA: Diagnosis present

## 2022-05-17 DIAGNOSIS — F1010 Alcohol abuse, uncomplicated: Secondary | ICD-10-CM | POA: Diagnosis present

## 2022-05-17 DIAGNOSIS — W19XXXA Unspecified fall, initial encounter: Secondary | ICD-10-CM | POA: Diagnosis present

## 2022-05-17 DIAGNOSIS — K7030 Alcoholic cirrhosis of liver without ascites: Secondary | ICD-10-CM

## 2022-05-17 DIAGNOSIS — Z8619 Personal history of other infectious and parasitic diseases: Secondary | ICD-10-CM | POA: Diagnosis not present

## 2022-05-17 DIAGNOSIS — I10 Essential (primary) hypertension: Secondary | ICD-10-CM | POA: Diagnosis not present

## 2022-05-17 DIAGNOSIS — Z8673 Personal history of transient ischemic attack (TIA), and cerebral infarction without residual deficits: Secondary | ICD-10-CM | POA: Diagnosis not present

## 2022-05-17 DIAGNOSIS — K7460 Unspecified cirrhosis of liver: Secondary | ICD-10-CM | POA: Diagnosis present

## 2022-05-17 DIAGNOSIS — G9349 Other encephalopathy: Secondary | ICD-10-CM | POA: Diagnosis present

## 2022-05-17 LAB — GLUCOSE, CAPILLARY: Glucose-Capillary: 113 mg/dL — ABNORMAL HIGH (ref 70–99)

## 2022-05-17 LAB — CBG MONITORING, ED: Glucose-Capillary: 183 mg/dL — ABNORMAL HIGH (ref 70–99)

## 2022-05-17 MED ORDER — DAPAGLIFLOZIN PROPANEDIOL 10 MG PO TABS
10.0000 mg | ORAL_TABLET | Freq: Every day | ORAL | Status: DC
  Administered 2022-05-18 – 2022-05-22 (×5): 10 mg via ORAL
  Filled 2022-05-17 (×5): qty 1

## 2022-05-17 MED ORDER — ENOXAPARIN SODIUM 40 MG/0.4ML IJ SOSY
40.0000 mg | PREFILLED_SYRINGE | INTRAMUSCULAR | Status: DC
  Administered 2022-05-17 – 2022-05-22 (×6): 40 mg via SUBCUTANEOUS
  Filled 2022-05-17 (×6): qty 0.4

## 2022-05-17 MED ORDER — IOHEXOL 350 MG/ML SOLN
100.0000 mL | Freq: Once | INTRAVENOUS | Status: AC | PRN
  Administered 2022-05-17: 100 mL via INTRAVENOUS

## 2022-05-17 MED ORDER — ACETAMINOPHEN 325 MG PO TABS
650.0000 mg | ORAL_TABLET | ORAL | Status: DC | PRN
  Administered 2022-05-17: 650 mg via ORAL
  Filled 2022-05-17: qty 2

## 2022-05-17 MED ORDER — AMLODIPINE BESYLATE 10 MG PO TABS
10.0000 mg | ORAL_TABLET | Freq: Every day | ORAL | Status: DC
  Administered 2022-05-17 – 2022-05-22 (×6): 10 mg via ORAL
  Filled 2022-05-17: qty 1
  Filled 2022-05-17: qty 2
  Filled 2022-05-17 (×4): qty 1

## 2022-05-17 MED ORDER — ASPIRIN 81 MG PO TBEC
81.0000 mg | DELAYED_RELEASE_TABLET | Freq: Every day | ORAL | Status: DC
  Administered 2022-05-17 – 2022-05-22 (×6): 81 mg via ORAL
  Filled 2022-05-17 (×6): qty 1

## 2022-05-17 MED ORDER — FAMOTIDINE 20 MG PO TABS
20.0000 mg | ORAL_TABLET | Freq: Every day | ORAL | Status: DC
  Administered 2022-05-17 – 2022-05-22 (×6): 20 mg via ORAL
  Filled 2022-05-17 (×6): qty 1

## 2022-05-17 MED ORDER — FUROSEMIDE 10 MG/ML IJ SOLN
20.0000 mg | Freq: Every day | INTRAMUSCULAR | Status: DC
  Administered 2022-05-17 – 2022-05-22 (×6): 20 mg via INTRAVENOUS
  Filled 2022-05-17 (×3): qty 4
  Filled 2022-05-17: qty 2
  Filled 2022-05-17 (×2): qty 4

## 2022-05-17 MED ORDER — INSULIN ASPART 100 UNIT/ML IJ SOLN
0.0000 [IU] | Freq: Three times a day (TID) | INTRAMUSCULAR | Status: DC
  Administered 2022-05-17 – 2022-05-19 (×5): 3 [IU] via SUBCUTANEOUS
  Administered 2022-05-19 – 2022-05-20 (×3): 2 [IU] via SUBCUTANEOUS
  Administered 2022-05-20: 3 [IU] via SUBCUTANEOUS
  Administered 2022-05-21 – 2022-05-22 (×3): 2 [IU] via SUBCUTANEOUS

## 2022-05-17 MED ORDER — STROKE: EARLY STAGES OF RECOVERY BOOK
Freq: Once | Status: AC
  Administered 2022-05-18: 1
  Filled 2022-05-17: qty 1

## 2022-05-17 MED ORDER — CLOPIDOGREL BISULFATE 75 MG PO TABS
75.0000 mg | ORAL_TABLET | Freq: Every day | ORAL | Status: DC
  Administered 2022-05-17 – 2022-05-22 (×6): 75 mg via ORAL
  Filled 2022-05-17 (×6): qty 1

## 2022-05-17 MED ORDER — ACETAMINOPHEN 160 MG/5ML PO SOLN: 650.0000 mg | ORAL | Status: DC | PRN

## 2022-05-17 MED ORDER — CARVEDILOL 12.5 MG PO TABS
12.5000 mg | ORAL_TABLET | Freq: Two times a day (BID) | ORAL | Status: DC
  Administered 2022-05-17 – 2022-05-22 (×10): 12.5 mg via ORAL
  Filled 2022-05-17 (×10): qty 1

## 2022-05-17 MED ORDER — SODIUM CHLORIDE 0.9 % IV SOLN: INTRAVENOUS | Status: DC

## 2022-05-17 MED ORDER — ATORVASTATIN CALCIUM 10 MG PO TABS
20.0000 mg | ORAL_TABLET | Freq: Every day | ORAL | Status: DC
  Administered 2022-05-17 – 2022-05-22 (×6): 20 mg via ORAL
  Filled 2022-05-17 (×6): qty 2

## 2022-05-17 MED ORDER — SPIRONOLACTONE 25 MG PO TABS
25.0000 mg | ORAL_TABLET | Freq: Every day | ORAL | Status: DC
  Administered 2022-05-17 – 2022-05-22 (×6): 25 mg via ORAL
  Filled 2022-05-17 (×5): qty 1
  Filled 2022-05-17: qty 2

## 2022-05-17 MED ORDER — ACETAMINOPHEN 650 MG RE SUPP: 650.0000 mg | RECTAL | Status: DC | PRN

## 2022-05-17 MED ORDER — FUROSEMIDE 20 MG PO TABS
40.0000 mg | ORAL_TABLET | Freq: Every day | ORAL | Status: DC
  Filled 2022-05-17: qty 2

## 2022-05-17 MED ORDER — POTASSIUM CHLORIDE ER 8 MEQ PO TBCR: 16.0000 meq | EXTENDED_RELEASE_TABLET | Freq: Two times a day (BID) | ORAL | Status: DC

## 2022-05-17 NOTE — ED Provider Notes (Signed)
Discussed with Neurology team this AM.  Given new stroke and weakness, will admit for continued monitoring and working with PT. Ordered CT C/A/P per night neurology recommendations for screening for malignancy/hypercoagulability.   Gareth Morgan, MD 05/17/22 1016

## 2022-05-17 NOTE — H&P (Addendum)
History and Physical    DOA: 05/16/2022  PCP: [name], MD  Patient coming from: home  Chief Complaint: rt leg weakness, gait disturbance  HPI: Austin Mercer. is a 72 y.o. male with history h/o obesity, diabetes mellitus type 2, hypertension who was recently admitted to this facility for CVA-bilateral embolic stroke s/p tnkase and discharged home about 3 days ago with DAPT, now presents with complaints of worsening right leg weakness and inability to walk.  Patient reports that he was probably weak in his right leg since last discharge but after using the bathroom today he fell-slid down to the floor and had difficulty in getting up and ambulating prompting ED presentation.  Patient apparently had to be helped by EMS to get up from the bathroom floor.  He states he was able to ambulate without assistive devices but had to take small steps due to bilateral lower extremity weakness since last discharge.  He was also able to climb 8 concrete stairs that lead to his home after last discharge.  He states he feels his right leg is slightly weaker than usual but denies any tingling or numbness or vision changes or trouble swallowing or headache or other focal deficits.  He states he quit smoking many years back.  He was a heavy alcohol user in the past but states only drinks beer occasionally now.  Last drink was 2 weeks back. ED course: Afebrile, pulse 50-69, respiratory rate 18, BP 126/71-165/87.  Labs showed WBC 7.3, hemoglobin 13.6, platelet 141, sodium 133, potassium 4.7, chloride 104, bicarb 21, BUN 19, creatinine 1.64, calcium 10.6, glucose 144, magnesium 1.9.  MRI brain demonstrates acute left pontine stroke in addition toRecent/subacute bilateral occipital and left parietal strokes.  Patient seen by neurology, noted to have mild right lower extremity weakness on exam.  Recommended admission for further management and possible rehab placement.  CT chest abdomen/pelvis ordered for malignancy  screen.     Review of Systems: As per HPI, otherwise review of systems negative.    Past Medical History:  Diagnosis Date   Diabetes ([place])    Hypertension    Hypertensive urgency 03/04/2020   Stroke [facility])     Past Surgical History:  Procedure Laterality Date   ANKLE FRACTURE SURGERY      Social history:  reports that he quit smoking about 51 years ago. His smoking use included cigarettes. He has never used smokeless tobacco. He reports current alcohol use. He reports that he does not currently use drugs.   Allergies  Allergen Reactions   Codeine Other (See Comments)   Latex Itching    Family History  Problem Relation Age of Onset   Diabetes Father       Prior to Admission medications   Medication Sig Start Date End Date Taking? Authorizing Provider  Accu-Chek Softclix Lancets lancets Use as directed to test blood sugar once daily. 09/13/21   [name], MD  amLODipine (NORVASC) 10 MG tablet Take 1 tablet (10 mg total) by mouth once daily. 10/18/21   [name], MD  aspirin EC 81 MG tablet Take 1 tablet (81 mg total) by mouth daily. Swallow whole. 05/13/22   Janine Ores, NP  atorvastatin (LIPITOR) 20 MG tablet Take 1 tablet (20 mg total) by mouth daily. 05/13/22   Janine Ores, NP  Blood Glucose Monitoring Suppl (ACCU-CHEK GUIDE) w/Device KIT Use as directed to test blood sugar once daily. 09/13/21   [name], MD  Blood Pressure Monitoring (  BLOOD PRESSURE MONITOR 7) DEVI Use to monitor blood pressure. 03/04/20   [name], MD  carvedilol (COREG) 25 MG tablet TAKE 1 TABLET (25 MG TOTAL) BY MOUTH 2 (TWO) TIMES DAILY WITH A MEAL. Patient taking differently: Take 25 mg by mouth 2 (two) times daily with a meal. 09/13/21   [name], MD  clopidogrel (PLAVIX) 75 MG tablet Take 1 tablet (75 mg total) by mouth daily. 05/13/22   Janine Ores, NP  dapagliflozin propanediol (FARXIGA) 10 MG TABS tablet Take 1 tablet (10 mg total) by mouth once daily  before breakfast. 02/20/22   [name], MD  famotidine (PEPCID) 20 MG tablet Take 1 tablet (20 mg total) by mouth daily. 05/05/22   [name], MD  furosemide (LASIX) 40 MG tablet TAKE 1 TABLET (40 MG TOTAL) BY MOUTH ONCE DAILY. Patient taking differently: Take 40 mg by mouth daily. 10/18/21   [name], MD  losartan (COZAAR) 100 MG tablet Take 1 tablet (100 mg total) by mouth once daily. 09/13/21   [name], MD  metFORMIN (GLUCOPHAGE) 500 MG tablet Take 1 tablet (500 mg total) by mouth 2 (two) times daily with a meal. 05/04/22   [name], MD  potassium chloride (KLOR-CON) 8 MEQ tablet Take 2 tablets (16 mEq total) by mouth 2 (two) times daily. 09/13/21   [name], MD  spironolactone (ALDACTONE) 25 MG tablet Take 1 tablet (25 mg total) by mouth once daily. 09/13/21   [name], MD  tadalafil, PAH, (ADCIRCA) 20 MG tablet take 1 tablet by mouth once daily 1 hour prior to sex as needed. limit use to 1 tab per 24 hours. Patient taking differently: Take 20 mg by mouth daily as needed (For ED). 02/27/22   [name], MD    Physical Exam: Vitals:   05/17/22 0830 05/17/22 0900 05/17/22 0900 05/17/22 1030  BP: (!) 154/79 (!) 162/94  (!) 148/74  Pulse: (!) 57 60  61  Resp: 18 15  18  $ Temp:   (!) 97.3 F (36.3 C)   TempSrc:   Oral   SpO2: 97% 98%  100%    Constitutional: NAD, calm, comfortable, asking to eat.  Speech normal Eyes: PERRL, lids and conjunctivae normal ENMT: Mucous membranes are moist. Posterior pharynx clear of any exudate or lesions.Normal dentition.  Neck: normal, supple, no masses, no thyromegaly Respiratory: clear to auscultation bilaterally, no wheezing, no crackles. Normal respiratory effort. No accessory muscle use.  Cardiovascular: Regular, distant heart sounds but bradycardic, no murmurs / rubs / gallops.  2+ pitting bilateral lower extremity edema. 2+ pedal pulses. No carotid bruits.  Abdomen: no tenderness, no masses  palpated. No hepatosplenomegaly. Bowel sounds positive.  Musculoskeletal: no clubbing / cyanosis. No joint deformity upper and lower extremities. Good ROM, no contractures. Normal muscle tone.  Neurologic: CN 2-12 grossly intact. Sensation intact, DTR normal.  No pronator drift and good grip strength in bilateral upper extremities.  He appears to have 4/5 strength in bilateral lower extremities on my exam (per neurologist right lower extremity greater than left lower extremity weakness) Psychiatric: Normal judgment and insight. Alert and oriented x 3. Normal mood.  SKIN/catheters: Chronic stasis dermatitis noted in bilateral lower extremities along with edema  Labs on Admission: I have personally reviewed following labs and imaging studies  CBC: Recent Labs  Lab 05/11/22 0849 05/11/22 0858 05/11/22 1119 05/16/22 1547  WBC 5.0  --   --  7.3  NEUTROABS  1.6*  --   --  3.2  HGB 13.2 13.6 13.3 13.6  HCT 39.8 40.0 39.0 41.7  MCV 80.7  --   --  82.7  PLT 136*  --   --  [id number]*   Basic Metabolic Panel: Recent Labs  Lab 05/11/22 0849 05/11/22 0858 05/11/22 1119 05/13/22 0304 05/16/22 1547  NA 137 137 136 131* 133*  K 3.4* 4.3 4.0 4.0 4.7  CL 113* 106  --  102 104  CO2 19*  --   --  20* 21*  GLUCOSE 113* 126*  --  127* 144*  BUN 14 17  --  19 19  CREATININE 1.08 1.30*  --  1.32* 1.64*  CALCIUM 8.5*  --   --  10.4* 10.6*  MG  --   --   --   --  1.9   GFR: Estimated Creatinine Clearance: 66.4 mL/min (A) (by C-G formula based on SCr of 1.64 mg/dL (H)). Recent Labs  Lab 05/11/22 0849 05/16/22 1547  WBC 5.0 7.3   Liver Function Tests: Recent Labs  Lab 05/11/22 0849 05/16/22 1547  AST 17 25  ALT 14 22  ALKPHOS 28* 50  BILITOT 0.6 0.3  PROT 5.8* 7.6  ALBUMIN 2.8* 3.9   No results for input(s): "LIPASE", "AMYLASE" in the last 168 hours. No results for input(s): "AMMONIA" in the last 168 hours. Coagulation Profile: Recent Labs  Lab 05/11/22 0849  INR 1.2   Cardiac  Enzymes: No results for input(s): "CKTOTAL", "CKMB", "CKMBINDEX", "TROPONINI" in the last 168 hours. BNP (last 3 results) No results for input(s): "PROBNP" in the last 8760 hours. HbA1C: No results for input(s): "HGBA1C" in the last 72 hours. CBG: Recent Labs  Lab 05/12/22 1141 05/12/22 1744 05/12/22 2134 05/13/22 0559 05/13/22 1205  GLUCAP 137* 132* 209* 136* 106*   Lipid Profile: No results for input(s): "CHOL", "HDL", "LDLCALC", "TRIG", "CHOLHDL", "LDLDIRECT" in the last 72 hours. Thyroid Function Tests: No results for input(s): "TSH", "T4TOTAL", "FREET4", "T3FREE", "THYROIDAB" in the last 72 hours. Anemia Panel: No results for input(s): "VITAMINB12", "FOLATE", "FERRITIN", "TIBC", "IRON", "RETICCTPCT" in the last 72 hours. Urine analysis:    Component Value Date/Time   COLORURINE YELLOW 05/16/2022 1636   APPEARANCEUR CLEAR 05/16/2022 1636   LABSPEC 1.014 05/16/2022 1636   PHURINE 5.0 05/16/2022 1636   GLUCOSEU >=500 (A) 05/16/2022 1636   HGBUR NEGATIVE 05/16/2022 1636   BILIRUBINUR NEGATIVE 05/16/2022 1636   KETONESUR NEGATIVE 05/16/2022 1636   PROTEINUR 100 (A) 05/16/2022 1636   NITRITE NEGATIVE 05/16/2022 1636   LEUKOCYTESUR NEGATIVE 05/16/2022 1636    Radiological Exams on Admission: Personally reviewed  MR BRAIN WO CONTRAST  Result Date: 05/17/2022 CLINICAL DATA:  Stroke suspected, increased bilateral leg weakness EXAM: MRI HEAD WITHOUT CONTRAST TECHNIQUE: Multiplanar, multiecho pulse sequences of the brain and surrounding structures were obtained without intravenous contrast. COMPARISON:  05/13/2021 FINDINGS: Brain: New area of restricted diffusion with ADC correlate in the left pons (series 2, images 15-17 and series 3, image 17), consistent with acute infarct. Other foci of mildly increased signal on diffusion-weighted imaging bilateral occipital lobes and left parietal lobe were present on the prior exam, likely subacute infarcts. No acute hemorrhage, mass, mass  effect, or midline shift. No hydrocephalus or extra-axial collection. T2 hyperintense signal in the periventricular white matter, likely the sequela of moderate chronic small vessel ischemic disease. Remote lacunar infarcts in the right greater than left basal ganglia. Vascular: Normal arterial flow voids. Skull and upper cervical spine: Normal  marrow signal. Sinuses/Orbits: No acute finding. Other: Trace fluid in right mastoid air cells. IMPRESSION: 1. 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 2. Redemonstrated subacute infarcts in the bilateral occipital lobes and left parietal lobe. These results were called by telephone at the time of interpretation on 05/16/2022 at 11:59 pm to provider POLLINA, who verbally acknowledged these results. Electronically Signed   By: Merilyn Baba M.D.   On: 05/17/2022 00:02   CT Head Wo Contrast  Result Date: 05/16/2022 CLINICAL DATA:  Recent stroke with increased bilateral leg weakness unable to stand EXAM: CT HEAD WITHOUT CONTRAST TECHNIQUE: Contiguous axial images were obtained from the base of the skull through the vertex without intravenous contrast. RADIATION DOSE REDUCTION: This exam was performed according to the departmental dose-optimization program which includes automated exposure control, adjustment of the mA and/or kV according to patient size and/or use of iterative reconstruction technique. COMPARISON:  CT 05/12/2022, MRI 05/13/2022 FINDINGS: Brain: No acute territorial infarction, hemorrhage, or intracranial mass. Atrophy and chronic small vessel ischemic changes of the white matter. 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 Recently demonstrated small parietooccipital cortical infarcts on MRI are not well seen by CT. The ventricles are stable in size. Vascular: No hyperdense vessels.  Carotid vascular calcification Skull: Normal. Negative for fracture or focal lesion. Sinuses/Orbits: No acute finding. Other: None IMPRESSION: 1. No  CT evidence for acute intracranial abnormality. 2. Atrophy and chronic small vessel ischemic changes of the white matter. 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 Recently demonstrated small cortical infarcts on MRI are not well seen by CT Electronically Signed   By: Donavan Foil M.D.   On: 05/16/2022 17:30   DG Chest 1 View  Result Date: 05/16/2022 CLINICAL DATA:  Pt having leg and arm weakness, feeling overall weak since yesterday - hx of stroke, htn, diabetes EXAM: CHEST  1 VIEW COMPARISON:  03/06/2020 FINDINGS: Lungs are clear. Heart size and mediastinal contours are within normal limits. Aortic Atherosclerosis (ICD10-170.0). No effusion. Visualized bones unremarkable. IMPRESSION: No acute cardiopulmonary disease. Electronically Signed   By: Lucrezia Europe M.D.   On: 05/16/2022 16:12    EKG: Independently reviewed.  Sinus rhythm and right bundle branch block on prior EKG from February 8.  Will repeat now.     Assessment and Plan:   Principal Problem:   Acute ischemic stroke [facility]) Active Problems:   Controlled type 2 diabetes mellitus without complication, without long-term current use of insulin (HCC)   Essential hypertension   Class 3 severe obesity due to excess calories with serious comorbidity and body mass index (BMI) of 45.0 to 49.9 in adult [facility])   Hepatitis C virus infection cured after antiviral drug therapy   History of alcohol use disorder   Liver cirrhosis ([place])   CVA (cerebral vascular accident) ([place])    1.Acute on subacute CVA: Patient recently admitted to this hospital for bilateral parietal occipital cardio-embolic strokes and received TNK.  Patient was found to have advanced and widespread intracranial atherosclerosis and some associated midbrain wallerian degeneration.  Patient was discharged with DAPT and recommended outpatient cardiology APP follow-up for loop recorder placement.  Echo showed preserved EF.  He did have other workup  like venous Doppler lower extremity which was negative for DVT and EEG which only showed moderate diffuse encephalopathy.  Will resume DAPT as discussed with neurology (did not recommend anticoagulation) and plan for loop recorder at the time of discharge.  Will send  hypercoagulable workup and follow-up CT chest abdomen pelvis results.  Permissive hypertension.  PT/OT for ataxia, lower extremity weakness-might need rehab placement.  2.  Hypertension/chronic systolic CHF: Patient does have bilateral lower extremity edema and is on diuretics at home.  Recent echo with normal EF.  Resume home medications-Lasix, Aldactone and low-dose Coreg.  Hold losartan for now for permissive hypertension May resume prior to discharge.  Will give 1 dose of IV Lasix to help with leg edema which might also help him with ambulation.  3.  Hyperlipidemia: Lipitor dosage was increased to 20 mg in last admission, will resume the same.  Per last discharge summary no HD dosage as LDL at goal.  Repeat lipid profile in a.m. and consider high density statin given recurrent strokes  4.  Diabetes mellitus type 2: Resume oral Farxiga.  Hold metformin for now and resume prior to discharge.  Sliding scale insulin while here.  5.  Hepatitis C/liver cirrhosis: Treated for hepatitis C with antivirals.  Resumed home diuretics-Aldactone and change Lasix to IV to help with leg edema and ambulation.  Follow-up CT abdomen results.  6.  Chronic alcohol use: Patient reports that he was a heavy drinker in the past but has drastically cut down over the last year or so.  He now only drinks beer occasionally.  Last drink was 2 weeks prior.  Denies any withdrawal symptoms.  He quit smoking many years back and denies any drug use.  Counseled to quit alcohol completely given recurrent strokes and predisposition.  7. Morbid obesity class III with BMI 40-49: Follow-up PCP for long-term strategies..  On metformin for diabetes.     DVT prophylaxis:  Lovenox   Code Status: Full code NOK-sister [name] would be his healthcare proxy Patient/Family Communication: Discussed with patient and all questions answered to satisfaction.  Consults called: Neurology Admission status :I certify that at the point of admission it is my clinical judgment that the patient will require inpatient hospital care spanning beyond 2 midnights from the point of admission due to high intensity of service and high frequency of surveillance required.Inpatient status is judged to be reasonable and necessary in order to provide the required intensity of service to ensure the patient's safety. The patient's presenting symptoms, physical exam findings, and initial radiographic and laboratory data in the context of their chronic comorbidities is felt to place them at high risk for further clinical deterioration. The following factors support the patient status of inpatient : Acute stroke with new ataxia/lower extremity weakness.     Guilford Shi MD [facility] Pager in [place]  If 7PM-7AM, please contact night-coverage [email or web address]   05/17/2022, 10:56 AM

## 2022-05-17 NOTE — ED Notes (Signed)
With Pt's permission, this writer spoke and provided an update to [employer].  Ms. Shorter reports they live on the 2nd floor and they must navigate 15 cement steps.  She sts he is unsafe to come home.

## 2022-05-17 NOTE — ED Notes (Signed)
Neurology at bedside.

## 2022-05-17 NOTE — ED Notes (Signed)
Patient transported to CT 

## 2022-05-17 NOTE — ED Notes (Signed)
Pt requested to sit on the side of his bed.  Pt needed moderate assist w/ movement.  Also, Pt c/o room temperature.  Thermostat adjusted.

## 2022-05-17 NOTE — ED Provider Notes (Signed)
Patient signed out to me by Dr. Tyrone Nine with MRI pending.  Patient with recent stroke, returns with new weakness.  MRI has been performed and does show a new area of stroke.  Patient has been evaluated by Dr. Lorrin Goodell of neurology.  He does not require further workup for this stroke.  Will obtain [facility] consultation for nursing home placement.   Orpah Greek, MD 05/17/22 [phone]

## 2022-05-17 NOTE — ED Notes (Signed)
Pt given a sandwich bag and breakfast tray.

## 2022-05-17 NOTE — Evaluation (Signed)
Occupational Therapy Evaluation Patient Details Name: Austin Mercer. MRN: [id number] DOB: July 15, 1950 Today's Date: 05/17/2022   History of Present Illness 72 yo male with onset of increased RLE weakness and pain was admitted 2/13 after having falls, notable edema on RLE knee.  Pt has reported inability to walk, after sliding down on BR floor.  Was just seen for new stroke three days prior to admit and sent home. 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 PMHx:  obesity, DM, HTN, CVA, HepC, EtOH abuse, cirrhosis, CHF   Clinical Impression   This 72 yo male admitted with above presents to acute OT with PLOF of really struggling with ambulation and mobility since he went home from hospital a few days ago. He currently is setup/S-max A for basic ADLs and will continue to benefit from acute OT with follow up OT at SNF. Acute OT will continue to follow.     Recommendations for follow up therapy are one component of a multi-disciplinary discharge planning process, led by the attending physician.  Recommendations may be updated based on patient status, additional functional criteria and insurance authorization.   Follow Up Recommendations  Skilled nursing-short term rehab (<3 hours/day)     Assistance Recommended at Discharge Frequent or constant Supervision/Assistance  Patient can return home with the following A lot of help with walking and/or transfers;A lot of help with bathing/dressing/bathroom;Assistance with cooking/housework;Help with stairs or ramp for entrance;Assist for transportation    Functional Status Assessment  Patient has had a recent decline in their functional status and demonstrates the ability to make significant improvements in function in a reasonable and predictable amount of time.  Equipment Recommendations  Other (comment) (wide 3n1)       Precautions / Restrictions  Precautions Precautions: Fall Precaution Comments: R knee pain and weakness Restrictions Weight Bearing Restrictions: No      Mobility Bed Mobility Overal bed mobility: Needs Assistance Bed Mobility: Sit to Supine       Sit to supine: Min assist   General bed mobility comments: Needed A to get repositioned in bed from diagonal to centered    Transfers Overall transfer level: Needs assistance Equipment used: None Transfers: Sit to/from Stand Sit to Stand: Min assist           General transfer comment: mod A using rocking momentum      Balance Overall balance assessment: Needs assistance Sitting-balance support: No upper extremity supported, Feet supported Sitting balance-Leahy Scale: Good     Standing balance support: Single extremity supported Standing balance-Leahy Scale: Poor                             ADL either performed or assessed with clinical judgement   ADL Overall ADL's : Needs assistance/impaired Eating/Feeding: Independent;Sitting Eating/Feeding Details (indicate cue type and reason): EOB Grooming: Set up;Supervision/safety;Sitting Grooming Details (indicate cue type and reason): EOB Upper Body Bathing: Set up;Supervision/ safety;Sitting Upper Body Bathing Details (indicate cue type and reason): EOB Lower Body Bathing: Moderate assistance Lower Body Bathing Details (indicate cue type and reason): min A sit<>stand Upper Body Dressing : Set up;Supervision/safety;Sitting   Lower Body Dressing: Moderate assistance Lower Body Dressing Details (indicate cue type and reason): min A sit<>stand Toilet Transfer: Minimal assistance Toilet Transfer Details (indicate cue type and reason): side step up towards [place] and Hygiene: Maximal assistance Gas  Manipulation Details (indicate cue type and reason): min A sit<>stand             Vision Baseline Vision/History: 1 Wears glasses Ability to  See in Adequate Light: 0 Adequate Patient Visual Report: No change from baseline Vision Assessment?: Yes Eye Alignment: Within Functional Limits Ocular Range of Motion: Within Functional Limits Alignment/Gaze Preference: Within Defined Limits Tracking/Visual Pursuits: Able to track stimulus in all quads without difficulty Saccades: Within functional limits Convergence: Within functional limits Visual Fields: No apparent deficits            Pertinent Vitals/Pain Pain Assessment Pain Assessment: No/denies pain     Hand Dominance Right   Extremity/Trunk Assessment Upper Extremity Assessment Upper Extremity Assessment: Overall WFL for tasks assessed      Communication Communication Communication: No difficulties   Cognition Arousal/Alertness: Awake/alert Behavior During Therapy: WFL for tasks assessed/performed Overall Cognitive Status: Impaired/Different from baseline Area of Impairment: Following commands, Safety/judgement                       Following Commands: Follows one step commands consistently Safety/Judgement: Decreased awareness of safety     General Comments: He was able to recall that the PT had recommended inpatient rehab for him prior to returning home                [place] expects to be discharged to:: Private residence Living Arrangements: Non-relatives/Friends Available Help at Discharge: Friend(s);Available 24 hours/day Type of Home: Apartment Home Access: Stairs to enter Entrance Stairs-Number of Steps: flight (14) Entrance Stairs-Rails: Right Home Layout: One level     Bathroom Shower/Tub: Teacher, early years/pre: Standard     Home Equipment: Financial controller: Sock aid    Lives With: Friend(s)    Prior Functioning/Environment Prior Level of Function : Independent/Modified Independent;Driving prior to recent hospital admission and discharge a few days ago. Since then he  has not driven and needs increased A with basic and IADLs.                       OT Problem List: Impaired balance (sitting and/or standing);Obesity;Pain      OT Treatment/Interventions: Self-care/ADL training;DME and/or AE instruction;Patient/family education;Balance training    OT Goals(Current goals can be found in the care plan section) Acute Rehab OT Goals Patient Stated Goal: to go to rehab than home OT Goal Formulation: With patient/family Time For Goal Achievement: 05/31/22 Potential to Achieve Goals: Good  OT Frequency: Min 2X/week       AM-PAC OT "6 Clicks" Daily Activity     Outcome Measure Help from another person eating meals?: None Help from another person taking care of personal grooming?: A Little Help from another person toileting, which includes using toliet, bedpan, or urinal?: A Lot Help from another person bathing (including washing, rinsing, drying)?: A Lot Help from another person to put on and taking off regular upper body clothing?: A Little Help from another person to put on and taking off regular lower body clothing?: A Lot 6 Click Score: 16   End of Session    Activity Tolerance: Patient tolerated treatment well Patient left: in bed;with call bell/phone within reach;with family/visitor present (ED stretcher)  OT Visit Diagnosis: Unsteadiness on feet (R26.81);Other abnormalities of gait and mobility (R26.89);Other symptoms and signs involving cognitive function                Time: [id number] OT Time  Calculation (min): 24 min Charges:  OT General Charges $OT Visit: 1 Visit OT Evaluation $OT Eval Moderate Complexity: 1 Mod OT Treatments $Self Care/Home Management : 8-22 mins  [place] Office [phone]    Almon Register 05/17/2022, 4:31 PM

## 2022-05-17 NOTE — Progress Notes (Signed)
Physical Therapy Evaluation Patient Details Name: Austin Mercer. MRN: [id number] DOB: 1950-06-13 Today's Date: 05/17/2022  History of Present Illness  72 yo male with onset of increased RLE weakness and pain was admitted 2/13 after having falls, notable edema on RLE knee.  Pt has reported inability to walk, after sliding down on BR floor.  Was just seen for new stroke three days prior to admit and sent home. 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 PMHx:  obesity, DM, HTN, CVA, HepC, EtOH abuse, cirrhosis, CHF  Clinical Impression  Pt was seen for mobility on RW after initially attempting no AD, and continue to have challenges with pt having 15 steps to get into current living situation with friends.  Pt is hoping to get to a more comfortable place, as his current group is not doing things he finds acceptable.  Will recommend him to get to SNF for more intensive rehab given the depth of deficits that have just occurred and that hinder him from getting up a full stair flight.  Follow acutely for goals of PT as are outlined below.       Recommendations for follow up therapy are one component of a multi-disciplinary discharge planning process, led by the attending physician.  Recommendations may be updated based on patient status, additional functional criteria and insurance authorization.  Follow Up Recommendations Skilled nursing-short term rehab (<3 hours/day) Can patient physically be transported by private vehicle: No    Assistance Recommended at Discharge Frequent or constant Supervision/Assistance  Patient can return home with the following  A lot of help with walking and/or transfers;A little help with bathing/dressing/bathroom;Assistance with cooking/housework;Direct supervision/assist for medications management;Direct supervision/assist for financial management;Assist for transportation;Help with  stairs or ramp for entrance    Equipment Recommendations Rolling walker (2 wheels)  Recommendations for Other Services       Functional Status Assessment Patient has had a recent decline in their functional status and demonstrates the ability to make significant improvements in function in a reasonable and predictable amount of time.     Precautions / Restrictions Precautions Precautions: Fall Precaution Comments: R knee pain and weakness Restrictions Weight Bearing Restrictions: No      Mobility  Bed Mobility Overal bed mobility: Needs Assistance Bed Mobility: Supine to Sit, Sit to Supine     Supine to sit: Min assist Sit to supine: Min assist   General bed mobility comments: help to sit up on gurney and would benefit from a std hosp bed    Transfers Overall transfer level: Needs assistance Equipment used: Rolling walker (2 wheels), None Transfers: Sit to/from Stand Sit to Stand: Mod assist           General transfer comment: mod assist and momentum, but also hand placement cues    Ambulation/Gait Ambulation/Gait assistance: Mod assist, Min guard Gait Distance (Feet): 40 Feet (20 x 2) Assistive device: Rolling walker (2 wheels)   Gait velocity: decreased Gait velocity interpretation: <1.31 ft/sec, indicative of household ambulator Pre-gait activities: standing balance ck General Gait Details: pt is mod assist to support UE to walk unless using RW and is min guard to maneuver walker  Stairs            Wheelchair Mobility    Modified Rankin (Stroke Patients Only)       Balance Overall balance assessment: Needs assistance, History of Falls Sitting-balance support: Feet supported Sitting balance-Leahy Scale: Good  Standing balance support: Bilateral upper extremity supported, During functional activity Standing balance-Leahy Scale: Fair Standing balance comment: less than fair dynamically                             Pertinent  Vitals/Pain Pain Assessment Pain Assessment: Faces Faces Pain Scale: Hurts little more Pain Location: R knee Pain Descriptors / Indicators: Aching, Guarding Pain Intervention(s): Limited activity within patient's tolerance, Monitored during session, Repositioned    Home Living Family/patient expects to be discharged to:: Private residence Living Arrangements: Non-relatives/Friends Available Help at Discharge: Friend(s);Available 24 hours/day Type of Home: Apartment Home Access: Stairs to enter Entrance Stairs-Rails: Right Entrance Stairs-Number of Steps: flight   Home Layout: One level Home Equipment: None      Prior Function Prior Level of Function : Independent/Modified Independent;Driving             Mobility Comments: reports he is unable to walk but even previously used shopping cart in store and other non AD mechanical help to move       Hand Dominance   Dominant Hand: Right    Extremity/Trunk Assessment   Upper Extremity Assessment Upper Extremity Assessment: Overall WFL for tasks assessed    Lower Extremity Assessment Lower Extremity Assessment: Generalized weakness RLE Deficits / Details: R knee and ankle weak, but also B hip adduction    Cervical / Trunk Assessment Cervical / Trunk Assessment: Other exceptions Cervical / Trunk Exceptions: body habitus  Communication   Communication: No difficulties  Cognition Arousal/Alertness: Awake/alert Behavior During Therapy: WFL for tasks assessed/performed Overall Cognitive Status: History of cognitive impairments - at baseline Area of Impairment: Following commands, Safety/judgement, Awareness, Problem solving, Memory, Attention, Orientation                 Orientation Level: Situation Current Attention Level: Selective Memory: Decreased short-term memory Following Commands: Follows one step commands with increased time, Follows one step commands inconsistently Safety/Judgement: Decreased awareness  of safety, Decreased awareness of deficits Awareness: Anticipatory, Intellectual Problem Solving: Slow processing, Requires verbal cues, Requires tactile cues General Comments: Pt is unable to understand some basic conversation about getting help for home, requires repetition but not fully successful        General Comments General comments (skin integrity, edema, etc.): Pt is assisted to walk initially iwht no AD as he reports but cannot go without R knee buckling.  Next HHA with poor tolerance but on RW is min guard with minimal R knee issues    Exercises     Assessment/Plan    PT Assessment Patient needs continued PT services  PT Problem List Decreased strength;Decreased balance;Decreased cognition;Decreased coordination;Decreased knowledge of use of DME;Decreased safety awareness;Obesity;Decreased skin integrity;Pain       PT Treatment Interventions DME instruction;Gait training;Stair training;Functional mobility training;Therapeutic activities;Therapeutic exercise;Balance training;Neuromuscular re-education;Patient/family education    PT Goals (Current goals can be found in the Care Plan section)  Acute Rehab PT Goals Patient Stated Goal: get home and to a new location if possible PT Goal Formulation: With patient Time For Goal Achievement: 05/31/22 Potential to Achieve Goals: Good    Frequency Min 3X/week     Co-evaluation               AM-PAC PT "6 Clicks" Mobility  Outcome Measure Help needed turning from your back to your side while in a flat bed without using bedrails?: A Little Help needed moving from lying on your back to sitting  on the side of a flat bed without using bedrails?: A Little Help needed moving to and from a bed to a chair (including a wheelchair)?: A Little Help needed standing up from a chair using your arms (e.g., wheelchair or bedside chair)?: A Lot Help needed to walk in hospital room?: A Little Help needed climbing 3-5 steps with a  railing? : Total 6 Click Score: 15    End of Session Equipment Utilized During Treatment: Gait belt Activity Tolerance: Patient tolerated treatment well Patient left: in bed;with call bell/phone within reach Nurse Communication: Mobility status PT Visit Diagnosis: Pain;Difficulty in walking, not elsewhere classified (R26.2);History of falling (Z91.81) Pain - Right/Left: Right Pain - part of body: Knee    Time: [id number] PT Time Calculation (min) (ACUTE ONLY): 19 min   Charges:   PT Evaluation $PT Eval Moderate Complexity: 1 Mod         Ramond Dial 05/17/2022, 2:51 PM  Mee Hives, PT PhD Acute Rehab Dept. Number: [place] [facility] [place]

## 2022-05-17 NOTE — Consult Note (Signed)
NEUROLOGY CONSULTATION NOTE   Date of service: May 17, 2022 Patient Name: Austin Mercer. MRN:  [id number] DOB:  09-06-1950 Reason for consult: "falls since discharge from hospital after embolic strokes, MRI shows a new pontine stroke" Requesting Provider: Orpah Greek, * _ _ _   _ __   _ __ _ _  __ __   _ __   __ _  History of Present Illness  Austin Mercer. is a 72 y.o. male with PMH significant for obesity, HTN, DM, recent BL embolic strokes s/p tnkase and discharged home about 3 days ago who returns with difficulty ambulating and unable to stand up on his own today.  Reports that his R leg has been weak and bothersome since discharge on Saturday. He was in the bathroom and was halfway done wiping himself when he fell backwards on the toilet seat and then slid down onto the floor. He tried to prop himself up and stand up using the side of the bathtub but was unable to get  up so they had to call EMS.  He had MRI brain which demonstrated an acute left pontine stroke in addition to subacute BL occipital and left parietal lobe stroke.  He denies any other falls, reports that he slid down the bed twice but that was years ago.  LKW: unclear. mRS: 3 tNKASE: not offered, recent strokes. Thrombectomy: not offered, not consistent with LVO NIHSS components Score: Comment  1a Level of Conscious 0[x]$  1[]$  2[]$  3[]$      1b LOC Questions 0[x]$  1[]$  2[]$       1c LOC Commands 0[x]$  1[]$  2[]$       2 Best Gaze 0[x]$  1[]$  2[]$       3 Visual 0[x]$  1[]$  2[]$  3[]$      4 Facial Palsy 0[x]$  1[]$  2[]$  3[]$      5a Motor Arm - left 0[x]$  1[]$  2[]$  3[]$  4[]$  UN[]$    5b Motor Arm - Right 0[x]$  1[]$  2[]$  3[]$  4[]$  UN[]$    6a Motor Leg - Left 0[x]$  1[]$  2[]$  3[]$  4[]$  UN[]$    6b Motor Leg - Right 0[]$  1[x]$  2[]$  3[]$  4[]$  UN[]$    7 Limb Ataxia 0[x]$  1[]$  2[]$  3[]$  UN[]$     8 Sensory 0[x]$  1[]$  2[]$  UN[]$      9 Best Language 0[x]$  1[]$  2[]$  3[]$      10 Dysarthria 0[x]$  1[]$  2[]$  UN[]$      11 Extinct. and Inattention 0[x]$  1[]$  2[]$       TOTAL: 1       ROS   Constitutional Denies weight loss, fever and chills.   HEENT Denies changes in vision and hearing.   Respiratory Denies SOB and cough.   CV Denies palpitations and CP   GI Denies abdominal pain, nausea, vomiting and diarrhea.   GU Denies dysuria and urinary frequency.   MSK Denies myalgia and joint pain.   Skin Denies rash and pruritus.   Neurological Denies headache and syncope.   Psychiatric Denies recent changes in mood. Denies anxiety and depression.    Past History   Past Medical History:  Diagnosis Date   Diabetes ([place])    Hypertension    Hypertensive urgency 03/04/2020   Stroke [facility])    Past Surgical History:  Procedure Laterality Date   ANKLE FRACTURE SURGERY     Family History  Problem Relation Age of Onset   Diabetes Father    Social History   Socioeconomic History   Marital status: Divorced    Spouse name: Not on file  Number of children: 2   Years of education: Not on file   Highest education level: Not on file  Occupational History   Occupation: retired  Tobacco Use   Smoking status: Former    Types: Cigarettes    Quit date: 1973    Years since quitting: 51.1   Smokeless tobacco: Never  Vaping Use   Vaping Use: Never used  Substance and Sexual Activity   Alcohol use: Yes    Comment: Beer, Red wine   Drug use: Not Currently   Sexual activity: Not on file  Other Topics Concern   Not on file  Social History Narrative   Not on file   Social Determinants of Health   Financial Resource Strain: Not on file  Food Insecurity: Not on file  Transportation Needs: Not on file  Physical Activity: Not on file  Stress: Not on file  Social Connections: Not on file   Allergies  Allergen Reactions   Codeine Other (See Comments)   Latex Itching    Medications  (Not in a hospital admission)    Vitals   Vitals:   05/17/22 0300 05/17/22 0330 05/17/22 0400 05/17/22 0425  BP: (!) 138/96 138/75 126/71   Pulse: 61 (!) 59 65   Resp: 15 16  (!) 21   Temp:    98.7 F (37.1 C)  TempSrc:    Oral  SpO2: 98% 100% 100%      There is no height or weight on file to calculate BMI.  Physical Exam   General: Laying comfortably in bed; in no acute distress.  HENT: Normal oropharynx and mucosa. Normal external appearance of ears and nose.  Neck: Supple, no pain or tenderness  CV: No JVD. No peripheral edema.  Pulmonary: Symmetric Chest rise. Normal respiratory effort.  Abdomen: Soft to touch, non-tender.  Ext: No cyanosis, edema, or deformity  Skin: No rash. Normal palpation of skin.   Musculoskeletal: Normal digits and nails by inspection. No clubbing.   Neurologic Examination  Mental status/Cognition: Alert, oriented to self, place, month and year, good attention.  Speech/language: Fluent, comprehension intact, object naming intact, repetition intact.  Cranial nerves:   CN II Pupils equal and reactive to light, no VF deficits    CN III,IV,VI EOM intact, no gaze preference or deviation, no nystagmus    CN V normal sensation in V1, V2, and V3 segments bilaterally    CN VII no asymmetry, no nasolabial fold flattening    CN VIII normal hearing to speech    CN IX & X normal palatal elevation, no uvular deviation    CN XI 5/5 head turn and 5/5 shoulder shrug bilaterally    CN XII midline tongue protrusion    Motor:  Muscle bulk: normal, tone normal, pronator drift none tremor none Mvmt Root Nerve  Muscle Right Left Comments  SA C5/6 Ax Deltoid 5 5   EF C5/6 Mc Biceps 5 5   EE C6/7/8 Rad Triceps 5 5   WF C6/7 Med FCR     WE C7/8 PIN ECU     F Ab C8/T1 U ADM/FDI 5 5   HF L1/2/3 Fem Illopsoas 4 4+   KE L2/3/4 Fem Quad 4 4+   DF L4/5 D Peron Tib Ant 4 5   PF S1/2 Tibial Grc/Sol 4 5    Sensation:  Light touch Intact throughout   Pin prick    Temperature    Vibration   Proprioception    Coordination/Complex Motor:  -  Finger to Nose intact BL - Heel to shin unable to do 2/2 body habitus. - Rapid alternating movement  are slowed on the right. - Gait: deferred for patient safety specially given falls.  Labs   CBC:  Recent Labs  Lab 05/11/22 0849 05/11/22 0858 05/11/22 1119 05/16/22 1547  WBC 5.0  --   --  7.3  NEUTROABS 1.6*  --   --  3.2  HGB 13.2   < > 13.3 13.6  HCT 39.8   < > 39.0 41.7  MCV 80.7  --   --  82.7  PLT 136*  --   --  141*   < > = values in this interval not displayed.    Basic Metabolic Panel:  Lab Results  Component Value Date   NA 133 (L) 05/16/2022   K 4.7 05/16/2022   CO2 21 (L) 05/16/2022   GLUCOSE 144 (H) 05/16/2022   BUN 19 05/16/2022   CREATININE 1.64 (H) 05/16/2022   CALCIUM 10.6 (H) 05/16/2022   GFRNONAA 45 (L) 05/16/2022   GFRAA 53 (L) 03/04/2020   Lipid Panel:  Lab Results  Component Value Date   LDLCALC 56 05/12/2022   HgbA1c:  Lab Results  Component Value Date   HGBA1C 6.4 (H) 01/04/2022   Urine Drug Screen:     Component Value Date/Time   LABOPIA NONE DETECTED 05/11/2022 0945   COCAINSCRNUR NONE DETECTED 05/11/2022 0945   LABBENZ NONE DETECTED 05/11/2022 0945   AMPHETMU NONE DETECTED 05/11/2022 0945   THCU NONE DETECTED 05/11/2022 0945   LABBARB NONE DETECTED 05/11/2022 0945    Alcohol Level     Component Value Date/Time   ETH <10 05/11/2022 0849   MRI Brain(Personally reviewed): 1. 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 2. Redemonstrated subacute infarcts in the bilateral occipital lobes and left parietal lobe.  Impression   Austin Mercer. is a 71 y.o. male with PMH significant for with PMH significant for elevated BMI, HTN, DM, recent BL embolic strokes s/p tnkase and discharged home about 3 days ago who returns with difficulty ambulating and unable to stand up on his own today. His neurologic examination is notable for mild RLE weakness.  He was found to have a new Left pontine stroke on MRI brain in addition to the known subacute strokes. Likely embolic. His fall in the bathroom with inability to get up on his own is likely  related to the RLE weakness and body habitus.  Recommendations  - Pending loop recorder placement outpatient. - CT chest, abd, pelvis for malignancy screen. - PT and OT evaluation. ______________________________________________________________________   Thank you for the opportunity to take part in the care of this patient. If you have any further questions, please contact the neurology consultation attending.  Signed,  [place] Pager Number [phone] _ _ _   _ __   _ __ _ _  __ __   _ __   __ _

## 2022-05-17 NOTE — Progress Notes (Addendum)
STROKE TEAM PROGRESS NOTE   INTERVAL HISTORY He has no family at the bedside.  He was discharged 4 days ago after what appeared to be an embolic stroke.  He presented again with difficulty walking.  His family states that he was not able to do the steps to their apartment and his family has had to call the fire department to help him after a fall since he had been discharged.  Initially there were plans for him to see cardiology outpatient to get a loop recorder placed, however we may consider doing this and patient now that he has returned to the hospital.  MRI shows a new acute left pontine infarct that is more consistent with small vessel disease.  He does have uncontrolled risk factors as well.  He received TNK during his last stroke on 2/8.  Vitals:   05/17/22 0700 05/17/22 0740 05/17/22 0800 05/17/22 0830  BP:  139/69 (!) 152/65 (!) 154/79  Pulse: (!) 57 (!) 57 (!) 58 (!) 57  Resp: 17 18 18 18  $ Temp:      TempSrc:      SpO2: 99% 99% 98% 97%   CBC:  Recent Labs  Lab 05/11/22 0849 05/11/22 0858 05/11/22 1119 05/16/22 1547  WBC 5.0  --   --  7.3  NEUTROABS 1.6*  --   --  3.2  HGB 13.2   < > 13.3 13.6  HCT 39.8   < > 39.0 41.7  MCV 80.7  --   --  82.7  PLT 136*  --   --  141*   < > = values in this interval not displayed.   Basic Metabolic Panel:  Recent Labs  Lab 05/13/22 0304 05/16/22 1547  NA 131* 133*  K 4.0 4.7  CL 102 104  CO2 20* 21*  GLUCOSE 127* 144*  BUN 19 19  CREATININE 1.32* 1.64*  CALCIUM 10.4* 10.6*  MG  --  1.9   Lipid Panel:  Recent Labs  Lab 05/12/22 0338  CHOL 112  TRIG 93  HDL 37*  CHOLHDL 3.0  VLDL 19  LDLCALC 56   HgbA1c: No results for input(s): "HGBA1C" in the last 168 hours. Urine Drug Screen:  Recent Labs  Lab 05/11/22 0945  LABOPIA NONE DETECTED  COCAINSCRNUR NONE DETECTED  LABBENZ NONE DETECTED  AMPHETMU NONE DETECTED  THCU NONE DETECTED  LABBARB NONE DETECTED    Alcohol Level  Recent Labs  Lab 05/11/22 0849  ETH <10     IMAGING past 24 hours MR BRAIN WO CONTRAST  Result Date: 05/17/2022 CLINICAL DATA:  Stroke suspected, increased bilateral leg weakness EXAM: MRI HEAD WITHOUT CONTRAST TECHNIQUE: Multiplanar, multiecho pulse sequences of the brain and surrounding structures were obtained without intravenous contrast. COMPARISON:  05/13/2021 FINDINGS: Brain: New area of restricted diffusion with ADC correlate in the left pons (series 2, images 15-17 and series 3, image 17), consistent with acute infarct. Other foci of mildly increased signal on diffusion-weighted imaging bilateral occipital lobes and left parietal lobe were present on the prior exam, likely subacute infarcts. No acute hemorrhage, mass, mass effect, or midline shift. No hydrocephalus or extra-axial collection. T2 hyperintense signal in the periventricular white matter, likely the sequela of moderate chronic small vessel ischemic disease. Remote lacunar infarcts in the right greater than left basal ganglia. Vascular: Normal arterial flow voids. Skull and upper cervical spine: Normal marrow signal. Sinuses/Orbits: No acute finding. Other: Trace fluid in right mastoid air cells. IMPRESSION: 1. New acute infarct in  the left pons. 2. Redemonstrated subacute infarcts in the bilateral occipital lobes and left parietal lobe. These results were called by telephone at the time of interpretation on 05/16/2022 at 11:59 pm to provider POLLINA, who verbally acknowledged these results. Electronically Signed   By: Merilyn Baba M.D.   On: 05/17/2022 00:02   CT Head Wo Contrast  Result Date: 05/16/2022 CLINICAL DATA:  Recent stroke with increased bilateral leg weakness unable to stand EXAM: CT HEAD WITHOUT CONTRAST TECHNIQUE: Contiguous axial images were obtained from the base of the skull through the vertex without intravenous contrast. RADIATION DOSE REDUCTION: This exam was performed according to the departmental dose-optimization program which includes automated  exposure control, adjustment of the mA and/or kV according to patient size and/or use of iterative reconstruction technique. COMPARISON:  CT 05/12/2022, MRI 05/13/2022 FINDINGS: Brain: No acute territorial infarction, hemorrhage, or intracranial mass. Atrophy and chronic small vessel ischemic changes of the white matter. 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 Recently demonstrated small parietooccipital cortical infarcts on MRI are not well seen by CT. The ventricles are stable in size. Vascular: No hyperdense vessels.  Carotid vascular calcification Skull: Normal. Negative for fracture or focal lesion. Sinuses/Orbits: No acute finding. Other: None IMPRESSION: 1. No CT evidence for acute intracranial abnormality. 2. Atrophy and chronic small vessel ischemic changes of the white matter. 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 Recently demonstrated small cortical infarcts on MRI are not well seen by CT Electronically Signed   By: Donavan Foil M.D.   On: 05/16/2022 17:30   DG Chest 1 View  Result Date: 05/16/2022 CLINICAL DATA:  Pt having leg and arm weakness, feeling overall weak since yesterday - hx of stroke, htn, diabetes EXAM: CHEST  1 VIEW COMPARISON:  03/06/2020 FINDINGS: Lungs are clear. Heart size and mediastinal contours are within normal limits. Aortic Atherosclerosis (ICD10-170.0). No effusion. Visualized bones unremarkable. IMPRESSION: No acute cardiopulmonary disease. Electronically Signed   By: Lucrezia Europe M.D.   On: 05/16/2022 16:12    PHYSICAL EXAM Constitutional: Appears obese middle-age African-American male.   Cardiovascular: Normal rate and regular rhythm.  Respiratory: Effort normal, non-labored breathing   Neuro: Mental Status: Patient is awake, alert, oriented to person, place, month, year, and situation. Patient is able to give a clear and coherent history. No signs of aphasia or neglect Cranial  Nerves: II: Visual Fields are full. Pupils are equal, round, and reactive to light.   III,IV, VI: EOMI without ptosis or diploplia.  V: Facial sensation is symmetric to temperature VII: Facial movement is symmetric resting and smiling VIII: Hearing is intact to voice X: Palate elevates symmetrically XI: Shoulder shrug is symmetric. XII: Tongue protrudes midline without atrophy or fasciculations.  Motor: Tone is normal. Bulk is normal. 5/5 strength was present in bilateral upper extremities 4/5 strength in bilateral lower extremities.  Mild right leg drift Sensory: Sensation is symmetric to light touch in the arms and legs.  Cerebellar: FNF and HKS are intact bilaterally Gait deferred for safety  NIHSS 1.  Premorbid modified Rankin 2 ASSESSMENT/PLAN Mr. Austin Mercer. is a 72 y.o. male with history of elevated BMI, HTN, DM, recent BL embolic strokes s/p tnkase and discharged home about 3 days ago who returns with difficulty ambulating and unable to stand up on his own today. His neurologic examination is notable for mild RLE weakness.   Stroke:  Acute infarct in the left pons Etiology: More consistent with  small vessel disease Code Stroke CT head No acute abnormality. ASPECTS 10.    CTA head & neck - advanced and widespread intracranial atherosclerosis: highly stenotic Right fetal PCA. moderate tandem stenoses of the contralateral Left PCA. mild bilateral ACA A1 stenosis, and moderate to severe stenosis. Left ACA A2 and right A3. up to moderate Right MCA M3/4 branch stenoses. mild stenoses of the Basilar artery, ICA siphons. MRI 2/8 No acute intracranial abnormality identified. 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 Repeat Head CT- No acute intracranial abnormality MRI 2/10 - Small acute bilateral parieto-occipital cortical infarcts.  MRI Brain 2/10- Left pontine infarct  2D Echo- EF 55-60% LE venous doppler  no DVT EEG- This study is suggestive of moderate diffuse encephalopathy, nonspecific etiology. No seizures LDL 56 HgbA1c 6.4 UDS neg VTE prophylaxis - heparin No antithrombotic prior to admission, now on aspirin 81 mg and plavix 75 DAPT daily for 3 months and then ASA alone. Therapy recommendations: Pending Disposition: Pending  CHF Reserved EF 55-60% now On home coreg, lasix, spironolactone, cozaar and faxiga  Followed up in [place] in the past   Hypertension Home meds:  norvasc, coreg, losartan, lasix, spironolactone,  Stable Long-term BP goal normotensive   Hyperlipidemia Home meds:  lipitor 29m, resumed in hospital LDL 56, goal < 70 No high intensity given LDL at goal Continue statin at discharge  Other Stroke Risk Factors Advanced Age >/= 619 Obesity, Body mass index is 47.5 kg/m., BMI >/= 30 associated with increased stroke risk, recommend weight loss, diet and exercise as appropriate  At risk for sleep apnea-patient considering participation in the sleep smart study Previous stroke  2/8- Bilateral parietal occipital embolic infarcts s/p TNK, likely cardio embolic, source unclear   Hospital day # 0  Patient seen and examined by NP/APP with MD. MD to update note as needed.   DJanine Ores DNP, FNP-BC [facility] Pager: [phone] STROKE MD NOTE :  I have personally obtained history,examined this patient, reviewed notes, independently viewed imaging studies, participated in medical decision making and plan of care.ROS completed by me personally and pertinent positives fully documented  I have made any additions or clarifications directly to the above note. Agree with note above.  Patient with recent bilateral parietal occipital embolic infarcts of cryptogenic etiology who was treated with IV TNK and made good recovery.  He presented with difficulty walking and fall and MRI shows a new left pontine infarct likely from small vessel disease.  He also has  multifocal intracranial occlusive disease.  Recommend aspirin and Plavix for 3 months followed by aspirin alone and aggressive risk factor modification.  No need for repeating stroke workup as he had a complete workup recently.  He may need loop recorder at discharge to look for paroxysmal A-fib.  Physical occupational therapy consults.  Patient also appears to be at risk for sleep apnea and may benefit with consideration for participation with sleep smart stroke prevention study.  He was given written information to review and decide.  Greater than 50% time during this 50-minute visit was spent in counseling and coordination of care about his stroke and discussion about sleep apnea and stroke prevention and answering questions.  PAntony Contras MD Medical Director [facility] Pager: [phone]/14/2024 2:32 PM  To contact Stroke Continuity provider, please refer to [email or web address] After hours, contact General Neurology

## 2022-05-17 NOTE — ED Notes (Signed)
ED TO INPATIENT HANDOFF REPORT  ED Nurse Name and Phone #: Jeannie Done [phone]   S Name/Age/Gender Austin Mercer. 72 y.o. male Room/Bed: 044C/044C  Code Status   Code Status: Full Code  Home/SNF/Other Plan to d/c to rehab or SNF due to concerns for limitations at home Patient oriented to: self, place, time, and situation Is this baseline? Yes   Triage Complete: Triage complete  Chief Complaint CVA (cerebral vascular accident) [facility]) [I63.9]  Triage Note Pt bib ems from home; discharged Saturday after having stroke; increased bilateral leg weakness today, unable to stand; slid from toilet to ground; did not hit head, no loc; equal strength bilaterally,no other deficits ; 160/90, P 64, 97% ra, CBG 132   Allergies Allergies  Allergen Reactions   Codeine Itching   Latex Itching    Level of Care/Admitting Diagnosis ED Disposition     ED Disposition  Admit   Condition  --   Comment  Hospital Area: [place] [[id number]]  Level of Care: Telemetry Medical [104]  May admit patient to [facility] or [facility] if equivalent level of care is available:: Yes  Covid Evaluation: Asymptomatic - no recent exposure (last 10 days) testing not required  Diagnosis: CVA (cerebral vascular accident) [facility]) [id number]  Admitting Physician: Guilford Shi [id number]  Attending Physician: Guilford Shi [id number]  Certification:: I certify this patient will need inpatient services for at least 2 midnights  Estimated Length of Stay: 3          B Medical/Surgery History Past Medical History:  Diagnosis Date   Diabetes ([place])    Hypertension    Hypertensive urgency 03/04/2020   Stroke [facility])    Past Surgical History:  Procedure Laterality Date   ANKLE FRACTURE SURGERY       A IV Location/Drains/Wounds Patient Lines/Drains/Airways Status     Active Line/Drains/Airways     Name Placement date Placement time Site Days   Peripheral IV 05/17/22 20 G  Right;Anterior Antecubital 05/17/22  0921  Antecubital  less than 1            Intake/Output Last 24 hours  Intake/Output Summary (Last 24 hours) at 05/17/2022 1348 Last data filed at 05/17/2022 1106 Gross per 24 hour  Intake --  Output 400 ml  Net -400 ml    Labs/Imaging Results for orders placed or performed during the hospital encounter of 05/16/22 (from the past 48 hour(s))  Comprehensive metabolic panel     Status: Abnormal   Collection Time: 05/16/22  3:47 PM  Result Value Ref Range   Sodium 133 (L) 135 - 145 mmol/L   Potassium 4.7 3.5 - 5.1 mmol/L   Chloride 104 98 - 111 mmol/L   CO2 21 (L) 22 - 32 mmol/L   Glucose, Bld 144 (H) 70 - 99 mg/dL    Comment: Glucose reference range applies only to samples taken after fasting for at least 8 hours.   BUN 19 8 - 23 mg/dL   Creatinine, Ser 1.64 (H) 0.61 - 1.24 mg/dL   Calcium 10.6 (H) 8.9 - 10.3 mg/dL   Total Protein 7.6 6.5 - 8.1 g/dL   Albumin 3.9 3.5 - 5.0 g/dL   AST 25 15 - 41 U/L   ALT 22 0 - 44 U/L   Alkaline Phosphatase 50 38 - 126 U/L   Total Bilirubin 0.3 0.3 - 1.2 mg/dL   GFR, Estimated 45 (L) >60 mL/min    Comment: (NOTE) Calculated using the CKD-EPI Creatinine Equation (2021)  Anion gap 8 5 - 15    Comment: Performed at [facility] [address], [place], [place] [address]  CBC with Differential     Status: Abnormal   Collection Time: 05/16/22  3:47 PM  Result Value Ref Range   WBC 7.3 4.0 - 10.5 K/uL   RBC 5.04 4.22 - 5.81 MIL/uL   Hemoglobin 13.6 13.0 - 17.0 g/dL   HCT 41.7 39.0 - 52.0 %   MCV 82.7 80.0 - 100.0 fL   MCH 27.0 26.0 - 34.0 pg   MCHC 32.6 30.0 - 36.0 g/dL   RDW 16.4 (H) 11.5 - 15.5 %   Platelets 141 (L) 150 - 400 K/uL    Comment: REPEATED TO VERIFY   nRBC 0.0 0.0 - 0.2 %   Neutrophils Relative % 44 %   Neutro Abs 3.2 1.7 - 7.7 K/uL   Lymphocytes Relative 42 %   Lymphs Abs 3.0 0.7 - 4.0 K/uL   Monocytes Relative 11 %   Monocytes Absolute 0.8 0.1 - 1.0 K/uL   Eosinophils  Relative 2 %   Eosinophils Absolute 0.2 0.0 - 0.5 K/uL   Basophils Relative 1 %   Basophils Absolute 0.1 0.0 - 0.1 K/uL   Immature Granulocytes 0 %   Abs Immature Granulocytes 0.02 0.00 - 0.07 K/uL    Comment: Performed at [facility] [address], [place], [place] [address]  Magnesium     Status: None   Collection Time: 05/16/22  3:47 PM  Result Value Ref Range   Magnesium 1.9 1.7 - 2.4 mg/dL    Comment: Performed at [facility], [place] [address], [place], [place] [address]  Urinalysis, Routine w reflex microscopic -Urine, Clean Catch     Status: Abnormal   Collection Time: 05/16/22  4:36 PM  Result Value Ref Range   Color, Urine YELLOW YELLOW   APPearance CLEAR CLEAR   Specific Gravity, Urine 1.014 1.005 - 1.030   pH 5.0 5.0 - 8.0   Glucose, UA >=500 (A) NEGATIVE mg/dL   Hgb urine dipstick NEGATIVE NEGATIVE   Bilirubin Urine NEGATIVE NEGATIVE   Ketones, ur NEGATIVE NEGATIVE mg/dL   Protein, ur 100 (A) NEGATIVE mg/dL   Nitrite NEGATIVE NEGATIVE   Leukocytes,Ua NEGATIVE NEGATIVE   RBC / HPF 0-5 0 - 5 RBC/hpf   WBC, UA 0-5 0 - 5 WBC/hpf   Bacteria, UA NONE SEEN NONE SEEN   Squamous Epithelial / HPF 0-5 0 - 5 /HPF    Comment: Performed at [facility], [place] [address] [place], [place] [address]  CBG monitoring, ED     Status: Abnormal   Collection Time: 05/17/22 12:40 PM  Result Value Ref Range   Glucose-Capillary 183 (H) 70 - 99 mg/dL    Comment: Glucose reference range applies only to samples taken after fasting for at least 8 hours.   Comment 1 Document in Chart    CT CHEST ABDOMEN PELVIS W CONTRAST  Result Date: 05/17/2022 CLINICAL DATA:  Assessment for occult malignancy. History of diabetes and hypertension. EXAM: CT CHEST, ABDOMEN, AND PELVIS WITH CONTRAST TECHNIQUE: Multidetector CT imaging of the chest, abdomen and pelvis was performed following the standard protocol during bolus administration of intravenous contrast. RADIATION DOSE REDUCTION: This  exam was performed according to the departmental dose-optimization program which includes automated exposure control, adjustment of the mA and/or kV according to patient size and/or use of iterative reconstruction technique. CONTRAST:  138m OMNIPAQUE IOHEXOL 350  MG/ML SOLN COMPARISON:  Abdominopelvic CT 01/12/2022. Chest radiographs 05/16/2022. FINDINGS: CT CHEST FINDINGS Cardiovascular: No acute vascular findings. Mild atherosclerosis of the aorta, great vessels and coronary arteries. There is a small anterior pericardial effusion versus pericardial thickening. The heart size is normal. Mediastinum/Nodes: There are no enlarged mediastinal, hilar or axillary lymph nodes. Small mediastinal and axillary lymph nodes are noted bilaterally. The thyroid gland, trachea and esophagus demonstrate no significant findings. Lungs/Pleura: No pleural effusion or pneumothorax. No suspicious pulmonary nodules. Musculoskeletal/Chest wall: No chest wall mass or suspicious osseous findings. Mild spondylosis. CT ABDOMEN AND PELVIS FINDINGS Hepatobiliary: The liver is normal in density without suspicious focal abnormality. No evidence of gallstones, gallbladder wall thickening or biliary dilatation. Pancreas: Unremarkable. No pancreatic ductal dilatation or surrounding inflammatory changes. Spleen: Normal in size without focal abnormality. Adrenals/Urinary Tract: Both adrenal glands appear normal. No evidence of urinary tract calculus, suspicious renal lesion or hydronephrosis. The bladder appears normal for its degree of distention. Stomach/Bowel: No enteric contrast administered. The stomach appears unremarkable for its degree of distension. No evidence of bowel wall thickening, distention or surrounding inflammatory change. The appendix appears normal. Vascular/Lymphatic: There are no enlarged abdominal or pelvic lymph nodes. Aortic and branch vessel atherosclerosis without evidence of aneurysm or large vessel occlusion.  Reproductive: The prostate gland and seminal vesicles appear unremarkable. Other: No evidence of abdominal wall mass or hernia. No ascites. Musculoskeletal: No acute osseous findings. Multilevel lumbar spondylosis with chronic severe foraminal narrowing bilaterally at L5-S1. Probable central canal stenosis in the lower lumbar spine. IMPRESSION: 1. No acute findings or evidence of malignancy within the chest, abdomen or pelvis. 2. Small anterior pericardial effusion versus pericardial thickening. 3. Multilevel lumbar spondylosis with chronic severe foraminal narrowing bilaterally at L5-S1. 4.  Aortic Atherosclerosis (ICD10-I70.0). Electronically Signed   By: Richardean Sale M.D.   On: 05/17/2022 10:38   MR BRAIN WO CONTRAST  Result Date: 05/17/2022 CLINICAL DATA:  Stroke suspected, increased bilateral leg weakness EXAM: MRI HEAD WITHOUT CONTRAST TECHNIQUE: Multiplanar, multiecho pulse sequences of the brain and surrounding structures were obtained without intravenous contrast. COMPARISON:  05/13/2021 FINDINGS: Brain: New area of restricted diffusion with ADC correlate in the left pons (series 2, images 15-17 and series 3, image 17), consistent with acute infarct. Other foci of mildly increased signal on diffusion-weighted imaging bilateral occipital lobes and left parietal lobe were present on the prior exam, likely subacute infarcts. No acute hemorrhage, mass, mass effect, or midline shift. No hydrocephalus or extra-axial collection. T2 hyperintense signal in the periventricular white matter, likely the sequela of moderate chronic small vessel ischemic disease. Remote lacunar infarcts in the right greater than left basal ganglia. Vascular: Normal arterial flow voids. Skull and upper cervical spine: Normal marrow signal. Sinuses/Orbits: No acute finding. Other: Trace fluid in right mastoid air cells. IMPRESSION: 1. 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 2. Redemonstrated subacute infarcts in the bilateral occipital  lobes and left parietal lobe. These results were called by telephone at the time of interpretation on 05/16/2022 at 11:59 pm to provider POLLINA, who verbally acknowledged these results. Electronically Signed   By: Merilyn Baba M.D.   On: 05/17/2022 00:02   CT Head Wo Contrast  Result Date: 05/16/2022 CLINICAL DATA:  Recent stroke with increased bilateral leg weakness unable to stand EXAM: CT HEAD WITHOUT CONTRAST TECHNIQUE: Contiguous axial images were obtained from the base of the skull through the vertex without intravenous contrast. RADIATION DOSE REDUCTION: This exam was performed according to the departmental dose-optimization  program which includes automated exposure control, adjustment of the mA and/or kV according to patient size and/or use of iterative reconstruction technique. COMPARISON:  CT 05/12/2022, MRI 05/13/2022 FINDINGS: Brain: No acute territorial infarction, hemorrhage, or intracranial mass. Atrophy and chronic small vessel ischemic changes of the white matter. 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 Recently demonstrated small parietooccipital cortical infarcts on MRI are not well seen by CT. The ventricles are stable in size. Vascular: No hyperdense vessels.  Carotid vascular calcification Skull: Normal. Negative for fracture or focal lesion. Sinuses/Orbits: No acute finding. Other: None IMPRESSION: 1. No CT evidence for acute intracranial abnormality. 2. Atrophy and chronic small vessel ischemic changes of the white matter. 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 Recently demonstrated small cortical infarcts on MRI are not well seen by CT Electronically Signed   By: Donavan Foil M.D.   On: 05/16/2022 17:30   DG Chest 1 View  Result Date: 05/16/2022 CLINICAL DATA:  Pt having leg and arm weakness, feeling overall weak since yesterday - hx of stroke, htn, diabetes EXAM: CHEST  1 VIEW COMPARISON:  03/06/2020  FINDINGS: Lungs are clear. Heart size and mediastinal contours are within normal limits. Aortic Atherosclerosis (ICD10-170.0). No effusion. Visualized bones unremarkable. IMPRESSION: No acute cardiopulmonary disease. Electronically Signed   By: Lucrezia Europe M.D.   On: 05/16/2022 16:12    Pending Labs Unresulted Labs (From admission, onward)     Start     Ordered   05/24/22 0500  Creatinine, serum  (enoxaparin (LOVENOX)    CrCl >/= 30 ml/min)  Weekly,   R     Comments: while on enoxaparin therapy    05/17/22 0949   05/18/22 0500  Lipid panel  (Labs)  Tomorrow morning,   R       Comments: Fasting    05/17/22 0949   05/17/22 1100  Protein C, total  Once,   R        05/17/22 1059   05/17/22 1013  Factor 10 assay  Once,   R        05/17/22 1012   05/17/22 1013  Homocysteine  Once,   R        05/17/22 1012   05/17/22 1012  Factor 2 assay  Once,   R        05/17/22 1012   05/17/22 1012  Antiphospholipid syndrome eval, bld  Once,   R        05/17/22 1012            Vitals/Pain Today's Vitals   05/17/22 0900 05/17/22 0900 05/17/22 1030 05/17/22 1301  BP: (!) 162/94  (!) 148/74   Pulse: 60  61   Resp: 15  18   Temp:  (!) 97.3 F (36.3 C)  98.1 F (36.7 C)  TempSrc:  Oral  Oral  SpO2: 98%  100%   PainSc:        Isolation Precautions No active isolations  Medications Medications  aspirin EC tablet 81 mg (81 mg Oral Given 05/17/22 1108)  amLODipine (NORVASC) tablet 10 mg (10 mg Oral Given 05/17/22 1108)  atorvastatin (LIPITOR) tablet 20 mg (20 mg Oral Given 05/17/22 1108)  carvedilol (COREG) tablet 12.5 mg (has no administration in time range)  spironolactone (ALDACTONE) tablet 25 mg (25 mg Oral Given 05/17/22 1109)  dapagliflozin propanediol (FARXIGA) tablet 10 mg (has no administration in time range)  famotidine (PEPCID) tablet 20 mg (20 mg  Oral Given 05/17/22 1108)  clopidogrel (PLAVIX) tablet 75 mg (75 mg Oral Given 05/17/22 1109)   stroke: early stages of recovery book (has  no administration in time range)  acetaminophen (TYLENOL) tablet 650 mg (has no administration in time range)    Or  acetaminophen (TYLENOL) 160 MG/5ML solution 650 mg (has no administration in time range)    Or  acetaminophen (TYLENOL) suppository 650 mg (has no administration in time range)  enoxaparin (LOVENOX) injection 40 mg (40 mg Subcutaneous Given 05/17/22 1107)  insulin aspart (novoLOG) injection 0-15 Units (3 Units Subcutaneous Given 05/17/22 1256)  furosemide (LASIX) injection 20 mg (20 mg Intravenous Given 05/17/22 1258)  iohexol (OMNIPAQUE) 350 MG/ML injection 100 mL (100 mLs Intravenous Contrast Given 05/17/22 1013)    Mobility walks with device     Focused Assessments Neuro Assessment Handoff:  Swallow screen pass? Yes    NIH Stroke Scale  Dizziness Present: No Headache Present: No Interval: Shift assessment Level of Consciousness (1a.)   : Alert, keenly responsive LOC Questions (1b. )   : Answers both questions correctly LOC Commands (1c. )   : Performs both tasks correctly Best Gaze (2. )  : Normal Visual (3. )  : No visual loss Facial Palsy (4. )    : Normal symmetrical movements Motor Arm, Left (5a. )   : No drift Motor Arm, Right (5b. ) : No drift Motor Leg, Left (6a. )  : Drift Motor Leg, Right (6b. ) : Drift Limb Ataxia (7. ): Absent Sensory (8. )  : Normal, no sensory loss Best Language (9. )  : No aphasia Dysarthria (10. ): Mild-to-moderate dysarthria, patient slurs at least some words and, at worst, can be understood with some difficulty Extinction/Inattention (11.)   : No Abnormality Complete NIHSS TOTAL: 3     Neuro Assessment:   Neuro Checks:   Shift assessment (05/17/22 1006)  Has TPA been given? No If patient is a Neuro Trauma and patient is going to OR before floor call report to [place] nurse: [phone] or [phone]   R Recommendations: See Admitting Provider Note  Report given to:   Additional Notes: a/ox4, uses  urinal

## 2022-05-18 ENCOUNTER — Inpatient Hospital Stay: Payer: Self-pay | Admitting: Nurse Practitioner

## 2022-05-18 DIAGNOSIS — I10 Essential (primary) hypertension: Secondary | ICD-10-CM | POA: Diagnosis not present

## 2022-05-18 DIAGNOSIS — I639 Cerebral infarction, unspecified: Secondary | ICD-10-CM | POA: Diagnosis not present

## 2022-05-18 DIAGNOSIS — E785 Hyperlipidemia, unspecified: Secondary | ICD-10-CM | POA: Diagnosis not present

## 2022-05-18 LAB — GLUCOSE, CAPILLARY
Glucose-Capillary: 155 mg/dL — ABNORMAL HIGH (ref 70–99)
Glucose-Capillary: 182 mg/dL — ABNORMAL HIGH (ref 70–99)
Glucose-Capillary: 176 mg/dL — ABNORMAL HIGH (ref 70–99)
Glucose-Capillary: 167 mg/dL — ABNORMAL HIGH (ref 70–99)

## 2022-05-18 LAB — CBC
HCT: 40.3 % (ref 39.0–52.0)
Hemoglobin: 13.3 g/dL (ref 13.0–17.0)
MCH: 26.5 pg (ref 26.0–34.0)
MCHC: 33 g/dL (ref 30.0–36.0)
MCV: 80.4 fL (ref 80.0–100.0)
Platelets: 135 10*3/uL — ABNORMAL LOW (ref 150–400)
RBC: 5.01 MIL/uL (ref 4.22–5.81)
RDW: 16.1 % — ABNORMAL HIGH (ref 11.5–15.5)
WBC: 6 10*3/uL (ref 4.0–10.5)
nRBC: 0 % (ref 0.0–0.2)

## 2022-05-18 LAB — HOMOCYSTEINE: Homocysteine: 13.9 umol/L (ref 0.0–17.2)

## 2022-05-18 LAB — LIPID PANEL
Cholesterol: 109 mg/dL (ref 0–200)
HDL: 29 mg/dL — ABNORMAL LOW (ref >40)
LDL Cholesterol: 59 mg/dL (ref 0–99)
Total CHOL/HDL Ratio: 3.8 RATIO
Triglycerides: 106 mg/dL (ref <150)
VLDL: 21 mg/dL (ref 0–40)

## 2022-05-18 LAB — BASIC METABOLIC PANEL
Anion gap: 6 (ref 5–15)
BUN: 21 mg/dL (ref 8–23)
CO2: 23 mmol/L (ref 22–32)
Calcium: 10.7 mg/dL — ABNORMAL HIGH (ref 8.9–10.3)
Chloride: 103 mmol/L (ref 98–111)
Creatinine, Ser: 1.55 mg/dL — ABNORMAL HIGH (ref 0.61–1.24)
GFR, Estimated: 48 mL/min — ABNORMAL LOW (ref >60)
Glucose, Bld: 141 mg/dL — ABNORMAL HIGH (ref 70–99)
Potassium: 4.4 mmol/L (ref 3.5–5.1)
Sodium: 132 mmol/L — ABNORMAL LOW (ref 135–145)

## 2022-05-18 LAB — FACTOR 2 ASSAY: Factor II Activity: 107 % (ref 50–154)

## 2022-05-18 LAB — FACTOR 10 ASSAY: Factor X Activity: 114 % (ref 76–183)

## 2022-05-18 MED ORDER — THIAMINE MONONITRATE 100 MG PO TABS
100.0000 mg | ORAL_TABLET | Freq: Every day | ORAL | Status: DC
  Administered 2022-05-18 – 2022-05-22 (×5): 100 mg via ORAL
  Filled 2022-05-18 (×5): qty 1

## 2022-05-18 NOTE — Progress Notes (Signed)
Physical Therapy Treatment Patient Details Name: Austin Mercer. MRN: [id number] DOB: [birth year] Today's Date: 05/18/2022   History of Present Illness 72 yo male with onset of increased RLE weakness and pain was admitted 2/13 after having falls, notable edema on RLE knee.  Pt has reported inability to walk, after sliding down on BR floor.  Was just seen for new stroke three days prior to admit and sent home. 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 PMHx:  obesity, DM, HTN, CVA, HepC, EtOH abuse, cirrhosis, CHF    PT Comments    Session initiated with seated exercise for RLE with pt reporting min pain/discomfort. Progressed to standing for standing exercises and noted pt had had a BM. Patient able to stand ~4 minutes while pericare performed. Seated rest break with gown changed. Returned to standing and did marching. Pt then reported he was too tired to walk as he felt his rt knee might buckle. Patient currently unsafe/unable to climb 15 steps up to his apartment and will benefit from SNF rehab prior to home.     Recommendations for follow up therapy are one component of a multi-disciplinary discharge planning process, led by the attending physician.  Recommendations may be updated based on patient status, additional functional criteria and insurance authorization.  Follow Up Recommendations  Skilled nursing-short term rehab (<3 hours/day) Can patient physically be transported by private vehicle: No   Assistance Recommended at Discharge Frequent or constant Supervision/Assistance  Patient can return home with the following A lot of help with walking and/or transfers;A little help with bathing/dressing/bathroom;Assistance with cooking/housework;Direct supervision/assist for medications management;Direct supervision/assist for financial management;Assist for transportation;Help with stairs or ramp for  entrance   Equipment Recommendations  Rolling walker (2 wheels)    Recommendations for Other Services       Precautions / Restrictions Precautions Precautions: Fall Precaution Comments: R knee pain and weakness Restrictions Weight Bearing Restrictions: No     Mobility  Bed Mobility               General bed mobility comments: pt sitting EOB on arrival    Transfers Overall transfer level: Needs assistance Equipment used: Rolling walker (2 wheels) Transfers: Sit to/from Stand, Bed to chair/wheelchair/BSC Sit to Stand: Min guard   Step pivot transfers: Min assist       General transfer comment: bed elevated to simulate home set-up; also from recliner with bil armrests x 3 reps; uncontrolled descent, but improved with cues on anterior wt-shift and using his legs to lower himself slowly    Ambulation/Gait               General Gait Details: pt too fatigued after exercises and standing for pericare; felt his knee would give out   Stairs             Wheelchair Mobility    Modified Rankin (Stroke Patients Only) Modified Rankin (Stroke Patients Only) Pre-Morbid Rankin Score: No symptoms Modified Rankin: Moderately severe disability     Balance Overall balance assessment: Needs assistance Sitting-balance support: No upper extremity supported, Feet supported Sitting balance-Leahy Scale: Good     Standing balance support: Bilateral upper extremity supported, Reliant on assistive device for balance Standing balance-Leahy Scale: Poor Standing balance comment: less than fair dynamically  Cognition Arousal/Alertness: Awake/alert Behavior During Therapy: WFL for tasks assessed/performed Overall Cognitive Status: Impaired/Different from baseline Area of Impairment: Following commands, Safety/judgement, Attention                   Current Attention Level: Sustained Memory: Decreased short-term  memory Following Commands: Follows one step commands consistently Safety/Judgement: Decreased awareness of safety     General Comments: unable to attend to what nurse was asking him to do with wife talking in background; required repetition of instructions once distracted        Exercises General Exercises - Lower Extremity Long Arc Quad: AROM, Right, 10 reps Hip Flexion/Marching: Strengthening, Both, 10 reps, Standing (with RW) Other Exercises Other Exercises: Stood with support of RW x 4 minutes for pericare after small bowel movement when he was seated. Afterwards, too fatigued to ambulate.    General Comments        Pertinent Vitals/Pain Pain Assessment Pain Assessment: Faces Faces Pain Scale: Hurts a little bit Pain Location: R knee Pain Descriptors / Indicators: Aching, Guarding Pain Intervention(s): Limited activity within patient's tolerance, Monitored during session    Home Living     Available Help at Discharge: Friend(s);Available PRN/intermittently Type of Home: Apartment                  Prior Function            PT Goals (current goals can now be found in the care plan section) Acute Rehab PT Goals Patient Stated Goal: get home and to a new location if possible Time For Goal Achievement: 05/31/22 Potential to Achieve Goals: Good Progress towards PT goals: Progressing toward goals    Frequency    Min 3X/week      PT Plan Current plan remains appropriate    Co-evaluation              AM-PAC PT "6 Clicks" Mobility   Outcome Measure  Help needed turning from your back to your side while in a flat bed without using bedrails?: A Little Help needed moving from lying on your back to sitting on the side of a flat bed without using bedrails?: A Little Help needed moving to and from a bed to a chair (including a wheelchair)?: A Little Help needed standing up from a chair using your arms (e.g., wheelchair or bedside chair)?: A Little Help  needed to walk in hospital room?: A Little Help needed climbing 3-5 steps with a railing? : Total 6 Click Score: 16    End of Session Equipment Utilized During Treatment: Gait belt Activity Tolerance: Patient limited by fatigue Patient left: with call bell/phone within reach;in chair;with family/visitor present Nurse Communication: Mobility status PT Visit Diagnosis: Pain;Difficulty in walking, not elsewhere classified (R26.2);History of falling (Z91.81) Pain - Right/Left: Right Pain - part of body: Knee     Time: [id number] PT Time Calculation (min) (ACUTE ONLY): 19 min  Charges:  $Therapeutic Exercise: 8-22 mins                      Arby Barrette, PT Acute Rehabilitation Services  Office [phone]    Rexanne Mano 05/18/2022, 4:38 PM

## 2022-05-18 NOTE — NC FL2 (Signed)
[place] LEVEL OF CARE FORM     IDENTIFICATION  Patient Name: Austin Mercer. Birthdate: Feb 14, 1951 Sex: male Admission Date (Current Location): 05/16/2022  [facility] and [place] Number:  Herbalist and Address:  [facility], [place] [address], [place], [place] [address]      Provider Number: [id number]  Attending Physician Name and Address:  [name], MD  Relative Name and Phone Number:       Current Level of Care: Hospital Recommended Level of Care: [place] Prior Approval Number:    Date Approved/Denied:   PASRR Number: [id number]  Discharge Plan: SNF    Current Diagnoses: Patient Active Problem List   Diagnosis Date Noted   CVA (cerebral vascular accident) ([place]) 05/17/2022   Acute ischemic stroke ([place]) 05/11/2022   Microalbuminuria due to type 2 diabetes mellitus ([place]) 10/21/2021   Allergic rhinitis 10/18/2021   Hypophosphatemia 09/14/2021   Liver cirrhosis ([place]) 09/12/2021   Moderate episode of recurrent major depressive disorder ([place]) 09/09/2020   Toenail fungus [id number]   Chronic systolic heart failure ([place]) 03/11/2020   First degree AV block 03/04/2020   PVC (premature ventricular contraction) 03/04/2020   Depression 03/04/2020   History of alcohol use disorder 04/26/2018   Erectile dysfunction of organic origin 04/26/2018   Positive for macroalbuminuria 01/27/2018   Hypercalcemia 01/24/2018   Controlled type 2 diabetes mellitus without complication, without long-term current use of insulin ([place]) 11/23/2017   Essential hypertension 11/23/2017   Primary osteoarthritis of right knee 11/23/2017   Class 3 severe obesity due to excess calories with serious comorbidity and body mass index (BMI) of 45.0 to 49.9 in adult ([place]) 11/23/2017   Age-related cataract of right eye 11/23/2017   Hepatitis C virus infection cured after antiviral drug therapy 11/23/2017   Chronic, continuous use of  opioids 11/23/2017    Orientation RESPIRATION BLADDER Height & Weight     Self, Time, Situation, Place  Normal Continent Weight: (!) 345 lb 10.9 oz (156.8 kg) Height:  6' 1"$  (185.4 cm)  BEHAVIORAL SYMPTOMS/MOOD NEUROLOGICAL BOWEL NUTRITION STATUS      Continent Diet (heart healthy/carb modified)  AMBULATORY STATUS COMMUNICATION OF NEEDS Skin   Limited Assist Verbally Normal                       Personal Care Assistance Level of Assistance  Bathing, Feeding, Dressing Bathing Assistance: Limited assistance Feeding assistance: Limited assistance Dressing Assistance: Limited assistance     Functional Limitations Info             [place]  PT (By licensed PT), OT (By licensed OT)     PT Frequency: 5x/wk OT Frequency: 5x/wk            Contractures Contractures Info: Not present    Additional Factors Info  Code Status, Allergies, Insulin Sliding Scale Code Status Info: Full Allergies Info: Codeine, Latex   Insulin Sliding Scale Info: see DC summary       Current Medications (05/18/2022):  This is the current hospital active medication list Current Facility-Administered Medications  Medication Dose Route Frequency Provider Last Rate Last Admin   acetaminophen (TYLENOL) tablet 650 mg  650 mg Oral Q4H PRN Guilford Shi, MD   650 mg at 05/17/22 2227   Or   acetaminophen (TYLENOL) 160 MG/5ML solution 650 mg  650 mg Per Tube Q4H PRN Guilford Shi, MD       Or  acetaminophen (TYLENOL) suppository 650 mg  650 mg Rectal Q4H PRN Guilford Shi, MD       amLODipine (NORVASC) tablet 10 mg  10 mg Oral Daily Guilford Shi, MD   10 mg at 05/18/22 0946   aspirin EC tablet 81 mg  81 mg Oral Daily Guilford Shi, MD   81 mg at 05/18/22 0946   atorvastatin (LIPITOR) tablet 20 mg  20 mg Oral Daily Guilford Shi, MD   20 mg at 05/18/22 0946   carvedilol (COREG) tablet 12.5 mg  12.5 mg Oral BID WC Guilford Shi, MD   12.5 mg at  05/18/22 0851   clopidogrel (PLAVIX) tablet 75 mg  75 mg Oral Daily Guilford Shi, MD   75 mg at 05/18/22 0946   dapagliflozin propanediol (FARXIGA) tablet 10 mg  10 mg Oral QAC breakfast Guilford Shi, MD   10 mg at 05/18/22 0851   enoxaparin (LOVENOX) injection 40 mg  40 mg Subcutaneous Q24H Guilford Shi, MD   40 mg at 05/18/22 0946   famotidine (PEPCID) tablet 20 mg  20 mg Oral Daily Guilford Shi, MD   20 mg at 05/18/22 0946   furosemide (LASIX) injection 20 mg  20 mg Intravenous Daily Guilford Shi, MD   20 mg at 05/18/22 0946   insulin aspart (novoLOG) injection 0-15 Units  0-15 Units Subcutaneous TID WC Guilford Shi, MD   3 Units at 05/18/22 [id number]   spironolactone (ALDACTONE) tablet 25 mg  25 mg Oral Daily Guilford Shi, MD   25 mg at 05/18/22 0946   thiamine (VITAMIN B1) tablet 100 mg  100 mg Oral Daily [name], MD         Discharge Medications: Please see discharge summary for a list of discharge medications.  Relevant Imaging Results:  Relevant Lab Results:   Additional Information SS#: [id number]  Geralynn Ochs, LCSW

## 2022-05-18 NOTE — Evaluation (Signed)
Speech Language Pathology Evaluation Patient Details Name: Austin Mercer. MRN: [id number] DOB: 1950-09-21 Today's Date: 05/18/2022 Time: [id number] SLP Time Calculation (min) (ACUTE ONLY): 18 min  Problem List:  Patient Active Problem List   Diagnosis Date Noted   CVA (cerebral vascular accident) ([place]) 05/17/2022   Acute ischemic stroke ([place]) 05/11/2022   Microalbuminuria due to type 2 diabetes mellitus ([place]) 10/21/2021   Allergic rhinitis 10/18/2021   Hypophosphatemia 09/14/2021   Liver cirrhosis ([place]) 09/12/2021   Moderate episode of recurrent major depressive disorder ([place]) 09/09/2020   Toenail fungus [id number]   Chronic systolic heart failure ([place]) 03/11/2020   First degree AV block 03/04/2020   PVC (premature ventricular contraction) 03/04/2020   Depression 03/04/2020   History of alcohol use disorder 04/26/2018   Erectile dysfunction of organic origin 04/26/2018   Positive for macroalbuminuria 01/27/2018   Hypercalcemia 01/24/2018   Controlled type 2 diabetes mellitus without complication, without long-term current use of insulin ([place]) 11/23/2017   Essential hypertension 11/23/2017   Primary osteoarthritis of right knee 11/23/2017   Class 3 severe obesity due to excess calories with serious comorbidity and body mass index (BMI) of 45.0 to 49.9 in adult ([place]) 11/23/2017   Age-related cataract of right eye 11/23/2017   Hepatitis C virus infection cured after antiviral drug therapy 11/23/2017   Chronic, continuous use of opioids 11/23/2017   Past Medical History:  Past Medical History:  Diagnosis Date   Diabetes ([place])    Hypertension    Hypertensive urgency 03/04/2020   Stroke [facility])    Past Surgical History:  Past Surgical History:  Procedure Laterality Date   ANKLE FRACTURE SURGERY     HPI:  72 yo male with onset of increased RLE weakness and pain was admitted 2/13 after having falls, notable edema on RLE knee.  Pt has reported inability to walk, after sliding  down on BR floor.  Was just seen for new stroke three days prior to admit and sent home. 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 PMHx:  obesity, DM, HTN, CVA, HepC, EtOH abuse, cirrhosis, CHF.  SLP consulted for speech/language cognitive assessment.   Assessment / Plan / Recommendation Clinical Impression  Pt seen for speech/language cognitive assessment with [place] Mental Status Examination with a score obtained of 21/30 with deficits in the areas of attention, memory recall, awareness and problem solving.  This was compared to assessment given on 05/12/22 with a score on same assessment of 16/30 with deficits noted in the areas as listed above.  Chart review noted ETOH and baseline cognitive decline per family report and concerns with pt's current performance.  Recommend f/u with primary care physician and with ST at SNF for cognitive rehab/ educating pt and family re: compensatory strategies to use to improve overall cognitive function.  ST will s/o in acute setting.  Further needs can be addressed at next venue of care.  Thank you for this consult.    SLP Assessment  SLP Visit Diagnosis: Cognitive communication deficit (R41.841);Attention and concentration deficit Attention and concentration deficit following: Cerebral infarction    Recommendations for follow up therapy are one component of a multi-disciplinary discharge planning process, led by the attending physician.  Recommendations may be updated based on patient status, additional functional criteria and insurance authorization.    Follow Up Recommendations  Skilled nursing-short term rehab (<3 hours/day)    Assistance Recommended at Discharge  Frequent or constant  Supervision/Assistance  Functional Status Assessment Patient has had a recent decline in their functional status and/or demonstrates limited ability to make significant  improvements in function in a reasonable and predictable amount of time  Frequency and Duration           SLP Evaluation Cognition  Overall Cognitive Status: Impaired/Different from baseline Arousal/Alertness: Awake/alert Orientation Level: Oriented X4 Year: 2024 Month: February Day of Week: Correct Attention: Sustained Sustained Attention: Impaired Sustained Attention Impairment: Verbal basic;Functional basic Memory: Impaired Memory Impairment: Decreased recall of new information Awareness: Impaired Awareness Impairment: Intellectual impairment Problem Solving: Impaired Problem Solving Impairment: Verbal basic;Functional basic Behaviors: Impulsive Safety/Judgment: Impaired       Comprehension  Auditory Comprehension Overall Auditory Comprehension: Appears within functional limits for tasks assessed Yes/No Questions: Within Functional Limits Commands: Not tested Conversation: Simple Interfering Components: Attention;Working Field seismologist: Conservation officer, nature: Not tested Reading Comprehension Reading Status: Not tested    Expression Expression Primary Mode of Expression: Verbal Verbal Expression Overall Verbal Expression: Appears within functional limits for tasks assessed Level of Generative/Spontaneous Verbalization: Conversation Naming: Not tested Pragmatics: Unable to assess Interfering Components: Attention Non-Verbal Means of Communication: Not applicable Written Expression Dominant Hand: Right Written Expression: Exceptions to [facility] Interfering Components: Attention   Oral / Motor  Oral Motor/Sensory Function Overall Oral Motor/Sensory Function: Within functional limits Motor Speech Overall Motor Speech: Appears within functional limits for tasks assessed Respiration: Within functional limits Phonation: Normal Resonance: Within functional limits Articulation: Within functional  limitis Intelligibility: Intelligible Motor Planning: Witnin functional limits Motor Speech Errors: Not applicable            Pat Austin Mercer,M.S., CCC-SLP 05/18/2022, 3:14 PM

## 2022-05-18 NOTE — Progress Notes (Addendum)
STROKE TEAM PROGRESS NOTE   INTERVAL HISTORY He is sitting in a bedside chair.  He states he is feeling better.  He has no complaints.  Vital signs stable.  Neurological exam unchanged.  Physical therapy recommended rehab and skilled nursing setting.  Patient decided not to participate in the sleep smart study. Vitals:   05/17/22 2355 05/18/22 0450 05/18/22 0726 05/18/22 1118  BP: (!) 111/50 (!) 139/58 (!) 140/79 (!) 146/70  Pulse: 60 (!) 53 61 63  Resp: 16 16 16 17  $ Temp: 98.6 F (37 C) 98.1 F (36.7 C) 97.9 F (36.6 C) 98 F (36.7 C)  TempSrc: Oral Oral Oral Oral  SpO2: 97% 99% 97% 93%  Weight:      Height:       CBC:  Recent Labs  Lab 05/16/22 1547 05/18/22 0626  WBC 7.3 6.0  NEUTROABS 3.2  --   HGB 13.6 13.3  HCT 41.7 40.3  MCV 82.7 80.4  PLT 141* [id number]*   Basic Metabolic Panel:  Recent Labs  Lab 05/16/22 1547 05/18/22 0626  NA 133* 132*  K 4.7 4.4  CL 104 103  CO2 21* 23  GLUCOSE 144* 141*  BUN 19 21  CREATININE 1.64* 1.55*  CALCIUM 10.6* 10.7*  MG 1.9  --    Lipid Panel:  Recent Labs  Lab 05/18/22 0626  CHOL 109  TRIG 106  HDL 29*  CHOLHDL 3.8  VLDL 21  LDLCALC 59   HgbA1c: No results for input(s): "HGBA1C" in the last 168 hours. Urine Drug Screen:  No results for input(s): "LABOPIA", "COCAINSCRNUR", "LABBENZ", "AMPHETMU", "THCU", "LABBARB" in the last 168 hours.   Alcohol Level  No results for input(s): "ETH" in the last 168 hours.   IMAGING past 24 hours No results found.  PHYSICAL EXAM Constitutional: Appears obese middle-age African-American male.   Cardiovascular: Normal rate and regular rhythm.  Respiratory: Effort normal, non-labored breathing   Neuro: Mental Status: Patient is awake, alert, oriented to person, place, month, year, and situation. Patient is able to give a clear and coherent history. No signs of aphasia or neglect Cranial Nerves: II: Visual Fields are full. Pupils are equal, round, and reactive to light.    III,IV, VI: EOMI without ptosis or diploplia.  V: Facial sensation is symmetric to temperature VII: Facial movement is symmetric resting and smiling VIII: Hearing is intact to voice X: Palate elevates symmetrically XI: Shoulder shrug is symmetric. XII: Tongue protrudes midline without atrophy or fasciculations.  Motor: Tone is normal. Bulk is normal. 5/5 strength was present in bilateral upper extremities 4/5 strength in bilateral lower extremities.  Mild right leg drift Sensory: Sensation is symmetric to light touch in the arms and legs.  Cerebellar: FNF and HKS are intact bilaterally Gait deferred for safety    ASSESSMENT/PLAN Mr. Austin Mercer. is a 72 y.o. male with history of elevated BMI, HTN, DM, recent BL embolic strokes s/p tnkase and discharged home about 3 days ago who returns with difficulty ambulating and unable to stand up on his own today. His neurologic examination is notable for mild RLE weakness.   Stroke:  Acute infarct in the left pons Etiology: More consistent with small vessel disease Code Stroke CT head No acute abnormality. ASPECTS 10.    CTA head & neck - advanced and widespread intracranial atherosclerosis: highly stenotic Right fetal PCA. moderate tandem stenoses of the contralateral Left PCA. mild bilateral ACA A1 stenosis, and moderate to severe stenosis. Left ACA A2 and right  A3. up to moderate Right MCA M3/4 branch stenoses. mild stenoses of the Basilar artery, ICA siphons. MRI 2/8 No acute intracranial abnormality identified. 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 Repeat Head CT- No acute intracranial abnormality MRI 2/10 - Small acute bilateral parieto-occipital cortical infarcts.  MRI Brain 2/10- Left pontine infarct  2D Echo- EF 55-60% LE venous doppler no DVT EEG- This study is suggestive of moderate diffuse encephalopathy, nonspecific etiology. No seizures LDL  56 HgbA1c 6.4 UDS neg VTE prophylaxis - heparin No antithrombotic prior to admission, now on aspirin 81 mg and plavix 75 DAPT daily for 3 months and then ASA alone. Therapy recommendations: Pending Disposition: Pending  CHF Reserved EF 55-60% now On home coreg, lasix, spironolactone, cozaar and faxiga  Followed up in [place] in the past   Hypertension Home meds:  norvasc, coreg, losartan, lasix, spironolactone,  Stable Long-term BP goal normotensive   Hyperlipidemia Home meds:  lipitor 12m, resumed in hospital LDL 56, goal < 70 No high intensity given LDL at goal Continue statin at discharge  Other Stroke Risk Factors Advanced Age >/= 673 Obesity, Body mass index is 47.5 kg/m., BMI >/= 30 associated with increased stroke risk, recommend weight loss, diet and exercise as appropriate  At risk for sleep apnea-patient considering participation in the sleep smart study Previous stroke  2/8- Bilateral parietal occipital embolic infarcts s/p TNK, likely cardio embolic, source unclear   Hospital day # 1  Patient seen and examined by NP/APP with MD. MD to update note as needed.   Patient with recent bilateral parietal occipital embolic infarcts of cryptogenic etiology who was treated with IV TNK and made good recovery.  He presented with difficulty walking and fall and MRI shows a new left pontine infarct likely from small vessel disease.  He also has multifocal intracranial occlusive disease.  Recommend aspirin and Plavix for 3 months followed by aspirin alone and aggressive risk factor modification.  No need for repeating stroke workup as he had a complete workup recently.  He may need loop recorder at discharge to look for paroxysmal A-fib.  Continue physical occupational therapy consults and transfer to rehabilitation when bed available.   Patient declined participation in sleep smart stroke prevention study.  Stroke team will sign off.  Kindly call for questions greater than 50% time  during this 35-minute visit was spent in counseling and coordination of care about his stroke and discussion about sleep apnea and stroke prevention and answering questions.  Discussed with Dr. KMaryland Pink.  PAntony Contras MD Medical Director [facility] Pager: [phone]/15/2024 12:45 PM  To contact Stroke Continuity provider, please refer to [email or web address] After hours, contact General Neurology

## 2022-05-18 NOTE — Plan of Care (Signed)
  Problem: Education: Goal: Knowledge of disease or condition will improve Outcome: Progressing   Problem: Education: Goal: Knowledge of secondary prevention will improve (MUST DOCUMENT ALL) Outcome: Progressing   Problem: Education: Goal: Knowledge of patient specific risk factors will improve (Mark N/A or DELETE if not current risk factor) Outcome: Progressing   Problem: Ischemic Stroke/TIA Tissue Perfusion: Goal: Complications of ischemic stroke/TIA will be minimized Outcome: Progressing   

## 2022-05-18 NOTE — TOC Initial Note (Signed)
Transition of Care (TOC) - Initial/Assessment Note    Patient Details  Name: Austin Mercer. MRN: [id number] Date of Birth: 02-Feb-1951  Transition of Care [facility]) CM/SW Contact:    Geralynn Ochs, LCSW Phone Number: 05/18/2022, 12:52 PM  Clinical Narrative:           CSW met with patient and roommate at bedside to discuss recommendation for SNF. They are both in agreement, roommate very concerned about patient coming home and navigating stairs. Per roommate, they've been trying to get moved to a first floor apartment but need an MD note to justify to apartment complex. CSW asked MD, will provide a letter to assist with patient relocating to first floor. CSW faxed out referral for SNF, will follow with bed offers.        Expected Discharge Plan: Skilled Nursing Facility Barriers to Discharge: Continued Medical Work up, Ship broker   Patient Goals and CMS Choice Patient states their goals for this hospitalization and ongoing recovery are:: to get better CMS Medicare.gov Compare Post Acute Care list provided to:: Patient Choice offered to / list presented to : Patient [place] ownership interest in [facility].provided to:: Patient    Expected Discharge Plan and [place] Choice: [place] Living arrangements for the past 2 months: Apartment                                      Prior Living Arrangements/Services Living arrangements for the past 2 months: Apartment Lives with:: Roommate Patient language and need for interpreter reviewed:: No Do you feel safe going back to the place where you live?: Yes      Need for Family Participation in Patient Care: No (Comment) Care giver support system in place?: Yes (comment)   Criminal Activity/Legal Involvement Pertinent to Current Situation/Hospitalization: No - Comment as needed  Activities of Daily Living Home Assistive Devices/Equipment: None (Simultaneous  filing. User may not have seen previous data.) ADL Screening (condition at time of admission) Patient's cognitive ability adequate to safely complete daily activities?: Yes (Simultaneous filing. User may not have seen previous data.) Is the patient deaf or have difficulty hearing?: No (Simultaneous filing. User may not have seen previous data.) Does the patient have difficulty seeing, even when wearing glasses/contacts?: No (Simultaneous filing. User may not have seen previous data.) Does the patient have difficulty concentrating, remembering, or making decisions?: No (Simultaneous filing. User may not have seen previous data.) Patient able to express need for assistance with ADLs?: Yes (Simultaneous filing. User may not have seen previous data.) Does the patient have difficulty dressing or bathing?: No (Simultaneous filing. User may not have seen previous data.) Independently performs ADLs?: Yes (appropriate for developmental age) (Simultaneous filing. User may not have seen previous data.) Communication: Independent (Simultaneous filing. User may not have seen previous data.) Is this a change from baseline?: Pre-admission baseline (Simultaneous filing. User may not have seen previous data.) Dressing (OT): Independent (Simultaneous filing. User may not have seen previous data.) Is this a change from baseline?: Pre-admission baseline (Simultaneous filing. User may not have seen previous data.) Grooming: Independent (Simultaneous filing. User may not have seen previous data.) Is this a change from baseline?: Pre-admission baseline (Simultaneous filing. User may not have seen previous data.) Feeding: Independent (Simultaneous filing. User may not have seen previous data.) Is this a change from baseline?: Pre-admission baseline (  Simultaneous filing. User may not have seen previous data.) Bathing: Independent (Simultaneous filing. User may not have seen previous data.) Is this a change from baseline?:  Pre-admission baseline (Simultaneous filing. User may not have seen previous data.) Toileting: Independent (Simultaneous filing. User may not have seen previous data.) Is this a change from baseline?: Pre-admission baseline (Simultaneous filing. User may not have seen previous data.) In/Out Bed: Independent (Simultaneous filing. User may not have seen previous data.) Is this a change from baseline?: Pre-admission baseline (Simultaneous filing. User may not have seen previous data.) Walks in Home: Needs assistance (Simultaneous filing. User may not have seen previous data.) Is this a change from baseline?: Change from baseline, expected to last <3 days (Simultaneous filing. User may not have seen previous data.) Does the patient have difficulty walking or climbing stairs?: Yes (Simultaneous filing. User may not have seen previous data.) Weakness of Legs: Both (Simultaneous filing. User may not have seen previous data.) Weakness of Arms/Hands: Both (Simultaneous filing. User may not have seen previous data.)  Permission Sought/Granted Permission sought to share information with : Facility Sport and exercise psychologist, Family Supports Permission granted to share information with : Yes, Verbal Permission Granted  Share Information with NAME: Johny Chess  Permission granted to share info w AGENCY: SNF  Permission granted to share info w Relationship: Sister, Roommate     Emotional Assessment Appearance:: Appears stated age Attitude/Demeanor/Rapport: Engaged Affect (typically observed): Appropriate Orientation: : Oriented to Self, Oriented to Place, Oriented to  Time, Oriented to Situation Alcohol / Substance Use: Not Applicable Psych Involvement: No (comment)  Admission diagnosis:  CVA (cerebral vascular accident) ([place]) [I63.9] Leg weakness, bilateral [R29.898] Patient Active Problem List   Diagnosis Date Noted   CVA (cerebral vascular accident) ([place]) 05/17/2022   Acute ischemic stroke ([place])  05/11/2022   Microalbuminuria due to type 2 diabetes mellitus ([place]) 10/21/2021   Allergic rhinitis 10/18/2021   Hypophosphatemia 09/14/2021   Liver cirrhosis ([place]) 09/12/2021   Moderate episode of recurrent major depressive disorder ([place]) 09/09/2020   Toenail fungus [id number]   Chronic systolic heart failure ([place]) 03/11/2020   First degree AV block 03/04/2020   PVC (premature ventricular contraction) 03/04/2020   Depression 03/04/2020   History of alcohol use disorder 04/26/2018   Erectile dysfunction of organic origin 04/26/2018   Positive for macroalbuminuria 01/27/2018   Hypercalcemia 01/24/2018   Controlled type 2 diabetes mellitus without complication, without long-term current use of insulin ([place]) 11/23/2017   Essential hypertension 11/23/2017   Primary osteoarthritis of right knee 11/23/2017   Class 3 severe obesity due to excess calories with serious comorbidity and body mass index (BMI) of 45.0 to 49.9 in adult ([place]) 11/23/2017   Age-related cataract of right eye 11/23/2017   Hepatitis C virus infection cured after antiviral drug therapy 11/23/2017   Chronic, continuous use of opioids 11/23/2017   PCP:  [name], MD Pharmacy:   [facility], [place] [address] Phone: [phone] Fax: [place] [address] [place] [address] Phone: [phone] Fax: [phone]  [facility] [place], [place] [address] Phone: [phone] Fax: [phone]  [id number] - [place], [place] - [address] [place] [address] Phone: [phone] Fax: [phone]  [facility] [place], [place] AT [facility] [address] [place] [address] Phone: [phone] Fax: [phone]     Social Determinants of Health (SDOH) Social History: SDOH Screenings   Food Insecurity: No Food Insecurity (05/17/2022)  Housing: Low Risk  (05/17/2022)  Transportation Needs: No Transportation Needs (05/17/2022)  Utilities: Not At Risk (05/17/2022)  Depression (PHQ2-9): Low Risk  (03/02/2022)  Tobacco Use: Medium Risk (05/16/2022)   SDOH Interventions:     Readmission Risk Interventions     No data to display

## 2022-05-18 NOTE — Progress Notes (Signed)
[facility] PROGRESS NOTE   Austin Mercer. [id number] DOB: 1950/09/15 DOA: 05/16/2022  PCP: [name], MD  Brief History/Interval Summary: 72 y.o. male with history h/o obesity, diabetes mellitus type 2, hypertension who was recently admitted to this facility for CVA-bilateral embolic stroke s/p tnkase and discharged home with DAPT, presented with complaints of worsening right leg weakness and inability to walk.  MRI revealed new strokes.  He was hospitalized for further management.    Consultants: Neurology  Procedures: None yet    Subjective/Interval History: Patient mentions that his right leg weakness is slightly better today though he is still quite fatigued.  Denies any chest pain or shortness of breath.  No nausea or vomiting.    Assessment/Plan:  Acute stroke Recently hospitalized for stroke and was discharged on dual antiplatelet treatment with aspirin and Plavix.  Presented again with new weakness especially in the right lower extremity.  MRI shows new stroke Neurology is following.  They are considering loop recorder placement. Patient had extensive neurological workup during his admission recently for his previous stroke. LDL is 59.  No recent HbA1c noted.  Will order. Patient is on aspirin and Plavix.  Also on statin. PT and OT evaluation.  SNF is recommended for short-term rehab.  Chronic systolic CHF/essential hypertension Patient does have bilateral lower extremity edema and is on diuretics at home.  Recent echocardiogram showed normal left ventricular ejection fraction. Continue with home medications.  He is noted to be on carvedilol, furosemide and spironolactone.  Hyperlipidemia Continue with statin  Diabetes mellitus type 2 Metformin on hold.  Monitor CBGs and SSI.  Check HbA1c  History of hepatitis C/liver cirrhosis He has been treated with hepatitis C with antivirals.  Seems to be stable. No concerns identified on CT  scan.  Chronic alcohol use previously Last drink was 2 weeks prior to admission.  No withdrawal symptoms noted.  Initiate thiamine.  Elevated creatinine Creatinine noted to be elevated for the last few days including previous hospitalization.  No known history of chronic kidney disease but he could have Chronic kidney disease stage II-IIIa.  Will need further monitoring in the outpatient setting.  Morbid obesity Estimated body mass index is 45.61 kg/m as calculated from the following:   Height as of this encounter: 6' 1"$  (1.854 m).   Weight as of this encounter: 156.8 kg.  DVT Prophylaxis: On Lovenox Code Status: Full code Family Communication: Discussed with patient Disposition Plan: SNF  Status is: Inpatient Remains inpatient appropriate because: Acute stroke      Medications: Scheduled:  amLODipine  10 mg Oral Daily   aspirin EC  81 mg Oral Daily   atorvastatin  20 mg Oral Daily   carvedilol  12.5 mg Oral BID WC   clopidogrel  75 mg Oral Daily   dapagliflozin propanediol  10 mg Oral QAC breakfast   enoxaparin (LOVENOX) injection  40 mg Subcutaneous Q24H   famotidine  20 mg Oral Daily   furosemide  20 mg Intravenous Daily   insulin aspart  0-15 Units Subcutaneous TID WC   spironolactone  25 mg Oral Daily   Continuous: [id number] **OR** acetaminophen (TYLENOL) oral liquid 160 mg/5 mL **OR** acetaminophen  Antibiotics: Anti-infectives (From admission, onward)    None       Objective:  Vital Signs  Vitals:   05/17/22 1950 05/17/22 2355 05/18/22 0450 05/18/22 0726  BP: 134/70 (!) 111/50 (!) 139/58 (!) 140/79  Pulse: 62 60 (!) 53 61  Resp: 18  $16 16 16  T$ Temp: 98.2 F (36.8 C) 98.6 F (37 C) 98.1 F (36.7 C) 97.9 F (36.6 C)  TempSrc: Oral Oral Oral Oral  SpO2: 96% 97% 99% 97%  Weight:      Height:        Intake/Output Summary (Last 24 hours) at 05/18/2022 1028 Last data filed at 05/18/2022 0900 Gross per 24 hour  Intake 237 ml  Output 1500 ml   Net -1263 ml   Filed Weights   05/17/22 1818  Weight: (!) 156.8 kg    General appearance: Awake alert.  In no distress Resp: Clear to auscultation bilaterally.  Normal effort Cardio: S1-S2 is normal regular.  No S3-S4.  No rubs murmurs or bruit GI: Abdomen is soft.  Nontender nondistended.  Bowel sounds are present normal.  No masses organomegaly Extremities: Bilateral lower extremity edema noted Neurologic: Alert and oriented x3.  Right-sided weakness noted especially in the right lower extremity   Lab Results:  Data Reviewed: I have personally reviewed following labs and reports of the imaging studies  CBC: Recent Labs  Lab 05/11/22 1119 05/16/22 1547 05/18/22 0626  WBC  --  7.3 6.0  NEUTROABS  --  3.2  --   HGB 13.3 13.6 13.3  HCT 39.0 41.7 40.3  MCV  --  82.7 80.4  PLT  --  141* 135*    Basic Metabolic Panel: Recent Labs  Lab 05/11/22 1119 05/13/22 0304 05/16/22 1547 05/18/22 0626  NA 136 131* 133* 132*  K 4.0 4.0 4.7 4.4  CL  --  102 104 103  CO2  --  20* 21* 23  GLUCOSE  --  127* 144* 141*  BUN  --  19 19 21  $ CREATININE  --  1.32* 1.64* 1.55*  CALCIUM  --  10.4* 10.6* 10.7*  MG  --   --  1.9  --     GFR: Estimated Creatinine Clearance: 70.4 mL/min (A) (by C-G formula based on SCr of 1.55 mg/dL (H)).  Liver Function Tests: Recent Labs  Lab 05/16/22 1547  AST 25  ALT 22  ALKPHOS 50  BILITOT 0.3  PROT 7.6  ALBUMIN 3.9     CBG: Recent Labs  Lab 05/13/22 0559 05/13/22 1205 05/17/22 1240 05/17/22 1614 05/18/22 0615  GLUCAP 136* 106* 183* 113* 155*    Lipid Profile: Recent Labs    05/18/22 0626  CHOL 109  HDL 29*  LDLCALC 59  TRIG 106  CHOLHDL 3.8     Recent Results (from the past 240 hour(s))  MRSA Next Gen by PCR, Nasal     Status: None   Collection Time: 05/11/22 10:54 AM   Specimen: Nasal Mucosa; Nasal Swab  Result Value Ref Range Status   MRSA by PCR Next Gen NOT DETECTED NOT DETECTED Final    Comment: (NOTE) The  GeneXpert MRSA Assay (FDA approved for NASAL specimens only), is one component of a comprehensive MRSA colonization surveillance program. It is not intended to diagnose MRSA infection nor to guide or monitor treatment for MRSA infections. Test performance is not FDA approved in patients less than 48 years old. Performed at [facility], [place] [address] [place],  [address]       Radiology Studies: CT CHEST ABDOMEN PELVIS W CONTRAST  Result Date: 05/17/2022 CLINICAL DATA:  Assessment for occult malignancy. History of diabetes and hypertension. EXAM: CT CHEST, ABDOMEN, AND PELVIS WITH CONTRAST TECHNIQUE: Multidetector CT imaging of the chest, abdomen and pelvis was performed  following the standard protocol during bolus administration of intravenous contrast. RADIATION DOSE REDUCTION: This exam was performed according to the departmental dose-optimization program which includes automated exposure control, adjustment of the mA and/or kV according to patient size and/or use of iterative reconstruction technique. CONTRAST:  14m OMNIPAQUE IOHEXOL 350 MG/ML SOLN COMPARISON:  Abdominopelvic CT 01/12/2022. Chest radiographs 05/16/2022. FINDINGS: CT CHEST FINDINGS Cardiovascular: No acute vascular findings. Mild atherosclerosis of the aorta, great vessels and coronary arteries. There is a small anterior pericardial effusion versus pericardial thickening. The heart size is normal. Mediastinum/Nodes: There are no enlarged mediastinal, hilar or axillary lymph nodes. Small mediastinal and axillary lymph nodes are noted bilaterally. The thyroid gland, trachea and esophagus demonstrate no significant findings. Lungs/Pleura: No pleural effusion or pneumothorax. No suspicious pulmonary nodules. Musculoskeletal/Chest wall: No chest wall mass or suspicious osseous findings. Mild spondylosis. CT ABDOMEN AND PELVIS FINDINGS Hepatobiliary: The liver is normal in density without suspicious focal abnormality. No  evidence of gallstones, gallbladder wall thickening or biliary dilatation. Pancreas: Unremarkable. No pancreatic ductal dilatation or surrounding inflammatory changes. Spleen: Normal in size without focal abnormality. Adrenals/Urinary Tract: Both adrenal glands appear normal. No evidence of urinary tract calculus, suspicious renal lesion or hydronephrosis. The bladder appears normal for its degree of distention. Stomach/Bowel: No enteric contrast administered. The stomach appears unremarkable for its degree of distension. No evidence of bowel wall thickening, distention or surrounding inflammatory change. The appendix appears normal. Vascular/Lymphatic: There are no enlarged abdominal or pelvic lymph nodes. Aortic and branch vessel atherosclerosis without evidence of aneurysm or large vessel occlusion. Reproductive: The prostate gland and seminal vesicles appear unremarkable. Other: No evidence of abdominal wall mass or hernia. No ascites. Musculoskeletal: No acute osseous findings. Multilevel lumbar spondylosis with chronic severe foraminal narrowing bilaterally at L5-S1. Probable central canal stenosis in the lower lumbar spine. IMPRESSION: 1. No acute findings or evidence of malignancy within the chest, abdomen or pelvis. 2. Small anterior pericardial effusion versus pericardial thickening. 3. Multilevel lumbar spondylosis with chronic severe foraminal narrowing bilaterally at L5-S1. 4.  Aortic Atherosclerosis (ICD10-I70.0). Electronically Signed   By: WRichardean SaleM.D.   On: 05/17/2022 10:38   MR BRAIN WO CONTRAST  Result Date: 05/17/2022 CLINICAL DATA:  Stroke suspected, increased bilateral leg weakness EXAM: MRI HEAD WITHOUT CONTRAST TECHNIQUE: Multiplanar, multiecho pulse sequences of the brain and surrounding structures were obtained without intravenous contrast. COMPARISON:  05/13/2021 FINDINGS: Brain: New area of restricted diffusion with ADC correlate in the left pons (series 2, images 15-17 and  series 3, image 17), consistent with acute infarct. Other foci of mildly increased signal on diffusion-weighted imaging bilateral occipital lobes and left parietal lobe were present on the prior exam, likely subacute infarcts. No acute hemorrhage, mass, mass effect, or midline shift. No hydrocephalus or extra-axial collection. T2 hyperintense signal in the periventricular white matter, likely the sequela of moderate chronic small vessel ischemic disease. Remote lacunar infarcts in the right greater than left basal ganglia. Vascular: Normal arterial flow voids. Skull and upper cervical spine: Normal marrow signal. Sinuses/Orbits: No acute finding. Other: Trace fluid in right mastoid air cells. IMPRESSION: 1. 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 2. Redemonstrated subacute infarcts in the bilateral occipital lobes and left parietal lobe. These results were called by telephone at the time of interpretation on 05/16/2022 at 11:59 pm to provider POLLINA, who verbally acknowledged these results. Electronically Signed   By: AMerilyn BabaM.D.   On: 05/17/2022 00:02   CT Head Wo Contrast  Result  Date: 05/16/2022 CLINICAL DATA:  Recent stroke with increased bilateral leg weakness unable to stand EXAM: CT HEAD WITHOUT CONTRAST TECHNIQUE: Contiguous axial images were obtained from the base of the skull through the vertex without intravenous contrast. RADIATION DOSE REDUCTION: This exam was performed according to the departmental dose-optimization program which includes automated exposure control, adjustment of the mA and/or kV according to patient size and/or use of iterative reconstruction technique. COMPARISON:  CT 05/12/2022, MRI 05/13/2022 FINDINGS: Brain: No acute territorial infarction, hemorrhage, or intracranial mass. Atrophy and chronic small vessel ischemic changes of the white matter. 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 Recently demonstrated small parietooccipital cortical  infarcts on MRI are not well seen by CT. The ventricles are stable in size. Vascular: No hyperdense vessels.  Carotid vascular calcification Skull: Normal. Negative for fracture or focal lesion. Sinuses/Orbits: No acute finding. Other: None IMPRESSION: 1. No CT evidence for acute intracranial abnormality. 2. Atrophy and chronic small vessel ischemic changes of the white matter. 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 Recently demonstrated small cortical infarcts on MRI are not well seen by CT Electronically Signed   By: Donavan Foil M.D.   On: 05/16/2022 17:30   DG Chest 1 View  Result Date: 05/16/2022 CLINICAL DATA:  Pt having leg and arm weakness, feeling overall weak since yesterday - hx of stroke, htn, diabetes EXAM: CHEST  1 VIEW COMPARISON:  03/06/2020 FINDINGS: Lungs are clear. Heart size and mediastinal contours are within normal limits. Aortic Atherosclerosis (ICD10-170.0). No effusion. Visualized bones unremarkable. IMPRESSION: No acute cardiopulmonary disease. Electronically Signed   By: Lucrezia Europe M.D.   On: 05/16/2022 16:12       LOS: 1 day   [place] [facility] Pager on [email or web address]  05/18/2022, 10:28 AM

## 2022-05-18 NOTE — Progress Notes (Signed)
Mobility Specialist: Progress Note   05/18/22 1115  Mobility  Activity Ambulated with assistance in hallway  Level of Assistance +2 (takes two people) (CF for safety)  Assistive Device Front wheel walker  Distance Ambulated (ft) 200 ft  Activity Response Tolerated well  Mobility Referral Yes  $Mobility charge 1 Mobility   Pre-Mobility: 62 HR Post-Mobility: 68 HR  Pt received in the chair and agreeable to mobility. MinA to stand and contact guard during ambulation. Cues for RW proximity and upright posture throughout. C/o Rt knee discomfort but no c/o pain. Pt back to the chair after session with call bell and phone in reach.   [place] Jourdyn Ferrin Mobility Specialist Please contact via SecureChat or Rehab office at [phone]

## 2022-05-19 ENCOUNTER — Other Ambulatory Visit: Payer: Self-pay

## 2022-05-19 DIAGNOSIS — I639 Cerebral infarction, unspecified: Secondary | ICD-10-CM | POA: Diagnosis not present

## 2022-05-19 LAB — BASIC METABOLIC PANEL
Anion gap: 9 (ref 5–15)
BUN: 24 mg/dL — ABNORMAL HIGH (ref 8–23)
CO2: 22 mmol/L (ref 22–32)
Calcium: 10.8 mg/dL — ABNORMAL HIGH (ref 8.9–10.3)
Chloride: 102 mmol/L (ref 98–111)
Creatinine, Ser: 1.54 mg/dL — ABNORMAL HIGH (ref 0.61–1.24)
GFR, Estimated: 49 mL/min — ABNORMAL LOW (ref >60)
Glucose, Bld: 125 mg/dL — ABNORMAL HIGH (ref 70–99)
Potassium: 4.5 mmol/L (ref 3.5–5.1)
Sodium: 133 mmol/L — ABNORMAL LOW (ref 135–145)

## 2022-05-19 LAB — HEMOGLOBIN A1C
Hgb A1c MFr Bld: 6.2 % — ABNORMAL HIGH (ref 4.8–5.6)
Mean Plasma Glucose: 131.24 mg/dL

## 2022-05-19 LAB — GLUCOSE, CAPILLARY
Glucose-Capillary: 148 mg/dL — ABNORMAL HIGH (ref 70–99)
Glucose-Capillary: 188 mg/dL — ABNORMAL HIGH (ref 70–99)

## 2022-05-19 MED ORDER — TRAMADOL HCL 50 MG PO TABS
50.0000 mg | ORAL_TABLET | Freq: Two times a day (BID) | ORAL | Status: DC | PRN
  Administered 2022-05-19 – 2022-05-21 (×2): 50 mg via ORAL
  Filled 2022-05-19 (×2): qty 1

## 2022-05-19 MED ORDER — VITAMIN B-1 100 MG PO TABS: 100.0000 mg | ORAL_TABLET | Freq: Every day | ORAL | Status: DC

## 2022-05-19 NOTE — Discharge Summary (Signed)
[facility]  Physician Discharge Summary   Patient ID: Austin Mercer. MRN: [id number] DOB/AGE: 1950-08-08 72 y.o.  Admit date: 05/16/2022 Discharge date:   05/19/2022   PCP: [name], MD  DISCHARGE DIAGNOSES:    Acute ischemic stroke [facility])   Controlled type 2 diabetes mellitus without complication, without long-term current use of insulin ([place])   Essential hypertension   Class 3 severe obesity due to excess calories with serious comorbidity and body mass index (BMI) of 45.0 to 49.9 in adult [facility])   Hepatitis C virus infection cured after antiviral drug therapy   History of alcohol use disorder   Liver cirrhosis (HCC)   CVA (cerebral vascular accident) ([place])   RECOMMENDATIONS FOR OUTPATIENT FOLLOW UP: Please check CBC and basic metabolic panel in 3 to 4 days Ambulatory referral sent to neurology for outpatient follow-up   Home Health: SNF Equipment/Devices: None  CODE STATUS: Full code  DISCHARGE CONDITION: fair  Diet recommendation: Modified carbohydrate/heart healthy  INITIAL HISTORY:  72 y.o. male with history h/o obesity, diabetes mellitus type 2, hypertension who was recently admitted to this facility for CVA-bilateral embolic stroke s/p tnkase and discharged home with DAPT, presented with complaints of worsening right leg weakness and inability to walk.  MRI revealed new strokes.  He was hospitalized for further management.     Consultants: Neurology   Procedures: None    HOSPITAL COURSE:   Acute stroke Recently hospitalized for stroke and was discharged on dual antiplatelet treatment with aspirin and Plavix.  Presented again with new weakness especially in the right lower extremity.  MRI shows new stroke Neurology is following.  They are considering loop recorder placement.  May need to be done in the outpatient setting and will defer to neurology. Patient had extensive neurological workup during his admission recently for his previous  stroke. LDL is 59.  HbA1c 6.2. Patient is on aspirin and Plavix.  Also on statin. PT and OT evaluation.  SNF is recommended for short-term rehab.   Chronic systolic CHF/essential hypertension Patient does have bilateral lower extremity edema and is on diuretics at home.  Recent echocardiogram showed normal left ventricular ejection fraction. Continue with home medications.  He is noted to be on carvedilol, furosemide and spironolactone.  Also on ARB which can be resumed at discharge.  Renal function will need to be monitored closely.   Hyperlipidemia Continue with statin   Diabetes mellitus type 2 Continue with home occasions.   History of hepatitis C/liver cirrhosis He has been treated with hepatitis C with antivirals.  Seems to be stable. No concerns identified on CT scan.   Chronic alcohol use previously Last drink was 2 weeks prior to admission.  No withdrawal symptoms noted.  Continue with thiamine.   Elevated creatinine Creatinine noted to be elevated for the last few days including previous hospitalization.  No known history of chronic kidney disease but he could have Chronic kidney disease stage II-IIIa.  Will need further monitoring in the outpatient setting.   Morbid obesity Estimated body mass index is 45.61 kg/m as calculated from the following:   Height as of this encounter: 6' 1"$  (1.854 m).   Weight as of this encounter: 156.8 kg.    Patient is stable.  Okay for discharge to SNF when bed is available.  PERTINENT LABS:  The results of significant diagnostics from this hospitalization (including imaging, microbiology, ancillary and laboratory) are listed below for reference.    Microbiology: Recent Results (from the past 240  hour(s))  MRSA Next Gen by PCR, Nasal     Status: None   Collection Time: 05/11/22 10:54 AM   Specimen: Nasal Mucosa; Nasal Swab  Result Value Ref Range Status   MRSA by PCR Next Gen NOT DETECTED NOT DETECTED Final    Comment: (NOTE) The  GeneXpert MRSA Assay (FDA approved for NASAL specimens only), is one component of a comprehensive MRSA colonization surveillance program. It is not intended to diagnose MRSA infection nor to guide or monitor treatment for MRSA infections. Test performance is not FDA approved in patients less than 45 years old. Performed at [facility], [place] [address] [place], [place] [address]      Labs:   Basic Metabolic Panel: Recent Labs  Lab 05/13/22 0304 05/16/22 1547 05/18/22 0626 05/19/22 0648  NA 131* 133* 132* 133*  K 4.0 4.7 4.4 4.5  CL 102 104 103 102  CO2 20* 21* 23 22  GLUCOSE 127* 144* 141* 125*  BUN 19 19 21 $ 24*  CREATININE 1.32* 1.64* 1.55* 1.54*  CALCIUM 10.4* 10.6* 10.7* 10.8*  MG  --  1.9  --   --    Liver Function Tests: Recent Labs  Lab 05/16/22 1547  AST 25  ALT 22  ALKPHOS 50  BILITOT 0.3  PROT 7.6  ALBUMIN 3.9    CBC: Recent Labs  Lab 05/16/22 1547 05/18/22 0626  WBC 7.3 6.0  NEUTROABS 3.2  --   HGB 13.6 13.3  HCT 41.7 40.3  MCV 82.7 80.4  PLT 141* 135*     CBG: Recent Labs  Lab 05/18/22 0615 05/18/22 1220 05/18/22 1719 05/18/22 2153 05/19/22 0615  GLUCAP 155* 182* 176* 167* 148*     IMAGING STUDIES CT CHEST ABDOMEN PELVIS W CONTRAST  Result Date: 05/17/2022 CLINICAL DATA:  Assessment for occult malignancy. History of diabetes and hypertension. EXAM: CT CHEST, ABDOMEN, AND PELVIS WITH CONTRAST TECHNIQUE: Multidetector CT imaging of the chest, abdomen and pelvis was performed following the standard protocol during bolus administration of intravenous contrast. RADIATION DOSE REDUCTION: This exam was performed according to the departmental dose-optimization program which includes automated exposure control, adjustment of the mA and/or kV according to patient size and/or use of iterative reconstruction technique. CONTRAST:  179m OMNIPAQUE IOHEXOL 350 MG/ML SOLN COMPARISON:  Abdominopelvic CT 01/12/2022. Chest radiographs 05/16/2022.  FINDINGS: CT CHEST FINDINGS Cardiovascular: No acute vascular findings. Mild atherosclerosis of the aorta, great vessels and coronary arteries. There is a small anterior pericardial effusion versus pericardial thickening. The heart size is normal. Mediastinum/Nodes: There are no enlarged mediastinal, hilar or axillary lymph nodes. Small mediastinal and axillary lymph nodes are noted bilaterally. The thyroid gland, trachea and esophagus demonstrate no significant findings. Lungs/Pleura: No pleural effusion or pneumothorax. No suspicious pulmonary nodules. Musculoskeletal/Chest wall: No chest wall mass or suspicious osseous findings. Mild spondylosis. CT ABDOMEN AND PELVIS FINDINGS Hepatobiliary: The liver is normal in density without suspicious focal abnormality. No evidence of gallstones, gallbladder wall thickening or biliary dilatation. Pancreas: Unremarkable. No pancreatic ductal dilatation or surrounding inflammatory changes. Spleen: Normal in size without focal abnormality. Adrenals/Urinary Tract: Both adrenal glands appear normal. No evidence of urinary tract calculus, suspicious renal lesion or hydronephrosis. The bladder appears normal for its degree of distention. Stomach/Bowel: No enteric contrast administered. The stomach appears unremarkable for its degree of distension. No evidence of bowel wall thickening, distention or surrounding inflammatory change. The appendix appears normal. Vascular/Lymphatic: There are no enlarged abdominal or pelvic lymph nodes. Aortic and branch vessel atherosclerosis  without evidence of aneurysm or large vessel occlusion. Reproductive: The prostate gland and seminal vesicles appear unremarkable. Other: No evidence of abdominal wall mass or hernia. No ascites. Musculoskeletal: No acute osseous findings. Multilevel lumbar spondylosis with chronic severe foraminal narrowing bilaterally at L5-S1. Probable central canal stenosis in the lower lumbar spine. IMPRESSION: 1. No acute  findings or evidence of malignancy within the chest, abdomen or pelvis. 2. Small anterior pericardial effusion versus pericardial thickening. 3. Multilevel lumbar spondylosis with chronic severe foraminal narrowing bilaterally at L5-S1. 4.  Aortic Atherosclerosis (ICD10-I70.0). Electronically Signed   By: Richardean Sale M.D.   On: 05/17/2022 10:38   MR BRAIN WO CONTRAST  Result Date: 05/17/2022 CLINICAL DATA:  Stroke suspected, increased bilateral leg weakness EXAM: MRI HEAD WITHOUT CONTRAST TECHNIQUE: Multiplanar, multiecho pulse sequences of the brain and surrounding structures were obtained without intravenous contrast. COMPARISON:  05/13/2021 FINDINGS: Brain: New area of restricted diffusion with ADC correlate in the left pons (series 2, images 15-17 and series 3, image 17), consistent with acute infarct. Other foci of mildly increased signal on diffusion-weighted imaging bilateral occipital lobes and left parietal lobe were present on the prior exam, likely subacute infarcts. No acute hemorrhage, mass, mass effect, or midline shift. No hydrocephalus or extra-axial collection. T2 hyperintense signal in the periventricular white matter, likely the sequela of moderate chronic small vessel ischemic disease. Remote lacunar infarcts in the right greater than left basal ganglia. Vascular: Normal arterial flow voids. Skull and upper cervical spine: Normal marrow signal. Sinuses/Orbits: No acute finding. Other: Trace fluid in right mastoid air cells. IMPRESSION: 1. 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 2. Redemonstrated subacute infarcts in the bilateral occipital lobes and left parietal lobe. These results were called by telephone at the time of interpretation on 05/16/2022 at 11:59 pm to provider POLLINA, who verbally acknowledged these results. Electronically Signed   By: Merilyn Baba M.D.   On: 05/17/2022 00:02   CT Head Wo Contrast  Result Date: 05/16/2022 CLINICAL DATA:  Recent stroke with increased  bilateral leg weakness unable to stand EXAM: CT HEAD WITHOUT CONTRAST TECHNIQUE: Contiguous axial images were obtained from the base of the skull through the vertex without intravenous contrast. RADIATION DOSE REDUCTION: This exam was performed according to the departmental dose-optimization program which includes automated exposure control, adjustment of the mA and/or kV according to patient size and/or use of iterative reconstruction technique. COMPARISON:  CT 05/12/2022, MRI 05/13/2022 FINDINGS: Brain: No acute territorial infarction, hemorrhage, or intracranial mass. Atrophy and chronic small vessel ischemic changes of the white matter. 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 Recently demonstrated small parietooccipital cortical infarcts on MRI are not well seen by CT. The ventricles are stable in size. Vascular: No hyperdense vessels.  Carotid vascular calcification Skull: Normal. Negative for fracture or focal lesion. Sinuses/Orbits: No acute finding. Other: None IMPRESSION: 1. No CT evidence for acute intracranial abnormality. 2. Atrophy and chronic small vessel ischemic changes of the white matter. 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 Recently demonstrated small cortical infarcts on MRI are not well seen by CT Electronically Signed   By: Donavan Foil M.D.   On: 05/16/2022 17:30   DG Chest 1 View  Result Date: 05/16/2022 CLINICAL DATA:  Pt having leg and arm weakness, feeling overall weak since yesterday - hx of stroke, htn, diabetes EXAM: CHEST  1 VIEW COMPARISON:  03/06/2020 FINDINGS: Lungs are clear. Heart size and mediastinal contours are within  normal limits. Aortic Atherosclerosis (ICD10-170.0). No effusion. Visualized bones unremarkable. IMPRESSION: No acute cardiopulmonary disease. Electronically Signed   By: Lucrezia Europe M.D.   On: 05/16/2022 16:12   VAS [place] LOWER EXTREMITY VENOUS (DVT)  Result Date: 05/13/2022  Lower  Venous DVT Study Patient Name:  Austin Mercer.  Date of Exam:   05/13/2022 Medical Rec #: [id number]         Accession #:    [id number] Date of Birth: 05-Feb-1951         Patient Gender: M Patient Age:   66 years Exam Location:  [facility] Procedure:      VAS [place] LOWER EXTREMITY VENOUS (DVT) Referring Phys: Janine Ores --------------------------------------------------------------------------------  Indications: Stroke.  Limitations: Body habitus and poor ultrasound/tissue interface. Comparison Study: No previous exams Performing Technologist: Jody Hill RVT, RDMS  Examination Guidelines: A complete evaluation includes B-mode imaging, spectral Doppler, color Doppler, and power Doppler as needed of all accessible portions of each vessel. Bilateral testing is considered an integral part of a complete examination. Limited examinations for reoccurring indications may be performed as noted. The reflux portion of the exam is performed with the patient in reverse Trendelenburg.  +---------+---------------+---------+-----------+----------+-------------------+ RIGHT    CompressibilityPhasicitySpontaneityPropertiesThrombus Aging      +---------+---------------+---------+-----------+----------+-------------------+ CFV      Full           Yes      Yes                                      +---------+---------------+---------+-----------+----------+-------------------+ SFJ      Full                                                             +---------+---------------+---------+-----------+----------+-------------------+ FV Prox  Full           Yes      Yes                                      +---------+---------------+---------+-----------+----------+-------------------+ FV Mid   Full           Yes      Yes                                      +---------+---------------+---------+-----------+----------+-------------------+ FV DistalFull           Yes      Yes                                       +---------+---------------+---------+-----------+----------+-------------------+ PFV      Full                                                             +---------+---------------+---------+-----------+----------+-------------------+ POP      Full  Yes      Yes                                      +---------+---------------+---------+-----------+----------+-------------------+ PTV      Full                                                             +---------+---------------+---------+-----------+----------+-------------------+ PERO     Full                                         Not well visualized +---------+---------------+---------+-----------+----------+-------------------+   +---------+---------------+---------+-----------+----------+--------------+ LEFT     CompressibilityPhasicitySpontaneityPropertiesThrombus Aging +---------+---------------+---------+-----------+----------+--------------+ CFV      Full           Yes      Yes                                 +---------+---------------+---------+-----------+----------+--------------+ SFJ      Full                                                        +---------+---------------+---------+-----------+----------+--------------+ FV Prox  Full           Yes      Yes                                 +---------+---------------+---------+-----------+----------+--------------+ FV Mid   Full           Yes      Yes                                 +---------+---------------+---------+-----------+----------+--------------+ FV DistalFull           Yes      Yes                                 +---------+---------------+---------+-----------+----------+--------------+ PFV      Full                                                        +---------+---------------+---------+-----------+----------+--------------+ POP      Full           Yes      Yes                                  +---------+---------------+---------+-----------+----------+--------------+ PTV      Full                                                        +---------+---------------+---------+-----------+----------+--------------+  PERO                                                  Not visualized +---------+---------------+---------+-----------+----------+--------------+   Left Technical Findings: Not visualized segments include Peroneal veins.   Summary: BILATERAL: - No evidence of deep vein thrombosis seen in the lower extremities, bilaterally. -No evidence of popliteal cyst, bilaterally.   *See table(s) above for measurements and observations. Electronically signed by Harold Barban MD on 05/13/2022 at 4:51:38 PM.    Final    MR BRAIN WO CONTRAST  Result Date: 05/13/2022 CLINICAL DATA:  Stroke, follow up. EXAM: MRI HEAD WITHOUT CONTRAST TECHNIQUE: Multiplanar, multiecho pulse sequences of the brain and surrounding structures were obtained without intravenous contrast. COMPARISON:  Head CT 05/12/2022 and MRI 05/11/2022 FINDINGS: At the request of the inpatient stroke team, only axial and coronal diffusion weighted imaging was performed. There are a few subcentimeter acute bilateral parieto-occipital cortical infarcts which were not present on the recent prior MRI. Chronic small vessel ischemia is again noted including chronic lacunar infarcts in the bilateral basal ganglia and deep white matter, more fully evaluated on the recent complete MRI. No intracranial mass effect or extra-axial fluid collection is evident on this abbreviated examination. IMPRESSION: Small acute bilateral parieto-occipital cortical infarcts. Electronically Signed   By: Logan Bores M.D.   On: 05/13/2022 11:43   CT HEAD WO CONTRAST (5MM)  Result Date: 05/12/2022 CLINICAL DATA:  Stroke, follow-up. EXAM: CT HEAD WITHOUT CONTRAST TECHNIQUE: Contiguous axial images were obtained from the base of the skull through the  vertex without intravenous contrast. RADIATION DOSE REDUCTION: This exam was performed according to the departmental dose-optimization program which includes automated exposure control, adjustment of the mA and/or kV according to patient size and/or use of iterative reconstruction technique. COMPARISON:  Head CT 05/11/2022.  MRI brain 05/11/2022. FINDINGS: Brain: No acute intracranial hemorrhage. Unchanged findings of chronic small-vessel disease with old lacunar infarcts in the bilateral basal ganglia, right external capsule/corona radiata and right internal capsule. Gray-white differentiation is otherwise preserved. No hydrocephalus or extra-axial collection. Vascular: No hyperdense vessel or unexpected calcification. Skull: No calvarial fracture or suspicious bone lesion. Skull base is unremarkable. Sinuses/Orbits: Unremarkable. Other: None. IMPRESSION: 1. No acute intracranial abnormality. 2. Unchanged findings of chronic small-vessel disease. Electronically Signed   By: Emmit Alexanders M.D.   On: 05/12/2022 08:53   ECHOCARDIOGRAM COMPLETE  Result Date: 05/11/2022    ECHOCARDIOGRAM REPORT   Patient Name:   Austin Mercer. Date of Exam: 05/11/2022 Medical Rec #:  [id number]        Height:       73.0 in Accession #:    [id number]       Weight:       360.0 lb Date of Birth:  10/26/1950        BSA:          2.762 m Patient Age:    71 years         BP:           195/104 mmHg Patient Gender: M                HR:           60 bpm. Exam Location:  Inpatient Procedure: 2D Echo, Cardiac Doppler and Color Doppler Indications:    Stroke  History:  Patient has no prior history of Echocardiogram examinations.                 Abnormal ECG, Stroke, Arrythmias:AV block,                 Signs/Symptoms:Dizziness/Lightheadedness; Risk                 Factors:Hypertension and Diabetes. ETOH.  Sonographer:    Roseanna Rainbow RDCS Referring Phys: Amie Portland  Sonographer Comments: Technically difficult study due to poor echo windows  and patient is obese. Image acquisition challenging due to patient body habitus. IMPRESSIONS  1. Left ventricular ejection fraction, by estimation, is 55 to 60%. The left ventricle has normal function. The left ventricle has no regional wall motion abnormalities. There is moderate left ventricular hypertrophy. Left ventricular diastolic parameters are consistent with Grade I diastolic dysfunction (impaired relaxation).  2. Right ventricular systolic function is normal. The right ventricular size is normal.  3. The mitral valve is normal in structure. Mild mitral valve regurgitation. No evidence of mitral stenosis.  4. The aortic valve has an indeterminant number of cusps. Aortic valve regurgitation is moderate. Mild aortic valve stenosis.  5. Aortic dilatation noted. There is borderline dilatation of the aortic root, measuring 39 mm.  6. The inferior vena cava is normal in size with greater than 50% respiratory variability, suggesting right atrial pressure of 3 mmHg. Comparison(s): No prior Echocardiogram. FINDINGS  Left Ventricle: Left ventricular ejection fraction, by estimation, is 55 to 60%. The left ventricle has normal function. The left ventricle has no regional wall motion abnormalities. The left ventricular internal cavity size was normal in size. There is  moderate left ventricular hypertrophy. Left ventricular diastolic parameters are consistent with Grade I diastolic dysfunction (impaired relaxation). Right Ventricle: The right ventricular size is normal. Right ventricular systolic function is normal. Left Atrium: Left atrial size was normal in size. Right Atrium: Right atrial size was normal in size. Pericardium: Trivial pericardial effusion is present. Mitral Valve: The mitral valve is normal in structure. Mild mitral valve regurgitation. No evidence of mitral valve stenosis. Tricuspid Valve: The tricuspid valve is normal in structure. Tricuspid valve regurgitation is trivial. No evidence of tricuspid  stenosis. Aortic Valve: The aortic valve has an indeterminant number of cusps. Aortic valve regurgitation is moderate. Aortic regurgitation PHT measures 709 msec. Mild aortic stenosis is present. Aortic valve mean gradient measures 16.0 mmHg. Aortic valve peak gradient measures 30.6 mmHg. Aortic valve area, by VTI measures 2.89 cm. Pulmonic Valve: The pulmonic valve was not well visualized. Pulmonic valve regurgitation is not visualized. No evidence of pulmonic stenosis. Aorta: Aortic dilatation noted. There is borderline dilatation of the aortic root, measuring 39 mm. Venous: The inferior vena cava is normal in size with greater than 50% respiratory variability, suggesting right atrial pressure of 3 mmHg. IAS/Shunts: No atrial level shunt detected by color flow Doppler.  LEFT VENTRICLE PLAX 2D LVIDd:         5.10 cm      Diastology LVIDs:         3.30 cm      LV e' medial:    5.98 cm/s LV PW:         1.40 cm      LV E/e' medial:  12.5 LV IVS:        1.80 cm      LV e' lateral:   6.96 cm/s LVOT diam:     2.80 cm  LV E/e' lateral: 10.7 LV SV:         166 LV SV Index:   60 LVOT Area:     6.16 cm  LV Volumes (MOD) LV vol d, MOD A2C: 229.0 ml LV vol d, MOD A4C: 176.0 ml LV vol s, MOD A2C: 71.5 ml LV vol s, MOD A4C: 72.7 ml LV SV MOD A2C:     157.5 ml LV SV MOD A4C:     176.0 ml LV SV MOD BP:      140.8 ml RIGHT VENTRICLE             IVC RV S prime:     16.90 cm/s  IVC diam: 2.10 cm TAPSE (M-mode): 1.9 cm LEFT ATRIUM             Index        RIGHT ATRIUM           Index LA diam:        3.60 cm 1.30 cm/m   RA Area:     12.50 cm LA Vol (A2C):   78.0 ml 28.24 ml/m  RA Volume:   25.50 ml  9.23 ml/m LA Vol (A4C):   60.7 ml 21.98 ml/m LA Biplane Vol: 69.9 ml 25.31 ml/m  AORTIC VALVE AV Area (Vmax):    2.83 cm AV Area (Vmean):   2.74 cm AV Area (VTI):     2.89 cm AV Vmax:           276.67 cm/s AV Vmean:          182.667 cm/s AV VTI:            0.573 m AV Peak Grad:      30.6 mmHg AV Mean Grad:      16.0 mmHg LVOT  Vmax:         127.00 cm/s LVOT Vmean:        81.400 cm/s LVOT VTI:          0.269 m LVOT/AV VTI ratio: 0.47 AI PHT:            709 msec  AORTA Ao Root diam: 3.90 cm Ao Asc diam:  3.80 cm MITRAL VALVE MV Area (PHT): 2.80 cm     SHUNTS MV Decel Time: 271 msec     Systemic VTI:  0.27 m MV E velocity: 74.80 cm/s   Systemic Diam: 2.80 cm MV A velocity: 101.15 cm/s MV E/A ratio:  0.74 Kirk Ruths MD Electronically signed by Kirk Ruths MD Signature Date/Time: 05/11/2022/1:48:03 PM    Final    EEG adult  Result Date: 05/11/2022 Lora Havens, MD     05/11/2022 12:16 PM Patient Name: Austin Mercer. MRN: [id number] Epilepsy Attending: Lora Havens Referring Physician/Provider: August Albino, NP Date: 05/11/2022 Duration: 23.38 mins Patient history: 72 year old male who initially presented with right-sided numbness, tingling and dizziness. He suddenly became globally aphasic, had worsening right-sided weakness, right-sided neglect and left gaze preference.  EEG to evaluate for seizure. Level of alertness: Awake,asleep AEDs during EEG study: None Technical aspects: This EEG study was done with scalp electrodes positioned according to the 10-20 International system of electrode placement. Electrical activity was reviewed with band pass filter of 1-70Hz$ , sensitivity of 7 uV/mm, display speed of 56m/sec with a 60Hz$  notched filter applied as appropriate. EEG data were recorded continuously and digitally stored.  Video monitoring was available and reviewed as appropriate. Description: The posterior dominant rhythm consists of 8 Hz activity of  moderate voltage (25-35 uV) seen predominantly in posterior head regions, symmetric and reactive to eye opening and eye closing. Sleep was characterized by vertex waves, sleep spindles (12 to 14 Hz), maximal frontocentral region.  EEG showed continuous generalized 3 to 7 Hz theta-delta slowing. Hyperventilation and photic stimulation were not performed.   ABNORMALITY -  Continuous slow, generalized IMPRESSION: This study is suggestive of moderate diffuse encephalopathy, nonspecific etiology. No seizures or epileptiform discharges were seen throughout the recording. Lora Havens   MR BRAIN WO CONTRAST  Result Date: 05/11/2022 CLINICAL DATA:  72 year old male code stroke presentation status post TNK for right side deficits. EXAM: MRI HEAD WITHOUT CONTRAST TECHNIQUE: Multiplanar, multiecho pulse sequences of the brain and surrounding structures were obtained without intravenous contrast. COMPARISON:  CTA head and neck, head CTs today. FINDINGS: Brain: Study is mildly degraded by motion artifact despite repeated imaging attempts. No convincing diffusion restriction. 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 Mild midbrain Wallerian degeneration. Mild contralateral left deep gray nuclei chronic small vessel disease. Scattered mild additional bilateral white matter T2 and FLAIR hyperintensity. No cortical encephalomalacia or chronic cerebral blood products identified. No midline shift, mass effect, evidence of mass lesion, ventriculomegaly, extra-axial collection or acute intracranial hemorrhage. Cervicomedullary junction and pituitary are within normal limits. Vascular: Major intracranial vascular flow voids are preserved. Skull and upper cervical spine: Negative. Sinuses/Orbits: Coronal T2 weighted imaging not provided. Orbit motion artifact. Paranasal sinuses and mastoids are stable and well aerated. Other: Grossly negative visible internal auditory structures. IMPRESSION: 1. Intermittently motion degraded exam. No acute intracranial abnormality identified. 2. 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 Electronically Signed   By: Genevie Ann M.D.   On: 05/11/2022 10:42   CT HEAD WO CONTRAST (5MM)  Result Date: 05/11/2022 CLINICAL DATA:  Hypertension EXAM: CT HEAD WITHOUT CONTRAST TECHNIQUE:  Contiguous axial images were obtained from the base of the skull through the vertex without intravenous contrast. RADIATION DOSE REDUCTION: This exam was performed according to the departmental dose-optimization program which includes automated exposure control, adjustment of the mA and/or kV according to patient size and/or use of iterative reconstruction technique. COMPARISON:  Same day CT at 0902 hours FINDINGS: Brain: No evidence of acute infarction, hemorrhage, hydrocephalus, extra-axial collection or mass lesion/mass effect. Moderate low-density changes within the periventricular and subcortical white matter compatible with chronic microvascular ischemic change. Mild diffuse cerebral volume loss. Vascular: Atherosclerotic calcifications involving the large vessels of the skull base. No unexpected hyperdense vessel. Skull: Normal. Negative for fracture or focal lesion. Sinuses/Orbits: No acute finding. Other: None. IMPRESSION: No acute intracranial abnormality. No interval change from the recently performed study. Electronically Signed   By: Davina Poke D.O.   On: 05/11/2022 10:01   CT ANGIO HEAD NECK W WO CM (CODE STROKE)  Result Date: 05/11/2022 CLINICAL DATA:  Code stroke. 72 year old male right side deficit. Status post TNK. EXAM: CT ANGIOGRAPHY HEAD AND NECK TECHNIQUE: Multidetector CT imaging of the head and neck was performed using the standard protocol during bolus administration of intravenous contrast. Multiplanar CT image reconstructions and MIPs were obtained to evaluate the vascular anatomy. Carotid stenosis measurements (when applicable) are obtained utilizing NASCET criteria, using the distal internal carotid diameter as the denominator. RADIATION DOSE REDUCTION: This exam was performed according to the departmental dose-optimization program which includes automated exposure control, adjustment of the mA and/or kV according to patient size and/or use of iterative reconstruction technique.  CONTRAST:  83m OMNIPAQUE IOHEXOL 350 MG/ML SOLN COMPARISON:  Plain head CT this morning. FINDINGS: CTA NECK Skeleton: Widespread cervical spine degeneration, bulky disc and endplate disease. No acute osseous abnormality identified. Upper chest: Negative lung apices. Visible superior mediastinal lymph nodes are within normal limits. Other neck: Retropharyngeal course of both carotid arteries in the neck. Aortic arch: Calcified aortic atherosclerosis. Bovine arch configuration. Right carotid system: No brachiocephalic artery plaque or stenosis. Tortuous right CCA origin without stenosis. Retropharyngeal right CCA and right carotid bifurcation with only mild right ICA plaque and no stenosis. Left carotid system: Bovine left CCA origin with no plaque or stenosis. Similar tortuosity and retropharyngeal course with only mild calcified plaque at the bifurcation more affecting the ECA, no stenosis. Vertebral arteries: Calcified plaque and tortuosity of the right subclavian artery origin without significant stenosis. Right vertebral artery origin is patent without significant stenosis. Right vertebral artery is patent to the skull base with no significant plaque or stenosis. Proximal left subclavian artery and left vertebral artery origin are patent with no significant plaque or stenosis. Tortuous left V1 segment. Codominant left vertebral arteries patent to the skull base with no significant stenosis. CTA HEAD Posterior circulation: Codominant V4 segments and vertebrobasilar junction are patent with no significant plaque or stenosis. Bilateral AICA appear dominant, patent. Basilar artery is patent although with long segment mild proximal and mid basilar stenosis, series 10, image 32. Mild fusiform enlargement of the basilar tip. Basilar tip, SCA and left PCA origins are patent. Fetal type right PCA origin with atherosclerotic and stenotic right posterior communicating artery. Furthermore, the right PCA P2 segment is  irregular and stenotic. See series 8, image 19. Distal right PCA branches remain patent. Contralateral up to moderate left PCA origin irregularity and stenosis. Superimposed moderate distal left P2 segment stenosis, and mild-to-moderate P3 superior division stenosis. Anterior circulation: Both ICA siphons are patent. Up to moderate calcified left siphon plaque but no significant left siphon stenosis. Similar moderate right siphon plaque, but only mild supraclinoid right siphon stenosis. Patent carotid termini, MCA and ACA origins. Mild to moderate bilateral A1 segment irregularity and stenosis slightly greater on the right (series 10, image 30). Diminutive or absent anterior communicating artery. There is moderate to severe left ACA A2 segment stenosis on series 12, image 23 but the distal ACA branches appear to remain patent. Up to moderate distal right ACA branch irregularity and stenosis on series 12, image 23 also. Left MCA M1 segment is mildly irregular but patent to the bifurcation without significant stenosis. No left MCA branch occlusion is identified. Mild left MCA branch irregularity. Contralateral right MCA M1 is mildly tortuous and irregular without stenosis. Patent right MCA bifurcation. Mild M2 but moderate posterior right M3 or M4 branch irregularity and stenosis on series 12, image 15. Venous sinuses: Patent. Anatomic variants: Bovine arch configuration. Fetal type right PCA origin. Review of the MIP images confirms the above findings IMPRESSION: 1. Negative for large vessel occlusion. But Positive for advanced and widespread intracranial atherosclerosis: - highly stenotic Right fetal PCA. - moderate tandem stenoses of the contralateral Left PCA. - mild bilateral ACA A1 stenosis, and moderate to severe stenosis Left ACA A2 and right A3. - up to moderate Right MCA M3/4 branch stenoses. - mild stenoses of the Basilar artery, ICA siphons. 2.  Aortic Atherosclerosis (ICD10-I70.0). Salient findings Study  discussed by telephone with Dr. ARory Percyon 05/11/2022 at 09:35. At 0924 hours Electronically Signed   By: HGenevie AnnM.D.   On: 05/11/2022 09:37  CT HEAD CODE STROKE WO CONTRAST  Result Date: 05/11/2022 CLINICAL DATA:  Code stroke.  72 year old male right side deficit. EXAM: CT HEAD WITHOUT CONTRAST TECHNIQUE: Contiguous axial images were obtained from the base of the skull through the vertex without intravenous contrast. RADIATION DOSE REDUCTION: This exam was performed according to the departmental dose-optimization program which includes automated exposure control, adjustment of the mA and/or kV according to patient size and/or use of iterative reconstruction technique. COMPARISON:  Head CT 12/30/2011. FINDINGS: Brain: Cerebral volume loss since 2013 along with substantially progressed but chronic appearing small vessel ischemia in the right basal ganglia and internal capsule. Chronic appearing cystic encephalomalacia there. Asymmetric additional patchy cerebral white matter hypodensity has increased but is age indeterminate. No superimposed acute cortically based infarct identified. Vascular: Calcified atherosclerosis at the skull base. Skull: Negative. Sinuses/Orbits: Visualized paranasal sinuses and mastoids are stable and well aerated. Other: Orbits and scalp appears stable and essentially negative, there is a tiny midline forehead benign scalp lipoma. ASPECTS [facility] Stroke Program Early CT Score) Total score (0-10 with 10 being normal): 10 IMPRESSION: 1. No acute cortically based infarct or acute intracranial hemorrhage identified. ASPECTS 10. 2. But substantially new/progressed small vessel disease since 2013, especially in the right basal ganglia and internal capsule. 3. These results were communicated to Dr. Rory Percy at 9:12 am on 05/11/2022 by text page via the [facility] messaging system. Electronically Signed   By: Genevie Ann M.D.   On: 05/11/2022 09:13    DISCHARGE EXAMINATION: Vitals:   05/18/22 1959 05/18/22  2329 05/19/22 0356 05/19/22 0738  BP: 136/61 114/79 (!) 150/70 128/73  Pulse: 61 68 (!) 56 63  Resp: 18 18 16 16  $ Temp: 98.2 F (36.8 C) 98.6 F (37 C) 98.3 F (36.8 C) 98 F (36.7 C)  TempSrc: Oral Oral Oral   SpO2: 98% 94% 99% 97%  Weight:      Height:       General appearance: Awake alert.  In no distress Resp: Clear to auscultation bilaterally.  Normal effort Cardio: S1-S2 is normal regular.  No S3-S4.  No rubs murmurs or bruit GI: Abdomen is soft.  Nontender nondistended.  Bowel sounds are present normal.  No masses organomegaly   DISPOSITION: SNF  Discharge Instructions     Ambulatory referral to Neurology   Complete by: As directed    An appointment is requested in approximately: 8 weeks   Call MD for:  difficulty breathing, headache or visual disturbances   Complete by: As directed    Call MD for:  extreme fatigue   Complete by: As directed    Call MD for:  persistant dizziness or light-headedness   Complete by: As directed    Call MD for:  persistant nausea and vomiting   Complete by: As directed    Call MD for:  severe uncontrolled pain   Complete by: As directed    Call MD for:  temperature >100.4   Complete by: As directed    Diet - low sodium heart healthy   Complete by: As directed    Diet Carb Modified   Complete by: As directed    Discharge instructions   Complete by: As directed    Please review instructions on the discharge summary.  You were cared for by a hospitalist during your hospital stay. If you have any questions about your discharge medications or the care you received while you were in the hospital after you are discharged, you can call the unit  and asked to speak with the hospitalist on call if the hospitalist that took care of you is not available. Once you are discharged, your primary care physician will handle any further medical issues. Please note that NO REFILLS for any discharge medications will be authorized once you are discharged,  as it is imperative that you return to your primary care physician (or establish a relationship with a primary care physician if you do not have one) for your aftercare needs so that they can reassess your need for medications and monitor your lab values. If you do not have a primary care physician, you can call [phone] for a physician referral.   Increase activity slowly   Complete by: As directed          Allergies as of 05/19/2022       Reactions   Codeine Itching   Latex Itching        Medication List     STOP taking these medications    amLODipine 10 MG tablet Commonly known as: NORVASC   tadalafil (PAH) 20 MG tablet Commonly known as: ADCIRCA       TAKE these medications    Accu-Chek Softclix Lancets lancets Use as directed to test blood sugar once daily.   aspirin EC 81 MG tablet Take 1 tablet (81 mg total) by mouth daily. Swallow whole. What changed: additional instructions   atorvastatin 20 MG tablet Commonly known as: LIPITOR Take 1 tablet (20 mg total) by mouth daily.   carvedilol 25 MG tablet Commonly known as: COREG TAKE 1 TABLET (25 MG TOTAL) BY MOUTH 2 (TWO) TIMES DAILY WITH A MEAL. What changed: how much to take   clopidogrel 75 MG tablet Commonly known as: PLAVIX Take 1 tablet (75 mg total) by mouth daily.   famotidine 20 MG tablet Commonly known as: PEPCID Take 1 tablet (20 mg total) by mouth daily.   Farxiga 10 MG Tabs tablet Generic drug: dapagliflozin propanediol Take 1 tablet (10 mg total) by mouth once daily before breakfast.   furosemide 40 MG tablet Commonly known as: LASIX TAKE 1 TABLET (40 MG TOTAL) BY MOUTH ONCE DAILY. What changed: how much to take   losartan 100 MG tablet Commonly known as: COZAAR Take 1 tablet (100 mg total) by mouth once daily.   metFORMIN 500 MG tablet Commonly known as: GLUCOPHAGE Take 1 tablet (500 mg total) by mouth 2 (two) times daily with a meal.   potassium chloride 8 MEQ  tablet Commonly known as: KLOR-CON Take 2 tablets (16 mEq total) by mouth 2 (two) times daily.   spironolactone 25 MG tablet Commonly known as: Aldactone Take 1 tablet (25 mg total) by mouth once daily.   thiamine 100 MG tablet Commonly known as: Vitamin B-1 Take 1 tablet (100 mg total) by mouth daily. Start taking on: May 20, 2022          Follow-up Information     [name], MD. Schedule an appointment as soon as possible for a visit in 1 week(s).   Specialty: Pulmonary Disease Why: post hospitalization follow up Contact information: [address] [place] [address] [phone]                 TOTAL DISCHARGE TIME: 87 minutes  [place]  [facility] Pager on [email or web address]  05/19/2022, 11:06 AM

## 2022-05-19 NOTE — TOC Progression Note (Addendum)
Transition of Care (TOC) - Progression Note    Patient Details  Name: Austin Mercer. MRN: [id number] Date of Birth: 25-Aug-1950  Transition of Care [facility]) CM/SW [place], [place] Phone Number: 05/19/2022, 10:35 AM  Clinical Narrative:   Patient has no bed offers at this time. CSW sent message to pending SNF requests to ask them to review patient. CSW to follow.  UPDATE 3:19 PM: CSW received bed offer for patient at [facility]. CSW met with patient to update and he is in agreement. CSW provided patient with letter to move to a first floor apartment, patient was appreciative. CSW requested initiation of insurance authorization, waiting approval. CSW to follow.   Expected Discharge Plan: [place] Barriers to Discharge: Continued Medical Work up, Ship broker  Expected Discharge Plan and Services     Post Acute Care Choice: [place] Living arrangements for the past 2 months: Apartment                                       Social Determinants of Health (SDOH) Interventions SDOH Screenings   Food Insecurity: No Food Insecurity (05/17/2022)  Housing: Low Risk  (05/17/2022)  Transportation Needs: No Transportation Needs (05/17/2022)  Utilities: Not At Risk (05/17/2022)  Depression (PHQ2-9): Low Risk  (03/02/2022)  Tobacco Use: Medium Risk (05/16/2022)    Readmission Risk Interventions     No data to display

## 2022-05-19 NOTE — Care Management Important Message (Signed)
Important Message  Patient Details  Name: Austin Mercer. MRN: [id number] Date of Birth: 1950/04/15   Medicare Important Message Given:  Yes     Drevon Plog 05/19/2022, 2:14 PM

## 2022-05-19 NOTE — Plan of Care (Signed)
  Problem: Education: Goal: Knowledge of disease or condition will improve Outcome: Progressing   Problem: Education: Goal: Knowledge of secondary prevention will improve (MUST DOCUMENT ALL) Outcome: Progressing   Problem: Education: Goal: Knowledge of patient specific risk factors will improve [place] N/A or DELETE if not current risk factor) Outcome: Progressing   Problem: Ischemic Stroke/TIA Tissue Perfusion: Goal: Complications of ischemic stroke/TIA will be minimized Outcome: Progressing   Problem: Self-Care: Goal: Ability to participate in self-care as condition permits will improve Outcome: Progressing   Problem: Nutrition: Goal: Risk of aspiration will decrease Outcome: Progressing   Problem: Nutrition: Goal: Dietary intake will improve Outcome: Progressing

## 2022-05-19 NOTE — Progress Notes (Signed)
Mobility Specialist: Progress Note   05/19/22 1502  Mobility  Activity Ambulated with assistance in hallway  Level of Assistance Contact guard assist, steadying assist  Assistive Device Front wheel walker  Distance Ambulated (ft) 150 ft  Activity Response Tolerated well  Mobility Referral Yes  $Mobility charge 1 Mobility   Pt received in the chair and agreeable to mobility. Mod I to stand and contact guard during ambulation. No c/o throughout. Verbal cues for upright posture and RW proximity as pt has tendency for anterior lean. Pt back to the chair after session with call bell and phone in reach.   [place] Dennette Faulconer Mobility Specialist Please contact via SecureChat or Rehab office at [phone]

## 2022-05-20 LAB — GLUCOSE, CAPILLARY
Glucose-Capillary: 127 mg/dL — ABNORMAL HIGH (ref 70–99)
Glucose-Capillary: 130 mg/dL — ABNORMAL HIGH (ref 70–99)
Glucose-Capillary: 146 mg/dL — ABNORMAL HIGH (ref 70–99)
Glucose-Capillary: 115 mg/dL — ABNORMAL HIGH (ref 70–99)
Glucose-Capillary: 162 mg/dL — ABNORMAL HIGH (ref 70–99)
Glucose-Capillary: 140 mg/dL — ABNORMAL HIGH (ref 70–99)

## 2022-05-20 MED ORDER — TRAMADOL HCL 50 MG PO TABS: 50.0000 mg | ORAL_TABLET | Freq: Two times a day (BID) | ORAL | 0 refills | Status: DC | PRN

## 2022-05-20 NOTE — Discharge Summary (Addendum)
[facility]  Physician Discharge Summary   Patient ID: Austin Mercer. MRN: [id number] DOB/AGE: 10/06/1950 72 y.o.  Admit date: 05/16/2022 Discharge date:   05/20/2022   PCP: [name], MD  DISCHARGE DIAGNOSES:    Acute ischemic stroke [facility])   Controlled type 2 diabetes mellitus without complication, without long-term current use of insulin ([place])   Essential hypertension   Class 3 severe obesity due to excess calories with serious comorbidity and body mass index (BMI) of 45.0 to 49.9 in adult [facility])   Hepatitis C virus infection cured after antiviral drug therapy   History of alcohol use disorder   Liver cirrhosis (HCC)   CVA (cerebral vascular accident) ([place])   RECOMMENDATIONS FOR OUTPATIENT FOLLOW UP: Please check CBC and basic metabolic panel in 3 to 4 days Ambulatory referral sent to neurology for outpatient follow-up   Home Health: SNF Equipment/Devices: None  CODE STATUS: Full code  DISCHARGE CONDITION: fair  Diet recommendation: Modified carbohydrate/heart healthy  INITIAL HISTORY:  72 y.o. male with history h/o obesity, diabetes mellitus type 2, hypertension who was recently admitted to this facility for CVA-bilateral embolic stroke s/p tnkase and discharged home with DAPT, presented with complaints of worsening right leg weakness and inability to walk.  MRI revealed new strokes.  He was hospitalized for further management.     Consultants: Neurology   Procedures: None    HOSPITAL COURSE:   Acute stroke Recently hospitalized for stroke and was discharged on dual antiplatelet treatment with aspirin and Plavix.  Presented again with new weakness especially in the right lower extremity.  MRI shows new stroke Neurology is following.  They are considering loop recorder placement.  May need to be done in the outpatient setting and will defer to neurology. Patient had extensive neurological workup during his admission recently for his previous  stroke. LDL is 59.  HbA1c 6.2. Patient is on aspirin and Plavix.  Also on statin. PT and OT evaluation.  SNF is recommended for short-term rehab.   Chronic systolic CHF/essential hypertension Patient does have bilateral lower extremity edema and is on diuretics at home.  Recent echocardiogram showed normal left ventricular ejection fraction. Continue with home medications.  He is noted to be on carvedilol, furosemide and spironolactone.  Also on ARB which can be resumed at discharge.  Renal function will need to be monitored closely.   Hyperlipidemia Continue with statin   Diabetes mellitus type 2 Continue with home occasions.   History of hepatitis C/liver cirrhosis He has been treated with hepatitis C with antivirals.  Seems to be stable. No concerns identified on CT scan.   Chronic alcohol use previously Last drink was 2 weeks prior to admission.  No withdrawal symptoms noted.  Continue with thiamine.   Elevated creatinine Creatinine noted to be elevated for the last few days including previous hospitalization.  No known history of chronic kidney disease but he could have Chronic kidney disease stage II-IIIa.  Will need further monitoring in the outpatient setting.   Morbid obesity Estimated body mass index is 45.61 kg/m as calculated from the following:   Height as of this encounter: 6' 1"$  (1.854 m).   Weight as of this encounter: 156.8 kg.    Patient is stable.  Okay for discharge to SNF when bed is available.  PERTINENT LABS:  The results of significant diagnostics from this hospitalization (including imaging, microbiology, ancillary and laboratory) are listed below for reference.    Microbiology: Recent Results (from the past 240  hour(s))  MRSA Next Gen by PCR, Nasal     Status: None   Collection Time: 05/11/22 10:54 AM   Specimen: Nasal Mucosa; Nasal Swab  Result Value Ref Range Status   MRSA by PCR Next Gen NOT DETECTED NOT DETECTED Final    Comment: (NOTE) The  GeneXpert MRSA Assay (FDA approved for NASAL specimens only), is one component of a comprehensive MRSA colonization surveillance program. It is not intended to diagnose MRSA infection nor to guide or monitor treatment for MRSA infections. Test performance is not FDA approved in patients less than 39 years old. Performed at [facility], [place] [address], [place], [place] [address]      Labs:   Basic Metabolic Panel: Recent Labs  Lab 05/16/22 1547 05/18/22 0626 05/19/22 0648  NA 133* 132* 133*  K 4.7 4.4 4.5  CL 104 103 102  CO2 21* 23 22  GLUCOSE 144* 141* 125*  BUN 19 21 24*  CREATININE 1.64* 1.55* 1.54*  CALCIUM 10.6* 10.7* 10.8*  MG 1.9  --   --    Liver Function Tests: Recent Labs  Lab 05/16/22 1547  AST 25  ALT 22  ALKPHOS 50  BILITOT 0.3  PROT 7.6  ALBUMIN 3.9    CBC: Recent Labs  Lab 05/16/22 1547 05/18/22 0626  WBC 7.3 6.0  NEUTROABS 3.2  --   HGB 13.6 13.3  HCT 41.7 40.3  MCV 82.7 80.4  PLT 141* 135*     CBG: Recent Labs  Lab 05/19/22 0615 05/19/22 1151 05/19/22 1707 05/19/22 2103 05/20/22 0616  GLUCAP 148* 127* 188* 130* 146*     IMAGING STUDIES CT CHEST ABDOMEN PELVIS W CONTRAST  Result Date: 05/17/2022 CLINICAL DATA:  Assessment for occult malignancy. History of diabetes and hypertension. EXAM: CT CHEST, ABDOMEN, AND PELVIS WITH CONTRAST TECHNIQUE: Multidetector CT imaging of the chest, abdomen and pelvis was performed following the standard protocol during bolus administration of intravenous contrast. RADIATION DOSE REDUCTION: This exam was performed according to the departmental dose-optimization program which includes automated exposure control, adjustment of the mA and/or kV according to patient size and/or use of iterative reconstruction technique. CONTRAST:  174m OMNIPAQUE IOHEXOL 350 MG/ML SOLN COMPARISON:  Abdominopelvic CT 01/12/2022. Chest radiographs 05/16/2022. FINDINGS: CT CHEST FINDINGS Cardiovascular: No acute  vascular findings. Mild atherosclerosis of the aorta, great vessels and coronary arteries. There is a small anterior pericardial effusion versus pericardial thickening. The heart size is normal. Mediastinum/Nodes: There are no enlarged mediastinal, hilar or axillary lymph nodes. Small mediastinal and axillary lymph nodes are noted bilaterally. The thyroid gland, trachea and esophagus demonstrate no significant findings. Lungs/Pleura: No pleural effusion or pneumothorax. No suspicious pulmonary nodules. Musculoskeletal/Chest wall: No chest wall mass or suspicious osseous findings. Mild spondylosis. CT ABDOMEN AND PELVIS FINDINGS Hepatobiliary: The liver is normal in density without suspicious focal abnormality. No evidence of gallstones, gallbladder wall thickening or biliary dilatation. Pancreas: Unremarkable. No pancreatic ductal dilatation or surrounding inflammatory changes. Spleen: Normal in size without focal abnormality. Adrenals/Urinary Tract: Both adrenal glands appear normal. No evidence of urinary tract calculus, suspicious renal lesion or hydronephrosis. The bladder appears normal for its degree of distention. Stomach/Bowel: No enteric contrast administered. The stomach appears unremarkable for its degree of distension. No evidence of bowel wall thickening, distention or surrounding inflammatory change. The appendix appears normal. Vascular/Lymphatic: There are no enlarged abdominal or pelvic lymph nodes. Aortic and branch vessel atherosclerosis without evidence of aneurysm or large vessel occlusion. Reproductive: The prostate gland and  seminal vesicles appear unremarkable. Other: No evidence of abdominal wall mass or hernia. No ascites. Musculoskeletal: No acute osseous findings. Multilevel lumbar spondylosis with chronic severe foraminal narrowing bilaterally at L5-S1. Probable central canal stenosis in the lower lumbar spine. IMPRESSION: 1. No acute findings or evidence of malignancy within the chest,  abdomen or pelvis. 2. Small anterior pericardial effusion versus pericardial thickening. 3. Multilevel lumbar spondylosis with chronic severe foraminal narrowing bilaterally at L5-S1. 4.  Aortic Atherosclerosis (ICD10-I70.0). Electronically Signed   By: Richardean Sale M.D.   On: 05/17/2022 10:38   MR BRAIN WO CONTRAST  Result Date: 05/17/2022 CLINICAL DATA:  Stroke suspected, increased bilateral leg weakness EXAM: MRI HEAD WITHOUT CONTRAST TECHNIQUE: Multiplanar, multiecho pulse sequences of the brain and surrounding structures were obtained without intravenous contrast. COMPARISON:  05/13/2021 FINDINGS: Brain: New area of restricted diffusion with ADC correlate in the left pons (series 2, images 15-17 and series 3, image 17), consistent with acute infarct. Other foci of mildly increased signal on diffusion-weighted imaging bilateral occipital lobes and left parietal lobe were present on the prior exam, likely subacute infarcts. No acute hemorrhage, mass, mass effect, or midline shift. No hydrocephalus or extra-axial collection. T2 hyperintense signal in the periventricular white matter, likely the sequela of moderate chronic small vessel ischemic disease. Remote lacunar infarcts in the right greater than left basal ganglia. Vascular: Normal arterial flow voids. Skull and upper cervical spine: Normal marrow signal. Sinuses/Orbits: No acute finding. Other: Trace fluid in right mastoid air cells. IMPRESSION: 1. 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 2. Redemonstrated subacute infarcts in the bilateral occipital lobes and left parietal lobe. These results were called by telephone at the time of interpretation on 05/16/2022 at 11:59 pm to provider POLLINA, who verbally acknowledged these results. Electronically Signed   By: Merilyn Baba M.D.   On: 05/17/2022 00:02   CT Head Wo Contrast  Result Date: 05/16/2022 CLINICAL DATA:  Recent stroke with increased bilateral leg weakness unable to stand EXAM: CT HEAD WITHOUT  CONTRAST TECHNIQUE: Contiguous axial images were obtained from the base of the skull through the vertex without intravenous contrast. RADIATION DOSE REDUCTION: This exam was performed according to the departmental dose-optimization program which includes automated exposure control, adjustment of the mA and/or kV according to patient size and/or use of iterative reconstruction technique. COMPARISON:  CT 05/12/2022, MRI 05/13/2022 FINDINGS: Brain: No acute territorial infarction, hemorrhage, or intracranial mass. Atrophy and chronic small vessel ischemic changes of the white matter. 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 Recently demonstrated small parietooccipital cortical infarcts on MRI are not well seen by CT. The ventricles are stable in size. Vascular: No hyperdense vessels.  Carotid vascular calcification Skull: Normal. Negative for fracture or focal lesion. Sinuses/Orbits: No acute finding. Other: None IMPRESSION: 1. No CT evidence for acute intracranial abnormality. 2. Atrophy and chronic small vessel ischemic changes of the white matter. 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 Recently demonstrated small cortical infarcts on MRI are not well seen by CT Electronically Signed   By: Donavan Foil M.D.   On: 05/16/2022 17:30   DG Chest 1 View  Result Date: 05/16/2022 CLINICAL DATA:  Pt having leg and arm weakness, feeling overall weak since yesterday - hx of stroke, htn, diabetes EXAM: CHEST  1 VIEW COMPARISON:  03/06/2020 FINDINGS: Lungs are clear. Heart size and mediastinal contours are within normal limits. Aortic Atherosclerosis (ICD10-170.0). No effusion. Visualized bones unremarkable. IMPRESSION: No acute  cardiopulmonary disease. Electronically Signed   By: Lucrezia Europe M.D.   On: 05/16/2022 16:12   VAS [place] LOWER EXTREMITY VENOUS (DVT)  Result Date: 05/13/2022  Lower Venous DVT Study Patient Name:  Austin Mercer.  Date of  Exam:   05/13/2022 Medical Rec #: [id number]         Accession #:    [id number] Date of Birth: 09-15-1950         Patient Gender: M Patient Age:   88 years Exam Location:  [facility] Procedure:      VAS [place] LOWER EXTREMITY VENOUS (DVT) Referring Phys: Janine Ores --------------------------------------------------------------------------------  Indications: Stroke.  Limitations: Body habitus and poor ultrasound/tissue interface. Comparison Study: No previous exams Performing Technologist: Jody Hill RVT, RDMS  Examination Guidelines: A complete evaluation includes B-mode imaging, spectral Doppler, color Doppler, and power Doppler as needed of all accessible portions of each vessel. Bilateral testing is considered an integral part of a complete examination. Limited examinations for reoccurring indications may be performed as noted. The reflux portion of the exam is performed with the patient in reverse Trendelenburg.  +---------+---------------+---------+-----------+----------+-------------------+ RIGHT    CompressibilityPhasicitySpontaneityPropertiesThrombus Aging      +---------+---------------+---------+-----------+----------+-------------------+ CFV      Full           Yes      Yes                                      +---------+---------------+---------+-----------+----------+-------------------+ SFJ      Full                                                             +---------+---------------+---------+-----------+----------+-------------------+ FV Prox  Full           Yes      Yes                                      +---------+---------------+---------+-----------+----------+-------------------+ FV Mid   Full           Yes      Yes                                      +---------+---------------+---------+-----------+----------+-------------------+ FV DistalFull           Yes      Yes                                       +---------+---------------+---------+-----------+----------+-------------------+ PFV      Full                                                             +---------+---------------+---------+-----------+----------+-------------------+ POP      Full           Yes  Yes                                      +---------+---------------+---------+-----------+----------+-------------------+ PTV      Full                                                             +---------+---------------+---------+-----------+----------+-------------------+ PERO     Full                                         Not well visualized +---------+---------------+---------+-----------+----------+-------------------+   +---------+---------------+---------+-----------+----------+--------------+ LEFT     CompressibilityPhasicitySpontaneityPropertiesThrombus Aging +---------+---------------+---------+-----------+----------+--------------+ CFV      Full           Yes      Yes                                 +---------+---------------+---------+-----------+----------+--------------+ SFJ      Full                                                        +---------+---------------+---------+-----------+----------+--------------+ FV Prox  Full           Yes      Yes                                 +---------+---------------+---------+-----------+----------+--------------+ FV Mid   Full           Yes      Yes                                 +---------+---------------+---------+-----------+----------+--------------+ FV DistalFull           Yes      Yes                                 +---------+---------------+---------+-----------+----------+--------------+ PFV      Full                                                        +---------+---------------+---------+-----------+----------+--------------+ POP      Full           Yes      Yes                                  +---------+---------------+---------+-----------+----------+--------------+ PTV      Full                                                        +---------+---------------+---------+-----------+----------+--------------+  PERO                                                  Not visualized +---------+---------------+---------+-----------+----------+--------------+   Left Technical Findings: Not visualized segments include Peroneal veins.   Summary: BILATERAL: - No evidence of deep vein thrombosis seen in the lower extremities, bilaterally. -No evidence of popliteal cyst, bilaterally.   *See table(s) above for measurements and observations. Electronically signed by Harold Barban MD on 05/13/2022 at 4:51:38 PM.    Final    MR BRAIN WO CONTRAST  Result Date: 05/13/2022 CLINICAL DATA:  Stroke, follow up. EXAM: MRI HEAD WITHOUT CONTRAST TECHNIQUE: Multiplanar, multiecho pulse sequences of the brain and surrounding structures were obtained without intravenous contrast. COMPARISON:  Head CT 05/12/2022 and MRI 05/11/2022 FINDINGS: At the request of the inpatient stroke team, only axial and coronal diffusion weighted imaging was performed. There are a few subcentimeter acute bilateral parieto-occipital cortical infarcts which were not present on the recent prior MRI. Chronic small vessel ischemia is again noted including chronic lacunar infarcts in the bilateral basal ganglia and deep white matter, more fully evaluated on the recent complete MRI. No intracranial mass effect or extra-axial fluid collection is evident on this abbreviated examination. IMPRESSION: Small acute bilateral parieto-occipital cortical infarcts. Electronically Signed   By: Logan Bores M.D.   On: 05/13/2022 11:43   CT HEAD WO CONTRAST (5MM)  Result Date: 05/12/2022 CLINICAL DATA:  Stroke, follow-up. EXAM: CT HEAD WITHOUT CONTRAST TECHNIQUE: Contiguous axial images were obtained from the base of the skull through the vertex without  intravenous contrast. RADIATION DOSE REDUCTION: This exam was performed according to the departmental dose-optimization program which includes automated exposure control, adjustment of the mA and/or kV according to patient size and/or use of iterative reconstruction technique. COMPARISON:  Head CT 05/11/2022.  MRI brain 05/11/2022. FINDINGS: Brain: No acute intracranial hemorrhage. Unchanged findings of chronic small-vessel disease with old lacunar infarcts in the bilateral basal ganglia, right external capsule/corona radiata and right internal capsule. Gray-white differentiation is otherwise preserved. No hydrocephalus or extra-axial collection. Vascular: No hyperdense vessel or unexpected calcification. Skull: No calvarial fracture or suspicious bone lesion. Skull base is unremarkable. Sinuses/Orbits: Unremarkable. Other: None. IMPRESSION: 1. No acute intracranial abnormality. 2. Unchanged findings of chronic small-vessel disease. Electronically Signed   By: Emmit Alexanders M.D.   On: 05/12/2022 08:53   ECHOCARDIOGRAM COMPLETE  Result Date: 05/11/2022    ECHOCARDIOGRAM REPORT   Patient Name:   Austin Mercer. Date of Exam: 05/11/2022 Medical Rec #:  [id number]        Height:       73.0 in Accession #:    [id number]       Weight:       360.0 lb Date of Birth:  [birth year]        BSA:          2.762 m Patient Age:    45 years         BP:           195/104 mmHg Patient Gender: M                HR:           60 bpm. Exam Location:  Inpatient Procedure: 2D Echo, Cardiac Doppler and Color Doppler Indications:    Stroke  History:  Patient has no prior history of Echocardiogram examinations.                 Abnormal ECG, Stroke, Arrythmias:AV block,                 Signs/Symptoms:Dizziness/Lightheadedness; Risk                 Factors:Hypertension and Diabetes. ETOH.  Sonographer:    Roseanna Rainbow RDCS Referring Phys: Amie Portland  Sonographer Comments: Technically difficult study due to poor echo windows and patient is  obese. Image acquisition challenging due to patient body habitus. IMPRESSIONS  1. Left ventricular ejection fraction, by estimation, is 55 to 60%. The left ventricle has normal function. The left ventricle has no regional wall motion abnormalities. There is moderate left ventricular hypertrophy. Left ventricular diastolic parameters are consistent with Grade I diastolic dysfunction (impaired relaxation).  2. Right ventricular systolic function is normal. The right ventricular size is normal.  3. The mitral valve is normal in structure. Mild mitral valve regurgitation. No evidence of mitral stenosis.  4. The aortic valve has an indeterminant number of cusps. Aortic valve regurgitation is moderate. Mild aortic valve stenosis.  5. Aortic dilatation noted. There is borderline dilatation of the aortic root, measuring 39 mm.  6. The inferior vena cava is normal in size with greater than 50% respiratory variability, suggesting right atrial pressure of 3 mmHg. Comparison(s): No prior Echocardiogram. FINDINGS  Left Ventricle: Left ventricular ejection fraction, by estimation, is 55 to 60%. The left ventricle has normal function. The left ventricle has no regional wall motion abnormalities. The left ventricular internal cavity size was normal in size. There is  moderate left ventricular hypertrophy. Left ventricular diastolic parameters are consistent with Grade I diastolic dysfunction (impaired relaxation). Right Ventricle: The right ventricular size is normal. Right ventricular systolic function is normal. Left Atrium: Left atrial size was normal in size. Right Atrium: Right atrial size was normal in size. Pericardium: Trivial pericardial effusion is present. Mitral Valve: The mitral valve is normal in structure. Mild mitral valve regurgitation. No evidence of mitral valve stenosis. Tricuspid Valve: The tricuspid valve is normal in structure. Tricuspid valve regurgitation is trivial. No evidence of tricuspid stenosis.  Aortic Valve: The aortic valve has an indeterminant number of cusps. Aortic valve regurgitation is moderate. Aortic regurgitation PHT measures 709 msec. Mild aortic stenosis is present. Aortic valve mean gradient measures 16.0 mmHg. Aortic valve peak gradient measures 30.6 mmHg. Aortic valve area, by VTI measures 2.89 cm. Pulmonic Valve: The pulmonic valve was not well visualized. Pulmonic valve regurgitation is not visualized. No evidence of pulmonic stenosis. Aorta: Aortic dilatation noted. There is borderline dilatation of the aortic root, measuring 39 mm. Venous: The inferior vena cava is normal in size with greater than 50% respiratory variability, suggesting right atrial pressure of 3 mmHg. IAS/Shunts: No atrial level shunt detected by color flow Doppler.  LEFT VENTRICLE PLAX 2D LVIDd:         5.10 cm      Diastology LVIDs:         3.30 cm      LV e' medial:    5.98 cm/s LV PW:         1.40 cm      LV E/e' medial:  12.5 LV IVS:        1.80 cm      LV e' lateral:   6.96 cm/s LVOT diam:     2.80 cm  LV E/e' lateral: 10.7 LV SV:         166 LV SV Index:   60 LVOT Area:     6.16 cm  LV Volumes (MOD) LV vol d, MOD A2C: 229.0 ml LV vol d, MOD A4C: 176.0 ml LV vol s, MOD A2C: 71.5 ml LV vol s, MOD A4C: 72.7 ml LV SV MOD A2C:     157.5 ml LV SV MOD A4C:     176.0 ml LV SV MOD BP:      140.8 ml RIGHT VENTRICLE             IVC RV S prime:     16.90 cm/s  IVC diam: 2.10 cm TAPSE (M-mode): 1.9 cm LEFT ATRIUM             Index        RIGHT ATRIUM           Index LA diam:        3.60 cm 1.30 cm/m   RA Area:     12.50 cm LA Vol (A2C):   78.0 ml 28.24 ml/m  RA Volume:   25.50 ml  9.23 ml/m LA Vol (A4C):   60.7 ml 21.98 ml/m LA Biplane Vol: 69.9 ml 25.31 ml/m  AORTIC VALVE AV Area (Vmax):    2.83 cm AV Area (Vmean):   2.74 cm AV Area (VTI):     2.89 cm AV Vmax:           276.67 cm/s AV Vmean:          182.667 cm/s AV VTI:            0.573 m AV Peak Grad:      30.6 mmHg AV Mean Grad:      16.0 mmHg LVOT Vmax:          127.00 cm/s LVOT Vmean:        81.400 cm/s LVOT VTI:          0.269 m LVOT/AV VTI ratio: 0.47 AI PHT:            709 msec  AORTA Ao Root diam: 3.90 cm Ao Asc diam:  3.80 cm MITRAL VALVE MV Area (PHT): 2.80 cm     SHUNTS MV Decel Time: 271 msec     Systemic VTI:  0.27 m MV E velocity: 74.80 cm/s   Systemic Diam: 2.80 cm MV A velocity: 101.15 cm/s MV E/A ratio:  0.74 Kirk Ruths MD Electronically signed by Kirk Ruths MD Signature Date/Time: 05/11/2022/1:48:03 PM    Final    EEG adult  Result Date: 05/11/2022 Lora Havens, MD     05/11/2022 12:16 PM Patient Name: Austin Mercer. MRN: [id number] Epilepsy Attending: Lora Havens Referring Physician/Provider: August Albino, NP Date: 05/11/2022 Duration: 23.38 mins Patient history: 72 year old male who initially presented with right-sided numbness, tingling and dizziness. He suddenly became globally aphasic, had worsening right-sided weakness, right-sided neglect and left gaze preference.  EEG to evaluate for seizure. Level of alertness: Awake,asleep AEDs during EEG study: None Technical aspects: This EEG study was done with scalp electrodes positioned according to the 10-20 International system of electrode placement. Electrical activity was reviewed with band pass filter of 1-70Hz$ , sensitivity of 7 uV/mm, display speed of 36m/sec with a 60Hz$  notched filter applied as appropriate. EEG data were recorded continuously and digitally stored.  Video monitoring was available and reviewed as appropriate. Description: The posterior dominant rhythm consists of 8 Hz activity of  moderate voltage (25-35 uV) seen predominantly in posterior head regions, symmetric and reactive to eye opening and eye closing. Sleep was characterized by vertex waves, sleep spindles (12 to 14 Hz), maximal frontocentral region.  EEG showed continuous generalized 3 to 7 Hz theta-delta slowing. Hyperventilation and photic stimulation were not performed.   ABNORMALITY - Continuous slow,  generalized IMPRESSION: This study is suggestive of moderate diffuse encephalopathy, nonspecific etiology. No seizures or epileptiform discharges were seen throughout the recording. Lora Havens   MR BRAIN WO CONTRAST  Result Date: 05/11/2022 CLINICAL DATA:  72 year old male code stroke presentation status post TNK for right side deficits. EXAM: MRI HEAD WITHOUT CONTRAST TECHNIQUE: Multiplanar, multiecho pulse sequences of the brain and surrounding structures were obtained without intravenous contrast. COMPARISON:  CTA head and neck, head CTs today. FINDINGS: Brain: Study is mildly degraded by motion artifact despite repeated imaging attempts. No convincing diffusion restriction. 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 Mild midbrain Wallerian degeneration. Mild contralateral left deep gray nuclei chronic small vessel disease. Scattered mild additional bilateral white matter T2 and FLAIR hyperintensity. No cortical encephalomalacia or chronic cerebral blood products identified. No midline shift, mass effect, evidence of mass lesion, ventriculomegaly, extra-axial collection or acute intracranial hemorrhage. Cervicomedullary junction and pituitary are within normal limits. Vascular: Major intracranial vascular flow voids are preserved. Skull and upper cervical spine: Negative. Sinuses/Orbits: Coronal T2 weighted imaging not provided. Orbit motion artifact. Paranasal sinuses and mastoids are stable and well aerated. Other: Grossly negative visible internal auditory structures. IMPRESSION: 1. Intermittently motion degraded exam. No acute intracranial abnormality identified. 2. 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 Electronically Signed   By: Genevie Ann M.D.   On: 05/11/2022 10:42   CT HEAD WO CONTRAST (5MM)  Result Date: 05/11/2022 CLINICAL DATA:  Hypertension EXAM: CT HEAD WITHOUT CONTRAST TECHNIQUE: Contiguous axial  images were obtained from the base of the skull through the vertex without intravenous contrast. RADIATION DOSE REDUCTION: This exam was performed according to the departmental dose-optimization program which includes automated exposure control, adjustment of the mA and/or kV according to patient size and/or use of iterative reconstruction technique. COMPARISON:  Same day CT at 0902 hours FINDINGS: Brain: No evidence of acute infarction, hemorrhage, hydrocephalus, extra-axial collection or mass lesion/mass effect. Moderate low-density changes within the periventricular and subcortical white matter compatible with chronic microvascular ischemic change. Mild diffuse cerebral volume loss. Vascular: Atherosclerotic calcifications involving the large vessels of the skull base. No unexpected hyperdense vessel. Skull: Normal. Negative for fracture or focal lesion. Sinuses/Orbits: No acute finding. Other: None. IMPRESSION: No acute intracranial abnormality. No interval change from the recently performed study. Electronically Signed   By: Davina Poke D.O.   On: 05/11/2022 10:01   CT ANGIO HEAD NECK W WO CM (CODE STROKE)  Result Date: 05/11/2022 CLINICAL DATA:  Code stroke. 72 year old male right side deficit. Status post TNK. EXAM: CT ANGIOGRAPHY HEAD AND NECK TECHNIQUE: Multidetector CT imaging of the head and neck was performed using the standard protocol during bolus administration of intravenous contrast. Multiplanar CT image reconstructions and MIPs were obtained to evaluate the vascular anatomy. Carotid stenosis measurements (when applicable) are obtained utilizing NASCET criteria, using the distal internal carotid diameter as the denominator. RADIATION DOSE REDUCTION: This exam was performed according to the departmental dose-optimization program which includes automated exposure control, adjustment of the mA and/or kV according to patient size and/or use of iterative reconstruction technique. CONTRAST:  2m  OMNIPAQUE IOHEXOL 350 MG/ML SOLN COMPARISON:  Plain head CT this morning. FINDINGS: CTA NECK Skeleton: Widespread cervical spine degeneration, bulky disc and endplate disease. No acute osseous abnormality identified. Upper chest: Negative lung apices. Visible superior mediastinal lymph nodes are within normal limits. Other neck: Retropharyngeal course of both carotid arteries in the neck. Aortic arch: Calcified aortic atherosclerosis. Bovine arch configuration. Right carotid system: No brachiocephalic artery plaque or stenosis. Tortuous right CCA origin without stenosis. Retropharyngeal right CCA and right carotid bifurcation with only mild right ICA plaque and no stenosis. Left carotid system: Bovine left CCA origin with no plaque or stenosis. Similar tortuosity and retropharyngeal course with only mild calcified plaque at the bifurcation more affecting the ECA, no stenosis. Vertebral arteries: Calcified plaque and tortuosity of the right subclavian artery origin without significant stenosis. Right vertebral artery origin is patent without significant stenosis. Right vertebral artery is patent to the skull base with no significant plaque or stenosis. Proximal left subclavian artery and left vertebral artery origin are patent with no significant plaque or stenosis. Tortuous left V1 segment. Codominant left vertebral arteries patent to the skull base with no significant stenosis. CTA HEAD Posterior circulation: Codominant V4 segments and vertebrobasilar junction are patent with no significant plaque or stenosis. Bilateral AICA appear dominant, patent. Basilar artery is patent although with long segment mild proximal and mid basilar stenosis, series 10, image 32. Mild fusiform enlargement of the basilar tip. Basilar tip, SCA and left PCA origins are patent. Fetal type right PCA origin with atherosclerotic and stenotic right posterior communicating artery. Furthermore, the right PCA P2 segment is irregular and  stenotic. See series 8, image 19. Distal right PCA branches remain patent. Contralateral up to moderate left PCA origin irregularity and stenosis. Superimposed moderate distal left P2 segment stenosis, and mild-to-moderate P3 superior division stenosis. Anterior circulation: Both ICA siphons are patent. Up to moderate calcified left siphon plaque but no significant left siphon stenosis. Similar moderate right siphon plaque, but only mild supraclinoid right siphon stenosis. Patent carotid termini, MCA and ACA origins. Mild to moderate bilateral A1 segment irregularity and stenosis slightly greater on the right (series 10, image 30). Diminutive or absent anterior communicating artery. There is moderate to severe left ACA A2 segment stenosis on series 12, image 23 but the distal ACA branches appear to remain patent. Up to moderate distal right ACA branch irregularity and stenosis on series 12, image 23 also. Left MCA M1 segment is mildly irregular but patent to the bifurcation without significant stenosis. No left MCA branch occlusion is identified. Mild left MCA branch irregularity. Contralateral right MCA M1 is mildly tortuous and irregular without stenosis. Patent right MCA bifurcation. Mild M2 but moderate posterior right M3 or M4 branch irregularity and stenosis on series 12, image 15. Venous sinuses: Patent. Anatomic variants: Bovine arch configuration. Fetal type right PCA origin. Review of the MIP images confirms the above findings IMPRESSION: 1. Negative for large vessel occlusion. But Positive for advanced and widespread intracranial atherosclerosis: - highly stenotic Right fetal PCA. - moderate tandem stenoses of the contralateral Left PCA. - mild bilateral ACA A1 stenosis, and moderate to severe stenosis Left ACA A2 and right A3. - up to moderate Right MCA M3/4 branch stenoses. - mild stenoses of the Basilar artery, ICA siphons. 2.  Aortic Atherosclerosis (ICD10-I70.0). Salient findings Study discussed by  telephone with Dr. Rory Percy on 05/11/2022 at 09:35. At 0924 hours Electronically Signed   By: Genevie Ann M.D.   On: 05/11/2022 09:37  CT HEAD CODE STROKE WO CONTRAST  Result Date: 05/11/2022 CLINICAL DATA:  Code stroke.  72 year old male right side deficit. EXAM: CT HEAD WITHOUT CONTRAST TECHNIQUE: Contiguous axial images were obtained from the base of the skull through the vertex without intravenous contrast. RADIATION DOSE REDUCTION: This exam was performed according to the departmental dose-optimization program which includes automated exposure control, adjustment of the mA and/or kV according to patient size and/or use of iterative reconstruction technique. COMPARISON:  Head CT 12/30/2011. FINDINGS: Brain: Cerebral volume loss since 2013 along with substantially progressed but chronic appearing small vessel ischemia in the right basal ganglia and internal capsule. Chronic appearing cystic encephalomalacia there. Asymmetric additional patchy cerebral white matter hypodensity has increased but is age indeterminate. No superimposed acute cortically based infarct identified. Vascular: Calcified atherosclerosis at the skull base. Skull: Negative. Sinuses/Orbits: Visualized paranasal sinuses and mastoids are stable and well aerated. Other: Orbits and scalp appears stable and essentially negative, there is a tiny midline forehead benign scalp lipoma. ASPECTS [facility] Stroke Program Early CT Score) Total score (0-10 with 10 being normal): 10 IMPRESSION: 1. No acute cortically based infarct or acute intracranial hemorrhage identified. ASPECTS 10. 2. But substantially new/progressed small vessel disease since 2013, especially in the right basal ganglia and internal capsule. 3. These results were communicated to Dr. Rory Percy at 9:12 am on 05/11/2022 by text page via the [facility] messaging system. Electronically Signed   By: Genevie Ann M.D.   On: 05/11/2022 09:13    DISCHARGE EXAMINATION: Vitals:   05/19/22 2003 05/19/22 2329 05/20/22  0318 05/20/22 0759  BP: 128/64 (!) 146/70 (!) 148/79 (!) 143/65  Pulse: (!) 59 67 65 (!) 58  Resp:    17  Temp: 97.9 F (36.6 C) 98.4 F (36.9 C) 97.7 F (36.5 C) 97.7 F (36.5 C)  TempSrc: Oral Oral Oral Oral  SpO2: 92% 98% 99% 93%  Weight:      Height:       General appearance: Awake alert.  In no distress Resp: Clear to auscultation bilaterally.  Normal effort Cardio: S1-S2 is normal regular.  No S3-S4.  No rubs murmurs or bruit GI: Abdomen is soft.  Nontender nondistended.  Bowel sounds are present normal.  No masses organomegaly    DISPOSITION: SNF  Discharge Instructions     Ambulatory referral to Neurology   Complete by: As directed    An appointment is requested in approximately: 8 weeks   Call MD for:  difficulty breathing, headache or visual disturbances   Complete by: As directed    Call MD for:  extreme fatigue   Complete by: As directed    Call MD for:  persistant dizziness or light-headedness   Complete by: As directed    Call MD for:  persistant nausea and vomiting   Complete by: As directed    Call MD for:  severe uncontrolled pain   Complete by: As directed    Call MD for:  temperature >100.4   Complete by: As directed    Diet - low sodium heart healthy   Complete by: As directed    Diet Carb Modified   Complete by: As directed    Discharge instructions   Complete by: As directed    Please review instructions on the discharge summary.  You were cared for by a hospitalist during your hospital stay. If you have any questions about your discharge medications or the care you received while you were in the hospital after you are discharged, you  can call the unit and asked to speak with the hospitalist on call if the hospitalist that took care of you is not available. Once you are discharged, your primary care physician will handle any further medical issues. Please note that NO REFILLS for any discharge medications will be authorized once you are  discharged, as it is imperative that you return to your primary care physician (or establish a relationship with a primary care physician if you do not have one) for your aftercare needs so that they can reassess your need for medications and monitor your lab values. If you do not have a primary care physician, you can call [phone] for a physician referral.   Increase activity slowly   Complete by: As directed          Allergies as of 05/20/2022       Reactions   Codeine Itching   Latex Itching        Medication List     STOP taking these medications    amLODipine 10 MG tablet Commonly known as: NORVASC   tadalafil (PAH) 20 MG tablet Commonly known as: ADCIRCA       TAKE these medications    Accu-Chek Softclix Lancets lancets Use as directed to test blood sugar once daily.   aspirin EC 81 MG tablet Take 1 tablet (81 mg total) by mouth daily. Swallow whole. What changed: additional instructions   atorvastatin 20 MG tablet Commonly known as: LIPITOR Take 1 tablet (20 mg total) by mouth daily.   carvedilol 25 MG tablet Commonly known as: COREG TAKE 1 TABLET (25 MG TOTAL) BY MOUTH 2 (TWO) TIMES DAILY WITH A MEAL. What changed: how much to take   clopidogrel 75 MG tablet Commonly known as: PLAVIX Take 1 tablet (75 mg total) by mouth daily.   famotidine 20 MG tablet Commonly known as: PEPCID Take 1 tablet (20 mg total) by mouth daily.   Farxiga 10 MG Tabs tablet Generic drug: dapagliflozin propanediol Take 1 tablet (10 mg total) by mouth once daily before breakfast.   furosemide 40 MG tablet Commonly known as: LASIX TAKE 1 TABLET (40 MG TOTAL) BY MOUTH ONCE DAILY. What changed: how much to take   losartan 100 MG tablet Commonly known as: COZAAR Take 1 tablet (100 mg total) by mouth once daily.   metFORMIN 500 MG tablet Commonly known as: GLUCOPHAGE Take 1 tablet (500 mg total) by mouth 2 (two) times daily with a meal.   potassium chloride 8 MEQ  tablet Commonly known as: KLOR-CON Take 2 tablets (16 mEq total) by mouth 2 (two) times daily.   spironolactone 25 MG tablet Commonly known as: Aldactone Take 1 tablet (25 mg total) by mouth once daily.   thiamine 100 MG tablet Commonly known as: Vitamin B-1 Take 1 tablet (100 mg total) by mouth daily.   traMADol 50 MG tablet Commonly known as: ULTRAM Take 1 tablet (50 mg total) by mouth every 12 (twelve) hours as needed for severe pain.          Follow-up Information     [name], MD. Schedule an appointment as soon as possible for a visit in 1 week(s).   Specialty: Pulmonary Disease Why: post hospitalization follow up Contact information: [address] [place] [address] [phone]                 TOTAL DISCHARGE TIME: 27 minutes  [place]  [facility] Pager on [email or web address]  05/20/2022, 9:40 AM

## 2022-05-20 NOTE — Progress Notes (Signed)
Mobility Specialist: Progress Note   05/20/22 1106  Mobility  Activity Ambulated with assistance in hallway  Level of Assistance Contact guard assist, steadying assist  Assistive Device Front wheel walker  Distance Ambulated (ft) 250 ft  Activity Response Tolerated well  Mobility Referral Yes  $Mobility charge 1 Mobility   Pt received in the chair and agreeable to mobility. Pt stated his Rt knee feels much better today. No c/o throughout ambulation. Pt back to the chair after session with call bell and phone at his side.   [place] Tomasa Dobransky Mobility Specialist Please contact via SecureChat or Rehab office at [phone]

## 2022-05-21 LAB — GLUCOSE, CAPILLARY
Glucose-Capillary: 133 mg/dL — ABNORMAL HIGH (ref 70–99)
Glucose-Capillary: 114 mg/dL — ABNORMAL HIGH (ref 70–99)
Glucose-Capillary: 151 mg/dL — ABNORMAL HIGH (ref 70–99)
Glucose-Capillary: 125 mg/dL — ABNORMAL HIGH (ref 70–99)

## 2022-05-21 MED ORDER — SENNOSIDES-DOCUSATE SODIUM 8.6-50 MG PO TABS
2.0000 | ORAL_TABLET | Freq: Two times a day (BID) | ORAL | Status: DC
  Administered 2022-05-21 – 2022-05-22 (×3): 2 via ORAL
  Filled 2022-05-21 (×3): qty 2

## 2022-05-21 MED ORDER — POLYETHYLENE GLYCOL 3350 17 G PO PACK: 17.0000 g | PACK | Freq: Every day | ORAL | 0 refills | Status: DC

## 2022-05-21 MED ORDER — SENNOSIDES-DOCUSATE SODIUM 8.6-50 MG PO TABS: 2.0000 | ORAL_TABLET | Freq: Two times a day (BID) | ORAL | Status: DC

## 2022-05-21 MED ORDER — POLYETHYLENE GLYCOL 3350 17 G PO PACK
17.0000 g | PACK | Freq: Every day | ORAL | Status: DC
  Administered 2022-05-21 – 2022-05-22 (×2): 17 g via ORAL
  Filled 2022-05-21 (×2): qty 1

## 2022-05-21 NOTE — Discharge Summary (Signed)
[facility]  Physician Discharge Summary   Patient ID: Austin Mercer. MRN: [id number] DOB/AGE: 1951-03-05 72 y.o.  Admit date: 05/16/2022 Discharge date:   05/21/2022   PCP: [name], MD  DISCHARGE DIAGNOSES:    Acute ischemic stroke [name])   Controlled type 2 diabetes mellitus without complication, without long-term current use of insulin ([name])   Essential hypertension   Class 3 severe obesity due to excess calories with serious comorbidity and body mass index (BMI) of 45.0 to 49.9 in adult [name])   Hepatitis C virus infection cured after antiviral drug therapy   History of alcohol use disorder   Liver cirrhosis (HCC)   CVA (cerebral vascular accident) ([place])   RECOMMENDATIONS FOR OUTPATIENT FOLLOW UP: Please check CBC and basic metabolic panel in 3 to 4 days Ambulatory referral sent to neurology for outpatient follow-up   Home Health: SNF Equipment/Devices: None  CODE STATUS: Full code  DISCHARGE CONDITION: fair  Diet recommendation: Modified carbohydrate/heart healthy  INITIAL HISTORY:  72 y.o. male with history h/o obesity, diabetes mellitus type 2, hypertension who was recently admitted to this facility for CVA-bilateral embolic stroke s/p tnkase and discharged home with DAPT, presented with complaints of worsening right leg weakness and inability to walk.  MRI revealed new strokes.  He was hospitalized for further management.     Consultants: Neurology   Procedures: None    HOSPITAL COURSE:   Acute stroke Recently hospitalized for stroke and was discharged on dual antiplatelet treatment with aspirin and Plavix.  Presented again with new weakness especially in the right lower extremity.  MRI shows new stroke Neurology is following.  They are considering loop recorder placement.  May need to be done in the outpatient setting and will defer to neurology. Patient had extensive neurological workup during his admission recently for his previous  stroke. LDL is 59.  HbA1c 6.2. Patient is on aspirin and Plavix.  Also on statin. PT and OT evaluation.  SNF is recommended for short-term rehab.   Chronic systolic CHF/essential hypertension Patient does have bilateral lower extremity edema and is on diuretics at home.  Recent echocardiogram showed normal left ventricular ejection fraction. Continue with home medications.  He is noted to be on carvedilol, furosemide and spironolactone.  Also on ARB which can be resumed at discharge.  Renal function will need to be monitored closely.   Hyperlipidemia Continue with statin   Diabetes mellitus type 2 Continue with home occasions.   History of hepatitis C/liver cirrhosis He has been treated with hepatitis C with antivirals.  Seems to be stable. No concerns identified on CT scan.   Chronic alcohol use previously Last drink was 2 weeks prior to admission.  No withdrawal symptoms noted.  Continue with thiamine.   Elevated creatinine Creatinine noted to be elevated for the last few days including previous hospitalization.  No known history of chronic kidney disease but he could have Chronic kidney disease stage II-IIIa.  Will need further monitoring in the outpatient setting.  Constipation Patient has not had a bowel movement in several days.  Will start bowel regimen.   Morbid obesity Estimated body mass index is 45.61 kg/m as calculated from the following:   Height as of this encounter: 6' 1"$  (1.854 m).   Weight as of this encounter: 156.8 kg.    Patient reports constipation.  Denies any abdominal pain nausea or vomiting.  Will start bowel regimen.  Patient is otherwise stable and remains stable for discharge.  PERTINENT LABS:  The results of significant diagnostics from this hospitalization (including imaging, microbiology, ancillary and laboratory) are listed below for reference.    Microbiology: Recent Results (from the past 240 hour(s))  MRSA Next Gen by PCR, Nasal      Status: None   Collection Time: 05/11/22 10:54 AM   Specimen: Nasal Mucosa; Nasal Swab  Result Value Ref Range Status   MRSA by PCR Next Gen NOT DETECTED NOT DETECTED Final    Comment: (NOTE) The GeneXpert MRSA Assay (FDA approved for NASAL specimens only), is one component of a comprehensive MRSA colonization surveillance program. It is not intended to diagnose MRSA infection nor to guide or monitor treatment for MRSA infections. Test performance is not FDA approved in patients less than 59 years old. Performed at [facility], [place] [address] [place], [place] [address]      Labs:   Basic Metabolic Panel: Recent Labs  Lab 05/16/22 1547 05/18/22 0626 05/19/22 0648  NA 133* 132* 133*  K 4.7 4.4 4.5  CL 104 103 102  CO2 21* 23 22  GLUCOSE 144* 141* 125*  BUN 19 21 24*  CREATININE 1.64* 1.55* 1.54*  CALCIUM 10.6* 10.7* 10.8*  MG 1.9  --   --    Liver Function Tests: Recent Labs  Lab 05/16/22 1547  AST 25  ALT 22  ALKPHOS 50  BILITOT 0.3  PROT 7.6  ALBUMIN 3.9    CBC: Recent Labs  Lab 05/16/22 1547 05/18/22 0626  WBC 7.3 6.0  NEUTROABS 3.2  --   HGB 13.6 13.3  HCT 41.7 40.3  MCV 82.7 80.4  PLT 141* 135*     CBG: Recent Labs  Lab 05/20/22 0616 05/20/22 1142 05/20/22 1628 05/20/22 2207 05/21/22 0614  GLUCAP 146* 115* 162* 140* 133*     IMAGING STUDIES CT CHEST ABDOMEN PELVIS W CONTRAST  Result Date: 05/17/2022 CLINICAL DATA:  Assessment for occult malignancy. History of diabetes and hypertension. EXAM: CT CHEST, ABDOMEN, AND PELVIS WITH CONTRAST TECHNIQUE: Multidetector CT imaging of the chest, abdomen and pelvis was performed following the standard protocol during bolus administration of intravenous contrast. RADIATION DOSE REDUCTION: This exam was performed according to the departmental dose-optimization program which includes automated exposure control, adjustment of the mA and/or kV according to patient size and/or use of iterative  reconstruction technique. CONTRAST:  121m OMNIPAQUE IOHEXOL 350 MG/ML SOLN COMPARISON:  Abdominopelvic CT 01/12/2022. Chest radiographs 05/16/2022. FINDINGS: CT CHEST FINDINGS Cardiovascular: No acute vascular findings. Mild atherosclerosis of the aorta, great vessels and coronary arteries. There is a small anterior pericardial effusion versus pericardial thickening. The heart size is normal. Mediastinum/Nodes: There are no enlarged mediastinal, hilar or axillary lymph nodes. Small mediastinal and axillary lymph nodes are noted bilaterally. The thyroid gland, trachea and esophagus demonstrate no significant findings. Lungs/Pleura: No pleural effusion or pneumothorax. No suspicious pulmonary nodules. Musculoskeletal/Chest wall: No chest wall mass or suspicious osseous findings. Mild spondylosis. CT ABDOMEN AND PELVIS FINDINGS Hepatobiliary: The liver is normal in density without suspicious focal abnormality. No evidence of gallstones, gallbladder wall thickening or biliary dilatation. Pancreas: Unremarkable. No pancreatic ductal dilatation or surrounding inflammatory changes. Spleen: Normal in size without focal abnormality. Adrenals/Urinary Tract: Both adrenal glands appear normal. No evidence of urinary tract calculus, suspicious renal lesion or hydronephrosis. The bladder appears normal for its degree of distention. Stomach/Bowel: No enteric contrast administered. The stomach appears unremarkable for its degree of distension. No evidence of bowel wall thickening, distention or surrounding inflammatory change.  The appendix appears normal. Vascular/Lymphatic: There are no enlarged abdominal or pelvic lymph nodes. Aortic and branch vessel atherosclerosis without evidence of aneurysm or large vessel occlusion. Reproductive: The prostate gland and seminal vesicles appear unremarkable. Other: No evidence of abdominal wall mass or hernia. No ascites. Musculoskeletal: No acute osseous findings. Multilevel lumbar  spondylosis with chronic severe foraminal narrowing bilaterally at L5-S1. Probable central canal stenosis in the lower lumbar spine. IMPRESSION: 1. No acute findings or evidence of malignancy within the chest, abdomen or pelvis. 2. Small anterior pericardial effusion versus pericardial thickening. 3. Multilevel lumbar spondylosis with chronic severe foraminal narrowing bilaterally at L5-S1. 4.  Aortic Atherosclerosis (ICD10-I70.0). Electronically Signed   By: Richardean Sale M.D.   On: 05/17/2022 10:38   MR BRAIN WO CONTRAST  Result Date: 05/17/2022 CLINICAL DATA:  Stroke suspected, increased bilateral leg weakness EXAM: MRI HEAD WITHOUT CONTRAST TECHNIQUE: Multiplanar, multiecho pulse sequences of the brain and surrounding structures were obtained without intravenous contrast. COMPARISON:  05/13/2021 FINDINGS: Brain: New area of restricted diffusion with ADC correlate in the left pons (series 2, images 15-17 and series 3, image 17), consistent with acute infarct. Other foci of mildly increased signal on diffusion-weighted imaging bilateral occipital lobes and left parietal lobe were present on the prior exam, likely subacute infarcts. No acute hemorrhage, mass, mass effect, or midline shift. No hydrocephalus or extra-axial collection. T2 hyperintense signal in the periventricular white matter, likely the sequela of moderate chronic small vessel ischemic disease. Remote lacunar infarcts in the right greater than left basal ganglia. Vascular: Normal arterial flow voids. Skull and upper cervical spine: Normal marrow signal. Sinuses/Orbits: No acute finding. Other: Trace fluid in right mastoid air cells. IMPRESSION: 1. 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 2. Redemonstrated subacute infarcts in the bilateral occipital lobes and left parietal lobe. These results were called by telephone at the time of interpretation on 05/16/2022 at 11:59 pm to provider POLLINA, who verbally acknowledged these results. Electronically  Signed   By: Merilyn Baba M.D.   On: 05/17/2022 00:02   CT Head Wo Contrast  Result Date: 05/16/2022 CLINICAL DATA:  Recent stroke with increased bilateral leg weakness unable to stand EXAM: CT HEAD WITHOUT CONTRAST TECHNIQUE: Contiguous axial images were obtained from the base of the skull through the vertex without intravenous contrast. RADIATION DOSE REDUCTION: This exam was performed according to the departmental dose-optimization program which includes automated exposure control, adjustment of the mA and/or kV according to patient size and/or use of iterative reconstruction technique. COMPARISON:  CT 05/12/2022, MRI 05/13/2022 FINDINGS: Brain: No acute territorial infarction, hemorrhage, or intracranial mass. Atrophy and chronic small vessel ischemic changes of the white matter. 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 Recently demonstrated small parietooccipital cortical infarcts on MRI are not well seen by CT. The ventricles are stable in size. Vascular: No hyperdense vessels.  Carotid vascular calcification Skull: Normal. Negative for fracture or focal lesion. Sinuses/Orbits: No acute finding. Other: None IMPRESSION: 1. No CT evidence for acute intracranial abnormality. 2. Atrophy and chronic small vessel ischemic changes of the white matter. 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 Recently demonstrated small cortical infarcts on MRI are not well seen by CT Electronically Signed   By: Donavan Foil M.D.   On: 05/16/2022 17:30   DG Chest 1 View  Result Date: 05/16/2022 CLINICAL DATA:  Pt having leg and arm weakness, feeling overall weak since yesterday - hx of stroke, htn, diabetes  EXAM: CHEST  1 VIEW COMPARISON:  03/06/2020 FINDINGS: Lungs are clear. Heart size and mediastinal contours are within normal limits. Aortic Atherosclerosis (ICD10-170.0). No effusion. Visualized bones unremarkable. IMPRESSION: No acute cardiopulmonary  disease. Electronically Signed   By: Lucrezia Europe M.D.   On: 05/16/2022 16:12   VAS [place] LOWER EXTREMITY VENOUS (DVT)  Result Date: 05/13/2022  Lower Venous DVT Study Patient Name:  Austin Mercer.  Date of Exam:   05/13/2022 Medical Rec #: [id number]         Accession #:    [id number] Date of Birth: 03/26/1951         Patient Gender: M Patient Age:   49 years Exam Location:  [facility] Procedure:      VAS [place] LOWER EXTREMITY VENOUS (DVT) Referring Phys: Janine Ores --------------------------------------------------------------------------------  Indications: Stroke.  Limitations: Body habitus and poor ultrasound/tissue interface. Comparison Study: No previous exams Performing Technologist: Jody Hill RVT, RDMS  Examination Guidelines: A complete evaluation includes B-mode imaging, spectral Doppler, color Doppler, and power Doppler as needed of all accessible portions of each vessel. Bilateral testing is considered an integral part of a complete examination. Limited examinations for reoccurring indications may be performed as noted. The reflux portion of the exam is performed with the patient in reverse Trendelenburg.  +---------+---------------+---------+-----------+----------+-------------------+ RIGHT    CompressibilityPhasicitySpontaneityPropertiesThrombus Aging      +---------+---------------+---------+-----------+----------+-------------------+ CFV      Full           Yes      Yes                                      +---------+---------------+---------+-----------+----------+-------------------+ SFJ      Full                                                             +---------+---------------+---------+-----------+----------+-------------------+ FV Prox  Full           Yes      Yes                                      +---------+---------------+---------+-----------+----------+-------------------+ FV Mid   Full           Yes      Yes                                       +---------+---------------+---------+-----------+----------+-------------------+ FV DistalFull           Yes      Yes                                      +---------+---------------+---------+-----------+----------+-------------------+ PFV      Full                                                             +---------+---------------+---------+-----------+----------+-------------------+  POP      Full           Yes      Yes                                      +---------+---------------+---------+-----------+----------+-------------------+ PTV      Full                                                             +---------+---------------+---------+-----------+----------+-------------------+ PERO     Full                                         Not well visualized +---------+---------------+---------+-----------+----------+-------------------+   +---------+---------------+---------+-----------+----------+--------------+ LEFT     CompressibilityPhasicitySpontaneityPropertiesThrombus Aging +---------+---------------+---------+-----------+----------+--------------+ CFV      Full           Yes      Yes                                 +---------+---------------+---------+-----------+----------+--------------+ SFJ      Full                                                        +---------+---------------+---------+-----------+----------+--------------+ FV Prox  Full           Yes      Yes                                 +---------+---------------+---------+-----------+----------+--------------+ FV Mid   Full           Yes      Yes                                 +---------+---------------+---------+-----------+----------+--------------+ FV DistalFull           Yes      Yes                                 +---------+---------------+---------+-----------+----------+--------------+ PFV      Full                                                         +---------+---------------+---------+-----------+----------+--------------+ POP      Full           Yes      Yes                                 +---------+---------------+---------+-----------+----------+--------------+ PTV      Full                                                        +---------+---------------+---------+-----------+----------+--------------+  PERO                                                  Not visualized +---------+---------------+---------+-----------+----------+--------------+   Left Technical Findings: Not visualized segments include Peroneal veins.   Summary: BILATERAL: - No evidence of deep vein thrombosis seen in the lower extremities, bilaterally. -No evidence of popliteal cyst, bilaterally.   *See table(s) above for measurements and observations. Electronically signed by Harold Barban MD on 05/13/2022 at 4:51:38 PM.    Final    MR BRAIN WO CONTRAST  Result Date: 05/13/2022 CLINICAL DATA:  Stroke, follow up. EXAM: MRI HEAD WITHOUT CONTRAST TECHNIQUE: Multiplanar, multiecho pulse sequences of the brain and surrounding structures were obtained without intravenous contrast. COMPARISON:  Head CT 05/12/2022 and MRI 05/11/2022 FINDINGS: At the request of the inpatient stroke team, only axial and coronal diffusion weighted imaging was performed. There are a few subcentimeter acute bilateral parieto-occipital cortical infarcts which were not present on the recent prior MRI. Chronic small vessel ischemia is again noted including chronic lacunar infarcts in the bilateral basal ganglia and deep white matter, more fully evaluated on the recent complete MRI. No intracranial mass effect or extra-axial fluid collection is evident on this abbreviated examination. IMPRESSION: Small acute bilateral parieto-occipital cortical infarcts. Electronically Signed   By: Logan Bores M.D.   On: 05/13/2022 11:43   CT HEAD WO CONTRAST (5MM)  Result Date: 05/12/2022 CLINICAL  DATA:  Stroke, follow-up. EXAM: CT HEAD WITHOUT CONTRAST TECHNIQUE: Contiguous axial images were obtained from the base of the skull through the vertex without intravenous contrast. RADIATION DOSE REDUCTION: This exam was performed according to the departmental dose-optimization program which includes automated exposure control, adjustment of the mA and/or kV according to patient size and/or use of iterative reconstruction technique. COMPARISON:  Head CT 05/11/2022.  MRI brain 05/11/2022. FINDINGS: Brain: No acute intracranial hemorrhage. Unchanged findings of chronic small-vessel disease with old lacunar infarcts in the bilateral basal ganglia, right external capsule/corona radiata and right internal capsule. Gray-white differentiation is otherwise preserved. No hydrocephalus or extra-axial collection. Vascular: No hyperdense vessel or unexpected calcification. Skull: No calvarial fracture or suspicious bone lesion. Skull base is unremarkable. Sinuses/Orbits: Unremarkable. Other: None. IMPRESSION: 1. No acute intracranial abnormality. 2. Unchanged findings of chronic small-vessel disease. Electronically Signed   By: Emmit Alexanders M.D.   On: 05/12/2022 08:53   ECHOCARDIOGRAM COMPLETE  Result Date: 05/11/2022    ECHOCARDIOGRAM REPORT   Patient Name:   Austin Mercer. Date of Exam: 05/11/2022 Medical Rec #:  [id number]        Height:       73.0 in Accession #:    [id number]       Weight:       360.0 lb Date of Birth:  10-28-1950        BSA:          2.762 m Patient Age:    18 years         BP:           195/104 mmHg Patient Gender: M                HR:           60 bpm. Exam Location:  Inpatient Procedure: 2D Echo, Cardiac Doppler and Color Doppler Indications:    Stroke  History:  Patient has no prior history of Echocardiogram examinations.                 Abnormal ECG, Stroke, Arrythmias:AV block,                 Signs/Symptoms:Dizziness/Lightheadedness; Risk                 Factors:Hypertension and  Diabetes. ETOH.  Sonographer:    Roseanna Rainbow RDCS Referring Phys: Amie Portland  Sonographer Comments: Technically difficult study due to poor echo windows and patient is obese. Image acquisition challenging due to patient body habitus. IMPRESSIONS  1. Left ventricular ejection fraction, by estimation, is 55 to 60%. The left ventricle has normal function. The left ventricle has no regional wall motion abnormalities. There is moderate left ventricular hypertrophy. Left ventricular diastolic parameters are consistent with Grade I diastolic dysfunction (impaired relaxation).  2. Right ventricular systolic function is normal. The right ventricular size is normal.  3. The mitral valve is normal in structure. Mild mitral valve regurgitation. No evidence of mitral stenosis.  4. The aortic valve has an indeterminant number of cusps. Aortic valve regurgitation is moderate. Mild aortic valve stenosis.  5. Aortic dilatation noted. There is borderline dilatation of the aortic root, measuring 39 mm.  6. The inferior vena cava is normal in size with greater than 50% respiratory variability, suggesting right atrial pressure of 3 mmHg. Comparison(s): No prior Echocardiogram. FINDINGS  Left Ventricle: Left ventricular ejection fraction, by estimation, is 55 to 60%. The left ventricle has normal function. The left ventricle has no regional wall motion abnormalities. The left ventricular internal cavity size was normal in size. There is  moderate left ventricular hypertrophy. Left ventricular diastolic parameters are consistent with Grade I diastolic dysfunction (impaired relaxation). Right Ventricle: The right ventricular size is normal. Right ventricular systolic function is normal. Left Atrium: Left atrial size was normal in size. Right Atrium: Right atrial size was normal in size. Pericardium: Trivial pericardial effusion is present. Mitral Valve: The mitral valve is normal in structure. Mild mitral valve regurgitation. No evidence  of mitral valve stenosis. Tricuspid Valve: The tricuspid valve is normal in structure. Tricuspid valve regurgitation is trivial. No evidence of tricuspid stenosis. Aortic Valve: The aortic valve has an indeterminant number of cusps. Aortic valve regurgitation is moderate. Aortic regurgitation PHT measures 709 msec. Mild aortic stenosis is present. Aortic valve mean gradient measures 16.0 mmHg. Aortic valve peak gradient measures 30.6 mmHg. Aortic valve area, by VTI measures 2.89 cm. Pulmonic Valve: The pulmonic valve was not well visualized. Pulmonic valve regurgitation is not visualized. No evidence of pulmonic stenosis. Aorta: Aortic dilatation noted. There is borderline dilatation of the aortic root, measuring 39 mm. Venous: The inferior vena cava is normal in size with greater than 50% respiratory variability, suggesting right atrial pressure of 3 mmHg. IAS/Shunts: No atrial level shunt detected by color flow Doppler.  LEFT VENTRICLE PLAX 2D LVIDd:         5.10 cm      Diastology LVIDs:         3.30 cm      LV e' medial:    5.98 cm/s LV PW:         1.40 cm      LV E/e' medial:  12.5 LV IVS:        1.80 cm      LV e' lateral:   6.96 cm/s LVOT diam:     2.80 cm  LV E/e' lateral: 10.7 LV SV:         166 LV SV Index:   60 LVOT Area:     6.16 cm  LV Volumes (MOD) LV vol d, MOD A2C: 229.0 ml LV vol d, MOD A4C: 176.0 ml LV vol s, MOD A2C: 71.5 ml LV vol s, MOD A4C: 72.7 ml LV SV MOD A2C:     157.5 ml LV SV MOD A4C:     176.0 ml LV SV MOD BP:      140.8 ml RIGHT VENTRICLE             IVC RV S prime:     16.90 cm/s  IVC diam: 2.10 cm TAPSE (M-mode): 1.9 cm LEFT ATRIUM             Index        RIGHT ATRIUM           Index LA diam:        3.60 cm 1.30 cm/m   RA Area:     12.50 cm LA Vol (A2C):   78.0 ml 28.24 ml/m  RA Volume:   25.50 ml  9.23 ml/m LA Vol (A4C):   60.7 ml 21.98 ml/m LA Biplane Vol: 69.9 ml 25.31 ml/m  AORTIC VALVE AV Area (Vmax):    2.83 cm AV Area (Vmean):   2.74 cm AV Area (VTI):     2.89  cm AV Vmax:           276.67 cm/s AV Vmean:          182.667 cm/s AV VTI:            0.573 m AV Peak Grad:      30.6 mmHg AV Mean Grad:      16.0 mmHg LVOT Vmax:         127.00 cm/s LVOT Vmean:        81.400 cm/s LVOT VTI:          0.269 m LVOT/AV VTI ratio: 0.47 AI PHT:            709 msec  AORTA Ao Root diam: 3.90 cm Ao Asc diam:  3.80 cm MITRAL VALVE MV Area (PHT): 2.80 cm     SHUNTS MV Decel Time: 271 msec     Systemic VTI:  0.27 m MV E velocity: 74.80 cm/s   Systemic Diam: 2.80 cm MV A velocity: 101.15 cm/s MV E/A ratio:  0.74 Kirk Ruths MD Electronically signed by Kirk Ruths MD Signature Date/Time: 05/11/2022/1:48:03 PM    Final    EEG adult  Result Date: 05/11/2022 Lora Havens, MD     05/11/2022 12:16 PM Patient Name: Austin Mercer. MRN: [id number] Epilepsy Attending: Lora Havens Referring Physician/Provider: August Albino, NP Date: 05/11/2022 Duration: 23.38 mins Patient history: 72 year old male who initially presented with right-sided numbness, tingling and dizziness. He suddenly became globally aphasic, had worsening right-sided weakness, right-sided neglect and left gaze preference.  EEG to evaluate for seizure. Level of alertness: Awake,asleep AEDs during EEG study: None Technical aspects: This EEG study was done with scalp electrodes positioned according to the 10-20 International system of electrode placement. Electrical activity was reviewed with band pass filter of 1-70Hz$ , sensitivity of 7 uV/mm, display speed of 14m/sec with a 60Hz$  notched filter applied as appropriate. EEG data were recorded continuously and digitally stored.  Video monitoring was available and reviewed as appropriate. Description: The posterior dominant rhythm consists of 8 Hz activity of  moderate voltage (25-35 uV) seen predominantly in posterior head regions, symmetric and reactive to eye opening and eye closing. Sleep was characterized by vertex waves, sleep spindles (12 to 14 Hz), maximal frontocentral  region.  EEG showed continuous generalized 3 to 7 Hz theta-delta slowing. Hyperventilation and photic stimulation were not performed.   ABNORMALITY - Continuous slow, generalized IMPRESSION: This study is suggestive of moderate diffuse encephalopathy, nonspecific etiology. No seizures or epileptiform discharges were seen throughout the recording. Lora Havens   MR BRAIN WO CONTRAST  Result Date: 05/11/2022 CLINICAL DATA:  72 year old male code stroke presentation status post TNK for right side deficits. EXAM: MRI HEAD WITHOUT CONTRAST TECHNIQUE: Multiplanar, multiecho pulse sequences of the brain and surrounding structures were obtained without intravenous contrast. COMPARISON:  CTA head and neck, head CTs today. FINDINGS: Brain: Study is mildly degraded by motion artifact despite repeated imaging attempts. No convincing diffusion restriction. 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 Mild midbrain Wallerian degeneration. Mild contralateral left deep gray nuclei chronic small vessel disease. Scattered mild additional bilateral white matter T2 and FLAIR hyperintensity. No cortical encephalomalacia or chronic cerebral blood products identified. No midline shift, mass effect, evidence of mass lesion, ventriculomegaly, extra-axial collection or acute intracranial hemorrhage. Cervicomedullary junction and pituitary are within normal limits. Vascular: Major intracranial vascular flow voids are preserved. Skull and upper cervical spine: Negative. Sinuses/Orbits: Coronal T2 weighted imaging not provided. Orbit motion artifact. Paranasal sinuses and mastoids are stable and well aerated. Other: Grossly negative visible internal auditory structures. IMPRESSION: 1. Intermittently motion degraded exam. No acute intracranial abnormality identified. 2. 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 Electronically Signed   By: Genevie Ann M.D.    On: 05/11/2022 10:42   CT HEAD WO CONTRAST (5MM)  Result Date: 05/11/2022 CLINICAL DATA:  Hypertension EXAM: CT HEAD WITHOUT CONTRAST TECHNIQUE: Contiguous axial images were obtained from the base of the skull through the vertex without intravenous contrast. RADIATION DOSE REDUCTION: This exam was performed according to the departmental dose-optimization program which includes automated exposure control, adjustment of the mA and/or kV according to patient size and/or use of iterative reconstruction technique. COMPARISON:  Same day CT at 0902 hours FINDINGS: Brain: No evidence of acute infarction, hemorrhage, hydrocephalus, extra-axial collection or mass lesion/mass effect. Moderate low-density changes within the periventricular and subcortical white matter compatible with chronic microvascular ischemic change. Mild diffuse cerebral volume loss. Vascular: Atherosclerotic calcifications involving the large vessels of the skull base. No unexpected hyperdense vessel. Skull: Normal. Negative for fracture or focal lesion. Sinuses/Orbits: No acute finding. Other: None. IMPRESSION: No acute intracranial abnormality. No interval change from the recently performed study. Electronically Signed   By: Davina Poke D.O.   On: 05/11/2022 10:01   CT ANGIO HEAD NECK W WO CM (CODE STROKE)  Result Date: 05/11/2022 CLINICAL DATA:  Code stroke. 72 year old male right side deficit. Status post TNK. EXAM: CT ANGIOGRAPHY HEAD AND NECK TECHNIQUE: Multidetector CT imaging of the head and neck was performed using the standard protocol during bolus administration of intravenous contrast. Multiplanar CT image reconstructions and MIPs were obtained to evaluate the vascular anatomy. Carotid stenosis measurements (when applicable) are obtained utilizing NASCET criteria, using the distal internal carotid diameter as the denominator. RADIATION DOSE REDUCTION: This exam was performed according to the departmental dose-optimization program  which includes automated exposure control, adjustment of the mA and/or kV according to patient size and/or use of iterative reconstruction technique. CONTRAST:  37m OMNIPAQUE IOHEXOL 350 MG/ML SOLN COMPARISON:  Plain head CT this morning. FINDINGS: CTA NECK Skeleton: Widespread cervical spine degeneration, bulky disc and endplate disease. No acute osseous abnormality identified. Upper chest: Negative lung apices. Visible superior mediastinal lymph nodes are within normal limits. Other neck: Retropharyngeal course of both carotid arteries in the neck. Aortic arch: Calcified aortic atherosclerosis. Bovine arch configuration. Right carotid system: No brachiocephalic artery plaque or stenosis. Tortuous right CCA origin without stenosis. Retropharyngeal right CCA and right carotid bifurcation with only mild right ICA plaque and no stenosis. Left carotid system: Bovine left CCA origin with no plaque or stenosis. Similar tortuosity and retropharyngeal course with only mild calcified plaque at the bifurcation more affecting the ECA, no stenosis. Vertebral arteries: Calcified plaque and tortuosity of the right subclavian artery origin without significant stenosis. Right vertebral artery origin is patent without significant stenosis. Right vertebral artery is patent to the skull base with no significant plaque or stenosis. Proximal left subclavian artery and left vertebral artery origin are patent with no significant plaque or stenosis. Tortuous left V1 segment. Codominant left vertebral arteries patent to the skull base with no significant stenosis. CTA HEAD Posterior circulation: Codominant V4 segments and vertebrobasilar junction are patent with no significant plaque or stenosis. Bilateral AICA appear dominant, patent. Basilar artery is patent although with long segment mild proximal and mid basilar stenosis, series 10, image 32. Mild fusiform enlargement of the basilar tip. Basilar tip, SCA and left PCA origins are  patent. Fetal type right PCA origin with atherosclerotic and stenotic right posterior communicating artery. Furthermore, the right PCA P2 segment is irregular and stenotic. See series 8, image 19. Distal right PCA branches remain patent. Contralateral up to moderate left PCA origin irregularity and stenosis. Superimposed moderate distal left P2 segment stenosis, and mild-to-moderate P3 superior division stenosis. Anterior circulation: Both ICA siphons are patent. Up to moderate calcified left siphon plaque but no significant left siphon stenosis. Similar moderate right siphon plaque, but only mild supraclinoid right siphon stenosis. Patent carotid termini, MCA and ACA origins. Mild to moderate bilateral A1 segment irregularity and stenosis slightly greater on the right (series 10, image 30). Diminutive or absent anterior communicating artery. There is moderate to severe left ACA A2 segment stenosis on series 12, image 23 but the distal ACA branches appear to remain patent. Up to moderate distal right ACA branch irregularity and stenosis on series 12, image 23 also. Left MCA M1 segment is mildly irregular but patent to the bifurcation without significant stenosis. No left MCA branch occlusion is identified. Mild left MCA branch irregularity. Contralateral right MCA M1 is mildly tortuous and irregular without stenosis. Patent right MCA bifurcation. Mild M2 but moderate posterior right M3 or M4 branch irregularity and stenosis on series 12, image 15. Venous sinuses: Patent. Anatomic variants: Bovine arch configuration. Fetal type right PCA origin. Review of the MIP images confirms the above findings IMPRESSION: 1. Negative for large vessel occlusion. But Positive for advanced and widespread intracranial atherosclerosis: - highly stenotic Right fetal PCA. - moderate tandem stenoses of the contralateral Left PCA. - mild bilateral ACA A1 stenosis, and moderate to severe stenosis Left ACA A2 and right A3. - up to moderate  Right MCA M3/4 branch stenoses. - mild stenoses of the Basilar artery, ICA siphons. 2.  Aortic Atherosclerosis (ICD10-I70.0). Salient findings Study discussed by telephone with Dr. ARory Percyon 05/11/2022 at 09:35. At 0924 hours Electronically Signed   By: HGenevie AnnM.D.   On: 05/11/2022 09:37  CT HEAD CODE STROKE WO CONTRAST  Result Date: 05/11/2022 CLINICAL DATA:  Code stroke.  72 year old male right side deficit. EXAM: CT HEAD WITHOUT CONTRAST TECHNIQUE: Contiguous axial images were obtained from the base of the skull through the vertex without intravenous contrast. RADIATION DOSE REDUCTION: This exam was performed according to the departmental dose-optimization program which includes automated exposure control, adjustment of the mA and/or kV according to patient size and/or use of iterative reconstruction technique. COMPARISON:  Head CT 12/30/2011. FINDINGS: Brain: Cerebral volume loss since 2013 along with substantially progressed but chronic appearing small vessel ischemia in the right basal ganglia and internal capsule. Chronic appearing cystic encephalomalacia there. Asymmetric additional patchy cerebral white matter hypodensity has increased but is age indeterminate. No superimposed acute cortically based infarct identified. Vascular: Calcified atherosclerosis at the skull base. Skull: Negative. Sinuses/Orbits: Visualized paranasal sinuses and mastoids are stable and well aerated. Other: Orbits and scalp appears stable and essentially negative, there is a tiny midline forehead benign scalp lipoma. ASPECTS [facility] Stroke Program Early CT Score) Total score (0-10 with 10 being normal): 10 IMPRESSION: 1. No acute cortically based infarct or acute intracranial hemorrhage identified. ASPECTS 10. 2. But substantially new/progressed small vessel disease since 2013, especially in the right basal ganglia and internal capsule. 3. These results were communicated to Dr. Rory Percy at 9:12 am on 05/11/2022 by text page via the  [facility] messaging system. Electronically Signed   By: Genevie Ann M.D.   On: 05/11/2022 09:13    DISCHARGE EXAMINATION: Vitals:   05/20/22 1601 05/20/22 2026 05/20/22 2338 05/21/22 0348  BP: 120/68 134/61 137/71 139/73  Pulse: 60 (!) 56 70 (!) 53  Resp: 17 20 18 20  $ Temp: 98.6 F (37 C) (!) 97.4 F (36.3 C) 98.6 F (37 C) 97.7 F (36.5 C)  TempSrc: Oral Oral Oral Oral  SpO2: 96% 99% 97% 99%  Weight:      Height:       General appearance: Awake alert.  In no distress Resp: Clear to auscultation bilaterally.  Normal effort Cardio: S1-S2 is normal regular.  No S3-S4.  No rubs murmurs or bruit GI: Abdomen is soft.  Nontender nondistended.  Bowel sounds are present normal.  No masses organomegaly    DISPOSITION: SNF  Discharge Instructions     Ambulatory referral to Neurology   Complete by: As directed    An appointment is requested in approximately: 8 weeks   Call MD for:  difficulty breathing, headache or visual disturbances   Complete by: As directed    Call MD for:  extreme fatigue   Complete by: As directed    Call MD for:  persistant dizziness or light-headedness   Complete by: As directed    Call MD for:  persistant nausea and vomiting   Complete by: As directed    Call MD for:  severe uncontrolled pain   Complete by: As directed    Call MD for:  temperature >100.4   Complete by: As directed    Diet - low sodium heart healthy   Complete by: As directed    Diet Carb Modified   Complete by: As directed    Discharge instructions   Complete by: As directed    Please review instructions on the discharge summary.  You were cared for by a hospitalist during your hospital stay. If you have any questions about your discharge medications or the care you received while you were in the hospital after you are discharged, you can call  the unit and asked to speak with the hospitalist on call if the hospitalist that took care of you is not available. Once you are discharged, your  primary care physician will handle any further medical issues. Please note that NO REFILLS for any discharge medications will be authorized once you are discharged, as it is imperative that you return to your primary care physician (or establish a relationship with a primary care physician if you do not have one) for your aftercare needs so that they can reassess your need for medications and monitor your lab values. If you do not have a primary care physician, you can call [phone] for a physician referral.   Increase activity slowly   Complete by: As directed          Allergies as of 05/21/2022       Reactions   Codeine Itching   Latex Itching        Medication List     STOP taking these medications    amLODipine 10 MG tablet Commonly known as: NORVASC   tadalafil (PAH) 20 MG tablet Commonly known as: ADCIRCA       TAKE these medications    Accu-Chek Softclix Lancets lancets Use as directed to test blood sugar once daily.   aspirin EC 81 MG tablet Take 1 tablet (81 mg total) by mouth daily. Swallow whole. What changed: additional instructions   atorvastatin 20 MG tablet Commonly known as: LIPITOR Take 1 tablet (20 mg total) by mouth daily.   carvedilol 25 MG tablet Commonly known as: COREG TAKE 1 TABLET (25 MG TOTAL) BY MOUTH 2 (TWO) TIMES DAILY WITH A MEAL. What changed: how much to take   clopidogrel 75 MG tablet Commonly known as: PLAVIX Take 1 tablet (75 mg total) by mouth daily.   famotidine 20 MG tablet Commonly known as: PEPCID Take 1 tablet (20 mg total) by mouth daily.   Farxiga 10 MG Tabs tablet Generic drug: dapagliflozin propanediol Take 1 tablet (10 mg total) by mouth once daily before breakfast.   furosemide 40 MG tablet Commonly known as: LASIX TAKE 1 TABLET (40 MG TOTAL) BY MOUTH ONCE DAILY. What changed: how much to take   losartan 100 MG tablet Commonly known as: COZAAR Take 1 tablet (100 mg total) by mouth once daily.    metFORMIN 500 MG tablet Commonly known as: GLUCOPHAGE Take 1 tablet (500 mg total) by mouth 2 (two) times daily with a meal.   polyethylene glycol 17 g packet Commonly known as: MIRALAX / GLYCOLAX Take 17 g by mouth daily. Start taking on: May 22, 2022   potassium chloride 8 MEQ tablet Commonly known as: KLOR-CON Take 2 tablets (16 mEq total) by mouth 2 (two) times daily.   senna-docusate 8.6-50 MG tablet Commonly known as: Senokot-S Take 2 tablets by mouth 2 (two) times daily.   spironolactone 25 MG tablet Commonly known as: Aldactone Take 1 tablet (25 mg total) by mouth once daily.   thiamine 100 MG tablet Commonly known as: Vitamin B-1 Take 1 tablet (100 mg total) by mouth daily.   traMADol 50 MG tablet Commonly known as: ULTRAM Take 1 tablet (50 mg total) by mouth every 12 (twelve) hours as needed for severe pain.         Follow-up Information     [name], MD. Schedule an appointment as soon as possible for a visit in 1 week(s).   Specialty: Pulmonary Disease Why: post hospitalization follow up Contact information:  [address] [place] [address] [phone]                 TOTAL DISCHARGE TIME: 25 minutes  [place]  [facility] Pager on [email or web address]  05/21/2022, 9:51 AM

## 2022-05-22 DIAGNOSIS — M6281 Muscle weakness (generalized): Secondary | ICD-10-CM | POA: Diagnosis not present

## 2022-05-22 DIAGNOSIS — F1021 Alcohol dependence, in remission: Secondary | ICD-10-CM | POA: Diagnosis not present

## 2022-05-22 DIAGNOSIS — R7989 Other specified abnormal findings of blood chemistry: Secondary | ICD-10-CM | POA: Diagnosis not present

## 2022-05-22 DIAGNOSIS — K5900 Constipation, unspecified: Secondary | ICD-10-CM | POA: Diagnosis not present

## 2022-05-22 DIAGNOSIS — Z8619 Personal history of other infectious and parasitic diseases: Secondary | ICD-10-CM | POA: Diagnosis not present

## 2022-05-22 DIAGNOSIS — I10 Essential (primary) hypertension: Secondary | ICD-10-CM | POA: Diagnosis not present

## 2022-05-22 DIAGNOSIS — I639 Cerebral infarction, unspecified: Secondary | ICD-10-CM | POA: Diagnosis not present

## 2022-05-22 DIAGNOSIS — I69359 Hemiplegia and hemiparesis following cerebral infarction affecting unspecified side: Secondary | ICD-10-CM | POA: Diagnosis not present

## 2022-05-22 DIAGNOSIS — E119 Type 2 diabetes mellitus without complications: Secondary | ICD-10-CM | POA: Diagnosis not present

## 2022-05-22 DIAGNOSIS — K7460 Unspecified cirrhosis of liver: Secondary | ICD-10-CM | POA: Diagnosis not present

## 2022-05-22 DIAGNOSIS — K219 Gastro-esophageal reflux disease without esophagitis: Secondary | ICD-10-CM | POA: Diagnosis not present

## 2022-05-22 DIAGNOSIS — I5022 Chronic systolic (congestive) heart failure: Secondary | ICD-10-CM | POA: Diagnosis not present

## 2022-05-22 LAB — ANTIPHOSPHOLIPID SYNDROME EVAL, BLD
Anticardiolipin IgA: <9 APL U/mL (ref 0–11)
Anticardiolipin IgG: <9 GPL U/mL (ref 0–14)
Anticardiolipin IgM: <9 MPL U/mL (ref 0–12)
DRVVT: 40 s (ref 0.0–47.0)
PTT Lupus Anticoagulant: 36.5 s (ref 0.0–43.5)
Phosphatydalserine, IgA: 3 APS Units (ref 0–19)
Phosphatydalserine, IgG: 9 Units (ref 0–30)
Phosphatydalserine, IgM: 47 Units — ABNORMAL HIGH (ref 0–30)

## 2022-05-22 LAB — GLUCOSE, CAPILLARY
Glucose-Capillary: 143 mg/dL — ABNORMAL HIGH (ref 70–99)
Glucose-Capillary: 162 mg/dL — ABNORMAL HIGH (ref 70–99)

## 2022-05-22 LAB — PROTEIN C, TOTAL: Protein C, Total: 80 % (ref 60–150)

## 2022-05-22 MED ORDER — BISACODYL 10 MG RE SUPP
10.0000 mg | Freq: Once | RECTAL | Status: DC
  Filled 2022-05-22: qty 1

## 2022-05-22 NOTE — Discharge Summary (Signed)
[facility]  Physician Discharge Summary   Patient ID: Austin Mercer. MRN: [id number] DOB/AGE: [birth year] 70 y.o.  Admit date: 05/16/2022 Discharge date:   05/22/2022   PCP: [name], MD  DISCHARGE DIAGNOSES:    Acute ischemic stroke [facility])   Controlled type 2 diabetes mellitus without complication, without long-term current use of insulin ([place])   Essential hypertension   Class 3 severe obesity due to excess calories with serious comorbidity and body mass index (BMI) of 45.0 to 49.9 in adult [facility])   Hepatitis C virus infection cured after antiviral drug therapy   History of alcohol use disorder   Liver cirrhosis (HCC)   CVA (cerebral vascular accident) ([place])   RECOMMENDATIONS FOR OUTPATIENT FOLLOW UP: Please check CBC and basic metabolic panel in 3 to 4 days Ambulatory referral sent to neurology for outpatient follow-up   Home Health: SNF Equipment/Devices: None  CODE STATUS: Full code  DISCHARGE CONDITION: fair  Diet recommendation: Modified carbohydrate/heart healthy  INITIAL HISTORY:  72 y.o. male with history h/o obesity, diabetes mellitus type 2, hypertension who was recently admitted to this facility for CVA-bilateral embolic stroke s/p tnkase and discharged home with DAPT, presented with complaints of worsening right leg weakness and inability to walk.  MRI revealed new strokes.  He was hospitalized for further management.     Consultants: Neurology   Procedures: None    HOSPITAL COURSE:   Acute stroke Recently hospitalized for stroke and was discharged on dual antiplatelet treatment with aspirin and Plavix.  Presented again with new weakness especially in the right lower extremity.  MRI shows new stroke Neurology is following.  They are considering loop recorder placement.  May need to be done in the outpatient setting and will defer to neurology. Patient had extensive neurological workup during his admission recently for his previous  stroke. LDL is 59.  HbA1c 6.2. Patient is on aspirin and Plavix.  Also on statin. PT and OT evaluation.  SNF is recommended for short-term rehab.   Chronic systolic CHF/essential hypertension Patient does have bilateral lower extremity edema and is on diuretics at home.  Recent echocardiogram showed normal left ventricular ejection fraction. Continue with home medications.  He is noted to be on carvedilol, furosemide and spironolactone.  Also on ARB which can be resumed at discharge.  Renal function will need to be monitored closely.   Hyperlipidemia Continue with statin   Diabetes mellitus type 2 Continue with home occasions.   History of hepatitis C/liver cirrhosis He has been treated with hepatitis C with antivirals.  Seems to be stable. No concerns identified on CT scan.   Chronic alcohol use previously Last drink was 2 weeks prior to admission.  No withdrawal symptoms noted.  Continue with thiamine.   Elevated creatinine Creatinine noted to be elevated for the last few days including previous hospitalization.  No known history of chronic kidney disease but he could have Chronic kidney disease stage II-IIIa.  Will need further monitoring in the outpatient setting.  Constipation Patient reported no bowel movement in several days.  Started on laxatives and stool softeners.  Was given suppository with a bowel movement this morning.  Continue with bowel regimen for now.   Morbid obesity Estimated body mass index is 45.61 kg/m as calculated from the following:   Height as of this encounter: 6' 1"$  (1.854 m).   Weight as of this encounter: 156.8 kg.    Patient is stable.  Had a bowel movement this morning.  Remains  stable for discharge to SNF when bed is available.    PERTINENT LABS:  The results of significant diagnostics from this hospitalization (including imaging, microbiology, ancillary and laboratory) are listed below for reference.      Labs:   Basic Metabolic  Panel: Recent Labs  Lab 05/16/22 1547 05/18/22 0626 05/19/22 0648  NA 133* 132* 133*  K 4.7 4.4 4.5  CL 104 103 102  CO2 21* 23 22  GLUCOSE 144* 141* 125*  BUN 19 21 24*  CREATININE 1.64* 1.55* 1.54*  CALCIUM 10.6* 10.7* 10.8*  MG 1.9  --   --     Liver Function Tests: Recent Labs  Lab 05/16/22 1547  AST 25  ALT 22  ALKPHOS 50  BILITOT 0.3  PROT 7.6  ALBUMIN 3.9     CBC: Recent Labs  Lab 05/16/22 1547 05/18/22 0626  WBC 7.3 6.0  NEUTROABS 3.2  --   HGB 13.6 13.3  HCT 41.7 40.3  MCV 82.7 80.4  PLT 141* 135*      CBG: Recent Labs  Lab 05/21/22 0614 05/21/22 1141 05/21/22 1625 05/21/22 2124 05/22/22 0613  GLUCAP 133* 114* 151* 125* 143*      IMAGING STUDIES CT CHEST ABDOMEN PELVIS W CONTRAST  Result Date: 05/17/2022 CLINICAL DATA:  Assessment for occult malignancy. History of diabetes and hypertension. EXAM: CT CHEST, ABDOMEN, AND PELVIS WITH CONTRAST TECHNIQUE: Multidetector CT imaging of the chest, abdomen and pelvis was performed following the standard protocol during bolus administration of intravenous contrast. RADIATION DOSE REDUCTION: This exam was performed according to the departmental dose-optimization program which includes automated exposure control, adjustment of the mA and/or kV according to patient size and/or use of iterative reconstruction technique. CONTRAST:  146m OMNIPAQUE IOHEXOL 350 MG/ML SOLN COMPARISON:  Abdominopelvic CT 01/12/2022. Chest radiographs 05/16/2022. FINDINGS: CT CHEST FINDINGS Cardiovascular: No acute vascular findings. Mild atherosclerosis of the aorta, great vessels and coronary arteries. There is a small anterior pericardial effusion versus pericardial thickening. The heart size is normal. Mediastinum/Nodes: There are no enlarged mediastinal, hilar or axillary lymph nodes. Small mediastinal and axillary lymph nodes are noted bilaterally. The thyroid gland, trachea and esophagus demonstrate no significant findings.  Lungs/Pleura: No pleural effusion or pneumothorax. No suspicious pulmonary nodules. Musculoskeletal/Chest wall: No chest wall mass or suspicious osseous findings. Mild spondylosis. CT ABDOMEN AND PELVIS FINDINGS Hepatobiliary: The liver is normal in density without suspicious focal abnormality. No evidence of gallstones, gallbladder wall thickening or biliary dilatation. Pancreas: Unremarkable. No pancreatic ductal dilatation or surrounding inflammatory changes. Spleen: Normal in size without focal abnormality. Adrenals/Urinary Tract: Both adrenal glands appear normal. No evidence of urinary tract calculus, suspicious renal lesion or hydronephrosis. The bladder appears normal for its degree of distention. Stomach/Bowel: No enteric contrast administered. The stomach appears unremarkable for its degree of distension. No evidence of bowel wall thickening, distention or surrounding inflammatory change. The appendix appears normal. Vascular/Lymphatic: There are no enlarged abdominal or pelvic lymph nodes. Aortic and branch vessel atherosclerosis without evidence of aneurysm or large vessel occlusion. Reproductive: The prostate gland and seminal vesicles appear unremarkable. Other: No evidence of abdominal wall mass or hernia. No ascites. Musculoskeletal: No acute osseous findings. Multilevel lumbar spondylosis with chronic severe foraminal narrowing bilaterally at L5-S1. Probable central canal stenosis in the lower lumbar spine. IMPRESSION: 1. No acute findings or evidence of malignancy within the chest, abdomen or pelvis. 2. Small anterior pericardial effusion versus pericardial thickening. 3. Multilevel lumbar spondylosis with chronic severe foraminal narrowing bilaterally at L5-S1. 4.  Aortic Atherosclerosis (ICD10-I70.0). Electronically Signed   By: Richardean Sale M.D.   On: 05/17/2022 10:38   MR BRAIN WO CONTRAST  Result Date: 05/17/2022 CLINICAL DATA:  Stroke suspected, increased bilateral leg weakness EXAM:  MRI HEAD WITHOUT CONTRAST TECHNIQUE: Multiplanar, multiecho pulse sequences of the brain and surrounding structures were obtained without intravenous contrast. COMPARISON:  05/13/2021 FINDINGS: Brain: New area of restricted diffusion with ADC correlate in the left pons (series 2, images 15-17 and series 3, image 17), consistent with acute infarct. Other foci of mildly increased signal on diffusion-weighted imaging bilateral occipital lobes and left parietal lobe were present on the prior exam, likely subacute infarcts. No acute hemorrhage, mass, mass effect, or midline shift. No hydrocephalus or extra-axial collection. T2 hyperintense signal in the periventricular white matter, likely the sequela of moderate chronic small vessel ischemic disease. Remote lacunar infarcts in the right greater than left basal ganglia. Vascular: Normal arterial flow voids. Skull and upper cervical spine: Normal marrow signal. Sinuses/Orbits: No acute finding. Other: Trace fluid in right mastoid air cells. IMPRESSION: 1. 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 2. Redemonstrated subacute infarcts in the bilateral occipital lobes and left parietal lobe. These results were called by telephone at the time of interpretation on 05/16/2022 at 11:59 pm to provider POLLINA, who verbally acknowledged these results. Electronically Signed   By: Merilyn Baba M.D.   On: 05/17/2022 00:02   CT Head Wo Contrast  Result Date: 05/16/2022 CLINICAL DATA:  Recent stroke with increased bilateral leg weakness unable to stand EXAM: CT HEAD WITHOUT CONTRAST TECHNIQUE: Contiguous axial images were obtained from the base of the skull through the vertex without intravenous contrast. RADIATION DOSE REDUCTION: This exam was performed according to the departmental dose-optimization program which includes automated exposure control, adjustment of the mA and/or kV according to patient size and/or use of iterative reconstruction technique. COMPARISON:  CT 05/12/2022, MRI  05/13/2022 FINDINGS: Brain: No acute territorial infarction, hemorrhage, or intracranial mass. Atrophy and chronic small vessel ischemic changes of the white matter. 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 Recently demonstrated small parietooccipital cortical infarcts on MRI are not well seen by CT. The ventricles are stable in size. Vascular: No hyperdense vessels.  Carotid vascular calcification Skull: Normal. Negative for fracture or focal lesion. Sinuses/Orbits: No acute finding. Other: None IMPRESSION: 1. No CT evidence for acute intracranial abnormality. 2. Atrophy and chronic small vessel ischemic changes of the white matter. 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 Recently demonstrated small cortical infarcts on MRI are not well seen by CT Electronically Signed   By: Donavan Foil M.D.   On: 05/16/2022 17:30   DG Chest 1 View  Result Date: 05/16/2022 CLINICAL DATA:  Pt having leg and arm weakness, feeling overall weak since yesterday - hx of stroke, htn, diabetes EXAM: CHEST  1 VIEW COMPARISON:  03/06/2020 FINDINGS: Lungs are clear. Heart size and mediastinal contours are within normal limits. Aortic Atherosclerosis (ICD10-170.0). No effusion. Visualized bones unremarkable. IMPRESSION: No acute cardiopulmonary disease. Electronically Signed   By: Lucrezia Europe M.D.   On: 05/16/2022 16:12   VAS [place] LOWER EXTREMITY VENOUS (DVT)  Result Date: 05/13/2022  Lower Venous DVT Study Patient Name:  Edder Thomlinson.  Date of Exam:   05/13/2022 Medical Rec #: [id number]         Accession #:    [id number] Date of Birth: 06/10/1950         Patient  Gender: M Patient Age:   26 years Exam Location:  [facility] Procedure:      VAS [place] LOWER EXTREMITY VENOUS (DVT) Referring Phys: Janine Ores --------------------------------------------------------------------------------  Indications: Stroke.  Limitations: Body habitus and poor  ultrasound/tissue interface. Comparison Study: No previous exams Performing Technologist: Jody Hill RVT, RDMS  Examination Guidelines: A complete evaluation includes B-mode imaging, spectral Doppler, color Doppler, and power Doppler as needed of all accessible portions of each vessel. Bilateral testing is considered an integral part of a complete examination. Limited examinations for reoccurring indications may be performed as noted. The reflux portion of the exam is performed with the patient in reverse Trendelenburg.  +---------+---------------+---------+-----------+----------+-------------------+ RIGHT    CompressibilityPhasicitySpontaneityPropertiesThrombus Aging      +---------+---------------+---------+-----------+----------+-------------------+ CFV      Full           Yes      Yes                                      +---------+---------------+---------+-----------+----------+-------------------+ SFJ      Full                                                             +---------+---------------+---------+-----------+----------+-------------------+ FV Prox  Full           Yes      Yes                                      +---------+---------------+---------+-----------+----------+-------------------+ FV Mid   Full           Yes      Yes                                      +---------+---------------+---------+-----------+----------+-------------------+ FV DistalFull           Yes      Yes                                      +---------+---------------+---------+-----------+----------+-------------------+ PFV      Full                                                             +---------+---------------+---------+-----------+----------+-------------------+ POP      Full           Yes      Yes                                      +---------+---------------+---------+-----------+----------+-------------------+ PTV      Full                                                              +---------+---------------+---------+-----------+----------+-------------------+  PERO     Full                                         Not well visualized +---------+---------------+---------+-----------+----------+-------------------+   +---------+---------------+---------+-----------+----------+--------------+ LEFT     CompressibilityPhasicitySpontaneityPropertiesThrombus Aging +---------+---------------+---------+-----------+----------+--------------+ CFV      Full           Yes      Yes                                 +---------+---------------+---------+-----------+----------+--------------+ SFJ      Full                                                        +---------+---------------+---------+-----------+----------+--------------+ FV Prox  Full           Yes      Yes                                 +---------+---------------+---------+-----------+----------+--------------+ FV Mid   Full           Yes      Yes                                 +---------+---------------+---------+-----------+----------+--------------+ FV DistalFull           Yes      Yes                                 +---------+---------------+---------+-----------+----------+--------------+ PFV      Full                                                        +---------+---------------+---------+-----------+----------+--------------+ POP      Full           Yes      Yes                                 +---------+---------------+---------+-----------+----------+--------------+ PTV      Full                                                        +---------+---------------+---------+-----------+----------+--------------+ PERO                                                  Not visualized +---------+---------------+---------+-----------+----------+--------------+   Left Technical Findings: Not visualized segments include Peroneal veins.    Summary: BILATERAL: - No evidence of deep vein  thrombosis seen in the lower extremities, bilaterally. -No evidence of popliteal cyst, bilaterally.   *See table(s) above for measurements and observations. Electronically signed by Harold Barban MD on 05/13/2022 at 4:51:38 PM.    Final    MR BRAIN WO CONTRAST  Result Date: 05/13/2022 CLINICAL DATA:  Stroke, follow up. EXAM: MRI HEAD WITHOUT CONTRAST TECHNIQUE: Multiplanar, multiecho pulse sequences of the brain and surrounding structures were obtained without intravenous contrast. COMPARISON:  Head CT 05/12/2022 and MRI 05/11/2022 FINDINGS: At the request of the inpatient stroke team, only axial and coronal diffusion weighted imaging was performed. There are a few subcentimeter acute bilateral parieto-occipital cortical infarcts which were not present on the recent prior MRI. Chronic small vessel ischemia is again noted including chronic lacunar infarcts in the bilateral basal ganglia and deep white matter, more fully evaluated on the recent complete MRI. No intracranial mass effect or extra-axial fluid collection is evident on this abbreviated examination. IMPRESSION: Small acute bilateral parieto-occipital cortical infarcts. Electronically Signed   By: Logan Bores M.D.   On: 05/13/2022 11:43   CT HEAD WO CONTRAST (5MM)  Result Date: 05/12/2022 CLINICAL DATA:  Stroke, follow-up. EXAM: CT HEAD WITHOUT CONTRAST TECHNIQUE: Contiguous axial images were obtained from the base of the skull through the vertex without intravenous contrast. RADIATION DOSE REDUCTION: This exam was performed according to the departmental dose-optimization program which includes automated exposure control, adjustment of the mA and/or kV according to patient size and/or use of iterative reconstruction technique. COMPARISON:  Head CT 05/11/2022.  MRI brain 05/11/2022. FINDINGS: Brain: No acute intracranial hemorrhage. Unchanged findings of chronic small-vessel disease with old lacunar  infarcts in the bilateral basal ganglia, right external capsule/corona radiata and right internal capsule. Gray-white differentiation is otherwise preserved. No hydrocephalus or extra-axial collection. Vascular: No hyperdense vessel or unexpected calcification. Skull: No calvarial fracture or suspicious bone lesion. Skull base is unremarkable. Sinuses/Orbits: Unremarkable. Other: None. IMPRESSION: 1. No acute intracranial abnormality. 2. Unchanged findings of chronic small-vessel disease. Electronically Signed   By: Emmit Alexanders M.D.   On: 05/12/2022 08:53   ECHOCARDIOGRAM COMPLETE  Result Date: 05/11/2022    ECHOCARDIOGRAM REPORT   Patient Name:   Dustyn Bellis. Date of Exam: 05/11/2022 Medical Rec #:  [id number]        Height:       73.0 in Accession #:    [id number]       Weight:       360.0 lb Date of Birth:  March 31, 1951        BSA:          2.762 m Patient Age:    47 years         BP:           195/104 mmHg Patient Gender: M                HR:           60 bpm. Exam Location:  Inpatient Procedure: 2D Echo, Cardiac Doppler and Color Doppler Indications:    Stroke  History:        Patient has no prior history of Echocardiogram examinations.                 Abnormal ECG, Stroke, Arrythmias:AV block,                 Signs/Symptoms:Dizziness/Lightheadedness; Risk                 Factors:Hypertension and Diabetes. ETOH.  Sonographer:    Roseanna Rainbow RDCS Referring Phys: Amie Portland  Sonographer Comments: Technically difficult study due to poor echo windows and patient is obese. Image acquisition challenging due to patient body habitus. IMPRESSIONS  1. Left ventricular ejection fraction, by estimation, is 55 to 60%. The left ventricle has normal function. The left ventricle has no regional wall motion abnormalities. There is moderate left ventricular hypertrophy. Left ventricular diastolic parameters are consistent with Grade I diastolic dysfunction (impaired relaxation).  2. Right ventricular systolic function is  normal. The right ventricular size is normal.  3. The mitral valve is normal in structure. Mild mitral valve regurgitation. No evidence of mitral stenosis.  4. The aortic valve has an indeterminant number of cusps. Aortic valve regurgitation is moderate. Mild aortic valve stenosis.  5. Aortic dilatation noted. There is borderline dilatation of the aortic root, measuring 39 mm.  6. The inferior vena cava is normal in size with greater than 50% respiratory variability, suggesting right atrial pressure of 3 mmHg. Comparison(s): No prior Echocardiogram. FINDINGS  Left Ventricle: Left ventricular ejection fraction, by estimation, is 55 to 60%. The left ventricle has normal function. The left ventricle has no regional wall motion abnormalities. The left ventricular internal cavity size was normal in size. There is  moderate left ventricular hypertrophy. Left ventricular diastolic parameters are consistent with Grade I diastolic dysfunction (impaired relaxation). Right Ventricle: The right ventricular size is normal. Right ventricular systolic function is normal. Left Atrium: Left atrial size was normal in size. Right Atrium: Right atrial size was normal in size. Pericardium: Trivial pericardial effusion is present. Mitral Valve: The mitral valve is normal in structure. Mild mitral valve regurgitation. No evidence of mitral valve stenosis. Tricuspid Valve: The tricuspid valve is normal in structure. Tricuspid valve regurgitation is trivial. No evidence of tricuspid stenosis. Aortic Valve: The aortic valve has an indeterminant number of cusps. Aortic valve regurgitation is moderate. Aortic regurgitation PHT measures 709 msec. Mild aortic stenosis is present. Aortic valve mean gradient measures 16.0 mmHg. Aortic valve peak gradient measures 30.6 mmHg. Aortic valve area, by VTI measures 2.89 cm. Pulmonic Valve: The pulmonic valve was not well visualized. Pulmonic valve regurgitation is not visualized. No evidence of pulmonic  stenosis. Aorta: Aortic dilatation noted. There is borderline dilatation of the aortic root, measuring 39 mm. Venous: The inferior vena cava is normal in size with greater than 50% respiratory variability, suggesting right atrial pressure of 3 mmHg. IAS/Shunts: No atrial level shunt detected by color flow Doppler.  LEFT VENTRICLE PLAX 2D LVIDd:         5.10 cm      Diastology LVIDs:         3.30 cm      LV e' medial:    5.98 cm/s LV PW:         1.40 cm      LV E/e' medial:  12.5 LV IVS:        1.80 cm      LV e' lateral:   6.96 cm/s LVOT diam:     2.80 cm      LV E/e' lateral: 10.7 LV SV:         166 LV SV Index:   60 LVOT Area:     6.16 cm  LV Volumes (MOD) LV vol d, MOD A2C: 229.0 ml LV vol d, MOD A4C: 176.0 ml LV vol s, MOD A2C: 71.5 ml LV vol s, MOD A4C: 72.7 ml LV SV MOD A2C:  157.5 ml LV SV MOD A4C:     176.0 ml LV SV MOD BP:      140.8 ml RIGHT VENTRICLE             IVC RV S prime:     16.90 cm/s  IVC diam: 2.10 cm TAPSE (M-mode): 1.9 cm LEFT ATRIUM             Index        RIGHT ATRIUM           Index LA diam:        3.60 cm 1.30 cm/m   RA Area:     12.50 cm LA Vol (A2C):   78.0 ml 28.24 ml/m  RA Volume:   25.50 ml  9.23 ml/m LA Vol (A4C):   60.7 ml 21.98 ml/m LA Biplane Vol: 69.9 ml 25.31 ml/m  AORTIC VALVE AV Area (Vmax):    2.83 cm AV Area (Vmean):   2.74 cm AV Area (VTI):     2.89 cm AV Vmax:           276.67 cm/s AV Vmean:          182.667 cm/s AV VTI:            0.573 m AV Peak Grad:      30.6 mmHg AV Mean Grad:      16.0 mmHg LVOT Vmax:         127.00 cm/s LVOT Vmean:        81.400 cm/s LVOT VTI:          0.269 m LVOT/AV VTI ratio: 0.47 AI PHT:            709 msec  AORTA Ao Root diam: 3.90 cm Ao Asc diam:  3.80 cm MITRAL VALVE MV Area (PHT): 2.80 cm     SHUNTS MV Decel Time: 271 msec     Systemic VTI:  0.27 m MV E velocity: 74.80 cm/s   Systemic Diam: 2.80 cm MV A velocity: 101.15 cm/s MV E/A ratio:  0.74 Kirk Ruths MD Electronically signed by Kirk Ruths MD Signature Date/Time:  05/11/2022/1:48:03 PM    Final    EEG adult  Result Date: 05/11/2022 Lora Havens, MD     05/11/2022 12:16 PM Patient Name: Austin Mercer. MRN: [id number] Epilepsy Attending: Lora Havens Referring Physician/Provider: August Albino, NP Date: 05/11/2022 Duration: 23.38 mins Patient history: 72 year old male who initially presented with right-sided numbness, tingling and dizziness. He suddenly became globally aphasic, had worsening right-sided weakness, right-sided neglect and left gaze preference.  EEG to evaluate for seizure. Level of alertness: Awake,asleep AEDs during EEG study: None Technical aspects: This EEG study was done with scalp electrodes positioned according to the 10-20 International system of electrode placement. Electrical activity was reviewed with band pass filter of 1-70Hz$ , sensitivity of 7 uV/mm, display speed of 17m/sec with a 60Hz$  notched filter applied as appropriate. EEG data were recorded continuously and digitally stored.  Video monitoring was available and reviewed as appropriate. Description: The posterior dominant rhythm consists of 8 Hz activity of moderate voltage (25-35 uV) seen predominantly in posterior head regions, symmetric and reactive to eye opening and eye closing. Sleep was characterized by vertex waves, sleep spindles (12 to 14 Hz), maximal frontocentral region.  EEG showed continuous generalized 3 to 7 Hz theta-delta slowing. Hyperventilation and photic stimulation were not performed.   ABNORMALITY - Continuous slow, generalized IMPRESSION: This study is suggestive of moderate diffuse encephalopathy, nonspecific  etiology. No seizures or epileptiform discharges were seen throughout the recording. Lora Havens   MR BRAIN WO CONTRAST  Result Date: 05/11/2022 CLINICAL DATA:  72 year old male code stroke presentation status post TNK for right side deficits. EXAM: MRI HEAD WITHOUT CONTRAST TECHNIQUE: Multiplanar, multiecho pulse sequences of the brain and  surrounding structures were obtained without intravenous contrast. COMPARISON:  CTA head and neck, head CTs today. FINDINGS: Brain: Study is mildly degraded by motion artifact despite repeated imaging attempts. No convincing diffusion restriction. 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 Mild midbrain Wallerian degeneration. Mild contralateral left deep gray nuclei chronic small vessel disease. Scattered mild additional bilateral white matter T2 and FLAIR hyperintensity. No cortical encephalomalacia or chronic cerebral blood products identified. No midline shift, mass effect, evidence of mass lesion, ventriculomegaly, extra-axial collection or acute intracranial hemorrhage. Cervicomedullary junction and pituitary are within normal limits. Vascular: Major intracranial vascular flow voids are preserved. Skull and upper cervical spine: Negative. Sinuses/Orbits: Coronal T2 weighted imaging not provided. Orbit motion artifact. Paranasal sinuses and mastoids are stable and well aerated. Other: Grossly negative visible internal auditory structures. IMPRESSION: 1. Intermittently motion degraded exam. No acute intracranial abnormality identified. 2. 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 Electronically Signed   By: Genevie Ann M.D.   On: 05/11/2022 10:42   CT HEAD WO CONTRAST (5MM)  Result Date: 05/11/2022 CLINICAL DATA:  Hypertension EXAM: CT HEAD WITHOUT CONTRAST TECHNIQUE: Contiguous axial images were obtained from the base of the skull through the vertex without intravenous contrast. RADIATION DOSE REDUCTION: This exam was performed according to the departmental dose-optimization program which includes automated exposure control, adjustment of the mA and/or kV according to patient size and/or use of iterative reconstruction technique. COMPARISON:  Same day CT at 0902 hours FINDINGS: Brain: No evidence of acute infarction,  hemorrhage, hydrocephalus, extra-axial collection or mass lesion/mass effect. Moderate low-density changes within the periventricular and subcortical white matter compatible with chronic microvascular ischemic change. Mild diffuse cerebral volume loss. Vascular: Atherosclerotic calcifications involving the large vessels of the skull base. No unexpected hyperdense vessel. Skull: Normal. Negative for fracture or focal lesion. Sinuses/Orbits: No acute finding. Other: None. IMPRESSION: No acute intracranial abnormality. No interval change from the recently performed study. Electronically Signed   By: Davina Poke D.O.   On: 05/11/2022 10:01   CT ANGIO HEAD NECK W WO CM (CODE STROKE)  Result Date: 05/11/2022 CLINICAL DATA:  Code stroke. 72 year old male right side deficit. Status post TNK. EXAM: CT ANGIOGRAPHY HEAD AND NECK TECHNIQUE: Multidetector CT imaging of the head and neck was performed using the standard protocol during bolus administration of intravenous contrast. Multiplanar CT image reconstructions and MIPs were obtained to evaluate the vascular anatomy. Carotid stenosis measurements (when applicable) are obtained utilizing NASCET criteria, using the distal internal carotid diameter as the denominator. RADIATION DOSE REDUCTION: This exam was performed according to the departmental dose-optimization program which includes automated exposure control, adjustment of the mA and/or kV according to patient size and/or use of iterative reconstruction technique. CONTRAST:  52m OMNIPAQUE IOHEXOL 350 MG/ML SOLN COMPARISON:  Plain head CT this morning. FINDINGS: CTA NECK Skeleton: Widespread cervical spine degeneration, bulky disc and endplate disease. No acute osseous abnormality identified. Upper chest: Negative lung apices. Visible superior mediastinal lymph nodes are within normal limits. Other neck: Retropharyngeal course of both carotid arteries in the neck. Aortic arch: Calcified aortic atherosclerosis.  Bovine arch configuration. Right carotid system: No brachiocephalic  artery plaque or stenosis. Tortuous right CCA origin without stenosis. Retropharyngeal right CCA and right carotid bifurcation with only mild right ICA plaque and no stenosis. Left carotid system: Bovine left CCA origin with no plaque or stenosis. Similar tortuosity and retropharyngeal course with only mild calcified plaque at the bifurcation more affecting the ECA, no stenosis. Vertebral arteries: Calcified plaque and tortuosity of the right subclavian artery origin without significant stenosis. Right vertebral artery origin is patent without significant stenosis. Right vertebral artery is patent to the skull base with no significant plaque or stenosis. Proximal left subclavian artery and left vertebral artery origin are patent with no significant plaque or stenosis. Tortuous left V1 segment. Codominant left vertebral arteries patent to the skull base with no significant stenosis. CTA HEAD Posterior circulation: Codominant V4 segments and vertebrobasilar junction are patent with no significant plaque or stenosis. Bilateral AICA appear dominant, patent. Basilar artery is patent although with long segment mild proximal and mid basilar stenosis, series 10, image 32. Mild fusiform enlargement of the basilar tip. Basilar tip, SCA and left PCA origins are patent. Fetal type right PCA origin with atherosclerotic and stenotic right posterior communicating artery. Furthermore, the right PCA P2 segment is irregular and stenotic. See series 8, image 19. Distal right PCA branches remain patent. Contralateral up to moderate left PCA origin irregularity and stenosis. Superimposed moderate distal left P2 segment stenosis, and mild-to-moderate P3 superior division stenosis. Anterior circulation: Both ICA siphons are patent. Up to moderate calcified left siphon plaque but no significant left siphon stenosis. Similar moderate right siphon plaque, but only mild  supraclinoid right siphon stenosis. Patent carotid termini, MCA and ACA origins. Mild to moderate bilateral A1 segment irregularity and stenosis slightly greater on the right (series 10, image 30). Diminutive or absent anterior communicating artery. There is moderate to severe left ACA A2 segment stenosis on series 12, image 23 but the distal ACA branches appear to remain patent. Up to moderate distal right ACA branch irregularity and stenosis on series 12, image 23 also. Left MCA M1 segment is mildly irregular but patent to the bifurcation without significant stenosis. No left MCA branch occlusion is identified. Mild left MCA branch irregularity. Contralateral right MCA M1 is mildly tortuous and irregular without stenosis. Patent right MCA bifurcation. Mild M2 but moderate posterior right M3 or M4 branch irregularity and stenosis on series 12, image 15. Venous sinuses: Patent. Anatomic variants: Bovine arch configuration. Fetal type right PCA origin. Review of the MIP images confirms the above findings IMPRESSION: 1. Negative for large vessel occlusion. But Positive for advanced and widespread intracranial atherosclerosis: - highly stenotic Right fetal PCA. - moderate tandem stenoses of the contralateral Left PCA. - mild bilateral ACA A1 stenosis, and moderate to severe stenosis Left ACA A2 and right A3. - up to moderate Right MCA M3/4 branch stenoses. - mild stenoses of the Basilar artery, ICA siphons. 2.  Aortic Atherosclerosis (ICD10-I70.0). Salient findings Study discussed by telephone with Dr. Rory Percy on 05/11/2022 at 09:35. At 0924 hours Electronically Signed   By: Genevie Ann M.D.   On: 05/11/2022 09:37   CT HEAD CODE STROKE WO CONTRAST  Result Date: 05/11/2022 CLINICAL DATA:  Code stroke.  72 year old male right side deficit. EXAM: CT HEAD WITHOUT CONTRAST TECHNIQUE: Contiguous axial images were obtained from the base of the skull through the vertex without intravenous contrast. RADIATION DOSE REDUCTION: This  exam was performed according to the departmental dose-optimization program which includes automated exposure control, adjustment of the mA and/or  kV according to patient size and/or use of iterative reconstruction technique. COMPARISON:  Head CT 12/30/2011. FINDINGS: Brain: Cerebral volume loss since 2013 along with substantially progressed but chronic appearing small vessel ischemia in the right basal ganglia and internal capsule. Chronic appearing cystic encephalomalacia there. Asymmetric additional patchy cerebral white matter hypodensity has increased but is age indeterminate. No superimposed acute cortically based infarct identified. Vascular: Calcified atherosclerosis at the skull base. Skull: Negative. Sinuses/Orbits: Visualized paranasal sinuses and mastoids are stable and well aerated. Other: Orbits and scalp appears stable and essentially negative, there is a tiny midline forehead benign scalp lipoma. ASPECTS [facility] Stroke Program Early CT Score) Total score (0-10 with 10 being normal): 10 IMPRESSION: 1. No acute cortically based infarct or acute intracranial hemorrhage identified. ASPECTS 10. 2. But substantially new/progressed small vessel disease since 2013, especially in the right basal ganglia and internal capsule. 3. These results were communicated to Dr. Rory Percy at 9:12 am on 05/11/2022 by text page via the [facility] messaging system. Electronically Signed   By: Genevie Ann M.D.   On: 05/11/2022 09:13    DISCHARGE EXAMINATION: Vitals:   05/21/22 1920 05/21/22 2350 05/22/22 0522 05/22/22 0804  BP: 133/60 138/77 (!) 148/74 (!) 141/69  Pulse: (!) 48 (!) 59 (!) 51 (!) 55  Resp: 18 18 18 18  $ Temp: 97.9 F (36.6 C) 98.1 F (36.7 C) 97.9 F (36.6 C) 98 F (36.7 C)  TempSrc: Oral   Oral  SpO2: 99% 95% 99% 99%  Weight:      Height:       General appearance: Awake alert.  In no distress Resp: Clear to auscultation bilaterally.  Normal effort Cardio: S1-S2 is normal regular.  No S3-S4.  No rubs  murmurs or bruit GI: Abdomen is soft.  Nontender nondistended.  Bowel sounds are present normal.  No masses organomegaly    DISPOSITION: SNF  Discharge Instructions     Ambulatory referral to Neurology   Complete by: As directed    An appointment is requested in approximately: 8 weeks   Call MD for:  difficulty breathing, headache or visual disturbances   Complete by: As directed    Call MD for:  extreme fatigue   Complete by: As directed    Call MD for:  persistant dizziness or light-headedness   Complete by: As directed    Call MD for:  persistant nausea and vomiting   Complete by: As directed    Call MD for:  severe uncontrolled pain   Complete by: As directed    Call MD for:  temperature >100.4   Complete by: As directed    Diet - low sodium heart healthy   Complete by: As directed    Diet Carb Modified   Complete by: As directed    Discharge instructions   Complete by: As directed    Please review instructions on the discharge summary.  You were cared for by a hospitalist during your hospital stay. If you have any questions about your discharge medications or the care you received while you were in the hospital after you are discharged, you can call the unit and asked to speak with the hospitalist on call if the hospitalist that took care of you is not available. Once you are discharged, your primary care physician will handle any further medical issues. Please note that NO REFILLS for any discharge medications will be authorized once you are discharged, as it is imperative that you return to your primary care physician (  or establish a relationship with a primary care physician if you do not have one) for your aftercare needs so that they can reassess your need for medications and monitor your lab values. If you do not have a primary care physician, you can call [phone] for a physician referral.   Increase activity slowly   Complete by: As directed          Allergies as  of 05/22/2022       Reactions   Codeine Itching   Latex Itching        Medication List     STOP taking these medications    amLODipine 10 MG tablet Commonly known as: NORVASC   tadalafil (PAH) 20 MG tablet Commonly known as: ADCIRCA       TAKE these medications    Accu-Chek Softclix Lancets lancets Use as directed to test blood sugar once daily.   aspirin EC 81 MG tablet Take 1 tablet (81 mg total) by mouth daily. Swallow whole. What changed: additional instructions   atorvastatin 20 MG tablet Commonly known as: LIPITOR Take 1 tablet (20 mg total) by mouth daily.   carvedilol 25 MG tablet Commonly known as: COREG TAKE 1 TABLET (25 MG TOTAL) BY MOUTH 2 (TWO) TIMES DAILY WITH A MEAL. What changed: how much to take   clopidogrel 75 MG tablet Commonly known as: PLAVIX Take 1 tablet (75 mg total) by mouth daily.   famotidine 20 MG tablet Commonly known as: PEPCID Take 1 tablet (20 mg total) by mouth daily.   Farxiga 10 MG Tabs tablet Generic drug: dapagliflozin propanediol Take 1 tablet (10 mg total) by mouth once daily before breakfast.   furosemide 40 MG tablet Commonly known as: LASIX TAKE 1 TABLET (40 MG TOTAL) BY MOUTH ONCE DAILY. What changed: how much to take   losartan 100 MG tablet Commonly known as: COZAAR Take 1 tablet (100 mg total) by mouth once daily.   metFORMIN 500 MG tablet Commonly known as: GLUCOPHAGE Take 1 tablet (500 mg total) by mouth 2 (two) times daily with a meal.   polyethylene glycol 17 g packet Commonly known as: MIRALAX / GLYCOLAX Take 17 g by mouth daily.   potassium chloride 8 MEQ tablet Commonly known as: KLOR-CON Take 2 tablets (16 mEq total) by mouth 2 (two) times daily.   senna-docusate 8.6-50 MG tablet Commonly known as: Senokot-S Take 2 tablets by mouth 2 (two) times daily.   spironolactone 25 MG tablet Commonly known as: Aldactone Take 1 tablet (25 mg total) by mouth once daily.   thiamine 100 MG  tablet Commonly known as: Vitamin B-1 Take 1 tablet (100 mg total) by mouth daily.   traMADol 50 MG tablet Commonly known as: ULTRAM Take 1 tablet (50 mg total) by mouth every 12 (twelve) hours as needed for severe pain.         Follow-up Information     [name], MD. Schedule an appointment as soon as possible for a visit in 1 week(s).   Specialty: Pulmonary Disease Why: post hospitalization follow up Contact information: [address]  [place] [address] [phone]                 TOTAL DISCHARGE TIME: 31 minutes  [place]  [facility] Pager on [email or web address]  05/22/2022, 9:32 AM

## 2022-05-22 NOTE — TOC Transition Note (Signed)
Transition of Care [facility]) - CM/SW Discharge Note   Patient Details  Name: Austin Mercer. MRN: [id number] Date of Birth: 1950-08-13  [facility]) CM/SW Contact:  Geralynn Ochs, LCSW Phone Number: 05/22/2022, 11:21 AM   Clinical Narrative:   CSW obtained insurance approval for patient, and [facility] has bed available for patient. CSW sent discharge information, updated patient. Family will provide transport.   Nurse to call report to [phone], Room 3206.    Final next level of care: Skilled Nursing Facility Barriers to Discharge: Barriers Resolved   Patient Goals and CMS Choice CMS Medicare.gov Compare Post Acute Care list provided to:: Patient Choice offered to / list presented to : Patient  Discharge Placement                Patient chooses bed at: [facility] Patient to be transferred to facility by: Family Name of family member notified: Self Patient and family notified of of transfer: 05/22/22  Discharge Plan and Services Additional resources added to the After Visit Summary for       Post Acute Care Choice: [place]                               Social Determinants of Health (SDOH) Interventions SDOH Screenings   Food Insecurity: No Food Insecurity (05/17/2022)  Housing: Low Risk  (05/17/2022)  Transportation Needs: No Transportation Needs (05/17/2022)  Utilities: Not At Risk (05/17/2022)  Depression (PHQ2-9): Low Risk  (03/02/2022)  Tobacco Use: Medium Risk (05/16/2022)     Readmission Risk Interventions     No data to display

## 2022-05-22 NOTE — Progress Notes (Signed)
Report called to Nunzio Cory at [facility]. All questions were answered.

## 2022-05-22 NOTE — Progress Notes (Signed)
Physical Therapy Treatment Patient Details Name: Austin Mercer. MRN: [id number] DOB: 04-05-1950 Today's Date: 05/22/2022   History of Present Illness 72 yo male with onset of increased RLE weakness and pain was admitted 2/13 after having falls, notable edema on RLE knee.  Pt has reported inability to walk, after sliding down on BR floor.  Was just seen for new stroke three days prior to admit and sent home. 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 PMHx:  obesity, DM, HTN, CVA, HepC, EtOH abuse, cirrhosis, CHF    PT Comments    Pt requests to don shoes for ambulation today, needed mod A to do this. Allowed pt to attempt sit>stand without assist 3x unsuccessfully before giving min A to boost from recliner for pt to achieve standing. Pt ambulated 220' with min A and RW with decreased wt shift to R and low step height L as well as maintaining flexed trunk. Pt able to correct trunk flexion but has decreased R knee control when he does so to allowed some flexion during gait and took frequent static posture breaks. PT will continue to follow.    Recommendations for follow up therapy are one component of a multi-disciplinary discharge planning process, led by the attending physician.  Recommendations may be updated based on patient status, additional functional criteria and insurance authorization.  Follow Up Recommendations  Skilled nursing-short term rehab (<3 hours/day) Can patient physically be transported by private vehicle: No   Assistance Recommended at Discharge Frequent or constant Supervision/Assistance  Patient can return home with the following A lot of help with walking and/or transfers;A little help with bathing/dressing/bathroom;Assistance with cooking/housework;Direct supervision/assist for medications management;Direct supervision/assist for financial management;Assist for transportation;Help with  stairs or ramp for entrance   Equipment Recommendations  Rolling walker (2 wheels)    Recommendations for Other Services       Precautions / Restrictions Precautions Precautions: Fall Precaution Comments: R knee pain and weakness Restrictions Weight Bearing Restrictions: No     Mobility  Bed Mobility               General bed mobility comments: pt received in recliner    Transfers Overall transfer level: Needs assistance Equipment used: Rolling walker (2 wheels) Transfers: Sit to/from Stand Sit to Stand: Min assist           General transfer comment: worked on sit>stand from Environmental education officer. Pt attempted 3x without asisst and was not successful. Was able to rise with min A for power up.    Ambulation/Gait Ambulation/Gait assistance: Min assist Gait Distance (Feet): 220 Feet Assistive device: Rolling walker (2 wheels) Gait Pattern/deviations: Step-through pattern, Shuffle, Wide base of support, Decreased stance time - right Gait velocity: decreased Gait velocity interpretation: <1.8 ft/sec, indicate of risk for recurrent falls   General Gait Details: pt avoidant of RLE WB'ing so taking a quick, low shuffle step on L. Was able to correct with vc's until fatigued. Pt maintains trunk flexion but when he extends fully through hips he has decreased R knee control. Allowed him to maintain some trunk flexion with ambulation but had him take frequent static breaks to extend through hips with UE support for safety   Stairs             Wheelchair Mobility    Modified Rankin (Stroke Patients Only) Modified Rankin (Stroke Patients Only) Pre-Morbid Rankin Score: No symptoms Modified Rankin: Moderately severe disability  Balance Overall balance assessment: Needs assistance Sitting-balance support: No upper extremity supported, Feet supported Sitting balance-Leahy Scale: Good     Standing balance support: Bilateral upper extremity supported, Reliant on  assistive device for balance Standing balance-Leahy Scale: Poor Standing balance comment: reliant on UE support for safety                            Cognition Arousal/Alertness: Awake/alert Behavior During Therapy: WFL for tasks assessed/performed Overall Cognitive Status: Impaired/Different from baseline Area of Impairment: Following commands, Safety/judgement, Attention                   Current Attention Level: Selective Memory: Decreased short-term memory Following Commands: Follows one step commands consistently, Follows multi-step commands consistently, Follows multi-step commands with increased time Safety/Judgement: Decreased awareness of safety Awareness: Anticipatory, Intellectual Problem Solving: Slow processing, Requires verbal cues, Requires tactile cues General Comments: needed frequent vc's        Exercises General Exercises - Lower Extremity Long Arc Quad: AROM, Right, 10 reps, Other (comment) (with resistance to flexion and extension x5 secs each)    General Comments General comments (skin integrity, edema, etc.): HE 63 bpm, SPO2 96%      Pertinent Vitals/Pain Pain Assessment Pain Assessment: Faces Faces Pain Scale: No hurt    Home Living                          Prior Function            PT Goals (current goals can now be found in the care plan section) Acute Rehab PT Goals Patient Stated Goal: get home and to a new location if possible PT Goal Formulation: With patient Time For Goal Achievement: 05/31/22 Potential to Achieve Goals: Good Progress towards PT goals: Progressing toward goals    Frequency    Min 3X/week      PT Plan Current plan remains appropriate    Co-evaluation              AM-PAC PT "6 Clicks" Mobility   Outcome Measure  Help needed turning from your back to your side while in a flat bed without using bedrails?: A Little Help needed moving from lying on your back to sitting on the  side of a flat bed without using bedrails?: A Little Help needed moving to and from a bed to a chair (including a wheelchair)?: A Little Help needed standing up from a chair using your arms (e.g., wheelchair or bedside chair)?: A Little Help needed to walk in hospital room?: A Little Help needed climbing 3-5 steps with a railing? : Total 6 Click Score: 16    End of Session Equipment Utilized During Treatment: Gait belt Activity Tolerance: Patient tolerated treatment well Patient left: with call bell/phone within reach;in chair Nurse Communication: Mobility status PT Visit Diagnosis: Pain;Difficulty in walking, not elsewhere classified (R26.2);History of falling (Z91.81)     Time: [id number] PT Time Calculation (min) (ACUTE ONLY): 29 min  Charges:  $Gait Training: 23-37 mins                     [place] chat preferred Office [place] 05/22/2022, 2:22 PM

## 2022-05-23 DIAGNOSIS — Z8619 Personal history of other infectious and parasitic diseases: Secondary | ICD-10-CM | POA: Diagnosis not present

## 2022-05-23 DIAGNOSIS — I639 Cerebral infarction, unspecified: Secondary | ICD-10-CM | POA: Diagnosis not present

## 2022-05-23 DIAGNOSIS — K7460 Unspecified cirrhosis of liver: Secondary | ICD-10-CM | POA: Diagnosis not present

## 2022-05-23 DIAGNOSIS — K5900 Constipation, unspecified: Secondary | ICD-10-CM | POA: Diagnosis not present

## 2022-05-23 DIAGNOSIS — R7989 Other specified abnormal findings of blood chemistry: Secondary | ICD-10-CM | POA: Diagnosis not present

## 2022-05-23 DIAGNOSIS — E119 Type 2 diabetes mellitus without complications: Secondary | ICD-10-CM | POA: Diagnosis not present

## 2022-05-23 DIAGNOSIS — I10 Essential (primary) hypertension: Secondary | ICD-10-CM | POA: Diagnosis not present

## 2022-05-23 DIAGNOSIS — K219 Gastro-esophageal reflux disease without esophagitis: Secondary | ICD-10-CM | POA: Diagnosis not present

## 2022-05-23 DIAGNOSIS — I5022 Chronic systolic (congestive) heart failure: Secondary | ICD-10-CM | POA: Diagnosis not present

## 2022-05-24 DIAGNOSIS — K7460 Unspecified cirrhosis of liver: Secondary | ICD-10-CM | POA: Diagnosis not present

## 2022-05-24 DIAGNOSIS — E119 Type 2 diabetes mellitus without complications: Secondary | ICD-10-CM | POA: Diagnosis not present

## 2022-05-24 DIAGNOSIS — R7989 Other specified abnormal findings of blood chemistry: Secondary | ICD-10-CM | POA: Diagnosis not present

## 2022-05-24 DIAGNOSIS — I5022 Chronic systolic (congestive) heart failure: Secondary | ICD-10-CM | POA: Diagnosis not present

## 2022-05-24 DIAGNOSIS — I10 Essential (primary) hypertension: Secondary | ICD-10-CM | POA: Diagnosis not present

## 2022-05-25 ENCOUNTER — Telehealth: Payer: Self-pay

## 2022-05-25 NOTE — Telephone Encounter (Signed)
Patient attempted to be outreached by Junius Finner, PharmD Candidate on 05/25/2022 to discuss hypertension. Left voicemail for patient to return our call at their convenience at [phone].   [place] [facility]  PharmD Candidate 2024   Maryan Puls, PharmD PGY-1 [facility] Resident

## 2022-05-26 DIAGNOSIS — K5900 Constipation, unspecified: Secondary | ICD-10-CM | POA: Diagnosis not present

## 2022-05-26 DIAGNOSIS — K7460 Unspecified cirrhosis of liver: Secondary | ICD-10-CM | POA: Diagnosis not present

## 2022-05-26 DIAGNOSIS — F1021 Alcohol dependence, in remission: Secondary | ICD-10-CM | POA: Diagnosis not present

## 2022-05-26 DIAGNOSIS — I69359 Hemiplegia and hemiparesis following cerebral infarction affecting unspecified side: Secondary | ICD-10-CM | POA: Diagnosis not present

## 2022-05-29 ENCOUNTER — Other Ambulatory Visit: Payer: Self-pay

## 2022-05-29 NOTE — Telephone Encounter (Signed)
Patient attempted to be outreached by Levi Aland, PharmD Candidate on 05/29/22 to discuss hypertension. Left voicemail for patient to return our call at their convenience at [phone].   Levi Aland, PharmD Candidate, Class of 2024  Westwood E. Marsh, PharmD PGY-1 [facility] Pharmacy Resident

## 2022-05-31 ENCOUNTER — Other Ambulatory Visit: Payer: Self-pay | Admitting: Critical Care Medicine

## 2022-05-31 ENCOUNTER — Other Ambulatory Visit: Payer: Self-pay

## 2022-05-31 DIAGNOSIS — K7460 Unspecified cirrhosis of liver: Secondary | ICD-10-CM | POA: Diagnosis not present

## 2022-05-31 DIAGNOSIS — I5022 Chronic systolic (congestive) heart failure: Secondary | ICD-10-CM | POA: Diagnosis not present

## 2022-05-31 DIAGNOSIS — K219 Gastro-esophageal reflux disease without esophagitis: Secondary | ICD-10-CM | POA: Diagnosis not present

## 2022-05-31 DIAGNOSIS — I10 Essential (primary) hypertension: Secondary | ICD-10-CM | POA: Diagnosis not present

## 2022-05-31 MED ORDER — CLOPIDOGREL BISULFATE 75 MG PO TABS
75.0000 mg | ORAL_TABLET | Freq: Every day | ORAL | 0 refills | Status: DC
  Filled 2022-05-31 – 2022-06-01 (×2): qty 90, 90d supply, fill #0

## 2022-05-31 MED ORDER — DAPAGLIFLOZIN PROPANEDIOL 10 MG PO TABS
10.0000 mg | ORAL_TABLET | Freq: Every day | ORAL | 0 refills | Status: DC
  Filled 2022-05-31: qty 30, 30d supply, fill #0

## 2022-05-31 NOTE — Telephone Encounter (Signed)
Requested Prescriptions  Pending Prescriptions Disp Refills   dapagliflozin propanediol (FARXIGA) 10 MG TABS tablet 90 tablet 0    Sig: Take 1 tablet (10 mg total) by mouth once daily before breakfast.     Endocrinology:  Diabetes - SGLT2 Inhibitors Failed - 05/31/2022  8:18 AM      Failed - Cr in normal range and within 360 days    Creatinine, Ser  Date Value Ref Range Status  05/19/2022 1.54 (H) 0.61 - 1.24 mg/dL Final         Failed - eGFR in normal range and within 360 days    GFR calc Af Amer  Date Value Ref Range Status  03/04/2020 53 (L) >59 mL/min/1.73 Final    Comment:    **In accordance with recommendations from the NKF-ASN Task force,**   [facility] is in the process of updating its eGFR calculation to the   2021 CKD-EPI creatinine equation that estimates kidney function   without a race variable.    GFR, Estimated  Date Value Ref Range Status  05/19/2022 49 (L) >60 mL/min Final    Comment:    (NOTE) Calculated using the CKD-EPI Creatinine Equation (2021)    eGFR  Date Value Ref Range Status  01/04/2022 55 (L) >59 mL/min/1.73 Final         Passed - HBA1C is between 0 and 7.9 and within 180 days    HbA1c, POC (prediabetic range)  Date Value Ref Range Status  01/17/2019 6.7 (A) 5.7 - 6.4 % Final   HbA1c, POC (controlled diabetic range)  Date Value Ref Range Status  09/13/2021 7.2 (A) 0.0 - 7.0 % Final   Hgb A1c MFr Bld  Date Value Ref Range Status  05/19/2022 6.2 (H) 4.8 - 5.6 % Final    Comment:    (NOTE) Pre diabetes:          5.7%-6.4%  Diabetes:              >6.4%  Glycemic control for   <7.0% adults with diabetes          Passed - Valid encounter within last 6 months    Recent Outpatient Visits           3 months ago Essential hypertension   [place], [place]   4 months ago Primary osteoarthritis of right knee   [place], Vernia Buff, NP   7  months ago Essential hypertension   [place], Patrick E, MD   8 months ago Essential hypertension   [place], Patrick E, MD   1 year ago Medication management   [place], Jarome Matin, RPH-CPP       Future Appointments             In 1 week [place], [place]             clopidogrel (PLAVIX) 75 MG tablet 90 tablet 0    Sig: Take 1 tablet (75 mg total) by mouth daily.     Hematology: Antiplatelets - clopidogrel Failed - 05/31/2022  8:18 AM      Failed - PLT in normal range and within 180 days    Platelets  Date Value Ref Range Status  05/18/2022 135 (L) 150 - 400  K/uL Final  02/23/2021 143 (L) 150 - 450 x10E3/uL Final         Failed - Cr in normal range and within 360 days    Creatinine, Ser  Date Value Ref Range Status  05/19/2022 1.54 (H) 0.61 - 1.24 mg/dL Final         Passed - HCT in normal range and within 180 days    HCT  Date Value Ref Range Status  05/18/2022 40.3 39.0 - 52.0 % Final   Hematocrit  Date Value Ref Range Status  02/23/2021 39.9 37.5 - 51.0 % Final         Passed - HGB in normal range and within 180 days    Hemoglobin  Date Value Ref Range Status  05/18/2022 13.3 13.0 - 17.0 g/dL Final  02/23/2021 13.2 13.0 - 17.7 g/dL Final         Passed - Valid encounter within last 6 months    Recent Outpatient Visits           3 months ago Essential hypertension   [place], [place]   4 months ago Primary osteoarthritis of right knee   [place], NP   7 months ago Essential hypertension   [place], MD   8 months ago Essential hypertension   [place] [name], MD   1 year ago Medication management   [place], RPH-CPP       Future Appointments             In 1 week Thereasa Solo Dionne Bucy, PA-C [place]

## 2022-05-31 NOTE — Telephone Encounter (Signed)
Requested medication (s) are due for refill today: yes  Requested medication (s) are on the active medication list: yes  Last refill:  multiple  Future visit scheduled: yes  Notes to clinic:  Unable to refill per protocol, last refill by another provider, can not delegate     Requested Prescriptions  Pending Prescriptions Disp Refills   senna-docusate (SENOKOT-S) 8.6-50 MG tablet      Sig: Take 2 tablets by mouth 2 (two) times daily.     Over the Counter:  OTC Passed - 05/31/2022 11:17 AM      Passed - Valid encounter within last 12 months    Recent Outpatient Visits           3 months ago Essential hypertension   [place], [place]   4 months ago Primary osteoarthritis of right knee   [place], NP   7 months ago Essential hypertension   [place], MD   8 months ago Essential hypertension   [place] [name], MD   1 year ago Medication management   [place], RPH-CPP       Future Appointments             In 1 week Argentina Donovan, [place]             polyethylene glycol (MIRALAX / GLYCOLAX) 17 g packet 14 each 0    Sig: Take 17 g by mouth daily.     Gastroenterology:  Laxatives Passed - 05/31/2022 11:17 AM      Passed - Valid encounter within last 12 months    Recent Outpatient Visits           3 months ago Essential hypertension   [place], [place]   4 months ago Primary osteoarthritis of right knee   [place], Vernia Buff, NP   7 months ago Essential hypertension   [place], MD   8 months ago  Essential hypertension   [place], Patrick E, MD   1 year ago Medication management   [place], RPH-CPP       Future Appointments             In 1 week Argentina Donovan, [place]             spironolactone (ALDACTONE) 25 MG tablet 90 tablet 2    Sig: Take 1 tablet (25 mg total) by mouth once daily.     Cardiovascular: Diuretics - Aldosterone Antagonist Failed - 05/31/2022 11:17 AM      Failed - Cr in normal range and within 180 days    Creatinine, Ser  Date Value Ref Range Status  05/19/2022 1.54 (H) 0.61 - 1.24 mg/dL Final         Failed - Na in normal range and within 180 days    Sodium  Date Value Ref Range Status  05/19/2022 133 (L) 135 - 145 mmol/L Final  01/04/2022 140 134 - 144 mmol/L Final  Passed - K in normal range and within 180 days    Potassium  Date Value Ref Range Status  05/19/2022 4.5 3.5 - 5.1 mmol/L Final         Passed - eGFR is 30 or above and within 180 days    GFR calc Af Amer  Date Value Ref Range Status  03/04/2020 53 (L) >59 mL/min/1.73 Final    Comment:    **In accordance with recommendations from the NKF-ASN Task force,**   [facility] is in the process of updating its eGFR calculation to the   2021 CKD-EPI creatinine equation that estimates kidney function   without a race variable.    GFR, Estimated  Date Value Ref Range Status  05/19/2022 49 (L) >60 mL/min Final    Comment:    (NOTE) Calculated using the CKD-EPI Creatinine Equation (2021)    eGFR  Date Value Ref Range Status  01/04/2022 55 (L) >59 mL/min/1.73 Final         Passed - Last BP in normal range    BP Readings from Last 1 Encounters:  05/22/22 135/71         Passed - Valid encounter within last 6 months    Recent Outpatient Visits           3 months ago Essential hypertension   [place], [place], [place]   4 months ago Primary osteoarthritis of right knee   [place], Vernia Buff, NP   7 months ago Essential hypertension   [place], MD   8 months ago Essential hypertension   [place] [name], MD   1 year ago Medication management   [place], RPH-CPP       Future Appointments             In 1 week Argentina Donovan, [place]             thiamine (VITAMIN B-1) 100 MG tablet      Sig: Take 1 tablet (100 mg total) by mouth daily.     Off-Protocol Failed - 05/31/2022 11:17 AM      Failed - Medication not assigned to a protocol, review manually.      Passed - Valid encounter within last 12 months    Recent Outpatient Visits           3 months ago Essential hypertension   [place], [place]   4 months ago Primary osteoarthritis of right knee   [place], NP   7 months ago Essential hypertension   [place], MD   8 months ago Essential hypertension   [place] [name], MD   1 year ago Medication management   [place], RPH-CPP       Future Appointments             In 1 week Thereasa Solo, Dionne Bucy, PA-C [place]             traMADol (ULTRAM) 50 MG tablet 15  tablet 0    Sig: Take 1 tablet (50 mg total) by mouth every 12 (twelve) hours as needed for severe pain.     Not Delegated - Analgesics:  Opioid Agonists Failed - 05/31/2022 11:17 AM      Failed - This refill cannot be  delegated      Passed - Urine Drug Screen completed in last 360 days      Passed - Valid encounter within last 3 months    Recent Outpatient Visits           3 months ago Essential hypertension   [place], [place]   4 months ago Primary osteoarthritis of right knee   [place], Vernia Buff, NP   7 months ago Essential hypertension   [place], MD   8 months ago Essential hypertension   [place], Patrick E, MD   1 year ago Medication management   [place], RPH-CPP       Future Appointments             In 1 week Argentina Donovan, [place]             atorvastatin (LIPITOR) 20 MG tablet 30 tablet 1    Sig: Take 1 tablet (20 mg total) by mouth daily.     Cardiovascular:  Antilipid - Statins Failed - 05/31/2022 11:17 AM      Failed - Lipid Panel in normal range within the last 12 months    Cholesterol, Total  Date Value Ref Range Status  02/23/2021 108 100 - 199 mg/dL Final   Cholesterol  Date Value Ref Range Status  05/18/2022 109 0 - 200 mg/dL Final   LDL Chol Calc (NIH)  Date Value Ref Range Status  02/23/2021 52 0 - 99 mg/dL Final   LDL Cholesterol  Date Value Ref Range Status  05/18/2022 59 0 - 99 mg/dL Final    Comment:           Total Cholesterol/HDL:CHD Risk Coronary Heart Disease Risk Table                     Men   Women  1/2 Average Risk   3.4   3.3  Average Risk       5.0   4.4  2 X Average Risk   9.6   7.1  3 X Average Risk  23.4   11.0        Use the calculated Patient Ratio above and the CHD Risk Table to determine the patient's CHD Risk.        ATP III CLASSIFICATION (LDL):  <100     mg/dL   Optimal  100-129  mg/dL   Near or Above                     Optimal  130-159  mg/dL   Borderline  160-189  mg/dL   High  >190     mg/dL   Very High Performed at [facility] [address] [place], [place] [address]    HDL  Date Value Ref Range Status  05/18/2022 29 (L) >40 mg/dL Final  02/23/2021 36 (L) >39 mg/dL Final   Triglycerides  Date Value Ref Range Status  05/18/2022 106 <150 mg/dL Final         Passed - Patient is not pregnant      Passed - Valid encounter within last 12 months    Recent Outpatient Visits           3 months ago Essential hypertension   [place], [place]   4 months ago Primary osteoarthritis of right knee   [place], Vernia Buff, NP   7 months ago Essential hypertension   [place], MD   8 months ago Essential hypertension   [place], MD   1 year ago Medication management   [place], RPH-CPP       Future Appointments             In 1 week [place] Donovan, [place]             carvedilol (COREG) 25 MG tablet 120 tablet 3    Sig: TAKE 1 TABLET (25 MG TOTAL) BY MOUTH 2 (TWO) TIMES DAILY WITH A MEAL.     Cardiovascular: Beta Blockers 3 Failed - 05/31/2022 11:17 AM      Failed - Cr in normal range and within 360 days    Creatinine, Ser  Date Value Ref Range Status  05/19/2022 1.54 (H) 0.61 - 1.24 mg/dL Final         Passed - AST in normal range and within 360 days    AST  Date Value Ref Range Status  05/16/2022 25 15 - 41 U/L Final         Passed - ALT in normal range and within 360 days    ALT  Date Value Ref Range Status  05/16/2022 22 0 - 44 U/L Final         Passed - Last BP in normal range    BP Readings from Last 1 Encounters:  05/22/22 135/71         Passed  - Last Heart Rate in normal range    Pulse Readings from Last 1 Encounters:  05/22/22 (!) 58         Passed - Valid encounter within last 6 months    Recent Outpatient Visits           3 months ago Essential hypertension   [place], [place]   4 months ago Primary osteoarthritis of right knee   [place], [place], NP   7 months ago Essential hypertension   [place], MD   8 months ago Essential hypertension   [place] [name], MD   1 year ago Medication management   [place], RPH-CPP       Future Appointments             In 1 week Argentina Donovan, [place] [facility]             famotidine (PEPCID) 20 MG tablet 90 tablet 0    Sig: Take 1  tablet (20 mg total) by mouth daily.     Gastroenterology:  H2 Antagonists Passed - 05/31/2022 11:17 AM      Passed - Valid encounter within last 12 months    Recent Outpatient Visits           3 months ago Essential hypertension   [place], [place]   4 months ago Primary osteoarthritis of right knee   [place], Vernia Buff, NP   7 months ago Essential hypertension   [place], MD   8 months ago Essential hypertension   [place] [name], MD   1 year ago Medication management   [place], RPH-CPP       Future Appointments             In 1 week Argentina Donovan, [place]             furosemide (LASIX) 40 MG tablet 90 tablet 2    Sig:  TAKE 1 TABLET (40 MG TOTAL) BY MOUTH ONCE DAILY.     Cardiovascular:  Diuretics - Loop Failed - 05/31/2022 11:17 AM      Failed - Ca in normal range and within 180 days    Calcium  Date Value Ref Range Status  05/19/2022 10.8 (H) 8.9 - 10.3 mg/dL Final   Calcium, Total (PTH)  Date Value Ref Range Status  02/12/2018 11.0 (H) 8.6 - 10.2 mg/dL Final   Calcium, Ion  Date Value Ref Range Status  05/11/2022 1.51 (HH) 1.15 - 1.40 mmol/L Final         Failed - Na in normal range and within 180 days    Sodium  Date Value Ref Range Status  05/19/2022 133 (L) 135 - 145 mmol/L Final  01/04/2022 140 134 - 144 mmol/L Final         Failed - Cr in normal range and within 180 days    Creatinine, Ser  Date Value Ref Range Status  05/19/2022 1.54 (H) 0.61 - 1.24 mg/dL Final         Passed - K in normal range and within 180 days    Potassium  Date Value Ref Range Status  05/19/2022 4.5 3.5 - 5.1 mmol/L Final         Passed - Cl in normal range and within 180 days    Chloride  Date Value Ref Range Status  05/19/2022 102 98 - 111 mmol/L Final         Passed - Mg Level in normal range and within 180 days    Magnesium  Date Value Ref Range Status  05/16/2022 1.9 1.7 - 2.4 mg/dL Final    Comment:    Performed at [facility], [place] [address] [place], [place] [address]         Passed - Last BP in normal range    BP Readings from Last 1 Encounters:  05/22/22 135/71         Passed - Valid encounter within last 6 months    Recent Outpatient Visits           3 months ago Essential hypertension   [place], [place]   4 months  ago Primary osteoarthritis of right knee   [place], NP   7 months ago Essential hypertension   [place], MD   8 months ago Essential hypertension   [place] [name], MD   1 year ago Medication management   [place], RPH-CPP       Future Appointments             In 1 week Thereasa Solo Dionne Bucy, PA-C [place]

## 2022-05-31 NOTE — Telephone Encounter (Signed)
Medication Refill - Medication: polyethylene glycol (MIRALAX / GLYCOLAX) 17 g packet  senna-docusate (SENOKOT-S) 8.6-50 MG tablet  spironolactone (ALDACTONE) 25 MG tablet  thiamine (VITAMIN B-1) 100 MG tablet traMADol (ULTRAM) 50 MG tablet  atorvastatin (LIPITOR) 20 MG tablet  carvedilol (COREG) 25 MG tablet  famotidine (PEPCID) 20 MG tablet  furosemide (LASIX) 40 MG tablet   Has the patient contacted their pharmacy? Yes.   Ms. Andres Shad states that the pharmacy told her that they would send in a request for three medications but she would need to call PCP for the rest of the medications.  Pt will be discharged Friday from the Nursing home to come home and will  need the medications. Ms. Andres Shad states that pt was supposed to be in the Nursing home for 20 days so she threw the medication out and is needing it refilled since he will be home on Friday.   Preferred Pharmacy (with phone number or street name):  [facility] Phone: [phone]  Fax: [phone]     Has the patient been seen for an appointment in the last year OR does the patient have an upcoming appointment? Yes.    Agent: Please be advised that RX refills may take up to 3 business days. We ask that you follow-up with your pharmacy.

## 2022-06-01 ENCOUNTER — Other Ambulatory Visit: Payer: Self-pay | Admitting: Pharmacist

## 2022-06-01 ENCOUNTER — Other Ambulatory Visit: Payer: Self-pay

## 2022-06-01 MED ORDER — SPIRONOLACTONE 25 MG PO TABS
25.0000 mg | ORAL_TABLET | Freq: Every day | ORAL | 0 refills | Status: DC
  Filled 2022-06-01: qty 90, 90d supply, fill #0

## 2022-06-02 ENCOUNTER — Other Ambulatory Visit: Payer: Self-pay

## 2022-06-02 MED ORDER — SENNOSIDES-DOCUSATE SODIUM 8.6-50 MG PO TABS
2.0000 | ORAL_TABLET | Freq: Two times a day (BID) | ORAL | 0 refills | Status: DC
  Filled 2022-06-02: qty 60, 15d supply, fill #0

## 2022-06-02 MED ORDER — ATORVASTATIN CALCIUM 20 MG PO TABS
20.0000 mg | ORAL_TABLET | Freq: Every day | ORAL | 0 refills | Status: DC
  Filled 2022-06-02: qty 30, 30d supply, fill #0

## 2022-06-02 MED ORDER — POLYETHYLENE GLYCOL 3350 17 G PO PACK
17.0000 g | PACK | Freq: Every day | ORAL | 0 refills | Status: DC
  Filled 2022-06-02: qty 14, 14d supply, fill #0

## 2022-06-02 MED ORDER — VITAMIN B-1 100 MG PO TABS
100.0000 mg | ORAL_TABLET | Freq: Every day | ORAL | 0 refills | Status: DC
  Filled 2022-06-02: qty 30, 30d supply, fill #0

## 2022-06-02 MED ORDER — FUROSEMIDE 40 MG PO TABS
40.0000 mg | ORAL_TABLET | Freq: Every day | ORAL | 0 refills | Status: DC
  Filled 2022-06-02: qty 30, fill #0

## 2022-06-02 MED ORDER — CARVEDILOL 25 MG PO TABS
25.0000 mg | ORAL_TABLET | Freq: Two times a day (BID) | ORAL | 0 refills | Status: DC
  Filled 2022-06-02: qty 60, fill #0

## 2022-06-02 MED ORDER — FAMOTIDINE 20 MG PO TABS
20.0000 mg | ORAL_TABLET | Freq: Every day | ORAL | 0 refills | Status: DC
  Filled 2022-06-02: qty 30, 30d supply, fill #0

## 2022-06-04 DIAGNOSIS — I110 Hypertensive heart disease with heart failure: Secondary | ICD-10-CM | POA: Diagnosis not present

## 2022-06-04 DIAGNOSIS — M6281 Muscle weakness (generalized): Secondary | ICD-10-CM | POA: Diagnosis not present

## 2022-06-04 DIAGNOSIS — I69398 Other sequelae of cerebral infarction: Secondary | ICD-10-CM | POA: Diagnosis not present

## 2022-06-04 DIAGNOSIS — K219 Gastro-esophageal reflux disease without esophagitis: Secondary | ICD-10-CM | POA: Diagnosis not present

## 2022-06-04 DIAGNOSIS — I5022 Chronic systolic (congestive) heart failure: Secondary | ICD-10-CM | POA: Diagnosis not present

## 2022-06-04 DIAGNOSIS — E785 Hyperlipidemia, unspecified: Secondary | ICD-10-CM | POA: Diagnosis not present

## 2022-06-04 DIAGNOSIS — E1122 Type 2 diabetes mellitus with diabetic chronic kidney disease: Secondary | ICD-10-CM | POA: Diagnosis not present

## 2022-06-04 DIAGNOSIS — K7460 Unspecified cirrhosis of liver: Secondary | ICD-10-CM | POA: Diagnosis not present

## 2022-06-04 DIAGNOSIS — N183 Chronic kidney disease, stage 3 unspecified: Secondary | ICD-10-CM | POA: Diagnosis not present

## 2022-06-05 ENCOUNTER — Telehealth: Payer: Self-pay | Admitting: Emergency Medicine

## 2022-06-05 ENCOUNTER — Telehealth: Payer: Self-pay | Admitting: Critical Care Medicine

## 2022-06-05 ENCOUNTER — Other Ambulatory Visit: Payer: Self-pay

## 2022-06-05 DIAGNOSIS — B351 Tinea unguium: Secondary | ICD-10-CM

## 2022-06-05 NOTE — Telephone Encounter (Signed)
Called patient and left voicemail.

## 2022-06-05 NOTE — Addendum Note (Signed)
Addended by: Asencion Noble E on: 06/05/2022 02:55 PM   Modules accepted: Orders

## 2022-06-05 NOTE — Telephone Encounter (Signed)
Copied from [place] [phone]. Topic: Referral - Request for Referral >> Jun 05, 2022  8:47 AM Erskine Squibb wrote: Has patient seen PCP for this complaint? No. Referral for which specialty: Podiatry Preferred provider/office: Podiatry in [place] Reason for referral: Swelling in feet and legs  Patient is on a walker and he wobbles. He has a history of falling and he really needs a referral to see a podiatrist as soon as possible as he also has very long embedded nails.

## 2022-06-05 NOTE — Telephone Encounter (Signed)
Hermenia Fiscal RN calling from [facility] is calling to received order for plan of care for yesterday for skilled nursing. Frequency   2 W 1 1 W 3 2 Month 1  2 PRN  [phone] Verbal ok on VM

## 2022-06-05 NOTE — Telephone Encounter (Signed)
Ms Shorter called in stating the patients bp machine and glucose monitor do not work. He needs an order for 2 new machines that work so he can keep up with his blood pressure and blood sugar . They want to put this order in now so that when they come next week for his appt they can take [facility] those up. Please assist patient further.

## 2022-06-05 NOTE — Telephone Encounter (Signed)
Let pt know podiatry ref sent

## 2022-06-06 ENCOUNTER — Other Ambulatory Visit: Payer: Self-pay

## 2022-06-06 ENCOUNTER — Other Ambulatory Visit: Payer: Self-pay | Admitting: Pharmacist

## 2022-06-06 MED ORDER — ACCU-CHEK GUIDE W/DEVICE KIT
PACK | 0 refills | Status: AC
  Filled 2022-06-06 – 2022-06-15 (×2): qty 1, 30d supply, fill #0

## 2022-06-06 MED ORDER — ACCU-CHEK GUIDE VI STRP
ORAL_STRIP | 6 refills | Status: AC
  Filled 2022-06-06 – 2022-06-15 (×2): qty 100, 50d supply, fill #0

## 2022-06-06 MED ORDER — BLOOD PRESSURE KIT DEVI
0 refills | Status: AC
  Filled 2022-06-06: qty 1, fill #0

## 2022-06-06 MED ORDER — ONETOUCH VERIO W/DEVICE KIT
PACK | 0 refills | Status: DC
  Filled 2022-06-06: qty 1, fill #0

## 2022-06-06 MED ORDER — ONETOUCH VERIO VI STRP
ORAL_STRIP | 12 refills | Status: DC
  Filled 2022-06-06: qty 100, 25d supply, fill #0

## 2022-06-06 MED ORDER — ACCU-CHEK SOFTCLIX LANCETS MISC
6 refills | Status: AC
  Filled 2022-06-06 – 2022-06-15 (×2): qty 100, 50d supply, fill #0

## 2022-06-06 MED ORDER — ONETOUCH DELICA PLUS LANCET33G MISC
2 refills | Status: DC
  Filled 2022-06-06: qty 100, 25d supply, fill #0

## 2022-06-06 NOTE — Telephone Encounter (Signed)
Let [facility] know I am in [place], do I send a new epic order or will they send me a paper order to sign jane???

## 2022-06-06 NOTE — Telephone Encounter (Signed)
Rx sent 

## 2022-06-07 ENCOUNTER — Telehealth: Payer: Self-pay

## 2022-06-07 DIAGNOSIS — I639 Cerebral infarction, unspecified: Secondary | ICD-10-CM

## 2022-06-07 NOTE — Telephone Encounter (Signed)
Called patient and he is aware  

## 2022-06-07 NOTE — Telephone Encounter (Signed)
We received a message from patient's caregiver, Austin Mercer, that he will need transportation to his upcoming appointment with Dr Joya Gaskins.  They do not want a virtual visit.   Patient is living on the second floor and unable to negotiate the 15 stairs to get to the first floor.  I will refer to [facility] for assistance with coordinating the appropriate transportation.

## 2022-06-08 ENCOUNTER — Telehealth: Payer: Self-pay | Admitting: *Deleted

## 2022-06-08 NOTE — Progress Notes (Signed)
  Care Coordination  Outreach Note  06/08/2022 Name: Austin Mercer. MRN: [id number] DOB: November 08, 1950   Care Coordination Outreach Attempts: An unsuccessful telephone outreach was attempted today to offer the patient information about available care coordination services as a benefit of their health plan.   Follow Up Plan:  Additional outreach attempts will be made to offer the patient care coordination information and services.   Encounter Outcome:  No Answer  [place]  Direct Dial: [phone]

## 2022-06-09 DIAGNOSIS — M6281 Muscle weakness (generalized): Secondary | ICD-10-CM | POA: Diagnosis not present

## 2022-06-09 DIAGNOSIS — I69398 Other sequelae of cerebral infarction: Secondary | ICD-10-CM | POA: Diagnosis not present

## 2022-06-09 DIAGNOSIS — I5022 Chronic systolic (congestive) heart failure: Secondary | ICD-10-CM | POA: Diagnosis not present

## 2022-06-09 DIAGNOSIS — N183 Chronic kidney disease, stage 3 unspecified: Secondary | ICD-10-CM | POA: Diagnosis not present

## 2022-06-09 DIAGNOSIS — K7460 Unspecified cirrhosis of liver: Secondary | ICD-10-CM | POA: Diagnosis not present

## 2022-06-09 DIAGNOSIS — E785 Hyperlipidemia, unspecified: Secondary | ICD-10-CM | POA: Diagnosis not present

## 2022-06-09 DIAGNOSIS — I110 Hypertensive heart disease with heart failure: Secondary | ICD-10-CM | POA: Diagnosis not present

## 2022-06-09 DIAGNOSIS — E1122 Type 2 diabetes mellitus with diabetic chronic kidney disease: Secondary | ICD-10-CM | POA: Diagnosis not present

## 2022-06-09 DIAGNOSIS — K219 Gastro-esophageal reflux disease without esophagitis: Secondary | ICD-10-CM | POA: Diagnosis not present

## 2022-06-09 NOTE — Progress Notes (Unsigned)
  Care Coordination  Outreach Note  06/09/2022 Name: Austin Mercer. MRN: [id number] DOB: 05/05/1950   Care Coordination Outreach Attempts: An unsuccessful telephone outreach was attempted today to offer the patient information about available care coordination services as a benefit of their health plan.   Follow Up Plan:  Additional outreach attempts will be made to offer the patient care coordination information and services.   Encounter Outcome:  No Answer  [place]  Direct Dial: [phone]

## 2022-06-12 ENCOUNTER — Telehealth: Payer: Self-pay | Admitting: Emergency Medicine

## 2022-06-12 ENCOUNTER — Inpatient Hospital Stay: Payer: No Typology Code available for payment source | Admitting: Physician Assistant

## 2022-06-12 DIAGNOSIS — K7460 Unspecified cirrhosis of liver: Secondary | ICD-10-CM | POA: Diagnosis not present

## 2022-06-12 DIAGNOSIS — I5022 Chronic systolic (congestive) heart failure: Secondary | ICD-10-CM | POA: Diagnosis not present

## 2022-06-12 DIAGNOSIS — I110 Hypertensive heart disease with heart failure: Secondary | ICD-10-CM | POA: Diagnosis not present

## 2022-06-12 DIAGNOSIS — N183 Chronic kidney disease, stage 3 unspecified: Secondary | ICD-10-CM | POA: Diagnosis not present

## 2022-06-12 DIAGNOSIS — E1122 Type 2 diabetes mellitus with diabetic chronic kidney disease: Secondary | ICD-10-CM | POA: Diagnosis not present

## 2022-06-12 DIAGNOSIS — K219 Gastro-esophageal reflux disease without esophagitis: Secondary | ICD-10-CM | POA: Diagnosis not present

## 2022-06-12 DIAGNOSIS — M6281 Muscle weakness (generalized): Secondary | ICD-10-CM | POA: Diagnosis not present

## 2022-06-12 DIAGNOSIS — I69398 Other sequelae of cerebral infarction: Secondary | ICD-10-CM | POA: Diagnosis not present

## 2022-06-12 DIAGNOSIS — E785 Hyperlipidemia, unspecified: Secondary | ICD-10-CM | POA: Diagnosis not present

## 2022-06-12 NOTE — Progress Notes (Unsigned)
  Care Coordination  Outreach Note  06/12/2022 Name: Sherrill Mckamie. MRN: [id number] DOB: [birth year]   Care Coordination Outreach Attempts: An unsuccessful telephone outreach was attempted today to offer the patient information about available care coordination services as a benefit of their health plan.   Follow Up Plan:  Additional outreach attempts will be made to offer the patient care coordination information and services.   Encounter Outcome:  No Answer  [place]  Direct Dial: [phone]

## 2022-06-12 NOTE — Telephone Encounter (Signed)
Copied from [place] [phone]. Topic: Transportation - Transportation >> Jun 12, 2022 10:16 AM Sabas Sous wrote: Reason for CRM: Pt's friend Evla Shorter called to discuss transportation services for the patient. She says she spoke to catherine several times last week, could be talking about [place]. [phone]

## 2022-06-13 ENCOUNTER — Other Ambulatory Visit: Payer: Self-pay

## 2022-06-13 ENCOUNTER — Ambulatory Visit: Payer: Medicare PPO | Admitting: Podiatry

## 2022-06-13 ENCOUNTER — Encounter: Payer: Self-pay | Admitting: Podiatry

## 2022-06-13 ENCOUNTER — Telehealth: Payer: Self-pay | Admitting: Critical Care Medicine

## 2022-06-13 DIAGNOSIS — M79675 Pain in left toe(s): Secondary | ICD-10-CM

## 2022-06-13 DIAGNOSIS — E119 Type 2 diabetes mellitus without complications: Secondary | ICD-10-CM | POA: Diagnosis not present

## 2022-06-13 DIAGNOSIS — M79674 Pain in right toe(s): Secondary | ICD-10-CM

## 2022-06-13 DIAGNOSIS — B351 Tinea unguium: Secondary | ICD-10-CM

## 2022-06-13 NOTE — Progress Notes (Signed)
  Subjective:  Patient ID: Austin Mercer., male    DOB: [birth year],   MRN: [id number]  Chief Complaint  Patient presents with   Nail Problem     Routine foot care     72 y.o. male presents for concern of thickened elongated and painful nails that are difficult to trim. Requesting to have them trimmed today. Relates burning and tingling in their feet. Patient is diabetic and last A1c was  Lab Results  Component Value Date   HGBA1C 6.2 (H) 05/19/2022   .   PCP:  [name], MD    . Denies any other pedal complaints. Denies n/v/f/c.   Past Medical History:  Diagnosis Date   Diabetes ([place])    Hypertension    Hypertensive urgency 03/04/2020   Stroke [facility])     Objective:  Physical Exam: Vascular: DP/PT pulses 2/4 bilateral. CFT <3 seconds. Absent hair growth on digits. Edema noted to bilateral lower extremities. Xerosis noted bilaterally.  Skin. No lacerations or abrasions bilateral feet. Nails 1-5 bilateral  are thickened discolored and elongated with subungual debris.  Musculoskeletal: MMT 5/5 bilateral lower extremities in DF, PF, Inversion and Eversion. Deceased ROM in DF of ankle joint.  Neurological: Sensation intact to light touch. Protective sensation diminished bilateral.    Assessment:   1. Pain due to onychomycosis of toenails of both feet   2. Type 2 diabetes mellitus without complication, unspecified whether long term insulin use ([place])      Plan:  Patient was evaluated and treated and all questions answered. -Discussed and educated patient on diabetic foot care, especially with  regards to the vascular, neurological and musculoskeletal systems.  -Stressed the importance of good glycemic control and the detriment of not  controlling glucose levels in relation to the foot. -Discussed supportive shoes at all times and checking feet regularly.  -Mechanically debrided all nails 1-5 bilateral using sterile nail nipper and filed with dremel without incident   -Answered all patient questions -Patient to return  in 3 months for at risk foot care -Patient advised to call the office if any problems or questions arise in the meantime.   Lorenda Peck, DPM

## 2022-06-13 NOTE — Telephone Encounter (Signed)
Home Health Verbal Orders - Caller/Agency: Erin with [facility] Number: [phone] ok to leave message  Requesting OT/PT/Skilled Nursing/Social Work/Speech Therapy: PT  Frequency: 1 week 7

## 2022-06-14 ENCOUNTER — Ambulatory Visit: Payer: Self-pay

## 2022-06-14 ENCOUNTER — Telehealth: Payer: Self-pay | Admitting: Critical Care Medicine

## 2022-06-14 ENCOUNTER — Telehealth: Payer: Self-pay | Admitting: *Deleted

## 2022-06-14 DIAGNOSIS — I110 Hypertensive heart disease with heart failure: Secondary | ICD-10-CM | POA: Diagnosis not present

## 2022-06-14 DIAGNOSIS — E785 Hyperlipidemia, unspecified: Secondary | ICD-10-CM | POA: Diagnosis not present

## 2022-06-14 DIAGNOSIS — N183 Chronic kidney disease, stage 3 unspecified: Secondary | ICD-10-CM | POA: Diagnosis not present

## 2022-06-14 DIAGNOSIS — E1122 Type 2 diabetes mellitus with diabetic chronic kidney disease: Secondary | ICD-10-CM | POA: Diagnosis not present

## 2022-06-14 DIAGNOSIS — K219 Gastro-esophageal reflux disease without esophagitis: Secondary | ICD-10-CM | POA: Diagnosis not present

## 2022-06-14 DIAGNOSIS — K7460 Unspecified cirrhosis of liver: Secondary | ICD-10-CM | POA: Diagnosis not present

## 2022-06-14 DIAGNOSIS — I69398 Other sequelae of cerebral infarction: Secondary | ICD-10-CM | POA: Diagnosis not present

## 2022-06-14 DIAGNOSIS — M6281 Muscle weakness (generalized): Secondary | ICD-10-CM | POA: Diagnosis not present

## 2022-06-14 DIAGNOSIS — I5022 Chronic systolic (congestive) heart failure: Secondary | ICD-10-CM | POA: Diagnosis not present

## 2022-06-14 NOTE — Patient Outreach (Signed)
  Care Coordination   Initial Visit Note   06/14/2022 Name: Omran Keelin. MRN: [id number] DOB: November 20, 1950  Davyn Pius Byrom. is a 72 y.o. year old male who sees [name], MD for primary care. I  spoke with patients friend and caregiver [name] by phone who indicated patient is in need of assistance with transportation. Patient is currently using a taxi which is very costly.  What matters to the patients health and wellness today?  To be approved for SCAT    Goals Addressed             This Visit's Progress    Care Coordination Activities       Care Coordination Interventions: Determined patient is interested in assistance with completing a SCAT application Completed and submitted Part A to [place] with Evelena Asa, RN with patients primary care provider requesting completion of Part B         SDOH assessments and interventions completed:  No     Care Coordination Interventions:  Yes, provided  Interventions Today    Flowsheet Row Most Recent Value  Chronic Disease   Chronic disease during today's visit Hypertension (HTN)  General Interventions   General Interventions Discussed/Reviewed General Interventions Discussed, [employer], Communication with  Communication with RN        Follow up plan:  SW will continue to follow    Encounter Outcome:  Pt. Visit Completed   Daneen Schick, Arita Miss, CDP Social Worker, Certified Dementia Practitioner [facility] Care Management  Care Coordination [phone]

## 2022-06-14 NOTE — Patient Outreach (Signed)
  Care Coordination   Follow Up Visit Note   06/14/2022 Name: Austin Mercer. MRN: [id number] DOB: 1951-02-24  Austin Mercer. is a 72 y.o. year old male who sees Austin Stain, MD for primary care. I spoke with  Austin Mercer. by phone today.  What matters to the patients health and wellness today?  Patient is in need of transportation to appointment on 3/14    Goals Addressed             This Visit's Progress    Care Coordination Activities   On track    Care Coordination Interventions: Collaboration with Evelena Asa with patients primary care providers office who indicates the patients friend and caregiver Austin Mercer has contacted the clinic regarding transportation to an appointment on 3/14 Contacted the patient who gave verbal consent for SW to speak with Austin Mercer Determined the patient normally uses a taxi arranged for by his providers office but this has not been confirmed Mrs. Mercer indicates patient is able to get down the steps of his apartment complex with assistance that she and his sister are able to provide. Patient is able to get into a vehicle and will be traveling with a walker Collaboration with [place] who was able to secure transportation to patients appointment on 3/14         SDOH assessments and interventions completed:  No     Care Coordination Interventions:  Yes, provided   Interventions Today    Flowsheet Row Most Recent Value  Chronic Disease   Chronic disease during today's visit Hypertension (HTN)  General Interventions   General Interventions Discussed/Reviewed [employer], Communication with  Communication with --  [Referral to Care Guide team to request assistance with transportation]        Follow up plan:  SW will continue to follow    Encounter Outcome:  Pt. Visit Completed   Austin Mercer, Austin Mercer, CDP Social Worker, Certified Dementia Practitioner [facility] Care Management  Care  Coordination [phone]

## 2022-06-14 NOTE — Telephone Encounter (Signed)
Copied from [place] [id number]. Topic: Transportation - Transportation >> Jun 14, 2022  2:09 PM [place] B wrote: Reason for CRM: Patients friend, Ms. Shorter called in to confirm if ride is picking up patient at 9:40. Please call back

## 2022-06-14 NOTE — Patient Instructions (Signed)
Visit Information  Thank you for taking time to visit with me today. Please don't hesitate to contact me if I can be of assistance to you.   Following are the goals we discussed today:   Goals Addressed             This Visit's Progress    Care Coordination Activities   On track    Care Coordination Interventions: Collaboration with Evelena Asa with patients primary care providers office who indicates the patients friend and caregiver [name] has contacted the clinic regarding transportation to an appointment on 3/14 Contacted the patient who gave verbal consent for SW to speak with [name] Determined the patient normally uses a taxi arranged for by his providers office but this has not been confirmed Mrs. Shorter indicates patient is able to get down the steps of his apartment complex with assistance that she and his sister are able to provide. Patient is able to get into a vehicle and will be traveling with a walker Collaboration with [place] who was able to secure transportation to patients appointment on 3/14          If you are experiencing a Mental Health or [place] or need someone to talk to, please call 911  The patient verbalized understanding of instructions, educational materials, and care plan provided today and DECLINED offer to receive copy of patient instructions, educational materials, and care plan.   Daneen Schick, BSW, CDP Social Worker, Certified Dementia Practitioner [place] Management  Care Coordination [phone]

## 2022-06-14 NOTE — Progress Notes (Unsigned)
Patient ID: Austin Mercer., male   DOB: 01-Oct-1950, 72 y.o.   MRN: [id number]  DISCHARGE DIAGNOSES:    Acute ischemic stroke ([place])   Controlled type 2 diabetes mellitus without complication, without long-term current use of insulin ([place])   Essential hypertension   Class 3 severe obesity due to excess calories with serious comorbidity and body mass index (BMI) of 45.0 to 49.9 in adult [facility])   Hepatitis C virus infection cured after antiviral drug therapy   History of alcohol use disorder   Liver cirrhosis ([place])   CVA (cerebral vascular accident) ([place])     RECOMMENDATIONS FOR OUTPATIENT FOLLOW UP: Please check CBC and basic metabolic panel in 3 to 4 days Ambulatory referral sent to neurology for outpatient follow-up  HOSPITAL COURSE:    Acute stroke Recently hospitalized for stroke and was discharged on dual antiplatelet treatment with aspirin and Plavix.  Presented again with new weakness especially in the right lower extremity.  MRI shows new stroke Neurology is following.  They are considering loop recorder placement.  May need to be done in the outpatient setting and will defer to neurology. Patient had extensive neurological workup during his admission recently for his previous stroke. LDL is 59.  HbA1c 6.2. Patient is on aspirin and Plavix.  Also on statin. PT and OT evaluation.  SNF is recommended for short-term rehab.   Chronic systolic CHF/essential hypertension Patient does have bilateral lower extremity edema and is on diuretics at home.  Recent echocardiogram showed normal left ventricular ejection fraction. Continue with home medications.  He is noted to be on carvedilol, furosemide and spironolactone.  Also on ARB which can be resumed at discharge.  Renal function will need to be monitored closely.   Hyperlipidemia Continue with statin   Diabetes mellitus type 2 Continue with home occasions.   History of hepatitis C/liver cirrhosis He has been treated with hepatitis  C with antivirals.  Seems to be stable. No concerns identified on CT scan.   Chronic alcohol use previously Last drink was 2 weeks prior to admission.  No withdrawal symptoms noted.  Continue with thiamine.   Elevated creatinine Creatinine noted to be elevated for the last few days including previous hospitalization.  No known history of chronic kidney disease but he could have Chronic kidney disease stage II-IIIa.  Will need further monitoring in the outpatient setting.   Constipation Patient reported no bowel movement in several days.  Started on laxatives and stool softeners.  Was given suppository with a bowel movement this morning.  Continue with bowel regimen for now.   Morbid obesity Estimated body mass index is 45.61 kg/m as calculated from the following:   Height as of this encounter: '6\' 1"'$  (1.854 m).   Weight as of this encounter: 156.8 kg.

## 2022-06-14 NOTE — Telephone Encounter (Signed)
Telephone encounter was:  Successful.  06/14/2022 Name: Austin Mercer. MRN: [id number] DOB: 1950-12-26  Austin Mercer. is a 72 y.o. year old male who is a primary care patient of Joya Gaskins Burnett Harry, MD . The community resource team was consulted for assistance with Transportation Needs  and Caregiver Stress  Care guide performed the following interventions: Patient provided with information about care guide support team and interviewed to confirm resource needs. Patient friend booked the ride for 06/15/2022 with [facility] transportation patient has appt with PCP for hospital f/u visit will also send information veterans programs, also placed a referal for veterans services for frind to inquire about aid and assistance program eligibility  Follow Up Plan:  No further follow up planned at this time. The patient has been provided with needed resources.  [place] [phone] [address] , [place] [address] Email : Austin [email or web address]. [email or web address]       Austin Mercer. DOB: [birth year] MRN: [id number]   RIDER WAIVER AND RELEASE OF LIABILITY  For purposes of improving physical access to our facilities, [facility] is pleased to partner with third parties to provide [facility] patients or other authorized individuals the option of convenient, on-demand ground transportation services (the [place]") through use of the technology service that enables users to request on-demand ground transportation from independent third-party providers.  By opting to use and accept these [employer], I, the undersigned, hereby agree on behalf of myself, and on behalf of any minor child using the Government social research officer for whom I am the parent or legal guardian, as follows:  Government social research officer provided to me are provided by independent third-party transportation providers who are not [employer] or employees and who are  unaffiliated with [employer]. [place] is neither a transportation carrier nor a common or public carrier. [facility] has no control over the quality or safety of the transportation that occurs as a result of the [employer]. [facility] cannot guarantee that any third-party transportation provider will complete any arranged transportation service. [facility] makes no representation, warranty, or guarantee regarding the reliability, timeliness, quality, safety, suitability, or availability of any of the Transport Services or that they will be error free. I fully understand that traveling by vehicle involves risks and dangers of serious bodily injury, including permanent disability, paralysis, and death. I agree, on behalf of myself and on behalf of any minor child using the Transport Services for whom I am the parent or legal guardian, that the entire risk arising out of my use of the [employer] remains solely with me, to the maximum extent permitted under applicable law. The [employer] are provided "as is" and "as available." [facility] disclaims all representations and warranties, express, implied or statutory, not expressly set out in these terms, including the implied warranties of merchantability and fitness for a particular purpose. I hereby waive and release [place], its agents, employees, officers, directors, representatives, insurers, attorneys, assigns, successors, subsidiaries, and affiliates from any and all past, present, or future claims, demands, liabilities, actions, causes of action, or suits of any kind directly or indirectly arising from acceptance and use of the [employer]. I further waive and release [place] and its affiliates from all present and future liability and responsibility for any injury or death to persons or damages to property caused by or related to the use of the [employer]. I have read this Waiver and  Release of  Liability, and I understand the terms used in it and their legal significance. This Waiver is freely and voluntarily given with the understanding that my right (as well as the right of any minor child for whom I am the parent or legal guardian using the [employer]) to legal recourse against [place] in connection with the [employer] is knowingly surrendered in return for use of these services.   I attest that I read the consent document to Antwon Tally Mercer., gave Mr. Wark the opportunity to ask questions and answered the questions asked (if any). I affirm that Kylen Tally Mercer. then provided consent for he's participation in this program.     [name]

## 2022-06-14 NOTE — Progress Notes (Signed)
Patient is scheduled by Katherine Basset

## 2022-06-15 ENCOUNTER — Encounter: Payer: Self-pay | Admitting: Physician Assistant

## 2022-06-15 ENCOUNTER — Other Ambulatory Visit: Payer: Self-pay

## 2022-06-15 ENCOUNTER — Telehealth: Payer: Self-pay | Admitting: *Deleted

## 2022-06-15 ENCOUNTER — Ambulatory Visit: Payer: No Typology Code available for payment source | Attending: Physician Assistant | Admitting: Physician Assistant

## 2022-06-15 VITALS — BP 134/74 | HR 66 | Wt 342.2 lb

## 2022-06-15 DIAGNOSIS — I639 Cerebral infarction, unspecified: Secondary | ICD-10-CM | POA: Diagnosis not present

## 2022-06-15 DIAGNOSIS — I10 Essential (primary) hypertension: Secondary | ICD-10-CM

## 2022-06-15 DIAGNOSIS — Z09 Encounter for follow-up examination after completed treatment for conditions other than malignant neoplasm: Secondary | ICD-10-CM

## 2022-06-15 DIAGNOSIS — E1129 Type 2 diabetes mellitus with other diabetic kidney complication: Secondary | ICD-10-CM

## 2022-06-15 DIAGNOSIS — Z87898 Personal history of other specified conditions: Secondary | ICD-10-CM

## 2022-06-15 DIAGNOSIS — I5022 Chronic systolic (congestive) heart failure: Secondary | ICD-10-CM | POA: Diagnosis not present

## 2022-06-15 DIAGNOSIS — R809 Proteinuria, unspecified: Secondary | ICD-10-CM | POA: Diagnosis not present

## 2022-06-15 DIAGNOSIS — E782 Mixed hyperlipidemia: Secondary | ICD-10-CM | POA: Diagnosis not present

## 2022-06-15 LAB — GLUCOSE, POCT (MANUAL RESULT ENTRY): POC Glucose: 137 mg/dl — AB (ref 70–99)

## 2022-06-15 MED ORDER — LOSARTAN POTASSIUM 100 MG PO TABS
100.0000 mg | ORAL_TABLET | Freq: Every day | ORAL | 2 refills | Status: DC
  Filled 2022-06-15 – 2022-08-18 (×2): qty 90, 90d supply, fill #0
  Filled 2022-11-10: qty 90, 90d supply, fill #1
  Filled 2023-03-05: qty 90, 90d supply, fill #2

## 2022-06-15 MED ORDER — POLYETHYLENE GLYCOL 3350 17 G PO PACK
17.0000 g | PACK | Freq: Every day | ORAL | 0 refills | Status: DC
  Filled 2022-06-15: qty 14, 14d supply, fill #0

## 2022-06-15 MED ORDER — POTASSIUM CHLORIDE ER 8 MEQ PO TBCR
16.0000 meq | EXTENDED_RELEASE_TABLET | Freq: Two times a day (BID) | ORAL | 6 refills | Status: DC
  Filled 2022-06-15 – 2022-07-05 (×3): qty 120, 30d supply, fill #0
  Filled 2022-07-20: qty 120, 30d supply, fill #1
  Filled 2022-08-18: qty 120, 30d supply, fill #2
  Filled 2022-09-18: qty 120, 30d supply, fill #3

## 2022-06-15 MED ORDER — TRAMADOL HCL 50 MG PO TABS: 50.0000 mg | ORAL_TABLET | Freq: Two times a day (BID) | ORAL | 0 refills | Status: DC | PRN

## 2022-06-15 MED ORDER — FUROSEMIDE 40 MG PO TABS
40.0000 mg | ORAL_TABLET | Freq: Every day | ORAL | 0 refills | Status: DC
  Filled 2022-06-15 – 2022-08-18 (×2): qty 30, 30d supply, fill #0

## 2022-06-15 MED ORDER — DAPAGLIFLOZIN PROPANEDIOL 10 MG PO TABS
10.0000 mg | ORAL_TABLET | Freq: Every day | ORAL | 0 refills | Status: DC
  Filled 2022-06-15: qty 90, 90d supply, fill #0
  Filled 2022-06-22: qty 30, 30d supply, fill #0
  Filled 2022-07-17: qty 30, 30d supply, fill #1
  Filled 2022-08-18: qty 30, 30d supply, fill #2

## 2022-06-15 MED ORDER — CARVEDILOL 25 MG PO TABS
25.0000 mg | ORAL_TABLET | Freq: Two times a day (BID) | ORAL | 3 refills | Status: DC
  Filled 2022-06-15 – 2022-07-05 (×3): qty 60, 30d supply, fill #0
  Filled 2022-07-20: qty 180, 90d supply, fill #1

## 2022-06-15 MED ORDER — CLOPIDOGREL BISULFATE 75 MG PO TABS
75.0000 mg | ORAL_TABLET | Freq: Every day | ORAL | 0 refills | Status: DC
  Filled 2022-06-15 – 2022-08-18 (×2): qty 90, 90d supply, fill #0

## 2022-06-15 MED ORDER — FAMOTIDINE 20 MG PO TABS
20.0000 mg | ORAL_TABLET | Freq: Every day | ORAL | 0 refills | Status: DC
  Filled 2022-06-15 – 2022-08-18 (×2): qty 30, 30d supply, fill #0

## 2022-06-15 MED ORDER — SPIRONOLACTONE 25 MG PO TABS
25.0000 mg | ORAL_TABLET | Freq: Every day | ORAL | 0 refills | Status: DC
  Filled 2022-06-15 – 2022-08-18 (×2): qty 90, 90d supply, fill #0

## 2022-06-15 MED ORDER — CARVEDILOL 25 MG PO TABS
25.0000 mg | ORAL_TABLET | Freq: Two times a day (BID) | ORAL | 0 refills | Status: DC
  Filled 2022-06-15: qty 60, 30d supply, fill #0

## 2022-06-15 MED ORDER — ATORVASTATIN CALCIUM 20 MG PO TABS
20.0000 mg | ORAL_TABLET | Freq: Every day | ORAL | 0 refills | Status: DC
  Filled 2022-06-15 – 2022-06-22 (×2): qty 30, 30d supply, fill #0

## 2022-06-15 MED ORDER — TRAMADOL HCL 50 MG PO TABS
50.0000 mg | ORAL_TABLET | Freq: Two times a day (BID) | ORAL | 0 refills | Status: DC | PRN
  Filled 2022-06-15: qty 10, 5d supply, fill #0

## 2022-06-15 MED ORDER — METFORMIN HCL 500 MG PO TABS
500.0000 mg | ORAL_TABLET | Freq: Two times a day (BID) | ORAL | 0 refills | Status: DC
  Filled 2022-06-15 – 2022-08-18 (×2): qty 180, 90d supply, fill #0

## 2022-06-15 NOTE — Patient Instructions (Signed)
[place].org is the website for narcotics anonymous  Nc23.org (website) or [phone] is the information for alcoholics anonymous  Both are free and immediately available for help with alcohol and drug use  

## 2022-06-15 NOTE — Telephone Encounter (Signed)
   Telephone encounter was:  Successful.  06/15/2022 Name: [name]. MRN: [id number] DOB: 07/30/1950  [place] Jlynn Langille. is a 72 y.o. year old male who is a primary care patient of Joya Gaskins Burnett Harry, MD . The community resource team was consulted for assistance with Transportation Needs   Care guide performed the following interventions: Follow up call placed to the patient to discuss status of referral. Patient also provided information and application for access gso Follow Up Plan:  No further follow up planned at this time. The patient has been provided with needed resources.  [place] [phone] [address] , [place] [address] Email : [name]

## 2022-06-15 NOTE — Telephone Encounter (Signed)
Transportion has been obtain per [facility].

## 2022-06-16 DIAGNOSIS — K219 Gastro-esophageal reflux disease without esophagitis: Secondary | ICD-10-CM | POA: Diagnosis not present

## 2022-06-16 DIAGNOSIS — M6281 Muscle weakness (generalized): Secondary | ICD-10-CM | POA: Diagnosis not present

## 2022-06-16 DIAGNOSIS — E785 Hyperlipidemia, unspecified: Secondary | ICD-10-CM | POA: Diagnosis not present

## 2022-06-16 DIAGNOSIS — K7460 Unspecified cirrhosis of liver: Secondary | ICD-10-CM | POA: Diagnosis not present

## 2022-06-16 DIAGNOSIS — I5022 Chronic systolic (congestive) heart failure: Secondary | ICD-10-CM | POA: Diagnosis not present

## 2022-06-16 DIAGNOSIS — I69398 Other sequelae of cerebral infarction: Secondary | ICD-10-CM | POA: Diagnosis not present

## 2022-06-16 DIAGNOSIS — I110 Hypertensive heart disease with heart failure: Secondary | ICD-10-CM | POA: Diagnosis not present

## 2022-06-16 DIAGNOSIS — E1122 Type 2 diabetes mellitus with diabetic chronic kidney disease: Secondary | ICD-10-CM | POA: Diagnosis not present

## 2022-06-16 DIAGNOSIS — N183 Chronic kidney disease, stage 3 unspecified: Secondary | ICD-10-CM | POA: Diagnosis not present

## 2022-06-19 DIAGNOSIS — K7460 Unspecified cirrhosis of liver: Secondary | ICD-10-CM | POA: Diagnosis not present

## 2022-06-19 DIAGNOSIS — I5022 Chronic systolic (congestive) heart failure: Secondary | ICD-10-CM | POA: Diagnosis not present

## 2022-06-19 DIAGNOSIS — E785 Hyperlipidemia, unspecified: Secondary | ICD-10-CM | POA: Diagnosis not present

## 2022-06-19 DIAGNOSIS — E1122 Type 2 diabetes mellitus with diabetic chronic kidney disease: Secondary | ICD-10-CM | POA: Diagnosis not present

## 2022-06-19 DIAGNOSIS — K219 Gastro-esophageal reflux disease without esophagitis: Secondary | ICD-10-CM | POA: Diagnosis not present

## 2022-06-19 DIAGNOSIS — M6281 Muscle weakness (generalized): Secondary | ICD-10-CM | POA: Diagnosis not present

## 2022-06-19 DIAGNOSIS — I69398 Other sequelae of cerebral infarction: Secondary | ICD-10-CM | POA: Diagnosis not present

## 2022-06-19 DIAGNOSIS — I110 Hypertensive heart disease with heart failure: Secondary | ICD-10-CM | POA: Diagnosis not present

## 2022-06-19 DIAGNOSIS — N183 Chronic kidney disease, stage 3 unspecified: Secondary | ICD-10-CM | POA: Diagnosis not present

## 2022-06-20 ENCOUNTER — Telehealth: Payer: Self-pay | Admitting: *Deleted

## 2022-06-20 NOTE — Patient Outreach (Signed)
  Care Coordination   06/20/2022 Name: Austin Mercer. MRN: [id number] DOB: 1950-11-22   Care Coordination Outreach Attempts:  An unsuccessful telephone outreach was attempted today to offer the patient information about available care coordination services as a benefit of their health plan.   Follow Up Plan:  Additional outreach attempts will be made to offer the patient care coordination information and services.   Encounter Outcome:  No Answer   Care Coordination Interventions:  No, not indicated    Alder Murri C. Myrtie Neither, MSN, [facility] Gerontological Nurse Practitioner [facility] Care Management [phone]

## 2022-06-21 DIAGNOSIS — I69398 Other sequelae of cerebral infarction: Secondary | ICD-10-CM | POA: Diagnosis not present

## 2022-06-21 DIAGNOSIS — I5022 Chronic systolic (congestive) heart failure: Secondary | ICD-10-CM | POA: Diagnosis not present

## 2022-06-21 DIAGNOSIS — K7460 Unspecified cirrhosis of liver: Secondary | ICD-10-CM | POA: Diagnosis not present

## 2022-06-21 DIAGNOSIS — M6281 Muscle weakness (generalized): Secondary | ICD-10-CM | POA: Diagnosis not present

## 2022-06-21 DIAGNOSIS — N183 Chronic kidney disease, stage 3 unspecified: Secondary | ICD-10-CM | POA: Diagnosis not present

## 2022-06-21 DIAGNOSIS — I110 Hypertensive heart disease with heart failure: Secondary | ICD-10-CM | POA: Diagnosis not present

## 2022-06-21 DIAGNOSIS — E785 Hyperlipidemia, unspecified: Secondary | ICD-10-CM | POA: Diagnosis not present

## 2022-06-21 DIAGNOSIS — K219 Gastro-esophageal reflux disease without esophagitis: Secondary | ICD-10-CM | POA: Diagnosis not present

## 2022-06-21 DIAGNOSIS — E1122 Type 2 diabetes mellitus with diabetic chronic kidney disease: Secondary | ICD-10-CM | POA: Diagnosis not present

## 2022-06-21 NOTE — Patient Outreach (Signed)
  Care Coordination    06/21/2022 Name: Austin Mercer. MRN: [id number] DOB: 1950/09/18  Austin Mercer. is a 72 y.o. year old male who sees [name], MD for primary care. I  collaborated with patients primary care providers office to follow up on the status of patients Part B SCAT application. SW has already submitted Part A to [facility] Access.   SDOH assessments and interventions completed:  No     Care Coordination Interventions:  Yes, provided   Interventions Today    Flowsheet Row Most Recent Value  Chronic Disease   Chronic disease during today's visit Hypertension (HTN)  General Interventions   General Interventions Discussed/Reviewed Communication with  Communication with PCP/Specialists  [to follow up on status of SCAT app part B,  Part A already submitted to [facility] Access]        Follow up plan:  SW will continue to follow    Encounter Outcome:  Pt. Visit Completed   Daneen Schick, Arita Miss, CDP Social Worker, Certified Dementia Practitioner [place] Coordination [phone]

## 2022-06-22 ENCOUNTER — Ambulatory Visit: Payer: No Typology Code available for payment source | Attending: Critical Care Medicine

## 2022-06-22 ENCOUNTER — Ambulatory Visit: Payer: Self-pay | Admitting: *Deleted

## 2022-06-22 ENCOUNTER — Other Ambulatory Visit: Payer: Self-pay

## 2022-06-22 DIAGNOSIS — R809 Proteinuria, unspecified: Secondary | ICD-10-CM

## 2022-06-22 DIAGNOSIS — I639 Cerebral infarction, unspecified: Secondary | ICD-10-CM

## 2022-06-22 DIAGNOSIS — I10 Essential (primary) hypertension: Secondary | ICD-10-CM

## 2022-06-22 NOTE — Patient Outreach (Signed)
  Care Coordination   06/22/2022 Name: Austin Mercer. MRN: [id number] DOB: [birth year]   Care Coordination Outreach Attempts:  A second unsuccessful outreach was attempted today to offer the patient with information about available care coordination services as a benefit of their health plan.     Follow Up Plan:  Additional outreach attempts will be made to offer the patient care coordination information and services.   Encounter Outcome:  No Answer   Care Coordination Interventions:  No, not indicated    Baila Rouse C. Myrtie Neither, MSN, [facility] Gerontological Nurse Practitioner [facility] Care Management [phone]

## 2022-06-22 NOTE — Patient Outreach (Signed)
  Care Coordination    06/22/2022 Name: Austin Mercer. MRN: [id number] DOB: [birth year]  Austin Mercer. is a 72 y.o. year old male who sees Austin Stain, MD for primary care. I  received Part B of patients SCAT application from patients primary care providers office. I collaborated with Austin Mercer with [place] Access to submit patients completed SCAT application.  SDOH assessments and interventions completed:  No     Care Coordination Interventions:  Yes, provided   Interventions Today    Flowsheet Row Most Recent Value  Chronic Disease   Chronic disease during today's visit Hypertension (HTN)  General Interventions   General Interventions Discussed/Reviewed Communication with  Communication with --  [[place] Access]       Follow up plan:  SW will continue to follow.    Encounter Outcome:  Pt. Visit Completed   Daneen Schick, BSW, CDP Social Worker, Certified Dementia Practitioner [place] Management  Care Coordination [phone]

## 2022-06-23 ENCOUNTER — Other Ambulatory Visit: Payer: Self-pay | Admitting: Physician Assistant

## 2022-06-23 ENCOUNTER — Telehealth: Payer: Self-pay

## 2022-06-23 ENCOUNTER — Other Ambulatory Visit: Payer: Self-pay

## 2022-06-23 LAB — CBC WITH DIFFERENTIAL/PLATELET
Basophils Absolute: 0.1 10*3/uL (ref 0.0–0.2)
Basos: 1 %
EOS (ABSOLUTE): 0.6 10*3/uL — ABNORMAL HIGH (ref 0.0–0.4)
Eos: 8 %
Hematocrit: 39.2 % (ref 37.5–51.0)
Hemoglobin: 12.9 g/dL — ABNORMAL LOW (ref 13.0–17.7)
Immature Grans (Abs): 0 10*3/uL (ref 0.0–0.1)
Immature Granulocytes: 0 %
Lymphocytes Absolute: 3.7 10*3/uL — ABNORMAL HIGH (ref 0.7–3.1)
Lymphs: 48 %
MCH: 25.5 pg — ABNORMAL LOW (ref 26.6–33.0)
MCHC: 32.9 g/dL (ref 31.5–35.7)
MCV: 78 fL — ABNORMAL LOW (ref 79–97)
Monocytes Absolute: 0.9 10*3/uL (ref 0.1–0.9)
Monocytes: 12 %
Neutrophils Absolute: 2.4 10*3/uL (ref 1.4–7.0)
Neutrophils: 31 %
Platelets: 144 10*3/uL — ABNORMAL LOW (ref 150–450)
RBC: 5.05 x10E6/uL (ref 4.14–5.80)
RDW: 14.4 % (ref 11.6–15.4)
WBC: 7.7 10*3/uL (ref 3.4–10.8)

## 2022-06-23 LAB — COMPREHENSIVE METABOLIC PANEL
ALT: 10 IU/L (ref 0–44)
AST: 13 IU/L (ref 0–40)
Albumin/Globulin Ratio: 1.2 (ref 1.2–2.2)
Albumin: 4.1 g/dL (ref 3.9–4.9)
Alkaline Phosphatase: 56 IU/L (ref 44–121)
BUN/Creatinine Ratio: 17 (ref 10–24)
BUN: 30 mg/dL — ABNORMAL HIGH (ref 8–27)
Bilirubin Total: 0.5 mg/dL (ref 0.0–1.2)
CO2: 19 mmol/L — ABNORMAL LOW (ref 20–29)
Calcium: 11.6 mg/dL — ABNORMAL HIGH (ref 8.6–10.2)
Chloride: 104 mmol/L (ref 96–106)
Creatinine, Ser: 1.76 mg/dL — ABNORMAL HIGH (ref 0.76–1.27)
Globulin, Total: 3.3 g/dL (ref 1.5–4.5)
Glucose: 119 mg/dL — ABNORMAL HIGH (ref 70–99)
Potassium: 5 mmol/L (ref 3.5–5.2)
Sodium: 136 mmol/L (ref 134–144)
Total Protein: 7.4 g/dL (ref 6.0–8.5)
eGFR: 41 mL/min/{1.73_m2} — ABNORMAL LOW (ref >59)

## 2022-06-23 NOTE — Telephone Encounter (Signed)
-----   Message from Argentina Donovan, [place] sent at 06/23/2022 10:04 AM EDT ----- Please call patient.  Calcium levels are elevated and need additional work up.  I have ordered labs.  Please schedule him a lab appt in about 2 weeks.  Kidney function has worsened a little.  Make sure he is drinking adequate water.  Also platelets are low-this can put him at risk for bleeding.  Avoid drinking alcohol in any form.  Alcohol can worsen low platelets.  Thanks, Freeman Cald[place], PA-C

## 2022-06-23 NOTE — Telephone Encounter (Signed)
Pt was called and vm was left, Information has been sent to nurse pool.   

## 2022-06-26 ENCOUNTER — Encounter: Payer: Self-pay | Admitting: Cardiology

## 2022-06-26 ENCOUNTER — Telehealth: Payer: Self-pay

## 2022-06-26 ENCOUNTER — Ambulatory Visit: Payer: Medicare PPO | Attending: Cardiology | Admitting: Cardiology

## 2022-06-26 DIAGNOSIS — I639 Cerebral infarction, unspecified: Secondary | ICD-10-CM | POA: Diagnosis not present

## 2022-06-26 NOTE — Progress Notes (Signed)
Electrophysiology Office Note   Date:  06/26/2022   ID:  Austin Sobotta., DOB [birth year], MRN [id number]  PCP:  [name], MD  Cardiologist:   Primary Electrophysiologist:  Xyla Leisner Meredith Leeds, MD    Chief Complaint: CVA   History of Present Illness: Austin Wong Watson. is a 72 y.o. male who is being seen today for the evaluation of CVA at the request of Rosalin Hawking, MD. Presenting today for electrophysiology evaluation.  He has a history significant for type 2 diabetes, hypertension, morbid obesity, cirrhosis.  He presented to the hospital February 2024 with CVA.  No cause has thus far been found.  He presents to clinic today for possible ILR implant.  Today, he denies symptoms of palpitations, chest pain, shortness of breath, orthopnea, PND, lower extremity edema, claudication, dizziness, presyncope, syncope, bleeding, or neurologic sequela. The patient is tolerating medications without difficulties.    Past Medical History:  Diagnosis Date   Diabetes ([place])    Hypertension    Hypertensive urgency 03/04/2020   Stroke [facility])    Past Surgical History:  Procedure Laterality Date   ANKLE FRACTURE SURGERY       Current Outpatient Medications  Medication Sig Dispense Refill   Accu-Chek Softclix Lancets lancets Use to check blood sugar twice daily 100 each 6   aspirin EC 81 MG tablet Take 1 tablet (81 mg total) by mouth daily. Swallow whole. (Patient taking differently: Take 81 mg by mouth daily.) 30 tablet 12   atorvastatin (LIPITOR) 20 MG tablet Take 1 tablet (20 mg total) by mouth daily. 30 tablet 0   Blood Glucose Monitoring Suppl (ACCU-CHEK GUIDE) w/Device KIT Use to check blood sugar twice daily 1 kit 0   Blood Pressure Monitoring (BLOOD PRESSURE KIT) DEVI Use to measure blood pressure 1 each 0   carvedilol (COREG) 25 MG tablet Take 1 tablet (25 mg total) by mouth 2 (two) times daily with a meal. 60 tablet 3   clopidogrel (PLAVIX) 75 MG tablet Take 1 tablet (75 mg  total) by mouth daily. 90 tablet 0   dapagliflozin propanediol (FARXIGA) 10 MG TABS tablet Take 1 tablet (10 mg total) by mouth once daily before breakfast. 90 tablet 0   famotidine (PEPCID) 20 MG tablet Take 1 tablet (20 mg total) by mouth daily. 30 tablet 0   furosemide (LASIX) 40 MG tablet Take 1 tablet (40 mg total) by mouth daily. 30 tablet 0   glucose blood (ACCU-CHEK GUIDE) test strip Use to check blood sugar twice daily 100 each 6   losartan (COZAAR) 100 MG tablet Take 1 tablet (100 mg total) by mouth once daily. 90 tablet 2   metFORMIN (GLUCOPHAGE) 500 MG tablet Take 1 tablet (500 mg total) by mouth 2 (two) times daily with a meal. 180 tablet 0   potassium chloride (KLOR-CON) 8 MEQ tablet Take 2 tablets (16 mEq total) by mouth 2 (two) times daily. 120 tablet 6   senna-docusate (SENOKOT-S) 8.6-50 MG tablet Take 2 tablets by mouth 2 (two) times daily. 60 tablet 0   spironolactone (ALDACTONE) 25 MG tablet Take 1 tablet (25 mg total) by mouth once daily. 90 tablet 0   thiamine (VITAMIN B-1) 100 MG tablet Take 1 tablet (100 mg total) by mouth daily. 30 tablet 0   traMADol (ULTRAM) 50 MG tablet Take 1 tablet (50 mg total) by mouth every 12 (twelve) hours as needed for severe pain. 10 tablet 0   polyethylene glycol (MIRALAX / GLYCOLAX) 17 g  packet Mix 17 g in 4-8 ounces of juice/water and take by mouth daily. (Patient not taking: Reported on 06/26/2022) 14 each 0   No current facility-administered medications for this visit.    Allergies:   Codeine and Latex   Social History:  The patient  reports that he quit smoking about 51 years ago. His smoking use included cigarettes. He has never used smokeless tobacco. He reports current alcohol use. He reports that he does not currently use drugs.   Family History:  The patient's family history includes Diabetes in his father.    ROS:  Please see the history of present illness.   Otherwise, review of systems is positive for none.   All other systems  are reviewed and negative.    PHYSICAL EXAM: VS:  There were no vitals taken for this visit. , BMI There is no height or weight on file to calculate BMI. GEN: Well nourished, well developed, in no acute distress  HEENT: normal  Neck: no JVD, carotid bruits, or masses Cardiac: RRR; no murmurs, rubs, or gallops,no edema  Respiratory:  clear to auscultation bilaterally, normal work of breathing GI: soft, nontender, nondistended, + BS MS: no deformity or atrophy  Skin: warm and dry Neuro:  Strength and sensation are intact Psych: euthymic mood, full affect  EKG:  EKG is not ordered today. Personal review of the ekg ordered 05/11/22 shows sinus rhythm 1dAVB  Recent Labs: 05/16/2022: Magnesium 1.9 06/22/2022: ALT 10; BUN 30; Creatinine, Ser 1.76; Hemoglobin 12.9; Platelets 144; Potassium 5.0; Sodium 136    Lipid Panel     Component Value Date/Time   CHOL 109 05/18/2022 0626   CHOL 108 02/23/2021 1125   TRIG 106 05/18/2022 0626   HDL 29 (L) 05/18/2022 0626   HDL 36 (L) 02/23/2021 1125   CHOLHDL 3.8 05/18/2022 0626   VLDL 21 05/18/2022 0626   LDLCALC 59 05/18/2022 0626   LDLCALC 52 02/23/2021 1125     Wt Readings from Last 3 Encounters:  06/15/22 (!) 342 lb 3.2 oz (155.2 kg)  05/17/22 (!) 345 lb 10.9 oz (156.8 kg)  05/13/22 (!) 344 lb 9.3 oz (156.3 kg)      Other studies Reviewed: Additional studies/ records that were reviewed today include: TTE 05/11/22  Review of the above records today demonstrates:   1. Left ventricular ejection fraction, by estimation, is 55 to 60%. The  left ventricle has normal function. The left ventricle has no regional  wall motion abnormalities. There is moderate left ventricular hypertrophy.  Left ventricular diastolic  parameters are consistent with Grade I diastolic dysfunction (impaired  relaxation).   2. Right ventricular systolic function is normal. The right ventricular  size is normal.   3. The mitral valve is normal in structure. Mild  mitral valve  regurgitation. No evidence of mitral stenosis.   4. The aortic valve has an indeterminant number of cusps. Aortic valve  regurgitation is moderate. Mild aortic valve stenosis.   5. Aortic dilatation noted. There is borderline dilatation of the aortic  root, measuring 39 mm.   6. The inferior vena cava is normal in size with greater than 50%  respiratory variability, suggesting right atrial pressure of 3 mmHg.    ASSESSMENT AND PLAN:  1.  Cryptogenic Stroke: Had bilateral parieto-occipital cardioembolic strokes and received TNK.  No causes thus far been found.  He had an echo that showed normal ejection fraction.  Imaging showed no carotid disease that was suggestive of a cause for embolus.  He would thus benefit from ILR implant.  Risk and benefits were discussed which include bleeding and infection.  He would like to further consider whether or not to implant ILR.  He is concerned over the incision as well as the foreign body.  He Austin Mercer call us back if he wants to proceed.    Current medicines are reviewed at length with the patient today.   The patient does not have concerns regarding his medicines.  The following changes were made today:  none  Labs/ tests ordered today include:  No orders of the defined types were placed in this encounter.    Disposition:   FU with Sharona Rovner  pending ILR results  Signed, Sandie Swayze Meredith Leeds, MD  06/26/2022 12:44 PM     [facility] [address] [place] [address] [phone] (office) [phone] (fax)

## 2022-06-26 NOTE — Telephone Encounter (Signed)
Pt was called and vm was left, Information has been sent to nurse pool.   

## 2022-06-26 NOTE — Telephone Encounter (Signed)
-----   Message from Argentina Donovan, [place] sent at 06/23/2022 10:04 AM EDT ----- Please call patient.  Calcium levels are elevated and need additional work up.  I have ordered labs.  Please schedule him a lab appt in about 2 weeks.  Kidney function has worsened a little.  Make sure he is drinking adequate water.  Also platelets are low-this can put him at risk for bleeding.  Avoid drinking alcohol in any form.  Alcohol can worsen low platelets.  Thanks, Freeman Cald[place], PA-C

## 2022-06-27 ENCOUNTER — Ambulatory Visit: Payer: Self-pay

## 2022-06-27 NOTE — Patient Outreach (Signed)
  Care Coordination   Initial Visit Note   06/27/2022 Name: Austin Mercer. MRN: [id number] DOB: 03-Jul-1950  Austin Mercer. is a 72 y.o. year old male who sees [name], MD for primary care. I spoke with Austin Mercer., and sister Austin Mercer by phone today.  What matters to the patients health and wellness today?  Patient would like to have his right knee pain evaluated and treated. He would like to get help with housing.     Goals Addressed             This Visit's Progress    I need help with housing       Care Coordination Interventions: Provided written and verbal education re: potential causes of falls and Fall prevention strategies Determined patient has an apartment with about 15 stairs he must climb Determined due to patient's morbid obesity, disability from previous stroke and limitations to right knee due to chronic pain and arthritis, patient is at risk for falling  Determined patient would like help with housing, per his sister patient is currently unable to live alone and has a friend caring for him Sent in basket message to [place] requesting she reach out to patient/sister to discuss options for housing        To get some help with my right knee pain       Care Coordination Interventions: Determined patient feels his chronic right knee pain has worsened and this is interfering with his mobility Reviewed provider established plan for pain management Discussed importance of adherence to all scheduled medical appointments Counseled on the importance of reporting any/all new or changed pain symptoms or management strategies to pain management provider Advised patient to report to care team affect of pain on daily activities Determined patient has not follow up with Orthopedic in about 2 year, however he is working with PT Educated patient and sister Austin Mercer about [facility] for urgent and chronic orthopedic needs Reviewed and discussed patient  and sister may walk in or schedule an appointment and a PCP referral is not needed  Provided sister Austin Mercer the name, location and phone number for this provider Advised patient to discuss his desire to receive Cortisone injection to his right knee with Ortho provider       Interventions Today    Flowsheet Row Most Recent Value  Chronic Disease   Chronic disease during today's visit Hypertension (HTN), Other  [chronic right knee pain]  General Interventions   General Interventions Discussed/Reviewed General Interventions Discussed, General Interventions Reviewed, Communication with, Level of Care  Communication with Social Work  Level of Care --  [housing options]  Exercise Interventions   Exercise Discussed/Reviewed Physical Activity, Exercise Discussed, Exercise Reviewed  Physical Activity Discussed/Reviewed Physical Activity Discussed, Physical Activity Reviewed, Home Exercise Program (HEP)  Education Interventions   Education Provided Provided Education  Provided Verbal Education On When to see the doctor  Safety Interventions   Safety Discussed/Reviewed Safety Discussed, Safety Reviewed, Fall Risk, Home Safety          SDOH assessments and interventions completed:  No     Care Coordination Interventions:  Yes, provided   Follow up plan: Referral made to [facility] to assist patient with housing  Follow up call scheduled for 07/11/22 @09 :30 AM    Encounter Outcome:  Pt. Visit Completed

## 2022-06-27 NOTE — Patient Instructions (Signed)
Visit Information  Thank you for taking time to visit with me today. Please don't hesitate to contact me if I can be of assistance to you.   Following are the goals we discussed today:   Goals Addressed             This Visit's Progress    I need help with housing       Care Coordination Interventions: Provided written and verbal education re: potential causes of falls and Fall prevention strategies Determined patient has an apartment with about 15 stairs he must climb Determined due to patient's morbid obesity, disability from previous stroke and limitations to right knee due to chronic pain and arthritis, patient is at risk for falling  Determined patient would like help with housing, per his sister patient is currently unable to live alone and has a friend caring for him Sent in basket message to [place] requesting she reach out to patient/sister to discuss options for housing        To get some help with my right knee pain       Care Coordination Interventions: Determined patient feels his chronic right knee pain has worsened and this is interfering with his mobility Reviewed provider established plan for pain management Discussed importance of adherence to all scheduled medical appointments Counseled on the importance of reporting any/all new or changed pain symptoms or management strategies to pain management provider Advised patient to report to care team affect of pain on daily activities Determined patient has not follow up with Orthopedic in about 2 year, however he is working with PT Educated patient and sister [name] about [facility] for urgent and chronic orthopedic needs Reviewed and discussed patient and sister may walk in or schedule an appointment and a PCP referral is not needed  Provided sister [name] the name, location and phone number for this provider Advised patient to discuss his desire to receive Cortisone injection to his right knee with Ortho  provider           Our next appointment is by telephone on 07/11/22 at 09:30 AM  Please call the care guide team at [phone] if you need to cancel or reschedule your appointment.   If you are experiencing a Mental Health or [place] or need someone to talk to, please call 1-800-273-TALK (toll free, 24 hour hotline) go to [facility] [place] [phone])  The patient verbalized understanding of instructions, educational materials, and care plan provided today and DECLINED offer to receive copy of patient instructions, educational materials, and care plan.   Barb Merino, RN, BSN, CCM Care Management Coordinator [facility] Care Management Direct Phone: [phone]

## 2022-06-28 DIAGNOSIS — I69398 Other sequelae of cerebral infarction: Secondary | ICD-10-CM | POA: Diagnosis not present

## 2022-06-28 DIAGNOSIS — K7460 Unspecified cirrhosis of liver: Secondary | ICD-10-CM | POA: Diagnosis not present

## 2022-06-28 DIAGNOSIS — M6281 Muscle weakness (generalized): Secondary | ICD-10-CM | POA: Diagnosis not present

## 2022-06-28 DIAGNOSIS — E785 Hyperlipidemia, unspecified: Secondary | ICD-10-CM | POA: Diagnosis not present

## 2022-06-28 DIAGNOSIS — N183 Chronic kidney disease, stage 3 unspecified: Secondary | ICD-10-CM | POA: Diagnosis not present

## 2022-06-28 DIAGNOSIS — K219 Gastro-esophageal reflux disease without esophagitis: Secondary | ICD-10-CM | POA: Diagnosis not present

## 2022-06-28 DIAGNOSIS — I5022 Chronic systolic (congestive) heart failure: Secondary | ICD-10-CM | POA: Diagnosis not present

## 2022-06-28 DIAGNOSIS — I110 Hypertensive heart disease with heart failure: Secondary | ICD-10-CM | POA: Diagnosis not present

## 2022-06-28 DIAGNOSIS — E1122 Type 2 diabetes mellitus with diabetic chronic kidney disease: Secondary | ICD-10-CM | POA: Diagnosis not present

## 2022-06-29 NOTE — Progress Notes (Signed)
[facility] [address] [place]. [place] [address] [phone]       HOSPITAL FOLLOW UP NOTE  Mr. Austin Mercer. Date of Birth:  Feb 03, 1951 Medical Record Number:  [id number]   Reason for Referral:  hospital stroke follow up    SUBJECTIVE:   CHIEF COMPLAINT:  Chief Complaint  Patient presents with   New Patient (Initial Visit)    Patient in room #8 with his friend Malachy Mood. Patient states he is well and stable, no new concerns.    HPI:   Mr. Austin Mercer. is a 72 y.o. male with history of obesity, HTN, DM who presented to the [facility] ED via EMS on 05/11/2022 for evaluation of acute onset right side numbness and tingling and dizziness.  While in ED, patient had sudden neurological change where he became globally aphasic, right-sided weakness, right-sided neglect and left gaze preference.  Stroke workup revealed bilateral parieto-occipital embolic infarcts s/p TNK likely cardioembolic of unclear source.  CTA head/neck showed advanced and widespread intracranial arthrosclerosis (noted under pertinent imaging below).  EF 55 to 60%.  LE Doppler negative for DVT.  EEG moderate diffuse encephalopathy, no seizures.  LDL 56.  A1c 6.4.  Recommended outpatient cardiology follow-up for loop recorder placement.  Recommended DAPT for 3 weeks and aspirin alone and increased home dose atorvastatin from 10 mg to 20 mg daily.  Resolution of stroke deficits and discharged home without therapy needs.  He returned to ED on 05/16/2022 with difficulty ambulating and difficulty standing on his own.  Neurological exam notable for mild RLE weakness.  MRI brain revealed acute infarct in the left pons most consistent with small vessel disease source.  No need for repeat stroke workup was recently completed.  Recommended ongoing use of aspirin and Plavix for total of 3 months then aspirin alone.  Therapies recommended SNF rehab prior to returning home.    Today, 07/03/2022, patient is being seen  for initial hospital follow-up accompanied by his friend, Malachy Mood.  He has since returned home on 2/27.  Reports overall doing well.  He has chronic right knee pain which has been more bothersome since his stroke.  Currently working with home health PT, use of RW, denies any recent falls. Per friend, he was told by PCP that weight loss was needed prior to undergoing knee replacement surgery, he has been working on weight loss and trying to make diet modifications.  Denies any specific weakness, speech or swallowing difficulties.  Denies new stroke/TIA symptoms.  Remains on aspirin and Plavix as well as atorvastatin, denies side effects Blood pressure today 140/74 Has since had follow-up with PCP He was evaluated by cardiology to discuss ILR, patient wished to further consider and to call office if he wishes to proceed.     PERTINENT IMAGING  Per hospitalization 05/11/2022 -05/13/2022 Code Stroke CT head No acute abnormality. ASPECTS 10.    CTA head & neck - advanced and widespread intracranial atherosclerosis: highly stenotic Right fetal PCA. moderate tandem stenoses of the contralateral Left PCA. mild bilateral ACA A1 stenosis, and moderate to severe stenosis. Left ACA A2 and right A3. up to moderate Right MCA M3/4 branch stenoses. mild stenoses of the Basilar artery, ICA siphons. MRI 2/8 No acute intracranial abnormality identified. 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 Repeat Head CT- No acute intracranial abnormality MRI 2/10 - Small acute bilateral parieto-occipital cortical infarcts.  2D Echo- EF 55-60% LE venous doppler no DVT  EEG- This study is suggestive of moderate diffuse encephalopathy, nonspecific etiology. No seizures LDL 56 HgbA1c 6.4 UDS neg  Per hospitalization 05/16/2022 -05/22/2022 CT head no acute abnormality MRI brain new acute infarct in left pons, redemonstrated subacute infarcts in the bilateral  occipital lobes and left parietal lobe     ROS:   14 system review of systems performed and negative with exception of those listed in HPI  PMH:  Past Medical History:  Diagnosis Date   Diabetes    Hypertension    Hypertensive urgency 03/04/2020   Stroke     PSH:  Past Surgical History:  Procedure Laterality Date   ANKLE FRACTURE SURGERY      Social History:  Social History   Socioeconomic History   Marital status: Divorced    Spouse name: Not on file   Number of children: 2   Years of education: Not on file   Highest education level: Not on file  Occupational History   Occupation: retired  Tobacco Use   Smoking status: Former    Types: Cigarettes    Quit date: 1973    Years since quitting: 51.2   Smokeless tobacco: Never  Vaping Use   Vaping Use: Never used  Substance and Sexual Activity   Alcohol use: Yes    Comment: Beer, Red wine   Drug use: Not Currently   Sexual activity: Not on file  Other Topics Concern   Not on file  Social History Narrative   Not on file   Social Determinants of Health   Financial Resource Strain: Not on file  Food Insecurity: No Food Insecurity (05/17/2022)   Hunger Vital Sign    Worried About Running Out of Food in the Last Year: Never true    Ran Out of Food in the Last Year: Never true  Transportation Needs: No Transportation Needs (05/17/2022)   PRAPARE - Hydrologist (Medical): No    Lack of Transportation (Non-Medical): No  Physical Activity: Not on file  Stress: Not on file  Social Connections: Not on file  Intimate Partner Violence: Not At Risk (05/17/2022)   Humiliation, Afraid, Rape, and Kick questionnaire    Fear of Current or Ex-Partner: No    Emotionally Abused: No    Physically Abused: No    Sexually Abused: No    Family History:  Family History  Problem Relation Age of Onset   Diabetes Father     Medications:   Current Outpatient Medications on File Prior to Visit   Medication Sig Dispense Refill   Accu-Chek Softclix Lancets lancets Use to check blood sugar twice daily 100 each 6   aspirin EC 81 MG tablet Take 1 tablet (81 mg total) by mouth daily. Swallow whole. (Patient taking differently: Take 81 mg by mouth daily.) 30 tablet 12   atorvastatin (LIPITOR) 20 MG tablet Take 1 tablet (20 mg total) by mouth daily. 30 tablet 0   Blood Glucose Monitoring Suppl (ACCU-CHEK GUIDE) w/Device KIT Use to check blood sugar twice daily 1 kit 0   Blood Pressure Monitoring (BLOOD PRESSURE KIT) DEVI Use to measure blood pressure 1 each 0   carvedilol (COREG) 25 MG tablet Take 1 tablet (25 mg total) by mouth 2 (two) times daily with a meal. 60 tablet 3   clopidogrel (PLAVIX) 75 MG tablet Take 1 tablet (75 mg total) by mouth daily. 90 tablet 0   dapagliflozin propanediol (FARXIGA) 10 MG TABS tablet Take 1 tablet (10 mg  total) by mouth once daily before breakfast. 90 tablet 0   famotidine (PEPCID) 20 MG tablet Take 1 tablet (20 mg total) by mouth daily. 30 tablet 0   furosemide (LASIX) 40 MG tablet Take 1 tablet (40 mg total) by mouth daily. 30 tablet 0   glucose blood (ACCU-CHEK GUIDE) test strip Use to check blood sugar twice daily 100 each 6   losartan (COZAAR) 100 MG tablet Take 1 tablet (100 mg total) by mouth once daily. 90 tablet 2   metFORMIN (GLUCOPHAGE) 500 MG tablet Take 1 tablet (500 mg total) by mouth 2 (two) times daily with a meal. 180 tablet 0   potassium chloride (KLOR-CON) 8 MEQ tablet Take 2 tablets (16 mEq total) by mouth 2 (two) times daily. 120 tablet 6   senna-docusate (SENOKOT-S) 8.6-50 MG tablet Take 2 tablets by mouth 2 (two) times daily. 60 tablet 0   spironolactone (ALDACTONE) 25 MG tablet Take 1 tablet (25 mg total) by mouth once daily. 90 tablet 0   thiamine (VITAMIN B-1) 100 MG tablet Take 1 tablet (100 mg total) by mouth daily. 30 tablet 0   traMADol (ULTRAM) 50 MG tablet Take 1 tablet (50 mg total) by mouth every 12 (twelve) hours as needed for  severe pain. 10 tablet 0   polyethylene glycol (MIRALAX / GLYCOLAX) 17 g packet Mix 17 g in 4-8 ounces of juice/water and take by mouth daily. (Patient not taking: Reported on 06/26/2022) 14 each 0   No current facility-administered medications on file prior to visit.    Allergies:   Allergies  Allergen Reactions   Codeine Itching   Latex Itching      OBJECTIVE:  Physical Exam  Vitals:   07/03/22 0953  BP: (!) 140/74  Pulse: 63  Weight: (!) 328 lb 9.6 oz (149.1 kg)  Height: 6\' 2"  (1.88 m)   Body mass index is 42.19 kg/m. No results found.     06/15/2022   10:57 AM  Depression screen PHQ 2/9  Decreased Interest 0  Down, Depressed, Hopeless 0  PHQ - 2 Score 0     General: well developed, well nourished, very pleasant elderly African-American male, seated, in no evident distress Head: head normocephalic and atraumatic.   Neck: supple with no carotid or supraclavicular bruits Cardiovascular: regular rate and rhythm, no murmurs Musculoskeletal: no deformity Skin:  no rash/petichiae Vascular:  Normal pulses all extremities   Neurologic Exam Mental Status: Awake and fully alert.  Fluent speech and language.  Oriented to place and time. Recent memory mildly impaired and remote memory intact. Attention span, concentration and fund of knowledge mostly appropriate during visit. Mood and affect appropriate.  Cranial Nerves: Fundoscopic exam reveals sharp disc margins. Pupils equal, briskly reactive to light. Extraocular movements full without nystagmus. Visual fields full to confrontation. Hearing intact. Facial sensation intact. Face, tongue, palate moves normally and symmetrically.  Motor: Normal bulk and tone. Normal strength in all tested extremity muscles Sensory.: intact to touch , pinprick , position and vibratory sensation.  Coordination: Rapid alternating movements normal in all extremities. Finger-to-nose and heel-to-shin performed accurately bilaterally. Gait and  Station: Arises from chair with moderate difficulty. Stance is normal. Gait demonstrates slow cautious gait with mild unsteadiness and use of rolling walker.  Tandem walk and heel toe not attempted. Reflexes: 1+ and symmetric. Toes downgoing.     NIHSS  0 Modified Rankin  0      ASSESSMENT: Austin Mercer. is a 72 y.o. 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 Vascular risk factors include HTN, HLD, DM, CHF, obesity, and at risk for sleep apnea.      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  Continue working with home health therapies.  Advised to follow-up with PCP regarding chronic right knee pain which may be contributing to gait difficulty.  Discussion regarding ILR placement and indication, he is not interested in pursuing at this time, discussed risk vs benefit and he verbalized understanding, he is aware to contact cardiology if he wishes to proceed Continue aspirin 81mg  daily and Plavix for total of 3 months duration and then aspirin alone (around 08/14/2022) and continue atorvastatin (Lipitor) for secondary stroke prevention.   Discussed secondary stroke prevention measures and importance of close PCP follow up for aggressive stroke risk factor management including BP goal<130/90, HLD with LDL goal<70 and DM with A1c.<7 .  Stroke labs 05/2022: LDL 56, A1c 6.4 I have gone over the pathophysiology of stroke, warning signs and symptoms, risk factors and their management in some detail with instructions to go to the closest emergency room for symptoms of concern. At risk for sleep apnea: Declines interest in pursuing sleep study at this time.  Discussed risk of untreated sleep apnea.  Advised to call office if he wishes to proceed in the future.    Overall stable from stroke  standpoint without further recommendations and recommend follow-up on an as-needed basis.   CC:  [facility] provider: Dr. Leonie Man PCP: [name], MD    I spent 56 minutes of face-to-face and non-face-to-face time with patient and friend.  This included previsit chart review including review of recent hospitalization, lab review, study review, order entry, electronic health record documentation, patient and friend education and discussion regarding above diagnoses and treatment plan and answered all other questions to patient and friend satisfaction  Frann Rider, AGNP-BC  [facility] [address] [place], [place] [address]  Phone [phone] Fax [phone] Note: This document was prepared with digital dictation and possible smart phrase technology. Any transcriptional errors that result from this process are unintentional.

## 2022-06-30 ENCOUNTER — Telehealth (INDEPENDENT_AMBULATORY_CARE_PROVIDER_SITE_OTHER): Payer: Self-pay | Admitting: Critical Care Medicine

## 2022-06-30 NOTE — Telephone Encounter (Signed)
Copied from [place] [id number]. Topic: Transportation - Transportation >> Jun 30, 2022  9:39 AM Erskine Squibb wrote: Reason for CRM: Ms Shorter the patients friend and contact called in stating she received transportation forms in the mail today but they are not fully filled out. She states Part D was incomplete. She requested to speak with Cassandra as she helped her in the past and states the forms need to be turned in by April 5th. Please assist further

## 2022-07-03 ENCOUNTER — Telehealth: Payer: Self-pay | Admitting: Critical Care Medicine

## 2022-07-03 ENCOUNTER — Encounter: Payer: Self-pay | Admitting: Adult Health

## 2022-07-03 ENCOUNTER — Other Ambulatory Visit: Payer: Self-pay

## 2022-07-03 ENCOUNTER — Ambulatory Visit: Payer: Medicare PPO | Admitting: Adult Health

## 2022-07-03 VITALS — BP 140/74 | HR 63 | Ht 74.0 in | Wt 328.6 lb

## 2022-07-03 DIAGNOSIS — I639 Cerebral infarction, unspecified: Secondary | ICD-10-CM

## 2022-07-03 NOTE — Telephone Encounter (Signed)
Copied from [place]. Topic: General - Other >> Jul 03, 2022 11:33 AM Leone Payor F wrote: Reason for CRM: Pt is calling in requesting to speak with Cassandra regarding transportation paperwork. Please follow up with Pt.

## 2022-07-03 NOTE — Patient Instructions (Addendum)
Continue working with physical therapy, if knee pain persists, would recommend further discussion with primary doctor   Would highly recommend pursuing loop recorder placement to further evaluate for an irregular heart rhythm such as atrial fibrillation which could have been the cause of your stroke - please contact cardiology if you wish to proceed  Continue aspirin 81 mg daily and clopidogrel 75 mg daily until 5/13 and then aspirin alone and can stop Plavix (clopidogrel) and continue atorvastatin 20 mg daily for secondary stroke prevention  Continue to follow up with PCP regarding blood pressure, diabetes and cholesterol management  Maintain strict control of hypertension with blood pressure goal below 130/90, diabetes with hemoglobin A1c goal below 7.0 % and cholesterol with LDL cholesterol (bad cholesterol) goal below 70 mg/dL.   Signs of a Stroke? Follow the BEFAST method:  Balance Watch for a sudden loss of balance, trouble with coordination or vertigo Eyes Is there a sudden loss of vision in one or both eyes? Or double vision?  Face: Ask the person to smile. Does one side of the face droop or is it numb?  Arms: Ask the person to raise both arms. Does one arm drift downward? Is there weakness or numbness of a leg? Speech: Ask the person to repeat a simple phrase. Does the speech sound slurred/strange? Is the person confused ? Time: If you observe any of these signs, call 911.      Thank you for coming to see [place] at [facility]. I hope we have been able to provide you high quality care today.  You may receive a patient satisfaction survey over the next few weeks. We would appreciate your feedback and comments so that we may continue to improve ourselves and the health of our patients.    Stroke Prevention Some medical conditions and lifestyle choices can lead to a higher risk for a stroke. You can help to prevent a stroke by eating healthy foods and exercising. It  also helps to not smoke and to manage any health problems you may have. How can this condition affect me? A stroke is an emergency. It should be treated right away. A stroke can lead to brain damage or threaten your life. There is a better chance of surviving and getting better after a stroke if you get medical help right away. What can increase my risk? The following medical conditions may increase your risk of a stroke: Diseases of the heart and blood vessels (cardiovascular disease). High blood pressure (hypertension). Diabetes. High cholesterol. Sickle cell disease. Problems with blood clotting. Being very overweight. Sleeping problems (obstructivesleep apnea). Other risk factors include: Being older than age 49. A history of blood clots, stroke, or mini-stroke (TIA). Race, ethnic background, or a family history of stroke. Smoking or using tobacco products. Taking birth control pills, especially if you smoke. Heavy alcohol and drug use. Not being active. What actions can I take to prevent this? Manage your health conditions High cholesterol. Eat a healthy diet. If this is not enough to manage your cholesterol, you may need to take medicines. Take medicines as told by your doctor. High blood pressure. Try to keep your blood pressure below 130/80. If your blood pressure cannot be managed through a healthy diet and regular exercise, you may need to take medicines. Take medicines as told by your doctor. Ask your doctor if you should check your blood pressure at home. Have your blood pressure checked every year. Diabetes. Eat a healthy diet and get regular  exercise. If your blood sugar (glucose) cannot be managed through diet and exercise, you may need to take medicines. Take medicines as told by your doctor. Talk to your doctor about getting checked for sleeping problems. Signs of a problem can include: Snoring a lot. Feeling very tired. Make sure that you manage any other  conditions you have. Nutrition  Follow instructions from your doctor about what to eat or drink. You may be told to: Eat and drink fewer calories each day. Limit how much salt (sodium) you use to 1,500 milligrams (mg) each day. Use only healthy fats for cooking, such as olive oil, canola oil, and sunflower oil. Eat healthy foods. To do this: Choose foods that are high in fiber. These include whole grains, and fresh fruits and vegetables. Eat at least 5 servings of fruits and vegetables a day. Try to fill one-half of your plate with fruits and vegetables at each meal. Choose low-fat (lean) proteins. These include low-fat cuts of meat, chicken without skin, fish, tofu, beans, and nuts. Eat low-fat dairy products. Avoid foods that: Are high in salt. Have saturated fat. Have trans fat. Have cholesterol. Are processed or pre-made. Count how many carbohydrates you eat and drink each day. Lifestyle If you drink alcohol: Limit how much you have to: 0-1 drink a day for women who are not pregnant. 0-2 drinks a day for men. Know how much alcohol is in your drink. In the [place] one drink equals one 12 oz bottle of beer (37m), one 5 oz glass of wine (1434m, or one 1 oz glass of hard liquor (4465m Do not smoke or use any products that have nicotine or tobacco. If you need help quitting, ask your doctor. Avoid secondhand smoke. Do not use drugs. Activity  Try to stay at a healthy weight. Get at least 30 minutes of exercise on most days, such as: Fast walking. Biking. Swimming. Medicines Take over-the-counter and prescription medicines only as told by your doctor. Avoid taking birth control pills. Talk to your doctor about the risks of taking birth control pills if: You are over 35 70ars old. You smoke. You get very bad headaches. You have had a blood clot. Where to find more information American Stroke Association: www.strokeassociation.org Get help right away if: You or a loved one  has any signs of a stroke. "BE FAST" is an easy way to remember the warning signs: B - Balance. Dizziness, sudden trouble walking, or loss of balance. E - Eyes. Trouble seeing or a change in how you see. F - Face. Sudden weakness or loss of feeling of the face. The face or eyelid may droop on one side. A - Arms. Weakness or loss of feeling in an arm. This happens all of a sudden and most often on one side of the body. S - Speech. Sudden trouble speaking, slurred speech, or trouble understanding what people say. T - Time. Time to call emergency services. Write down what time symptoms started. You or a loved one has other signs of a stroke, such as: A sudden, very bad headache with no known cause. Feeling like you may vomit (nausea). Vomiting. A seizure. These symptoms may be an emergency. Get help right away. Call your local emergency services (911 in the [place] Do not wait to see if the symptoms will go away. Do not drive yourself to the hospital. Summary You can help to prevent a stroke by eating healthy, exercising, and not smoking. It also helps to manage any health  problems you have. Do not smoke or use any products that contain nicotine or tobacco. Get help right away if you or a loved one has any signs of a stroke. This information is not intended to replace advice given to you by your health care provider. Make sure you discuss any questions you have with your health care provider. Document Revised: 10/02/2019 Document Reviewed: 10/20/2019 Elsevier Patient Education  [place].

## 2022-07-03 NOTE — Telephone Encounter (Signed)
Part B of the [employer] form refaxed.

## 2022-07-03 NOTE — Telephone Encounter (Signed)
Form emailed to [employer] at [employer].

## 2022-07-03 NOTE — Progress Notes (Signed)
I agree with the above plan 

## 2022-07-04 ENCOUNTER — Telehealth: Payer: Self-pay | Admitting: Critical Care Medicine

## 2022-07-04 ENCOUNTER — Telehealth: Payer: Self-pay

## 2022-07-04 ENCOUNTER — Ambulatory Visit: Payer: Self-pay

## 2022-07-04 ENCOUNTER — Other Ambulatory Visit: Payer: Self-pay | Admitting: Pharmacist

## 2022-07-04 ENCOUNTER — Telehealth: Payer: Self-pay | Admitting: Adult Health

## 2022-07-04 DIAGNOSIS — I110 Hypertensive heart disease with heart failure: Secondary | ICD-10-CM | POA: Diagnosis not present

## 2022-07-04 DIAGNOSIS — M6281 Muscle weakness (generalized): Secondary | ICD-10-CM | POA: Diagnosis not present

## 2022-07-04 DIAGNOSIS — E1122 Type 2 diabetes mellitus with diabetic chronic kidney disease: Secondary | ICD-10-CM | POA: Diagnosis not present

## 2022-07-04 DIAGNOSIS — I5022 Chronic systolic (congestive) heart failure: Secondary | ICD-10-CM | POA: Diagnosis not present

## 2022-07-04 DIAGNOSIS — K219 Gastro-esophageal reflux disease without esophagitis: Secondary | ICD-10-CM | POA: Diagnosis not present

## 2022-07-04 DIAGNOSIS — N183 Chronic kidney disease, stage 3 unspecified: Secondary | ICD-10-CM | POA: Diagnosis not present

## 2022-07-04 DIAGNOSIS — I69398 Other sequelae of cerebral infarction: Secondary | ICD-10-CM | POA: Diagnosis not present

## 2022-07-04 DIAGNOSIS — E785 Hyperlipidemia, unspecified: Secondary | ICD-10-CM | POA: Diagnosis not present

## 2022-07-04 DIAGNOSIS — K7460 Unspecified cirrhosis of liver: Secondary | ICD-10-CM | POA: Diagnosis not present

## 2022-07-04 NOTE — Telephone Encounter (Signed)
I do not see anything concerning in his notes, regarding stroke, that would prevent patient from driving, do you agree or  do you have any concerns?

## 2022-07-04 NOTE — Patient Outreach (Signed)
  Care Coordination   Follow Up Visit Note   07/04/2022 Name: Austin Mercer. MRN: [id number] DOB: 1950-10-07  Austin Mercer. is a 72 y.o. year old male who sees Austin Stain, MD for primary care. I spoke with  Austin Mercer. by phone today.  What matters to the patients health and wellness today?  Patient's sister will schedule patient to have his right knee evaluated by [facility] in [place]. She will ask his Neurologist if patient is cleared to resume driving.     Goals Addressed             This Visit's Progress    To get some help with my right knee pain       Care Coordination Interventions: Received inbound call from sister Austin Mercer requesting the contact number and address for [facility] in [place] and this information was provided Discussed sister Austin Mercer plans to call this provider today to schedule an appointment to have patient's knee pain evaluated Answered questions regarding patient's ability to resume driving himself Determined patient completed a face to face visit with Austin Rider NP with [facility] on 07/03/22 Instructed sister Austin Mercer to contact this provider in order to discuss recommendations for patient being able to resume driving himself       Interventions Today    Flowsheet Row Most Recent Value  Chronic Disease   Chronic disease during today's visit Other  [right knee pain]  General Interventions   General Interventions Discussed/Reviewed General Interventions Discussed, General Interventions Reviewed, Doctor Visits  Doctor Visits Discussed/Reviewed Doctor Visits Reviewed, Doctor Visits Discussed, Specialist  Education Interventions   Education Provided Provided Education          SDOH assessments and interventions completed:  No     Care Coordination Interventions:  Yes, provided   Follow up plan: Follow up call scheduled for 07/11/22 @09 :30 AM    Encounter Outcome:  Pt. Visit Completed

## 2022-07-04 NOTE — Telephone Encounter (Signed)
Called patient and left voicemail.

## 2022-07-04 NOTE — Telephone Encounter (Signed)
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 Would recommend he have formal driving evaluation prior to return to driving.

## 2022-07-04 NOTE — Patient Instructions (Signed)
Visit Information  Thank you for taking time to visit with me today. Please don't hesitate to contact me if I can be of assistance to you.   Following are the goals we discussed today:   Goals Addressed             This Visit's Progress    To get some help with my right knee pain       Care Coordination Interventions: Received inbound call from sister [name] requesting the contact number and address for [facility] in [place] and this information was provided Discussed sister [name] plans to call this provider today to schedule an appointment to have patient's knee pain evaluated Answered questions regarding patient's ability to resume driving himself Determined patient completed a face to face visit with Frann Rider NP with [facility] on 07/03/22 Instructed sister [name] to contact this provider in order to discuss recommendations for patient being able to resume driving himself          Our next appointment is by telephone on 07/11/22 at 09:30 AM  Please call the care guide team at [phone] if you need to cancel or reschedule your appointment.   If you are experiencing a Mental Health or [place] or need someone to talk to, please call 1-800-273-TALK (toll free, 24 hour hotline) go to [facility] [phone])  The patient verbalized understanding of instructions, educational materials, and care plan provided today and DECLINED offer to receive copy of patient instructions, educational materials, and care plan.   Barb Merino, RN, BSN, CCM Care Management Coordinator [facility] Care Management Direct Phone: [phone]

## 2022-07-04 NOTE — Telephone Encounter (Signed)
Patient and girlfriends called back, spoke with patient and got him scheduled for 4/18 at 15   Cassandra they request that I send you a message so you can set transportation up

## 2022-07-04 NOTE — Telephone Encounter (Unsigned)
Copied from [place] [id number]. Topic: General - Other >> Jul 04, 2022  3:59 PM Dominique A wrote: Reason for CRM: Ms. Andres Shad states that she is returning a missed call to [facility] regarding pt. Please have Cassandra call Ms. Shorter back.

## 2022-07-04 NOTE — Telephone Encounter (Signed)
Called and left voice mail

## 2022-07-04 NOTE — Telephone Encounter (Signed)
Contacted pt sister back, advised her of NP recommendations. She verbally understood and was appreciative.

## 2022-07-04 NOTE — Patient Outreach (Addendum)
  Care Coordination    07/04/2022 Name: Austin Mercer. MRN: [id number] DOB: [birth year]  Austin Mercer. is a 72 y.o. year old male who sees Storm Frisk, MD for primary care. I  received an inbound call from Elsie Lincoln, RN with patients primary care providers office. Determined the patients friend [name] had contacted the clinic to request assistance with transportation to an upcomming appointment as well as caregiver assistance in the home. Performed chart review to note patients sister has also been in contact with RN Care Manager Lawanna Kobus Little regarding the desire for patient to resume driving. Attempted to contact the patient, voice message left. Collaboration with RN Care Manager Angel Little via in-basket message to request feedback on patient care coordination needs.  SDOH assessments and interventions completed:  No     Care Coordination Interventions:  Yes, provided   Interventions Today    Flowsheet Row Most Recent Value  Chronic Disease   Chronic disease during today's visit Hypertension (HTN), Diabetes  General Interventions   General Interventions Discussed/Reviewed Communication with  Communication with PCP/Specialists, RN        Follow up plan:  SW will attempt to contact the patient again over the next day    Encounter Outcome:  Pt. Visit Completed   Bevelyn Ngo, Kenard Gower, CDP Social Worker, Certified Dementia Practitioner [facility] Care Management  Care Coordination [phone]

## 2022-07-04 NOTE — Progress Notes (Signed)
Patient outreached by Toma Aran, PharmD Candidate on 07/04/2022 to discuss hypertension.   Patient does not have an automated home blood pressure machine.   Medication review was performed. They are taking medications as prescribed. They reported no differences from their prescribed list   The following barriers to adherence were noted:  - They do not have cost concerns.  - They do not have transportation concerns.  - They do not need assistance obtaining refills.  - They do not occasionally forget to take some of their prescribed medications.  - They do not feel like one/some of their medications make them feel poorly.  - They do not have questions or concerns about their medications.  - They do have follow up scheduled with their primary care provider/cardiologist.   The following interventions were completed:  - Medications were reviewed  - Patient was educated on goal blood pressures and long term health implications of elevated blood pressure  - Patient was educated on how to access home blood pressure machine   The patient has follow up scheduled: 09/20/2022  PCP: Maryln Manuel, PharmD Candidate    Will coordinate with case management to pursue access to a home BP monitor.    Catie Hedwig Morton, PharmD, [place], [facility] [phone]

## 2022-07-04 NOTE — Telephone Encounter (Unsigned)
Copied from [place] [phone]. Topic: General - Other >> Jul 04, 2022  4:20 PM Dominique A wrote: Reason for CRM: Ms. Andres Shad is calling to see if the referral for pt knee can just be sent in since he was already seen on 06/15/22. Pt is scheduled for a visit on 07/20/22 regarding his knee pain, but Ms. Shorter is wanting to see if this appt can be canceled or if he has to come to the office. Please advise.

## 2022-07-04 NOTE — Telephone Encounter (Signed)
Pt's sister [name] called wanting to discuss with MD or RN if the pt is capable to drive. Please advise.

## 2022-07-04 NOTE — Telephone Encounter (Signed)
Will have to be seen either with Levada Dy or with me sooner than June

## 2022-07-04 NOTE — Telephone Encounter (Signed)
Copied from [place] [phone]. Topic: Referral - Request for Referral >> Jul 04, 2022  9:18 AM Leone Payor F wrote: Has patient seen PCP for this complaint? Yes.   *If NO, is insurance requiring patient see PCP for this issue before PCP can refer them? Referral for which specialty: Orthopedic Preferred provider/office: Any Reason for referral: Pt's right knee is giving him problems due to his weight.

## 2022-07-05 ENCOUNTER — Other Ambulatory Visit: Payer: Self-pay

## 2022-07-05 ENCOUNTER — Ambulatory Visit: Payer: Self-pay

## 2022-07-05 ENCOUNTER — Encounter: Payer: Self-pay | Admitting: Licensed Clinical Social Worker

## 2022-07-05 DIAGNOSIS — M25561 Pain in right knee: Secondary | ICD-10-CM

## 2022-07-05 DIAGNOSIS — I10 Essential (primary) hypertension: Secondary | ICD-10-CM

## 2022-07-05 NOTE — Patient Outreach (Signed)
  Care Coordination   Multidisciplinary Case Review Note    07/05/2022 Name: Austin Mercer. MRN: [id number] DOB: [birth year]  Austin Mercer. is a 72 y.o. year old male who sees [name], MD for primary care.  The  multidisciplinary care team met today to review patient care needs and barriers.     Goals Addressed             This Visit's Progress    COMPLETED: Care Coordination Activities       Care Coordination Interventions: Multidisciplinary case discussion to review patient care coordination needs Discussed plan for patient to engage with LCSW Christa See to address ongoing social support needs Referral placed to community resource care guide team to assist with transportation to provider appointment 4/18 Collaboration with primary care providers office to advise of interventions and plan         SDOH assessments and interventions completed:  No     Care Coordination Interventions Activated:  Yes   Care Coordination Interventions:  Yes, provided   Interventions Today    Flowsheet Row Most Recent Value  Chronic Disease   Chronic disease during today's visit Diabetes, Hypertension (HTN)  General Interventions   General Interventions Discussed/Reviewed Communication with, Management consultant with RN, Social Work        Follow up plan: Referral made to [employer] care guide team Patient will be contacted by [place]    Multidisciplinary Team Attendees:   Christa See, LCSW Barb Merino, RN [address] RN [place], [place]  Daneen Schick, [place], CDP Social Worker, Certified Dementia Practitioner [place] Coordination [phone]

## 2022-07-05 NOTE — Patient Outreach (Signed)
  Care Coordination   Follow Up/Collaboration Visit Note   07/05/2022 Name: Alvar Huscher. MRN: [id number] DOB: [birth year]  Abb Vershawn Neloms. is a 72 y.o. year old male who sees [name], MD for primary care. I  contacted Loma Sousa with [employer] [place]  What matters to the patients health and wellness today?   Patient was not engaged during this encounter     SDOH assessments and interventions completed:  No     Care Coordination Interventions:  Yes, provided  Interventions Today    Flowsheet Row Most Recent Value  Chronic Disease   Chronic disease during today's visit Hypertension (HTN), Diabetes, Other  [MDD]  General Interventions   General Interventions Discussed/Reviewed Communication with  Communication with --  Andreas [place]-LCSW submitted request following up on pt's application to asssist with transportation barriers]       Follow up plan: Follow up call scheduled for within a week    Encounter Outcome:  Pt. Visit Completed   Christa See, MSW, [email or web address] Phone [phone] 5:06 PM

## 2022-07-05 NOTE — Telephone Encounter (Signed)
Noted  

## 2022-07-06 ENCOUNTER — Telehealth: Payer: Self-pay | Admitting: *Deleted

## 2022-07-06 ENCOUNTER — Telehealth: Payer: Self-pay | Admitting: Licensed Clinical Social Worker

## 2022-07-06 NOTE — Telephone Encounter (Signed)
   Telephone encounter was:  Unsuccessful.  07/06/2022 Name: Austin Mercer. MRN: [id number] DOB: 06-07-1950  Unsuccessful outbound call made today to assist with:  Transportation Needs   Outreach Attempt:  1st Attempt  No answer  [facility] [address] , [place] [address] Email : [name]

## 2022-07-06 NOTE — Telephone Encounter (Signed)
Telephone encounter was:  Successful.  07/06/2022 Name: Austin Mercer. MRN: [id number] DOB: [birth year]  Austin Mercer. is a 72 y.o. year old male who is a primary care patient of Austin Stain, MD . The community resource team was consulted for assistance with Transportation Needs   Care guide performed the following interventions: Patient provided with information about care guide support team and interviewed to confirm resource needs. Patient ( friend) needed to book transportation , patient has completed [facility] access transportation application but has not any outreach yet , [employer] does not offer benefits to patient refered to follow up with va benefit scheduled for transport on 07/13/2022 provided transportation  line # [phone] Follow Up Plan:  No further follow up planned at this time. The patient has been provided with needed resources.  Austin Mercer [phone] [address] , [place] [address] Email : [email or web address]       Austin Mercer. DOB: [birth year] MRN: [id number]   Austin Mercer  For purposes of improving physical access to our facilities, Austin Mercer is pleased to partner with third parties to provide [place] patients or other authorized individuals the option of convenient, on-demand ground transportation services (the [place]") through use of the technology service that enables users to request on-demand ground transportation from independent third-party providers.  By opting to use and accept these [employer], I, the undersigned, hereby agree on behalf of myself, and on behalf of any minor child using the Government social research officer for whom I am the parent or legal guardian, as follows:  Government social research officer provided to me are provided by independent third-party transportation providers who are not Austin Mercer or employees and who  are unaffiliated with Austin Mercer. [place] is neither a transportation carrier nor a common or public carrier. Austin Mercer has no control over the quality or safety of the transportation that occurs as a result of the [employer]. Austin Mercer cannot guarantee that any third-party transportation provider will complete any arranged transportation service. Austin Mercer makes no representation, warranty, or guarantee regarding the reliability, timeliness, quality, safety, suitability, or availability of any of the Transport Services or that they will be error free. I fully understand that traveling by vehicle involves risks and dangers of serious bodily injury, including permanent disability, paralysis, and death. I agree, on behalf of myself and on behalf of any minor child using the Transport Services for whom I am the parent or legal guardian, that the entire risk arising out of my use of the [employer] remains solely with me, to the maximum extent permitted under applicable law. The [employer] are provided "as is" and "as available." [name] disclaims all representations and warranties, express, implied or statutory, not expressly set out in these terms, including the implied warranties of merchantability and fitness for a particular purpose. I hereby waive and release Austin Mercer, its agents, employees, officers, directors, representatives, insurers, attorneys, assigns, successors, subsidiaries, and affiliates from any and all past, present, or future claims, demands, liabilities, actions, causes of action, or suits of any kind directly or indirectly arising from acceptance and use of the [employer]. I further waive and release  and its affiliates from all present and future Mercer and responsibility for any injury or death to persons or damages to property caused by or related to the use of the [employer]. I have read this Waiver  and Release of  Mercer, and I understand the terms used in it and their legal significance. This Waiver is freely and voluntarily given with the understanding that my right (as well as the right of any minor child for whom I am the parent or legal guardian using the [employer]) to legal recourse against [place] in connection with the [employer] is knowingly surrendered in return for use of these services.   I attest that I read the consent document to Austin Mercer., gave Austin Mercer the opportunity to ask questions and answered the questions asked (if any). I affirm that Austin Mercer. then provided consent for he's participation in this program.     Austin Mercer

## 2022-07-06 NOTE — Patient Outreach (Signed)
  Care Coordination   Initial Visit Note   07/06/2022 Name: Austin Mercer. MRN: [id number] DOB: 1951-01-12  Austin Mercer. is a 72 y.o. year old male who sees [name], MD for primary care. I spoke with  Loretta Plume Jr.'s friend by phone today.  What matters to the patients health and wellness today?  Symptom Management and Transportation    Goals Addressed             This Visit's Progress    Obtain Supportive Resources-Transportation/Housing/Caregiver Fatigue   On track    Activities and task to complete in order to accomplish goals.   Keep all upcoming appointments discussed today Continue with compliance of taking medication prescribed by Doctor Implement healthy coping skills discussed to assist with management of symptoms Continue working with [facility] care team to assist with goals identified         SDOH assessments and interventions completed:  No     Care Coordination Interventions:  Yes, provided  Interventions Today    Flowsheet Row Most Recent Value  Chronic Disease   Chronic disease during today's visit Hypertension (HTN), Diabetes, Other  [MDD]  General Interventions   General Interventions Discussed/Reviewed General Interventions Discussed, Communication with  [LCSW introduced self and explained role in care coordination. Reviewed upcoming appts and discussed any barriers to care. Transportation and support system discussed]  Communication with Social Work, [place] Discussed, Coping Strategies  [Caregiver stress discussed. Validation and Encouragement provided. De-escalation strategies discussed.]       Follow up plan: Follow up call scheduled for 2 weeks    Encounter Outcome:  Pt. Visit Completed   Christa See, MSW, [email or web address] Phone [phone] 2:09 PM

## 2022-07-06 NOTE — Patient Instructions (Signed)
Visit Information  Thank you for taking time to visit with me today. Please don't hesitate to contact me if I can be of assistance to you.   Following are the goals we discussed today:   Goals Addressed             This Visit's Progress    Obtain Supportive Resources-Transportation/Housing/Caregiver Fatigue   On track    Activities and task to complete in order to accomplish goals.   Keep all upcoming appointments discussed today Continue with compliance of taking medication prescribed by Doctor Implement healthy coping skills discussed to assist with management of symptoms Continue working with [facility] care team to assist with goals identified         Our next appointment is by telephone on 4/16 at 11 AM  Please call the care guide team at [phone] if you need to cancel or reschedule your appointment.   If you are experiencing a Mental Health or [place] or need someone to talk to, please call the Suicide and Crisis Lifeline: 988 call 911   The patient verbalized understanding of instructions, educational materials, and care plan provided today and DECLINED offer to receive copy of patient instructions, educational materials, and care plan.   Christa See, MSW, [email or web address] Phone [phone] 2:10 PM

## 2022-07-07 ENCOUNTER — Telehealth: Payer: Self-pay | Admitting: Critical Care Medicine

## 2022-07-07 NOTE — Telephone Encounter (Signed)
Contacted Austin Mercer. to schedule their annual wellness visit. Appointment made for 07/13/22.  [name] AWV direct phone # [phone]

## 2022-07-07 NOTE — Telephone Encounter (Signed)
Spoke with patient's caregiver. Advised to keep appointment with A. Mcclung on 07/20/2022. [place] referral can be discussed during the office visit with provider. Transportation has been arranged and patient is aware.

## 2022-07-07 NOTE — Telephone Encounter (Signed)
Please see encounter from 07/07/2022

## 2022-07-10 ENCOUNTER — Telehealth: Payer: Self-pay

## 2022-07-10 DIAGNOSIS — N183 Chronic kidney disease, stage 3 unspecified: Secondary | ICD-10-CM | POA: Diagnosis not present

## 2022-07-10 DIAGNOSIS — E1122 Type 2 diabetes mellitus with diabetic chronic kidney disease: Secondary | ICD-10-CM | POA: Diagnosis not present

## 2022-07-10 DIAGNOSIS — K7460 Unspecified cirrhosis of liver: Secondary | ICD-10-CM | POA: Diagnosis not present

## 2022-07-10 DIAGNOSIS — K219 Gastro-esophageal reflux disease without esophagitis: Secondary | ICD-10-CM | POA: Diagnosis not present

## 2022-07-10 DIAGNOSIS — E785 Hyperlipidemia, unspecified: Secondary | ICD-10-CM | POA: Diagnosis not present

## 2022-07-10 DIAGNOSIS — M6281 Muscle weakness (generalized): Secondary | ICD-10-CM | POA: Diagnosis not present

## 2022-07-10 DIAGNOSIS — I69398 Other sequelae of cerebral infarction: Secondary | ICD-10-CM | POA: Diagnosis not present

## 2022-07-10 DIAGNOSIS — I5022 Chronic systolic (congestive) heart failure: Secondary | ICD-10-CM | POA: Diagnosis not present

## 2022-07-10 DIAGNOSIS — I110 Hypertensive heart disease with heart failure: Secondary | ICD-10-CM | POA: Diagnosis not present

## 2022-07-10 NOTE — Telephone Encounter (Signed)
Ms. Avel Sensor called in about patient, No DPR on file all information was received no information was given. She stated that the referral for ortho for patient was supposed to be cancelled. She stated that Cassandra, RN was supposed to do it information handed over to her.

## 2022-07-10 NOTE — Telephone Encounter (Signed)
Patient spoke with Elonda Husky, RN about this matter

## 2022-07-11 DIAGNOSIS — N183 Chronic kidney disease, stage 3 unspecified: Secondary | ICD-10-CM | POA: Diagnosis not present

## 2022-07-11 DIAGNOSIS — I5022 Chronic systolic (congestive) heart failure: Secondary | ICD-10-CM | POA: Diagnosis not present

## 2022-07-11 DIAGNOSIS — E1122 Type 2 diabetes mellitus with diabetic chronic kidney disease: Secondary | ICD-10-CM | POA: Diagnosis not present

## 2022-07-11 DIAGNOSIS — E785 Hyperlipidemia, unspecified: Secondary | ICD-10-CM | POA: Diagnosis not present

## 2022-07-11 DIAGNOSIS — M6281 Muscle weakness (generalized): Secondary | ICD-10-CM | POA: Diagnosis not present

## 2022-07-11 DIAGNOSIS — I110 Hypertensive heart disease with heart failure: Secondary | ICD-10-CM | POA: Diagnosis not present

## 2022-07-11 DIAGNOSIS — I69398 Other sequelae of cerebral infarction: Secondary | ICD-10-CM | POA: Diagnosis not present

## 2022-07-11 DIAGNOSIS — K7460 Unspecified cirrhosis of liver: Secondary | ICD-10-CM | POA: Diagnosis not present

## 2022-07-11 DIAGNOSIS — K219 Gastro-esophageal reflux disease without esophagitis: Secondary | ICD-10-CM | POA: Diagnosis not present

## 2022-07-12 DIAGNOSIS — I5022 Chronic systolic (congestive) heart failure: Secondary | ICD-10-CM | POA: Diagnosis not present

## 2022-07-12 DIAGNOSIS — I110 Hypertensive heart disease with heart failure: Secondary | ICD-10-CM | POA: Diagnosis not present

## 2022-07-12 DIAGNOSIS — K7460 Unspecified cirrhosis of liver: Secondary | ICD-10-CM | POA: Diagnosis not present

## 2022-07-12 DIAGNOSIS — M6281 Muscle weakness (generalized): Secondary | ICD-10-CM | POA: Diagnosis not present

## 2022-07-12 DIAGNOSIS — E1122 Type 2 diabetes mellitus with diabetic chronic kidney disease: Secondary | ICD-10-CM | POA: Diagnosis not present

## 2022-07-12 DIAGNOSIS — N183 Chronic kidney disease, stage 3 unspecified: Secondary | ICD-10-CM | POA: Diagnosis not present

## 2022-07-12 DIAGNOSIS — I69398 Other sequelae of cerebral infarction: Secondary | ICD-10-CM | POA: Diagnosis not present

## 2022-07-12 DIAGNOSIS — E785 Hyperlipidemia, unspecified: Secondary | ICD-10-CM | POA: Diagnosis not present

## 2022-07-12 DIAGNOSIS — K219 Gastro-esophageal reflux disease without esophagitis: Secondary | ICD-10-CM | POA: Diagnosis not present

## 2022-07-13 ENCOUNTER — Ambulatory Visit: Payer: No Typology Code available for payment source | Admitting: Orthopaedic Surgery

## 2022-07-13 ENCOUNTER — Ambulatory Visit: Payer: No Typology Code available for payment source | Attending: Critical Care Medicine

## 2022-07-13 VITALS — Ht 74.0 in | Wt 337.0 lb

## 2022-07-13 DIAGNOSIS — Z0000 Encounter for general adult medical examination without abnormal findings: Secondary | ICD-10-CM | POA: Diagnosis not present

## 2022-07-13 NOTE — Patient Instructions (Signed)
Mr. Austin Mercer , Thank you for taking time to come for your Medicare Wellness Visit. I appreciate your ongoing commitment to your health goals. Please review the following plan we discussed and let me know if I can assist you in the future.   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   Keep all upcoming appointments discussed today Continue with compliance of taking medication prescribed by Doctor Implement healthy coping skills discussed to assist with management of symptoms Continue working with Austin Mercer care team to assist with goals identified      Patient Stated     07/13/2022, wants to lose weight     To get some help with my right knee pain     Care Coordination Interventions: Received inbound call from sister Austin Mercer requesting the contact number and address for [facility] [place] and this information was provided Discussed sister Austin KocherRegina plans to call this provider today to schedule an appointment to have patient's knee pain evaluated Answered questions regarding patient's ability to resume driving himself Determined patient completed a face to face visit with Austin AustinJessica McCue NP with  [facility] on 07/03/22 Instructed sister Austin KocherRegina to contact this provider in order to discuss recommendations for patient being able to resume driving himself          This is a list of the screening recommended for you and due dates:  Health Maintenance  Topic Date Due   Zoster (Shingles) Vaccine (2 of 2) 10/24/2017   Eye exam for diabetics  05/18/2022   COVID-19 Vaccine (5 - 2023-24 season) 06/15/2022   Complete foot exam   09/14/2022   Yearly kidney health urinalysis for diabetes  10/19/2022   Flu Shot  11/02/2022   Hemoglobin A1C  11/17/2022   Yearly kidney function blood test for diabetes  06/22/2023   Medicare Annual Wellness Visit  07/13/2023   Colon Cancer Screening  09/06/2027   DTaP/Tdap/Td vaccine (3 - Td or Tdap) 04/21/2030   Pneumonia Vaccine  Completed   HPV Vaccine  Aged Out    Advanced directives: Advance directive discussed with you today. .  Conditions/risks identified: none  Next appointment: Follow up in one year for your annual wellness visit.   Preventive Care 6365 Years and Older, Male  Preventive care refers to lifestyle choices and visits with your health care provider that can promote health and wellness. What does preventive care include? A yearly physical exam. This is also called an annual well check. Dental exams once or twice a year. Routine eye exams. Ask your health care provider how often you should have your eyes checked. Personal lifestyle choices, including: Daily care of your teeth and gums. Regular physical  activity. Eating a healthy diet. Avoiding tobacco and drug use. Limiting alcohol use. Practicing safe sex. Taking low doses of aspirin every day. Taking vitamin and mineral supplements as recommended by your health care provider. What happens during an annual well check? The services and screenings done by your health care provider during your annual well check will depend on your age, overall health, lifestyle risk factors, and family  history of disease. Counseling  Your health care provider may ask you questions about your: Alcohol use. Tobacco use. Drug use. Emotional well-being. Home and relationship well-being. Sexual activity. Eating habits. History of falls. Memory and ability to understand (cognition). Work and work Astronomer. Screening  You may have the following tests or measurements: Height, weight, and BMI. Blood pressure. Lipid and cholesterol levels. These may be checked every 5 years, or more frequently if you are over 68 years old. Skin check. Lung cancer screening. You may have this screening every year starting at age 51 if you have a 30-pack-year history of smoking and currently smoke or have quit within the past 15 years. Fecal occult blood test (FOBT) of the stool. You may have this test every year starting at age 17. Flexible sigmoidoscopy or colonoscopy. You may have a sigmoidoscopy every 5 years or a colonoscopy every 10 years starting at age 78. Prostate cancer screening. Recommendations will vary depending on your family history and other risks. Hepatitis C blood test. Hepatitis B blood test. Sexually transmitted disease (STD) testing. Diabetes screening. This is done by checking your blood sugar (glucose) after you have not eaten for a while (fasting). You may have this done every 1-3 years. Abdominal aortic aneurysm (AAA) screening. You may need this if you are a current or former smoker. Osteoporosis. You may be screened starting at age 14 if you are at high risk. Talk with your health care provider about your test results, treatment options, and if necessary, the need for more tests. Vaccines  Your health care provider may recommend certain vaccines, such as: Influenza vaccine. This is recommended every year. Tetanus, diphtheria, and acellular pertussis (Tdap, Td) vaccine. You may need a Td booster every 10 years. Zoster vaccine. You may need this after age 77. Pneumococcal  13-valent conjugate (PCV13) vaccine. One dose is recommended after age 11. Pneumococcal polysaccharide (PPSV23) vaccine. One dose is recommended after age 66. Talk to your health care provider about which screenings and vaccines you need and how often you need them. This information is not intended to replace advice given to you by your health care provider. Make sure you discuss any questions you have with your health care provider. Document Released: 04/16/2015 Document Revised: 12/08/2015 Document Reviewed: 01/19/2015 Elsevier Interactive Patient Education  2017 [employer].  Fall Prevention in the Home Falls can cause injuries. They can happen to people of all ages. There are many things you can do to make your home safe and to help prevent falls. What can I do on the outside of my home? Regularly fix the edges of walkways and driveways and fix any cracks. Remove anything that might make you trip as you walk through a door, such as a raised step or threshold. Trim any bushes or trees on the path to your home. Use bright outdoor lighting. Clear any walking paths of anything that might make someone trip, such as rocks or tools. Regularly check to see if handrails are loose or broken. Make sure that both sides of any steps have handrails. Any raised decks and  porches should have guardrails on the edges. Have any leaves, snow, or ice cleared regularly. Use sand or salt on walking paths during winter. Clean up any spills in your garage right away. This includes oil or grease spills. What can I do in the bathroom? Use night lights. Install grab bars by the toilet and in the tub and shower. Do not use towel bars as grab bars. Use non-skid mats or decals in the tub or shower. If you need to sit down in the shower, use a plastic, non-slip stool. Keep the floor dry. Clean up any water that spills on the floor as soon as it happens. Remove soap buildup in the tub or shower regularly. Attach  bath mats securely with double-sided non-slip rug tape. Do not have throw rugs and other things on the floor that can make you trip. What can I do in the bedroom? Use night lights. Make sure that you have a light by your bed that is easy to reach. Do not use any sheets or blankets that are too big for your bed. They should not hang down onto the floor. Have a firm chair that has side arms. You can use this for support while you get dressed. Do not have throw rugs and other things on the floor that can make you trip. What can I do in the kitchen? Clean up any spills right away. Avoid walking on wet floors. Keep items that you use a lot in easy-to-reach places. If you need to reach something above you, use a strong step stool that has a grab bar. Keep electrical cords out of the way. Do not use floor polish or wax that makes floors slippery. If you must use wax, use non-skid floor wax. Do not have throw rugs and other things on the floor that can make you trip. What can I do with my stairs? Do not leave any items on the stairs. Make sure that there are handrails on both sides of the stairs and use them. Fix handrails that are broken or loose. Make sure that handrails are as long as the stairways. Check any carpeting to make sure that it is firmly attached to the stairs. Fix any carpet that is loose or worn. Avoid having throw rugs at the top or bottom of the stairs. If you do have throw rugs, attach them to the floor with carpet tape. Make sure that you have a light switch at the top of the stairs and the bottom of the stairs. If you do not have them, ask someone to add them for you. What else can I do to help prevent falls? Wear shoes that: Do not have high heels. Have rubber bottoms. Are comfortable and fit you well. Are closed at the toe. Do not wear sandals. If you use a stepladder: Make sure that it is fully opened. Do not climb a closed stepladder. Make sure that both sides of the  stepladder are locked into place. Ask someone to hold it for you, if possible. Clearly mark and make sure that you can see: Any grab bars or handrails. First and last steps. Where the edge of each step is. Use tools that help you move around (mobility aids) if they are needed. These include: Canes. Walkers. Scooters. Crutches. Turn on the lights when you go into a dark area. Replace any light bulbs as soon as they burn out. Set up your furniture so you have a clear path. Avoid moving your furniture around. If  any of your floors are uneven, fix them. If there are any pets around you, be aware of where they are. Review your medicines with your doctor. Some medicines can make you feel dizzy. This can increase your chance of falling. Ask your doctor what other things that you can do to help prevent falls. This information is not intended to replace advice given to you by your health care provider. Make sure you discuss any questions you have with your health care provider. Document Released: 01/14/2009 Document Revised: 08/26/2015 Document Reviewed: 04/24/2014 Elsevier Interactive Patient Education  2017 [employer].

## 2022-07-13 NOTE — Progress Notes (Signed)
I connected with  [name]. on 07/13/22 by a audio enabled telemedicine application and verified that I am speaking with the correct person using two identifiers.  Patient Location: Home  Provider Location: Office/Clinic  I discussed the limitations of evaluation and management by telemedicine. The patient expressed understanding and agreed to proceed.  Subjective:   Austin Mercer. is a 72 y.o. male who presents for an Initial Medicare Annual Wellness Visit.  Review of Systems    Cardiac Risk Factors include: advanced age (>50men, >47 women);diabetes mellitus;hypertension;male gender;obesity (BMI >30kg/m2)     Objective:    Today's Vitals   07/13/22 1159  Weight: (!) 337 lb (152.9 kg)  Height: 6\' 2"  (1.88 m)   Body mass index is 43.27 kg/m.     07/13/2022   12:10 PM 05/17/2022    3:00 PM 05/11/2022   11:00 AM 05/11/2022    8:39 AM 01/12/2022    4:20 PM 06/28/2020    1:20 AM 06/01/2020    9:32 AM  Advanced Directives  Does Patient Have a Medical Advance Directive? No No No;Unable to assess, patient is non-responsive or altered mental status No No No No  Would patient like information on creating a medical advance directive?  No - Patient declined  No - Patient declined       Current Medications (verified) Outpatient Encounter Medications as of 07/13/2022  Medication Sig   Accu-Chek Softclix Lancets lancets Use to check blood sugar twice daily   aspirin EC 81 MG tablet Take 1 tablet (81 mg total) by mouth daily. Swallow whole. (Patient taking differently: Take 81 mg by mouth daily.)   atorvastatin (LIPITOR) 20 MG tablet Take 1 tablet (20 mg total) by mouth daily.   Blood Glucose Monitoring Suppl (ACCU-CHEK GUIDE) w/Device KIT Use to check blood sugar twice daily   Blood Pressure Monitoring (BLOOD PRESSURE KIT) DEVI Use to measure blood pressure   carvedilol (COREG) 25 MG tablet Take 1 tablet (25 mg total) by mouth 2 (two) times daily with a meal.   clopidogrel (PLAVIX)  75 MG tablet Take 1 tablet (75 mg total) by mouth daily.   dapagliflozin propanediol (FARXIGA) 10 MG TABS tablet Take 1 tablet (10 mg total) by mouth once daily before breakfast.   famotidine (PEPCID) 20 MG tablet Take 1 tablet (20 mg total) by mouth daily.   furosemide (LASIX) 40 MG tablet Take 1 tablet (40 mg total) by mouth daily.   glucose blood (ACCU-CHEK GUIDE) test strip Use to check blood sugar twice daily   losartan (COZAAR) 100 MG tablet Take 1 tablet (100 mg total) by mouth once daily.   metFORMIN (GLUCOPHAGE) 500 MG tablet Take 1 tablet (500 mg total) by mouth 2 (two) times daily with a meal.   potassium chloride (KLOR-CON) 8 MEQ tablet Take 2 tablets (16 mEq total) by mouth 2 (two) times daily.   senna-docusate (SENOKOT-S) 8.6-50 MG tablet Take 2 tablets by mouth 2 (two) times daily.   spironolactone (ALDACTONE) 25 MG tablet Take 1 tablet (25 mg total) by mouth once daily.   thiamine (VITAMIN B-1) 100 MG tablet Take 1 tablet (100 mg total) by mouth daily.   polyethylene glycol (MIRALAX / GLYCOLAX) 17 g packet Mix 17 g in 4-8 ounces of juice/water and take by mouth daily. (Patient not taking: Reported on 06/26/2022)   traMADol (ULTRAM) 50 MG tablet Take 1 tablet (50 mg total) by mouth every 12 (twelve) hours as needed for severe pain. (Patient not taking:  Reported on 07/13/2022)   No facility-administered encounter medications on file as of 07/13/2022.    Allergies (verified) Codeine and Latex   History: Past Medical History:  Diagnosis Date   Diabetes    Hypertension    Hypertensive urgency 03/04/2020   Stroke    Past Surgical History:  Procedure Laterality Date   ANKLE FRACTURE SURGERY     Family History  Problem Relation Age of Onset   Diabetes Father    Social History   Socioeconomic History   Marital status: Divorced    Spouse name: Not on file   Number of children: 2   Years of education: Not on file   Highest education level: Not on file  Occupational  History   Occupation: retired  Tobacco Use   Smoking status: Former    Types: Cigarettes    Quit date: 1973    Years since quitting: 51.3   Smokeless tobacco: Never  Vaping Use   Vaping Use: Never used  Substance and Sexual Activity   Alcohol use: Not Currently    Comment: Beer, Red wine   Drug use: Not Currently   Sexual activity: Not on file  Other Topics Concern   Not on file  Social History Narrative   Not on file   Social Determinants of Health   Financial Resource Strain: Low Risk  (07/13/2022)   Overall Financial Resource Strain (CARDIA)    Difficulty of Paying Living Expenses: Not hard at all  Food Insecurity: No Food Insecurity (07/13/2022)   Hunger Vital Sign    Worried About Running Out of Food in the Last Year: Never true    Ran Out of Food in the Last Year: Never true  Transportation Needs: No Transportation Needs (07/13/2022)   PRAPARE - Administrator, Civil Service (Medical): No    Lack of Transportation (Non-Medical): No  Physical Activity: Inactive (07/13/2022)   Exercise Vital Sign    Days of Exercise per Week: 0 days    Minutes of Exercise per Session: 0 min  Stress: Stress Concern Present (07/13/2022)   [employer] of Occupational Health - Occupational Stress Questionnaire    Feeling of Stress : To some extent  Social Connections: Not on file    Tobacco Counseling Counseling given: Not Answered   Clinical Intake:  Pre-visit preparation completed: Yes  Pain : No/denies pain     Nutritional Status: BMI > 30  Obese Nutritional Risks: None Diabetes: Yes  How often do you need to have someone help you when you read instructions, pamphlets, or other written materials from your doctor or pharmacy?: 3 - Sometimes  Diabetic? Yes Nutrition Risk Assessment:  Has the patient had any N/V/D within the last 2 months?  No  Does the patient have any non-healing wounds?  No  Has the patient had any unintentional weight loss or weight  gain?  No   Diabetes:  Is the patient diabetic?  Yes  If diabetic, was a CBG obtained today?  No  Did the patient bring in their glucometer from home?  No  How often do you monitor your CBG's? Does not.   Financial Strains and Diabetes Management:  Are you having any financial strains with the device, your supplies or your medication? No .  Does the patient want to be seen by Chronic Care Management for management of their diabetes?  No  Would the patient like to be referred to a Nutritionist or for Diabetic Management?  No  Diabetic Exams:  Diabetic Eye Exam: Completed 05/18/2021 Diabetic Foot Exam: Completed 09/13/2021   Interpreter Needed?: No  Information entered by :: NAllen LPN   Activities of Daily Living    07/13/2022   12:12 PM 05/17/2022    3:00 PM  In your present state of health, do you have any difficulty performing the following activities:  Hearing? 0 0  Comment  Simultaneous filing. User may not have seen previous data.  Vision? 0 0  Comment  Simultaneous filing. User may not have seen previous data.  Difficulty concentrating or making decisions? 0 0  Comment  Simultaneous filing. User may not have seen previous data.  Walking or climbing stairs? 0 1  Comment  Simultaneous filing. User may not have seen previous data.  Dressing or bathing? 0 0  Comment  Simultaneous filing. User may not have seen previous data.  Doing errands, shopping? 0 1  Preparing Food and eating ? N   Using the Toilet? N   In the past six months, have you accidently leaked urine? Y   Do you have problems with loss of bowel control? N   Managing your Medications? N   Managing your Finances? N   Housekeeping or managing your Housekeeping? N     Patient Care Team: Storm FriskWright, Patrick E, MD as PCP - General (Pulmonary Disease) Bridgett LarssonLewis, Jasmine D, LCSW as Social Worker (Licensed Clinical Social Worker)  Indicate any recent [facility] you may have received from other than [facility]  providers in the past year (date may be approximate).     Assessment:   This is a routine wellness examination for Cl[place]de.  Hearing/Vision screen Vision Screening - Comments:: Regular eye exams, [facility]  Dietary issues and exercise activities discussed: Current Exercise Habits: The patient does not participate in regular exercise at present   Goals Addressed             This Visit's Progress    Patient Stated       07/13/2022, wants to lose weight       Depression Screen    07/13/2022   12:12 PM 06/15/2022   10:57 AM 03/02/2022    3:57 PM 10/18/2021    2:38 PM 09/13/2021    9:17 AM 02/23/2021   10:52 AM 04/21/2020   10:05 AM  PHQ 2/9 Scores  PHQ - 2 Score 0 0 0 0 0 3 0  PHQ- 9 Score      5 0    Fall Risk    07/13/2022   12:11 PM 06/15/2022   10:57 AM 03/02/2022    3:57 PM 01/04/2022    3:00 PM 10/18/2021    2:38 PM  Fall Risk   Falls in the past year? 0 0 0 0 0  Number falls in past yr: 0 0 0 0 0  Injury with Fall? 0 0 0 0 0  Risk for fall due to : Medication side effect No Fall Risks No Fall Risks  No Fall Risks  Follow up Falls prevention discussed;Education provided;Falls evaluation completed        FALL RISK PREVENTION PERTAINING TO THE HOME:  Any stairs in or around the home? Yes  If so, are there any without handrails? No  Home free of loose throw rugs in walkways, pet beds, electrical cords, etc? Yes  Adequate lighting in your home to reduce risk of falls? Yes   ASSISTIVE DEVICES UTILIZED TO PREVENT FALLS:  Life alert? No  Use of a cane, walker  or w/c? Yes  Grab bars in the bathroom? Yes  Shower chair or bench in shower? No  Elevated toilet seat or a handicapped toilet? No   TIMED UP AND GO:  Was the test performed? No .      Cognitive Function:        07/13/2022   12:14 PM  6CIT Screen  What Year? 0 points  What month? 0 points  What time? 3 points  Count back from 20 0 points  Months in reverse 4 points  Repeat phrase 4 points   Total Score 11 points    Immunizations Immunization History  Administered Date(s) Administered   COVID-19, mRNA, vaccine(Comirnaty)12 years and older 04/20/2022   Hepatitis A, Adult 11/04/2014, 05/10/2015   Influenza,inj,Quad PF,6+ Mos 03/30/2016, 02/14/2017, 11/23/2017, 01/17/2019, 02/11/2020, 01/18/2021   Influenza-Unspecified 01/19/2011, 04/07/2014, 01/27/2015   Moderna Sars-Covid-2 Vaccination 05/14/2019, 06/11/2019, 04/05/2020   Pneumococcal Conjugate-13 04/26/2018   Pneumococcal Polysaccharide-23 04/07/2014, 04/21/2020   Tdap 06/02/2009, 04/21/2020   Zoster Recombinat (Shingrix) 08/29/2017    TDAP status: Up to date  Flu Vaccine status: Up to date  Pneumococcal vaccine status: Up to date  Covid-19 vaccine status: Completed vaccines  Qualifies for Shingles Vaccine? Yes   Zostavax completed No   Shingrix Completed?: needs second dose  Screening Tests Health Maintenance  Topic Date Due   Medicare Annual Wellness (AWV)  Never done   Zoster Vaccines- Shingrix (2 of 2) 10/24/2017   OPHTHALMOLOGY EXAM  05/18/2022   COVID-19 Vaccine (5 - 2023-24 season) 06/15/2022   FOOT EXAM  09/14/2022   Diabetic kidney evaluation - Urine ACR  10/19/2022   INFLUENZA VACCINE  11/02/2022   HEMOGLOBIN A1C  11/17/2022   Diabetic kidney evaluation - eGFR measurement  06/22/2023   COLONOSCOPY (Pts 45-34yrs Insurance coverage will need to be confirmed)  09/06/2027   DTaP/Tdap/Td (3 - Td or Tdap) 04/21/2030   Pneumonia Vaccine 83+ Years old  Completed   HPV VACCINES  Aged Out    Health Maintenance  Health Maintenance Due  Topic Date Due   Medicare Annual Wellness (AWV)  Never done   Zoster Vaccines- Shingrix (2 of 2) 10/24/2017   OPHTHALMOLOGY EXAM  05/18/2022   COVID-19 Vaccine (5 - 2023-24 season) 06/15/2022    Colorectal cancer screening: Type of screening: Colonoscopy. Completed 09/05/2017. Repeat every 10 years  Lung Cancer Screening: (Low Dose CT Chest recommended if Age 83-80  years, 30 pack-year currently smoking OR have quit w/in 15years.) does not qualify.   Lung Cancer Screening Referral: no  Additional Screening:  Hepatitis C Screening: does qualify;   Vision Screening: Recommended annual ophthalmology exams for early detection of glaucoma and other disorders of the eye. Is the patient up to date with their annual eye exam?  Yes  Who is the provider or what is the name of the office in which the patient attends annual eye exams? [facility] If pt is not established with a provider, would they like to be referred to a provider to establish care? No .   Dental Screening: Recommended annual dental exams for proper oral hygiene  Community Resource Referral / Chronic Care Management: CRR required this visit?  No   CCM required this visit?  No      Plan:     I have personally reviewed and noted the following in the patient's chart:   Medical and social history Use of alcohol, tobacco or illicit drugs  Current medications and supplements including opioid prescriptions. Patient is not currently  taking opioid prescriptions. Functional ability and status Nutritional status Physical activity Advanced directives List of other physicians Hospitalizations, surgeries, and ER visits in previous 12 months Vitals Screenings to include cognitive, depression, and falls Referrals and appointments  In addition, I have reviewed and discussed with patient certain preventive protocols, quality metrics, and best practice recommendations. A written personalized care plan for preventive services as well as general preventive health recommendations were provided to patient.     Barb Merino, LPN   0/63/0160   Nurse Notes: none  Due to this being a virtual visit, the after visit summary with patients personalized plan was offered to patient via mail or my-chart.to pick up at office at next visit

## 2022-07-17 ENCOUNTER — Other Ambulatory Visit: Payer: Self-pay | Admitting: Physician Assistant

## 2022-07-17 ENCOUNTER — Other Ambulatory Visit: Payer: Self-pay

## 2022-07-17 DIAGNOSIS — M6281 Muscle weakness (generalized): Secondary | ICD-10-CM | POA: Diagnosis not present

## 2022-07-17 DIAGNOSIS — N183 Chronic kidney disease, stage 3 unspecified: Secondary | ICD-10-CM | POA: Diagnosis not present

## 2022-07-17 DIAGNOSIS — I69398 Other sequelae of cerebral infarction: Secondary | ICD-10-CM | POA: Diagnosis not present

## 2022-07-17 DIAGNOSIS — E1122 Type 2 diabetes mellitus with diabetic chronic kidney disease: Secondary | ICD-10-CM | POA: Diagnosis not present

## 2022-07-17 DIAGNOSIS — E785 Hyperlipidemia, unspecified: Secondary | ICD-10-CM | POA: Diagnosis not present

## 2022-07-17 DIAGNOSIS — K219 Gastro-esophageal reflux disease without esophagitis: Secondary | ICD-10-CM | POA: Diagnosis not present

## 2022-07-17 DIAGNOSIS — K7460 Unspecified cirrhosis of liver: Secondary | ICD-10-CM | POA: Diagnosis not present

## 2022-07-17 DIAGNOSIS — I110 Hypertensive heart disease with heart failure: Secondary | ICD-10-CM | POA: Diagnosis not present

## 2022-07-17 DIAGNOSIS — E782 Mixed hyperlipidemia: Secondary | ICD-10-CM

## 2022-07-17 DIAGNOSIS — I5022 Chronic systolic (congestive) heart failure: Secondary | ICD-10-CM | POA: Diagnosis not present

## 2022-07-17 MED ORDER — ATORVASTATIN CALCIUM 20 MG PO TABS
20.0000 mg | ORAL_TABLET | Freq: Every day | ORAL | 1 refills | Status: DC
  Filled 2022-07-17: qty 90, 90d supply, fill #0

## 2022-07-18 ENCOUNTER — Ambulatory Visit: Payer: Self-pay | Admitting: Licensed Clinical Social Worker

## 2022-07-20 ENCOUNTER — Other Ambulatory Visit: Payer: Self-pay

## 2022-07-20 ENCOUNTER — Telehealth: Payer: Self-pay | Admitting: *Deleted

## 2022-07-20 ENCOUNTER — Telehealth: Payer: Self-pay

## 2022-07-20 ENCOUNTER — Ambulatory Visit: Payer: No Typology Code available for payment source | Attending: Physician Assistant | Admitting: Physician Assistant

## 2022-07-20 ENCOUNTER — Encounter: Payer: Self-pay | Admitting: Physician Assistant

## 2022-07-20 VITALS — BP 148/75 | HR 56 | Temp 98.6°F | Ht 74.0 in | Wt 337.0 lb

## 2022-07-20 DIAGNOSIS — R809 Proteinuria, unspecified: Secondary | ICD-10-CM | POA: Diagnosis not present

## 2022-07-20 DIAGNOSIS — Z23 Encounter for immunization: Secondary | ICD-10-CM | POA: Diagnosis not present

## 2022-07-20 DIAGNOSIS — M25561 Pain in right knee: Secondary | ICD-10-CM

## 2022-07-20 DIAGNOSIS — G8929 Other chronic pain: Secondary | ICD-10-CM | POA: Diagnosis not present

## 2022-07-20 DIAGNOSIS — E1129 Type 2 diabetes mellitus with other diabetic kidney complication: Secondary | ICD-10-CM | POA: Diagnosis not present

## 2022-07-20 LAB — GLUCOSE, POCT (MANUAL RESULT ENTRY): POC Glucose: 124 mg/dl — AB (ref 70–99)

## 2022-07-20 MED ORDER — TRAMADOL HCL 50 MG PO TABS
50.0000 mg | ORAL_TABLET | Freq: Two times a day (BID) | ORAL | 0 refills | Status: DC | PRN
  Filled 2022-07-20: qty 10, 5d supply, fill #0

## 2022-07-20 NOTE — Patient Outreach (Signed)
  Care Coordination Late Entry  Follow Up Visit Note   Encounter completed on 07/18/22 Name: Austin Mercer. MRN: [id number] DOB: 1950/07/27  Austin Mercer. is a 72 y.o. year old male who sees Storm Frisk, MD for primary care. I spoke with  [name]. And his sister by phone today.  What matters to the patients health and wellness today?  Housing/Food Insecurity/Symptom Management    Goals Addressed             This Visit's Progress    Obtain Supportive Resources-Transportation/Housing/Caregiver Fatigue   On track    Activities and task to complete in order to accomplish goals.   Keep all upcoming appointments discussed today. Discuss with providers about your interest in driving and residing alone to obtain reccommendations Continue with compliance of taking medication prescribed by Doctor Implement healthy coping skills discussed to assist with management of symptoms Continue working with [facility] care team to assist with goals identified          SDOH assessments and interventions completed:  No     Care Coordination Interventions:  Yes, provided   Follow up plan: Follow up call scheduled for 1-2 weeks    Encounter Outcome:  Pt. Visit Completed   Jenel Lucks, MSW, LCSW [facility] Care Management [facility]  [facility] [email or web address] Phone [phone] 5:41 AM

## 2022-07-20 NOTE — Telephone Encounter (Signed)
Telephone encounter was:  Successful.  07/20/2022 Name: Austin Mercer. MRN: [id number] DOB: May 23, 1950  Austin Mercer. is a 72 y.o. year old male who is a primary care patient of Storm Frisk, MD . The community resource team was consulted for assistance with Transportation Needs   Care guide performed the following interventions: Patient provided with information about care guide support team and interviewed to confirm resource needs.  Follow Up Plan:  No further follow up planned at this time. The patient has been provided with needed resources. Austin Mercer -[facility], Population Health [phone] [address] [place] , [place] [address] Email : [email or web address]       Austin Mercer. DOB: 1950/08/10 MRN: [id number]   RIDER WAIVER AND RELEASE OF LIABILITY  For purposes of improving physical access to our facilities, [facility] is pleased to partner with third parties to provide [facility] patients or other authorized individuals the option of convenient, on-demand ground transportation services (the [employer]") through use of the technology service that enables users to request on-demand ground transportation from independent third-party providers.  By opting to use and accept these [employer], I, the undersigned, hereby agree on behalf of myself, and on behalf of any minor child using the Science writer for whom I am the parent or legal guardian, as follows:  Science writer provided to me are provided by independent third-party transportation providers who are not [employer] or employees and who are unaffiliated with [employer]. [place] is neither a transportation carrier nor a common or public carrier. [facility] has no control over the quality or safety of the transportation that occurs as a result of the [employer]. [facility] cannot guarantee that any third-party  transportation provider will complete any arranged transportation service. [facility] makes no representation, warranty, or guarantee regarding the reliability, timeliness, quality, safety, suitability, or availability of any of the Transport Services or that they will be error free. I fully understand that traveling by vehicle involves risks and dangers of serious bodily injury, including permanent disability, paralysis, and death. I agree, on behalf of myself and on behalf of any minor child using the Transport Services for whom I am the parent or legal guardian, that the entire risk arising out of my use of the [employer] remains solely with me, to the maximum extent permitted under applicable law. The [employer] are provided "as is" and "as available." [facility] disclaims all representations and warranties, express, implied or statutory, not expressly set out in these terms, including the implied warranties of merchantability and fitness for a particular purpose. I hereby waive and release [facility], its agents, employees, officers, directors, representatives, insurers, attorneys, assigns, successors, subsidiaries, and affiliates from any and all past, present, or future claims, demands, liabilities, actions, causes of action, or suits of any kind directly or indirectly arising from acceptance and use of the [employer]. I further waive and release [facility] and its affiliates from all present and future liability and responsibility for any injury or death to persons or damages to property caused by or related to the use of the [employer]. I have read this Waiver and Release of Liability, and I understand the terms used in it and their legal significance. This Waiver is freely and voluntarily given with the understanding that my right (as well as the right of any minor child for whom I am the parent or legal guardian  using the [employer]) to legal recourse  against [place] in connection with the [employer] is knowingly surrendered in return for use of these services.   I attest that I read the consent document to Tom Adrienne Mercer., gave Mr. Montesinos the opportunity to ask questions and answered the questions asked (if any). I affirm that Daquon Adrienne Mercer. then provided consent for he's participation in this program.     Austin Mercer

## 2022-07-20 NOTE — Telephone Encounter (Addendum)
I met with the patient when he was in the clinic today. He said he is managing the stairs in his home.  His friend, Pernell Dupre, has been assisting him with grocery shopping but he would like to be able to go grocery shopping on his own.  He has a car and is hoping to be able to drive again in the near future.   Regarding VA services, he explained that he has been connected with the [facility] and is aware of the services they provide.  He is very pleased with his care at [facility] but but understands that in order to access many of the [place] services he needs to be connected with a provider there.  It does not mean that he has to give up his provider at [facility].  He confirmed that he knows how to contact the [place] if needed and said that he has to have a Means Test completed with them

## 2022-07-20 NOTE — Progress Notes (Signed)
Patient ID: Austin Dolores., male   DOB: [birth year], 72 y.o.   MRN: [id number]   Austin Mercer, is a 72 y.o. male  NWG:[id number]  [id number]  DOB - 09/26/1950  Chief Complaint  Patient presents with   Knee Pain    R knee issues X9 yrs, no pain today.   Yes to shingles vax.        Subjective:   Austin Mercer is a 71 y.o. male here today for R knee pain that has been going on for years.  He has seen Dr Roda Shutters for this and needed a new referral to go back.  Requesting a few tramadol and has not had an Rx since  march when he received #10.    No problems updated.  ALLERGIES: Allergies  Allergen Reactions   Codeine Itching   Latex Itching    PAST MEDICAL HISTORY: Past Medical History:  Diagnosis Date   Diabetes    Hypertension    Hypertensive urgency 03/04/2020   Stroke     MEDICATIONS AT HOME: Prior to Admission medications   Medication Sig Start Date End Date Taking? Authorizing Provider  Accu-Chek Softclix Lancets lancets Use to check blood sugar twice daily 06/06/22  Yes Storm Frisk, MD  aspirin EC 81 MG tablet Take 1 tablet (81 mg total) by mouth daily. Swallow whole. Patient taking differently: Take 81 mg by mouth daily. 05/13/22  Yes Shafer, Ludger Nutting, NP  atorvastatin (LIPITOR) 20 MG tablet Take 1 tablet (20 mg total) by mouth daily. 07/17/22  Yes Storm Frisk, MD  Blood Glucose Monitoring Suppl (ACCU-CHEK GUIDE) w/Device KIT Use to check blood sugar twice daily 06/06/22  Yes Storm Frisk, MD  Blood Pressure Monitoring (BLOOD PRESSURE KIT) DEVI Use to measure blood pressure 06/06/22  Yes Storm Frisk, MD  carvedilol (COREG) 25 MG tablet Take 1 tablet (25 mg total) by mouth 2 (two) times daily with a meal. 06/15/22  Yes Kassity Woodson M, PA-C  clopidogrel (PLAVIX) 75 MG tablet Take 1 tablet (75 mg total) by mouth daily. 06/15/22  Yes Georgian Co M, PA-C  dapagliflozin propanediol (FARXIGA) 10 MG TABS tablet Take 1 tablet (10 mg total) by mouth once daily  before breakfast. 06/15/22  Yes Ferrell Claiborne M, PA-C  famotidine (PEPCID) 20 MG tablet Take 1 tablet (20 mg total) by mouth daily. 06/15/22  Yes Anders Simmonds, PA-C  furosemide (LASIX) 40 MG tablet Take 1 tablet (40 mg total) by mouth daily. 06/15/22  Yes Georgian Co M, PA-C  glucose blood (ACCU-CHEK GUIDE) test strip Use to check blood sugar twice daily 06/06/22  Yes Storm Frisk, MD  losartan (COZAAR) 100 MG tablet Take 1 tablet (100 mg total) by mouth once daily. 06/15/22  Yes Georgian Co M, PA-C  metFORMIN (GLUCOPHAGE) 500 MG tablet Take 1 tablet (500 mg total) by mouth 2 (two) times daily with a meal. 06/15/22  Yes Marynell Bies M, PA-C  polyethylene glycol (MIRALAX / GLYCOLAX) 17 g packet Mix 17 g in 4-8 ounces of juice/water and take by mouth daily. 06/15/22  Yes Georgian Co M, PA-C  potassium chloride (KLOR-CON) 8 MEQ tablet Take 2 tablets (16 mEq total) by mouth 2 (two) times daily. 06/15/22  Yes Aarya Robinson M, PA-C  senna-docusate (SENOKOT-S) 8.6-50 MG tablet Take 2 tablets by mouth 2 (two) times daily. 06/02/22  Yes Storm Frisk, MD  spironolactone (ALDACTONE) 25 MG tablet Take 1 tablet (25 mg total) by mouth once daily. 06/15/22  Yes Georgian Co M, PA-C  thiamine (VITAMIN B-1) 100 MG tablet Take 1 tablet (100 mg total) by mouth daily. 06/02/22  Yes Storm Frisk, MD  traMADol (ULTRAM) 50 MG tablet Take 1 tablet (50 mg total) by mouth every 12 (twelve) hours as needed for severe pain. 07/20/22   Inesha Sow, Marzella Schlein, PA-C    ROS: Neg HEENT Neg resp Neg cardiac Neg GI Neg GU Neg MS Neg psych Neg neuro  Objective:   Vitals:   07/20/22 0902  BP: (!) 148/75  Pulse: (!) 56  Temp: 98.6 F (37 C)  TempSrc: Oral  SpO2: 97%  Weight: (!) 337 lb (152.9 kg)  Height:  (1.88 m)   Exam General appearance : Awake, alert, not in any distress. Speech Clear. Not toxic looking; morbidly obese;  uses a walker HEENT: Atraumatic and Normocephalic Neck:  Supple, no JVD. No cervical lymphadenopathy.  Chest: Good air entry bilaterally, CTAB.  No rales/rhonchi/wheezing CVS: S1 S2 regular, no murmurs.  Extremities: B/L Lower Ext shows no edema, both legs are warm to touch Neurology: Awake alert, and oriented X 3, CN II-XII intact, Non focal Skin: No Rash  Data Review Lab Results  Component Value Date   HGBA1C 6.2 (H) 05/19/2022   HGBA1C 6.4 (H) 01/04/2022   HGBA1C 7.2 (A) 09/13/2021    Assessment & Plan   1. Controlled type 2 diabetes mellitus with microalbuminuria, without long-term current use of insulin Controlled.   - Glucose (CBG)  2. Chronic pain of right knee Unsafe to do a proper exam due to mobility and body habitus here in the clinic - Ambulatory referral to Orthopedic Surgery - traMADol (ULTRAM) 50 MG tablet; Take 1 tablet (50 mg total) by mouth every 12 (twelve) hours as needed for severe pain.  Dispense: 10 tablet; Refill: 0    Return for scheduled app in June with Dr Delford Field.  The patient was given clear instructions to go to ER or return to medical center if symptoms don't improve, worsen or new problems develop. The patient verbalized understanding. The patient was told to call to get lab results if they haven't heard anything in the next week.      Georgian Co, PA-C [facility] [place], [place] [phone]   07/20/2022, 9:31 AM

## 2022-07-20 NOTE — Patient Instructions (Signed)
Visit Information  Thank you for taking time to visit with me today. Please don't hesitate to contact me if I can be of assistance to you.   Following are the goals we discussed today:   Goals Addressed             This Visit's Progress    Obtain Supportive Resources-Transportation/Housing/Caregiver Fatigue   On track    Activities and task to complete in order to accomplish goals.   Keep all upcoming appointments discussed today. Discuss with providers about your interest in driving and residing alone to obtain reccommendations Continue with compliance of taking medication prescribed by Doctor Implement healthy coping skills discussed to assist with management of symptoms Continue working with [facility] care team to assist with goals identified         Please call the care guide team at [phone] if you need to cancel or reschedule your appointment.   If you are experiencing a Mental Health or Behavioral Health Crisis or need someone to talk to, please call the Suicide and Crisis Lifeline: 988 call 911   The patient verbalized understanding of instructions, educational materials, and care plan provided today and DECLINED offer to receive copy of patient instructions, educational materials, and care plan.   Jenel Lucks, MSW, LCSW [facility] Care Management [place]  [facility] [email or web address] Phone [phone] 5:42 AM

## 2022-07-24 ENCOUNTER — Encounter: Payer: Self-pay | Admitting: Licensed Clinical Social Worker

## 2022-07-24 DIAGNOSIS — E1122 Type 2 diabetes mellitus with diabetic chronic kidney disease: Secondary | ICD-10-CM | POA: Diagnosis not present

## 2022-07-24 DIAGNOSIS — K219 Gastro-esophageal reflux disease without esophagitis: Secondary | ICD-10-CM | POA: Diagnosis not present

## 2022-07-24 DIAGNOSIS — E785 Hyperlipidemia, unspecified: Secondary | ICD-10-CM | POA: Diagnosis not present

## 2022-07-24 DIAGNOSIS — M6281 Muscle weakness (generalized): Secondary | ICD-10-CM | POA: Diagnosis not present

## 2022-07-24 DIAGNOSIS — K7460 Unspecified cirrhosis of liver: Secondary | ICD-10-CM | POA: Diagnosis not present

## 2022-07-24 DIAGNOSIS — I110 Hypertensive heart disease with heart failure: Secondary | ICD-10-CM | POA: Diagnosis not present

## 2022-07-24 DIAGNOSIS — N183 Chronic kidney disease, stage 3 unspecified: Secondary | ICD-10-CM | POA: Diagnosis not present

## 2022-07-24 DIAGNOSIS — I69398 Other sequelae of cerebral infarction: Secondary | ICD-10-CM | POA: Diagnosis not present

## 2022-07-24 DIAGNOSIS — I5022 Chronic systolic (congestive) heart failure: Secondary | ICD-10-CM | POA: Diagnosis not present

## 2022-07-24 NOTE — Patient Outreach (Signed)
  Care Coordination   Follow Up Visit Note   07/24/2022 Name: Austin Mercer. MRN: [id number] DOB: 1950/06/30  Austin Mercer. is a 72 y.o. year old male who sees Storm Frisk, MD for primary care.  What matters to the patients health and wellness today?  Patient was not engaged during this encounter. LCSW provided supportive information to pt's sister, per request     SDOH assessments and interventions completed:  No     Care Coordination Interventions:  Yes, provided   Follow up plan: Follow up call scheduled for 2-4 weeks    Encounter Outcome:  Pt. Visit Completed   Jenel Lucks, MSW, LCSW [facility] Care Management [facility]  [facility] [email or web address] Phone [phone] 4:59 PM

## 2022-07-25 ENCOUNTER — Telehealth: Payer: Self-pay

## 2022-07-25 NOTE — Telephone Encounter (Signed)
Copied from CRM [id number]. Topic: Transportation - Transportation >> Jul 25, 2022 10:44 AM Pincus Sanes wrote:  Reason for CRM: Pt contact ,Austin Mercer, is following up due to: She got another letter from [employer] pt stating that the application sent by office was incomplete. They need refaxed and more info from dr. Please fu with Austin at [phone]

## 2022-07-26 DIAGNOSIS — I5022 Chronic systolic (congestive) heart failure: Secondary | ICD-10-CM | POA: Diagnosis not present

## 2022-07-26 DIAGNOSIS — E785 Hyperlipidemia, unspecified: Secondary | ICD-10-CM | POA: Diagnosis not present

## 2022-07-26 DIAGNOSIS — E1122 Type 2 diabetes mellitus with diabetic chronic kidney disease: Secondary | ICD-10-CM | POA: Diagnosis not present

## 2022-07-26 DIAGNOSIS — I110 Hypertensive heart disease with heart failure: Secondary | ICD-10-CM | POA: Diagnosis not present

## 2022-07-26 DIAGNOSIS — M6281 Muscle weakness (generalized): Secondary | ICD-10-CM | POA: Diagnosis not present

## 2022-07-26 DIAGNOSIS — K7460 Unspecified cirrhosis of liver: Secondary | ICD-10-CM | POA: Diagnosis not present

## 2022-07-26 DIAGNOSIS — N183 Chronic kidney disease, stage 3 unspecified: Secondary | ICD-10-CM | POA: Diagnosis not present

## 2022-07-26 DIAGNOSIS — K219 Gastro-esophageal reflux disease without esophagitis: Secondary | ICD-10-CM | POA: Diagnosis not present

## 2022-07-26 DIAGNOSIS — I69398 Other sequelae of cerebral infarction: Secondary | ICD-10-CM | POA: Diagnosis not present

## 2022-07-27 ENCOUNTER — Ambulatory Visit: Payer: Self-pay

## 2022-07-27 NOTE — Patient Outreach (Signed)
  Care Coordination   07/27/2022 Name: Austin Mercer. MRN: [id number] DOB: Jan 06, 1951   Care Coordination Outreach Attempts:  An unsuccessful telephone outreach was attempted for a scheduled appointment today.  Follow Up Plan:  Additional outreach attempts will be made to offer the patient care coordination information and services.   Encounter Outcome:  No Answer   Care Coordination Interventions:  No, not indicated    Delsa Sale, RN, BSN, CCM Care Management Coordinator [facility] Care Management  Direct Phone: [phone]

## 2022-07-27 NOTE — Telephone Encounter (Signed)
noted 

## 2022-07-27 NOTE — Telephone Encounter (Addendum)
Spoke with patient . Verified name & DOB  patient read to me exactly with the letter she receive said. [place] requiring explanation of physical limitation. Information add to application and refaxed.

## 2022-07-31 ENCOUNTER — Telehealth: Payer: Self-pay | Admitting: *Deleted

## 2022-07-31 NOTE — Progress Notes (Signed)
  Care Coordination Note  07/31/2022 Name: Austin Mercer. MRN: [id number] DOB: [birth year]  Austin Mercer. is a 72 y.o. year old male who is a primary care patient of Storm Frisk, MD and is actively engaged with the care management team. I reached out to [employer]. by phone today to assist with re-scheduling a follow up visit with the RN Case Manager  Follow up plan: Unsuccessful telephone outreach attempt made. A HIPAA compliant phone message was left for the patient providing contact information and requesting a return call.   [facility]  Care Coordination Care Guide  Direct Dial: [phone]

## 2022-08-01 DIAGNOSIS — N183 Chronic kidney disease, stage 3 unspecified: Secondary | ICD-10-CM | POA: Diagnosis not present

## 2022-08-01 DIAGNOSIS — K219 Gastro-esophageal reflux disease without esophagitis: Secondary | ICD-10-CM | POA: Diagnosis not present

## 2022-08-01 DIAGNOSIS — E1122 Type 2 diabetes mellitus with diabetic chronic kidney disease: Secondary | ICD-10-CM | POA: Diagnosis not present

## 2022-08-01 DIAGNOSIS — M6281 Muscle weakness (generalized): Secondary | ICD-10-CM | POA: Diagnosis not present

## 2022-08-01 DIAGNOSIS — E785 Hyperlipidemia, unspecified: Secondary | ICD-10-CM | POA: Diagnosis not present

## 2022-08-01 DIAGNOSIS — K7460 Unspecified cirrhosis of liver: Secondary | ICD-10-CM | POA: Diagnosis not present

## 2022-08-01 DIAGNOSIS — I5022 Chronic systolic (congestive) heart failure: Secondary | ICD-10-CM | POA: Diagnosis not present

## 2022-08-01 DIAGNOSIS — I69398 Other sequelae of cerebral infarction: Secondary | ICD-10-CM | POA: Diagnosis not present

## 2022-08-01 DIAGNOSIS — I110 Hypertensive heart disease with heart failure: Secondary | ICD-10-CM | POA: Diagnosis not present

## 2022-08-03 ENCOUNTER — Ambulatory Visit: Payer: Medicare PPO | Admitting: Orthopaedic Surgery

## 2022-08-03 ENCOUNTER — Other Ambulatory Visit (INDEPENDENT_AMBULATORY_CARE_PROVIDER_SITE_OTHER): Payer: Medicare PPO

## 2022-08-03 DIAGNOSIS — M25561 Pain in right knee: Secondary | ICD-10-CM

## 2022-08-03 DIAGNOSIS — M1711 Unilateral primary osteoarthritis, right knee: Secondary | ICD-10-CM | POA: Diagnosis not present

## 2022-08-03 MED ORDER — METHYLPREDNISOLONE ACETATE 40 MG/ML IJ SUSP
40.0000 mg | INTRAMUSCULAR | Status: AC | PRN
Start: 2022-08-03 — End: 2022-08-03
  Administered 2022-08-03: 40 mg via INTRA_ARTICULAR

## 2022-08-03 MED ORDER — LIDOCAINE HCL 1 % IJ SOLN
2.0000 mL | INTRAMUSCULAR | Status: AC | PRN
Start: 2022-08-03 — End: 2022-08-03
  Administered 2022-08-03: 2 mL

## 2022-08-03 MED ORDER — BUPIVACAINE HCL 0.5 % IJ SOLN
2.0000 mL | INTRAMUSCULAR | Status: AC | PRN
  Administered 2022-08-03: 2 mL via INTRA_ARTICULAR

## 2022-08-03 NOTE — Progress Notes (Signed)
Office Visit Note   Patient: Austin Mercer.           Date of Birth: 11-13-1950           MRN: [id number] Visit Date: 08/03/2022              Requested by: Anders Simmonds, PA-C [address] [place] [address] [place],  [place] [address] PCP: Storm Frisk, MD   Assessment & Plan: Visit Diagnoses:  1. Primary osteoarthritis of right knee     Plan: Impression 72 year old gentleman with advanced right knee DJD with varus deformity and bone-on-bone joint space narrowing the medial compartment.  Treatment options were explained and he would like to try steroid injection.  He tolerated this well.  Will see him back as needed.  Follow-Up Instructions: No follow-ups on file.   Orders:  Orders Placed This Encounter  Procedures   XR KNEE 3 VIEW RIGHT   No orders of the defined types were placed in this encounter.     Procedures: Large Joint Inj: R knee on 08/03/2022 10:06 AM Indications: pain Details: 22 G needle  Arthrogram: No  Medications: 40 mg methylPREDNISolone acetate 40 MG/ML; 2 mL lidocaine 1 %; 2 mL bupivacaine 0.5 % Consent was given by the patient. Patient was prepped and draped in the usual sterile fashion.       Clinical Data: No additional findings.   Subjective: Chief Complaint  Patient presents with   Right Knee - Pain    HPI Patient is a 72 year old gentleman here for evaluation of chronic right knee pain.  Denies any recent injuries or trauma.  Walks with a walker at baseline.  Has had steroid injections in the past with relief.  Forcing another injection today.  Denies any mechanical symptoms. Review of Systems  Constitutional: Negative.   HENT: Negative.    Eyes: Negative.   Respiratory: Negative.    Cardiovascular: Negative.   Gastrointestinal: Negative.   Endocrine: Negative.   Genitourinary: Negative.   Skin: Negative.   Allergic/Immunologic: Negative.   Neurological: Negative.   Hematological: Negative.   Psychiatric/Behavioral:  Negative.    All other systems reviewed and are negative.    Objective: Vital Signs: There were no vitals taken for this visit.  Physical Exam Vitals and nursing note reviewed.  Constitutional:      Appearance: He is well-developed.  HENT:     Head: Normocephalic and atraumatic.  Eyes:     Pupils: Pupils are equal, round, and reactive to light.  Pulmonary:     Effort: Pulmonary effort is normal.  Abdominal:     Palpations: Abdomen is soft.  Musculoskeletal:        General: Normal range of motion.     Cervical back: Neck supple.  Skin:    General: Skin is warm.  Neurological:     Mental Status: He is alert and oriented to person, place, and time.  Psychiatric:        Behavior: Behavior normal.        Thought Content: Thought content normal.        Judgment: Judgment normal.     Ortho Exam Examination right knee shows a trace effusion.  Medial joint line tenderness.  Collaterals and cruciates are stable.  There is pain and crepitus with range of motion. Specialty Comments:  No specialty comments available.  Imaging: XR KNEE 3 VIEW RIGHT  Result Date: 08/03/2022 X-rays demonstrate severe osteoarthritis.  Bone-on-bone joint space narrowing varus deformity.  PMFS History: Patient Active Problem List   Diagnosis Date Noted   CVA (cerebral vascular accident) (HCC) 05/17/2022   Acute ischemic stroke (HCC) 05/11/2022   Microalbuminuria due to type 2 diabetes mellitus (HCC) 10/21/2021   Allergic rhinitis 10/18/2021   Hypophosphatemia 09/14/2021   Liver cirrhosis (HCC) 09/12/2021   Moderate episode of recurrent major depressive disorder (HCC) 09/09/2020   Toenail fungus 04/21/2020   Chronic systolic heart failure (HCC) 03/11/2020   First degree AV block 03/04/2020   PVC (premature ventricular contraction) 03/04/2020   Depression 03/04/2020   History of alcohol use disorder 04/26/2018   Erectile dysfunction of organic origin 04/26/2018   Positive for  macroalbuminuria 01/27/2018   Hypercalcemia 01/24/2018   Controlled type 2 diabetes mellitus without complication, without long-term current use of insulin (HCC) 11/23/2017   Essential hypertension 11/23/2017   Primary osteoarthritis of right knee 11/23/2017   Class 3 severe obesity due to excess calories with serious comorbidity and body mass index (BMI) of 45.0 to 49.9 in adult (HCC) 11/23/2017   Age-related cataract of right eye 11/23/2017   Hepatitis C virus infection cured after antiviral drug therapy 11/23/2017   Chronic, continuous use of opioids 11/23/2017   Past Medical History:  Diagnosis Date   Diabetes (HCC)    Hypertension    Hypertensive urgency 03/04/2020   Stroke [facility])     Family History  Problem Relation Age of Onset   Diabetes Father     Past Surgical History:  Procedure Laterality Date   ANKLE FRACTURE SURGERY     Social History   Occupational History   Occupation: retired  Tobacco Use   Smoking status: Former    Types: Cigarettes    Quit date: 1973    Years since quitting: 51.3   Smokeless tobacco: Never  Vaping Use   Vaping Use: Never used  Substance and Sexual Activity   Alcohol use: Not Currently    Comment: Beer, Red wine   Drug use: Not Currently   Sexual activity: Not on file

## 2022-08-04 ENCOUNTER — Telehealth: Payer: Self-pay | Admitting: Licensed Clinical Social Worker

## 2022-08-07 NOTE — Patient Instructions (Signed)
Visit Information  Thank you for taking time to visit with me today. Please don't hesitate to contact me if I can be of assistance to you.   Following are the goals we discussed today:   Goals Addressed             This Visit's Progress    Obtain Supportive Resources-Transportation/Housing/Caregiver Fatigue   On track    Activities and task to complete in order to accomplish goals.   Keep all upcoming appointments discussed today. Discuss with providers about your interest in driving and residing alone to obtain reccommendations Continue with compliance of taking medication prescribed by Doctor Implement healthy coping skills discussed to assist with management of symptoms Continue working with [facility] care team to assist with goals identified          Our next appointment is by telephone on 5/22 at 3 PM  Please call the care guide team at [phone] if you need to cancel or reschedule your appointment.   If you are experiencing a Mental Health or Behavioral Health Crisis or need someone to talk to, please call the Suicide and Crisis Lifeline: 988 call 911   The patient verbalized understanding of instructions, educational materials, and care plan provided today and DECLINED offer to receive copy of patient instructions, educational materials, and care plan.   Jenel Lucks, MSW, LCSW [facility] Care Management [place]  [facility] [email or web address] Phone [phone] 4:28 PM

## 2022-08-07 NOTE — Progress Notes (Signed)
  Care Coordination Note  08/07/2022 Name: Austin Mercer. MRN: [id number] DOB: Sep 28, 1950  Austin Mercer. is a 72 y.o. year old male who is a primary care patient of Storm Frisk, MD and is actively engaged with the care management team. I reached out to [employer]. by phone today to assist with re-scheduling a follow up visit with the RN Case Manager  Follow up plan: Unsuccessful telephone outreach attempt made. A HIPAA compliant phone message was left for the patient providing contact information and requesting a return call. We have been unable to make contact with the patient for follow up.  [facility]  Care Coordination Care Guide  Direct Dial: [phone]

## 2022-08-07 NOTE — Patient Outreach (Signed)
  Care Coordination   Follow Up Visit Note   08/04/2022 Name: Austin Mercer. MRN: [id number] DOB: [birth year]  Austin Mercer. is a 72 y.o. year old male who sees Storm Frisk, MD for primary care. I spoke with  Austin Mercer. by phone today.  What matters to the patients health and wellness today?  Symptom Management/Stress Managment    Goals Addressed             This Visit's Progress    Obtain Supportive Resources-Transportation/Housing/Caregiver Fatigue   On track    Activities and task to complete in order to accomplish goals.   Keep all upcoming appointments discussed today. Discuss with providers about your interest in driving and residing alone to obtain reccommendations Continue with compliance of taking medication prescribed by Doctor Implement healthy coping skills discussed to assist with management of symptoms Continue working with [facility] care team to assist with goals identified          SDOH assessments and interventions completed:  No     Care Coordination Interventions:  Yes, provided  Interventions Today    Flowsheet Row Most Recent Value  Chronic Disease   Chronic disease during today's visit Hypertension (HTN), Diabetes  General Interventions   General Interventions Discussed/Reviewed General Interventions Reviewed, Doctor Visits  [LCSW reviewed upcoming appts]  Doctor Visits Discussed/Reviewed Doctor Visits Reviewed, Specialist  [Pt reports a decrease in pain after injection]  Mental Health Interventions   Mental Health Discussed/Reviewed Mental Health Reviewed, Coping Strategies, Anxiety, Depression  [Strategies to assist with stress management discussed. Triggers identified and strategies to assist with promoting self-care discussed]  Nutrition Interventions   Nutrition Discussed/Reviewed Nutrition Discussed, Adding fruits and vegetables, Decreasing sugar intake  Pharmacy Interventions   Pharmacy Dicussed/Reviewed Medication Adherence        Follow up plan: Follow up call scheduled for 2 weeks    Encounter Outcome:  Pt. Visit Completed   Jenel Lucks, MSW, LCSW [facility] Care Management [facility]  [facility] [email or web address] Phone [phone] 4:27 PM

## 2022-08-08 ENCOUNTER — Telehealth: Payer: Self-pay | Admitting: *Deleted

## 2022-08-08 NOTE — Progress Notes (Signed)
  Care Coordination Note  08/08/2022 Name: Austin Mercer. MRN: [id number] DOB: [birth year]  Austin Mercer. is a 72 y.o. year old male who is a primary care patient of Storm Frisk, MD and is actively engaged with the care management team. I reached out to [employer]. by phone today to assist with re-scheduling a follow up visit with the RN Case Manager  Follow up plan: Telephone appointment with care management team member scheduled for:08/30/22  [facility] Coordination Care Guide  Direct Dial: [phone]

## 2022-08-18 ENCOUNTER — Other Ambulatory Visit: Payer: Self-pay

## 2022-08-22 ENCOUNTER — Other Ambulatory Visit: Payer: Self-pay

## 2022-08-22 NOTE — Patient Outreach (Signed)
First telephone outreach attempt to obtain mRS. No answer. Left message for returned call.  Ife Vitelli [facility]-Care Management Assistant [phone]  

## 2022-08-23 ENCOUNTER — Other Ambulatory Visit: Payer: Self-pay

## 2022-08-23 ENCOUNTER — Ambulatory Visit: Payer: Self-pay | Admitting: Licensed Clinical Social Worker

## 2022-08-24 ENCOUNTER — Other Ambulatory Visit: Payer: Self-pay

## 2022-08-24 NOTE — Patient Outreach (Signed)
Telephone outreach to patient to obtain mRS was successfully completed. MRS= 3  Austin Mercer [facility] Care Management Assistant [phone]  

## 2022-08-24 NOTE — Patient Outreach (Signed)
  Care Coordination   Follow Up Visit Note   08/23/2022 Name: Austin Mercer. MRN: [id number] DOB: [birth year]  Austin Paityn Phimmasone. is a 72 y.o. year old male who sees Storm Frisk, MD for primary care. I spoke with  Austin Lathe Jr.'s friend, Elva by phone today.  What matters to the patients health and wellness today?  Symptom Management and Transportation    Goals Addressed             This Visit's Progress    Obtain Supportive Resources-Transportation/Housing/Caregiver Fatigue   On track    Activities and task to complete in order to accomplish goals.   Keep all upcoming appointments discussed today. Discuss with providers about your interest in driving and residing alone to obtain reccommendations Continue with compliance of taking medication prescribed by Doctor Implement healthy coping skills discussed to assist with management of symptoms Continue working with [facility] care team to assist with goals identified          SDOH assessments and interventions completed:  No     Care Coordination Interventions:  Yes, provided  Interventions Today    Flowsheet Row Most Recent Value  Chronic Disease   Chronic disease during today's visit Diabetes  General Interventions   General Interventions Discussed/Reviewed General Interventions Reviewed, [employer], Doctor Visits  [Patient has not heard from [facility] [place]]  Doctor Visits Discussed/Reviewed Doctor Visits Reviewed  Mental Health Interventions   Mental Health Discussed/Reviewed Mental Health Reviewed, Coping Strategies  [Patient can be irritable,  however, strategies discussed to assist with re-direction]  Nutrition Interventions   Nutrition Discussed/Reviewed Nutrition Reviewed  Pharmacy Interventions   Pharmacy Dicussed/Reviewed Pharmacy Topics Reviewed       Follow up plan: Follow up call scheduled for 2-4 weeks    Encounter Outcome:  Pt. Visit Completed   Jenel Lucks, MSW, LCSW [facility] Care  Management [facility] Health  [facility] [email or web address] Phone [phone] 5:13 PM

## 2022-08-24 NOTE — Patient Instructions (Signed)
Visit Information  Thank you for taking time to visit with me today. Please don't hesitate to contact me if I can be of assistance to you.   Following are the goals we discussed today:   Goals Addressed             This Visit's Progress    Obtain Supportive Resources-Transportation/Housing/Caregiver Fatigue   On track    Activities and task to complete in order to accomplish goals.   Keep all upcoming appointments discussed today. Discuss with providers about your interest in driving and residing alone to obtain reccommendations Continue with compliance of taking medication prescribed by Doctor Implement healthy coping skills discussed to assist with management of symptoms Continue working with [facility] care team to assist with goals identified          Please call the care guide team at [phone] if you need to cancel or reschedule your appointment.   If you are experiencing a Mental Health or Behavioral Health Crisis or need someone to talk to, please call the Suicide and Crisis Lifeline: 988 call 911   The patient verbalized understanding of instructions, educational materials, and care plan provided today and DECLINED offer to receive copy of patient instructions, educational materials, and care plan.   Jenel Lucks, MSW, LCSW [facility] Care Management [place]  [facility] [email or web address] Phone [phone] 5:13 PM

## 2022-08-30 ENCOUNTER — Ambulatory Visit: Payer: Self-pay

## 2022-08-30 NOTE — Patient Outreach (Signed)
  Care Coordination   Follow Up Visit Note   08/30/2022 Name: Austin Mercer. MRN: [id number] DOB: May 01, 1950  Austin Mercer. is a 72 y.o. year old male who sees Storm Frisk, MD for primary care. I spoke with  [name]. by phone today.  What matters to the patients health and wellness today?  Patient would like to start taking Vitamins to help with fatigue and tiredness.     Goals Addressed             This Visit's Progress    To discuss with doctor about taking some vitamins       Care Coordination Interventions: Discussed with patient he is experiencing increased fatigue and tiredness Determined patient would like to discuss with his doctor at next scheduled appointment about taking Vitamins Reviewed with patient his last Vitamin D level was checked about 4 years ago and was within normal range Educated patient about the increased risk for developing Vitamin D deficiency due to ethnicity and lack of natural sunlight Reviewed next upcoming scheduled PCP visit with patient scheduled for 09/20/22 @10 :30 AM with Dr. Delford Field Determined patient will discuss having a Vitamin D recheck with his doctor  Mailed printed educational materials related to Vitamin D  Routed note to Dr. Delford Field regarding patient's concerns and request to check Vitamin D at next scheduled visit        COMPLETED: To get some help with my right knee pain       Care Coordination Interventions: Determined patient completed a follow up with Orthopedics for evaluation and treatment of his right knee pain Discussed with patient, he received a steroid injection and his pain has been relieved, he voices having no further c/o related to knee pain at this time Instructed patient to keep his doctor informed of new or worsening symptoms      To keep prediabetes under good control       Care Coordination Interventions: Advised patient, providing education and rationale, to check cbg daily before meals and  record, calling PCP for findings outside established parameters Educated patient about daily glycemic control, FBS 80-130, <180 after meals Mailed printed educational materials related to PREP and diabetes management  Lab Results  Component Value Date   HGBA1C 6.2 (H) 05/19/2022      Interventions Today    Flowsheet Row Most Recent Value  Chronic Disease   Chronic disease during today's visit Other, Diabetes  [chronic knee pain,  fatigue, tiredness]  General Interventions   General Interventions Discussed/Reviewed General Interventions Discussed, General Interventions Reviewed, Doctor Visits  Doctor Visits Discussed/Reviewed PCP, Doctor Visits Reviewed, Doctor Visits Discussed, Specialist  Education Interventions   Education Provided Provided Education, Provided Printed Education  Provided Verbal Education On Blood Sugar Monitoring, Labs  Labs Reviewed --  [Vitamin D]          SDOH assessments and interventions completed:  No     Care Coordination Interventions:  Yes, provided   Follow up plan: Follow up call scheduled for 09/22/22 @11 :00 AM    Encounter Outcome:  Pt. Visit Completed

## 2022-08-30 NOTE — Patient Instructions (Signed)
Visit Information  Thank you for taking time to visit with me today. Please don't hesitate to contact me if I can be of assistance to you.   Following are the goals we discussed today:   Goals Addressed             This Visit's Progress    To discuss with doctor about taking some vitamins       Care Coordination Interventions: Discussed with patient he is experiencing increased fatigue and tiredness Determined patient would like to discuss with his doctor at next scheduled appointment about taking Vitamins Reviewed with patient his last Vitamin D level was checked about 4 years ago and was within normal range Educated patient about the increased risk for developing Vitamin D deficiency due to ethnicity and lack of natural sunlight Reviewed next upcoming scheduled PCP visit with patient scheduled for 09/20/22 @10 :30 AM with Dr. Delford Field Determined patient will discuss having a Vitamin D recheck with his doctor  Mailed printed educational materials related to Vitamin D  Routed note to Dr. Delford Field regarding patient's concerns and request to check Vitamin D at next scheduled visit        COMPLETED: To get some help with my right knee pain       Care Coordination Interventions: Determined patient completed a follow up with Orthopedics for evaluation and treatment of his right knee pain Discussed with patient, he received a steroid injection and his pain has been relieved, he voices having no further c/o related to knee pain at this time Instructed patient to keep his doctor informed of new or worsening symptoms      To keep prediabetes under good control       Care Coordination Interventions: Advised patient, providing education and rationale, to check cbg daily before meals and record, calling PCP for findings outside established parameters Educated patient about daily glycemic control, FBS 80-130, <180 after meals Mailed printed educational materials related to PREP and diabetes  management  Lab Results  Component Value Date   HGBA1C 6.2 (H) 05/19/2022             Our next appointment is by telephone on 09/22/22 at 11:00 AM  Please call the care guide team at [phone] if you need to cancel or reschedule your appointment.   If you are experiencing a Mental Health or Behavioral Health Crisis or need someone to talk to, please call 1-800-273-TALK (toll free, 24 hour hotline) go to [facility] [address] [place] [phone])  The patient verbalized understanding of instructions, educational materials, and care plan provided today and DECLINED offer to receive copy of patient instructions, educational materials, and care plan.   Delsa Sale, RN, BSN, CCM Care Management Coordinator [facility] Care Management  Direct Phone: [phone]

## 2022-09-11 ENCOUNTER — Ambulatory Visit: Payer: Medicare PPO | Admitting: Podiatry

## 2022-09-11 ENCOUNTER — Encounter: Payer: Self-pay | Admitting: Podiatry

## 2022-09-11 DIAGNOSIS — E119 Type 2 diabetes mellitus without complications: Secondary | ICD-10-CM

## 2022-09-11 DIAGNOSIS — M79675 Pain in left toe(s): Secondary | ICD-10-CM | POA: Diagnosis not present

## 2022-09-11 DIAGNOSIS — M79674 Pain in right toe(s): Secondary | ICD-10-CM | POA: Diagnosis not present

## 2022-09-11 DIAGNOSIS — B351 Tinea unguium: Secondary | ICD-10-CM | POA: Diagnosis not present

## 2022-09-11 NOTE — Progress Notes (Signed)
This patient returns to my office for at risk foot care.  This patient requires this care by a professional since this patient will be at risk due to having diabetes and CVA.  This patient is unable to cut nails himself since the patient cannot reach his nails.These nails are painful walking and wearing shoes.  This patient presents for at risk foot care today.  General Appearance  Alert, conversant and in no acute stress.  Vascular  Dorsalis pedis and posterior tibial  pulses are palpable  bilaterally.  Capillary return is within normal limits  bilaterally. Temperature is within normal limits  bilaterally.  Neurologic  Senn-Weinstein monofilament wire test diminished   bilaterally. Muscle power within normal limits bilaterally.  Nails Thick disfigured discolored nails with subungual debris  from hallux to fifth toes bilaterally. No evidence of bacterial infection or drainage bilaterally.  Orthopedic  No limitations of motion  feet .  No crepitus or effusions noted.  No bony pathology or digital deformities noted.  Skin  normotropic skin with no porokeratosis noted bilaterally.  No signs of infections or ulcers noted.     Onychomycosis  Pain in right toes  Pain in left toes  Consent was obtained for treatment procedures.   Mechanical debridement of nails 1-5  bilaterally performed with a nail nipper.  Filed with dremel without incident.    Return office visit     3 months                 Told patient to return for periodic foot care and evaluation due to potential at risk complications.   Helane Gunther DPM

## 2022-09-14 ENCOUNTER — Ambulatory Visit: Payer: Medicare PPO | Admitting: Podiatry

## 2022-09-18 ENCOUNTER — Other Ambulatory Visit: Payer: Self-pay

## 2022-09-18 ENCOUNTER — Other Ambulatory Visit: Payer: Self-pay | Admitting: Physician Assistant

## 2022-09-18 DIAGNOSIS — E1129 Type 2 diabetes mellitus with other diabetic kidney complication: Secondary | ICD-10-CM

## 2022-09-18 DIAGNOSIS — I5022 Chronic systolic (congestive) heart failure: Secondary | ICD-10-CM

## 2022-09-18 DIAGNOSIS — Z87898 Personal history of other specified conditions: Secondary | ICD-10-CM

## 2022-09-18 MED ORDER — DAPAGLIFLOZIN PROPANEDIOL 10 MG PO TABS
10.0000 mg | ORAL_TABLET | Freq: Every day | ORAL | 1 refills | Status: DC
  Filled 2022-09-18: qty 30, 30d supply, fill #0
  Filled 2022-10-12: qty 30, 30d supply, fill #1
  Filled 2022-11-10: qty 30, 30d supply, fill #2
  Filled 2023-01-08: qty 30, 30d supply, fill #3
  Filled 2023-02-05: qty 30, 30d supply, fill #4
  Filled 2023-02-09 – 2023-03-19 (×3): qty 30, 30d supply, fill #5

## 2022-09-18 MED ORDER — FUROSEMIDE 40 MG PO TABS
40.0000 mg | ORAL_TABLET | Freq: Every day | ORAL | 1 refills | Status: DC
Start: 2022-09-18 — End: 2023-03-05
  Filled 2022-09-18: qty 60, 60d supply, fill #0
  Filled 2022-11-10: qty 60, 60d supply, fill #1
  Filled 2023-02-05: qty 60, 60d supply, fill #2

## 2022-09-18 MED ORDER — FAMOTIDINE 20 MG PO TABS
20.0000 mg | ORAL_TABLET | Freq: Every day | ORAL | 1 refills | Status: DC
Start: 2022-09-18 — End: 2023-03-05
  Filled 2022-09-18: qty 60, 60d supply, fill #0
  Filled 2022-11-10: qty 60, 60d supply, fill #1
  Filled 2023-02-05: qty 60, 60d supply, fill #2

## 2022-09-19 ENCOUNTER — Ambulatory Visit: Payer: No Typology Code available for payment source | Admitting: Critical Care Medicine

## 2022-09-19 ENCOUNTER — Telehealth: Payer: Self-pay

## 2022-09-19 NOTE — Telephone Encounter (Signed)
Patient attempted to be outreached by Johne Buckle, PharmD Candidate on 09/19/22 to discuss hypertension. Left voicemail for patient to return our call at their convenience at [phone].  Jeanae Whitmill, Student-PharmD 

## 2022-09-20 ENCOUNTER — Ambulatory Visit: Payer: Medicare PPO | Attending: Critical Care Medicine | Admitting: Critical Care Medicine

## 2022-09-20 ENCOUNTER — Ambulatory Visit: Payer: No Typology Code available for payment source | Admitting: Critical Care Medicine

## 2022-09-20 ENCOUNTER — Encounter: Payer: Self-pay | Admitting: Critical Care Medicine

## 2022-09-20 ENCOUNTER — Other Ambulatory Visit: Payer: Self-pay

## 2022-09-20 VITALS — BP 138/86 | HR 62 | Wt 342.6 lb

## 2022-09-20 DIAGNOSIS — I639 Cerebral infarction, unspecified: Secondary | ICD-10-CM

## 2022-09-20 DIAGNOSIS — E11630 Type 2 diabetes mellitus with periodontal disease: Secondary | ICD-10-CM | POA: Diagnosis not present

## 2022-09-20 DIAGNOSIS — I10 Essential (primary) hypertension: Secondary | ICD-10-CM

## 2022-09-20 DIAGNOSIS — I5022 Chronic systolic (congestive) heart failure: Secondary | ICD-10-CM

## 2022-09-20 DIAGNOSIS — Z7984 Long term (current) use of oral hypoglycemic drugs: Secondary | ICD-10-CM

## 2022-09-20 DIAGNOSIS — E119 Type 2 diabetes mellitus without complications: Secondary | ICD-10-CM | POA: Diagnosis not present

## 2022-09-20 DIAGNOSIS — E782 Mixed hyperlipidemia: Secondary | ICD-10-CM | POA: Diagnosis not present

## 2022-09-20 DIAGNOSIS — F331 Major depressive disorder, recurrent, moderate: Secondary | ICD-10-CM | POA: Diagnosis not present

## 2022-09-20 DIAGNOSIS — E1129 Type 2 diabetes mellitus with other diabetic kidney complication: Secondary | ICD-10-CM | POA: Diagnosis not present

## 2022-09-20 DIAGNOSIS — R809 Proteinuria, unspecified: Secondary | ICD-10-CM

## 2022-09-20 MED ORDER — ATORVASTATIN CALCIUM 20 MG PO TABS
20.0000 mg | ORAL_TABLET | Freq: Every day | ORAL | 1 refills | Status: DC
Start: 2022-09-20 — End: 2023-07-02
  Filled 2022-09-20 – 2022-10-12 (×2): qty 90, 90d supply, fill #0
  Filled 2023-03-05: qty 90, 90d supply, fill #1

## 2022-09-20 MED ORDER — SPIRONOLACTONE 25 MG PO TABS
25.0000 mg | ORAL_TABLET | Freq: Every day | ORAL | 2 refills | Status: DC
Start: 2022-09-20 — End: 2023-07-02
  Filled 2022-09-20 – 2022-11-10 (×2): qty 90, 90d supply, fill #0
  Filled 2023-03-05: qty 90, 90d supply, fill #1
  Filled 2023-05-28: qty 90, 90d supply, fill #2

## 2022-09-20 MED ORDER — CARVEDILOL 25 MG PO TABS
25.0000 mg | ORAL_TABLET | Freq: Two times a day (BID) | ORAL | 3 refills | Status: DC
Start: 2022-09-20 — End: 2023-07-02
  Filled 2022-09-20 – 2022-10-12 (×2): qty 120, 60d supply, fill #0
  Filled 2023-01-08: qty 120, 60d supply, fill #1
  Filled 2023-04-02: qty 120, 60d supply, fill #2
  Filled 2023-06-25 (×2): qty 120, 60d supply, fill #3

## 2022-09-20 MED ORDER — POTASSIUM CHLORIDE ER 8 MEQ PO TBCR
16.0000 meq | EXTENDED_RELEASE_TABLET | Freq: Two times a day (BID) | ORAL | 6 refills | Status: DC
Start: 2022-09-20 — End: 2023-07-02
  Filled 2022-09-20 – 2022-10-12 (×2): qty 120, 30d supply, fill #0
  Filled 2022-11-10: qty 120, 30d supply, fill #1
  Filled 2023-01-08: qty 120, 30d supply, fill #2
  Filled 2023-02-05: qty 120, 30d supply, fill #3
  Filled 2023-03-05 (×2): qty 120, 30d supply, fill #4
  Filled 2023-04-30 (×2): qty 120, 30d supply, fill #5
  Filled 2023-05-28: qty 120, 30d supply, fill #6

## 2022-09-20 MED ORDER — AMLODIPINE BESYLATE 10 MG PO TABS
10.0000 mg | ORAL_TABLET | Freq: Every day | ORAL | 1 refills | Status: DC
  Filled 2022-09-20: qty 60, 60d supply, fill #0
  Filled 2023-01-08: qty 60, 60d supply, fill #1
  Filled 2023-03-05 (×2): qty 60, 60d supply, fill #2

## 2022-09-20 MED ORDER — METFORMIN HCL 500 MG PO TABS
500.0000 mg | ORAL_TABLET | Freq: Two times a day (BID) | ORAL | 2 refills | Status: DC
Start: 2022-09-20 — End: 2023-07-02
  Filled 2022-09-20 – 2022-12-11 (×2): qty 180, 90d supply, fill #0
  Filled 2023-03-05 (×2): qty 180, 90d supply, fill #1
  Filled 2023-06-25 (×2): qty 180, 90d supply, fill #2

## 2022-09-20 MED ORDER — CLOPIDOGREL BISULFATE 75 MG PO TABS
75.0000 mg | ORAL_TABLET | Freq: Every day | ORAL | 2 refills | Status: DC
Start: 2022-09-20 — End: 2023-07-02
  Filled 2022-09-20 – 2022-11-10 (×2): qty 90, 90d supply, fill #0
  Filled 2023-03-05 (×2): qty 90, 90d supply, fill #1
  Filled 2023-05-28: qty 90, 90d supply, fill #2

## 2022-09-20 NOTE — Assessment & Plan Note (Signed)
Blood pressure not yet at goal we will add amlodipine 10 mg daily bring patient back short-term

## 2022-09-20 NOTE — Progress Notes (Signed)
Established Patient Office Visit  Subjective   Patient ID: Austin Mercer., male    DOB: 1950-06-15  Age: 72 y.o. MRN: [id number]  Chief Complaint  Patient presents with   Medication Refill   09/2021 Patient Presents today for follow up.  Austin Mercer is a 72 year old male with a history of Hypertension, Type 2 Diabetes, Chronic Systolic Heart Failure, and alcoholic cirrhosis presents to the clinic with foot swelling. Patient states he noticed the swelling this past week. Denies trauma, chest pain, shortness of breath. He states he has trouble adhering to his medications and on a weekly basis comes into the clinic to organize his pill container. Admits to occasional alcohol use. Denies tobacco use.  The patient has not been taking some of his morning medicines based on the pharmacy there is been doing his pill organizer they state that every week he comes in the a.m. medications have not been used  Blood Pressure today 178/84. Hemoglobin A1C 7.2. Patient did not take his medications today. Patient does not regularly check his blood sugar at home. He states he lost his glucose meter. I stressed the importance of medication adherence and checking his blood sugar to improve his chronic conditions.   Requesting medication refills.  Will need to check Kidney function and obtain urine for microalbuminuria.  7/18 Patient seen in return follow-up has had difficulty with being compliant with his blood pressure medications.  He does have a pill organizer but often forgets to take the evening dose.  Patient brings his pill organizer with him today and he has multiple afternoon doses of carvedilol not taken.  On arrival blood pressure is 163/84  Below are documentations when he has been to the pharmacy to pick up medications and blood pressures checked Patient seen by Tanna Furry Day, PharmD Candidate on 10/14/21 while they were picking up prescriptions at [facility] at [facility].    Blood pressure today was : 184/101 (first reading) HR: 68; 178/95 (second reading) HR: 68     Patient has an automated home blood pressure machine. Patient reports having not used the monitor yet.   Medication review was performed. They are not taking medications as prescribed. He reports forgetting to take medications some of the time - especially during the evenings. He goes to the pharmacy every week to get his medications packaged for him.   The following barriers to adherence were noted:  - Forgetting to take medications  - Questions/Concerns about medications. Patient was unaware of which medications of his were for blood pressure.   The following interventions were completed:  - Medications were reviewed  - Patient was educated on medications, including indication and administration  - Patient was educated on use of adherence strategies, like an alarm for the evenings  - Patient was educated on proper technique to check home blood pressure and reminded to bring home machine and readings to next provider appointment  - Patient was counseled on lifestyle modifications to improve blood pressure    As noted above the patient has difficulty with medication compliance and has multiple barriers not understanding what his medicines are for and forgetting to take afternoon doses  09/20/22 This patient is seen and examined for follow-up of hypertension and obesity.  Blood pressure on arrival 138/86 he does still take in some excess salt.  Has chronic pain in the right knee.  Patient needs follow-up labs.     Past Medical History:  Diagnosis Date  Diabetes (HCC)    Hypertension    Hypertensive urgency 03/04/2020   Stroke [facility])      Family History  Problem Relation Age of Onset   Diabetes Father      Social History   Socioeconomic History   Marital status: Divorced    Spouse name: Not on file   Number of children: 2   Years of education: Not on file    Highest education level: Not on file  Occupational History   Occupation: retired  Tobacco Use   Smoking status: Former    Types: Cigarettes    Quit date: 1973    Years since quitting: 51.4   Smokeless tobacco: Never  Vaping Use   Vaping Use: Never used  Substance and Sexual Activity   Alcohol use: Not Currently    Comment: Beer, Red wine   Drug use: Not Currently   Sexual activity: Not on file  Other Topics Concern   Not on file  Social History Narrative   Not on file   Social Determinants of Health   Financial Resource Strain: Low Risk  (07/13/2022)   Overall Financial Resource Strain (CARDIA)    Difficulty of Paying Living Expenses: Not hard at all  Food Insecurity: No Food Insecurity (07/13/2022)   Hunger Vital Sign    Worried About Running Out of Food in the Last Year: Never true    Ran Out of Food in the Last Year: Never true  Transportation Needs: No Transportation Needs (07/13/2022)   PRAPARE - Administrator, Civil Service (Medical): No    Lack of Transportation (Non-Medical): No  Physical Activity: Inactive (07/13/2022)   Exercise Vital Sign    Days of Exercise per Week: 0 days    Minutes of Exercise per Session: 0 min  Stress: Stress Concern Present (07/13/2022)   [employer] of Occupational Health - Occupational Stress Questionnaire    Feeling of Stress : To some extent  Social Connections: Not on file  Intimate Partner Violence: Not At Risk (05/17/2022)   Humiliation, Afraid, Rape, and Kick questionnaire    Fear of Current or Ex-Partner: No    Emotionally Abused: No    Physically Abused: No    Sexually Abused: No     Allergies  Allergen Reactions   Codeine Itching   Latex Itching     Outpatient Medications Prior to Visit  Medication Sig Dispense Refill   Accu-Chek Softclix Lancets lancets Use to check blood sugar twice daily 100 each 6   aspirin EC 81 MG tablet Take 1 tablet (81 mg total) by mouth daily. Swallow whole. (Patient taking  differently: Take 81 mg by mouth daily.) 30 tablet 12   Blood Glucose Monitoring Suppl (ACCU-CHEK GUIDE) w/Device KIT Use to check blood sugar twice daily 1 kit 0   Blood Pressure Monitoring (BLOOD PRESSURE KIT) DEVI Use to measure blood pressure 1 each 0   dapagliflozin propanediol (FARXIGA) 10 MG TABS tablet Take 1 tablet (10 mg total) by mouth once daily before breakfast. 90 tablet 1   famotidine (PEPCID) 20 MG tablet Take 1 tablet (20 mg total) by mouth daily. 90 tablet 1   furosemide (LASIX) 40 MG tablet Take 1 tablet (40 mg total) by mouth daily. 90 tablet 1   glucose blood (ACCU-CHEK GUIDE) test strip Use to check blood sugar twice daily 100 each 6   losartan (COZAAR) 100 MG tablet Take 1 tablet (100 mg total) by mouth once daily. 90 tablet 2  polyethylene glycol (MIRALAX / GLYCOLAX) 17 g packet Mix 17 g in 4-8 ounces of juice/water and take by mouth daily. 14 each 0   senna-docusate (SENOKOT-S) 8.6-50 MG tablet Take 2 tablets by mouth 2 (two) times daily. 60 tablet 0   thiamine (VITAMIN B-1) 100 MG tablet Take 1 tablet (100 mg total) by mouth daily. 30 tablet 0   traMADol (ULTRAM) 50 MG tablet Take 1 tablet (50 mg total) by mouth every 12 (twelve) hours as needed for severe pain. 10 tablet 0   atorvastatin (LIPITOR) 20 MG tablet Take 1 tablet (20 mg total) by mouth daily. 90 tablet 1   carvedilol (COREG) 25 MG tablet Take 1 tablet (25 mg total) by mouth 2 (two) times daily with a meal. 60 tablet 3   clopidogrel (PLAVIX) 75 MG tablet Take 1 tablet (75 mg total) by mouth daily. 90 tablet 0   metFORMIN (GLUCOPHAGE) 500 MG tablet Take 1 tablet (500 mg total) by mouth 2 (two) times daily with a meal. 180 tablet 0   potassium chloride (KLOR-CON) 8 MEQ tablet Take 2 tablets (16 mEq total) by mouth 2 (two) times daily. 120 tablet 6   spironolactone (ALDACTONE) 25 MG tablet Take 1 tablet (25 mg total) by mouth once daily. 90 tablet 0   No facility-administered medications prior to visit.     Review of Systems  Constitutional: Negative.  Negative for chills, diaphoresis, fever, malaise/fatigue and weight loss.       Weight gain is significant from edema  HENT: Negative.  Negative for congestion, ear discharge, ear pain, hearing loss, nosebleeds, sore throat and tinnitus.   Eyes: Negative.  Negative for blurred vision, double vision, photophobia and discharge.  Respiratory: Negative.  Negative for cough, hemoptysis, sputum production, shortness of breath, wheezing and stridor.        No excess mucus  Cardiovascular:  Negative for chest pain, palpitations, orthopnea, claudication, leg swelling and PND.  Gastrointestinal: Negative.  Negative for abdominal pain, blood in stool, constipation, diarrhea, heartburn, melena, nausea and vomiting.  Genitourinary: Negative.  Negative for dysuria, flank pain, frequency, hematuria and urgency.  Musculoskeletal:  Positive for joint pain (knee). Negative for back pain, falls, myalgias and neck pain.  Skin: Negative.  Negative for itching and rash.  Neurological: Negative.  Negative for dizziness, tingling, tremors, sensory change, speech change, focal weakness, seizures, loss of consciousness, weakness and headaches.  Endo/Heme/Allergies: Negative.  Negative for environmental allergies and polydipsia. Does not bruise/bleed easily.  Psychiatric/Behavioral:  Negative for depression, hallucinations, memory loss, substance abuse and suicidal ideas. The patient is not nervous/anxious and does not have insomnia.   All other systems reviewed and are negative.     Objective:     BP 138/86   Pulse 62   Wt (!) 342 lb 9.6 oz (155.4 kg)   SpO2 100%   BMI 43.99 kg/m  BP Readings from Last 3 Encounters:  09/20/22 138/86  07/20/22 (!) 148/75  07/03/22 (!) 140/74   Wt Readings from Last 3 Encounters:  09/20/22 (!) 342 lb 9.6 oz (155.4 kg)  07/20/22 (!) 337 lb (152.9 kg)  07/13/22 (!) 337 lb (152.9 kg)      Physical Exam Vitals reviewed.   Constitutional:      Appearance: Normal appearance. He is well-developed. He is obese. He is not diaphoretic.  HENT:     Head: Normocephalic and atraumatic.     Nose: No nasal deformity, septal deviation, mucosal edema or rhinorrhea.  Right Sinus: No maxillary sinus tenderness or frontal sinus tenderness.     Left Sinus: No maxillary sinus tenderness or frontal sinus tenderness.     Mouth/Throat:     Mouth: Mucous membranes are moist.     Pharynx: Oropharynx is clear. No oropharyngeal exudate.  Eyes:     General: No scleral icterus.    Conjunctiva/sclera: Conjunctivae normal.     Pupils: Pupils are equal, round, and reactive to light.  Neck:     Thyroid: No thyromegaly.     Vascular: No carotid bruit or JVD.     Trachea: Trachea normal. No tracheal tenderness or tracheal deviation.  Cardiovascular:     Rate and Rhythm: Normal rate and regular rhythm.     Chest Wall: PMI is not displaced.     Pulses: Normal pulses. No decreased pulses.     Heart sounds: Normal heart sounds, S1 normal and S2 normal. Heart sounds not distant. No murmur heard.    No systolic murmur is present.     No diastolic murmur is present.     No friction rub. No gallop. No S3 or S4 sounds.  Pulmonary:     Effort: Pulmonary effort is normal. No tachypnea, accessory muscle usage or respiratory distress.     Breath sounds: Normal breath sounds. No stridor. No decreased breath sounds, wheezing, rhonchi or rales.  Chest:     Chest wall: No tenderness.  Abdominal:     General: Bowel sounds are normal. There is no distension.     Palpations: Abdomen is soft. Abdomen is not rigid.     Tenderness: There is no abdominal tenderness. There is no guarding or rebound.  Musculoskeletal:        General: Normal range of motion.     Cervical back: Normal range of motion and neck supple. No edema, erythema or rigidity. No muscular tenderness. Normal range of motion.     Right lower leg: No edema.     Left lower leg: No  edema.  Lymphadenopathy:     Head:     Right side of head: No submental or submandibular adenopathy.     Left side of head: No submental or submandibular adenopathy.     Cervical: No cervical adenopathy.  Skin:    General: Skin is warm and dry.     Coloration: Skin is not pale.     Findings: No rash.     Nails: There is no clubbing.  Neurological:     Mental Status: He is alert and oriented to person, place, and time.     Sensory: No sensory deficit.  Psychiatric:        Speech: Speech normal.        Behavior: Behavior normal.      No results found for any visits on 09/20/22.   Last CBC Lab Results  Component Value Date   WBC 7.7 06/22/2022   HGB 12.9 (L) 06/22/2022   HCT 39.2 06/22/2022   MCV 78 (L) 06/22/2022   MCH 25.5 (L) 06/22/2022   RDW 14.4 06/22/2022   PLT 144 (L) 06/22/2022   Last metabolic panel Lab Results  Component Value Date   GLUCOSE 119 (H) 06/22/2022   NA 136 06/22/2022   K 5.0 06/22/2022   CL 104 06/22/2022   CO2 19 (L) 06/22/2022   BUN 30 (H) 06/22/2022   CREATININE 1.76 (H) 06/22/2022   EGFR 41 (L) 06/22/2022   CALCIUM 11.6 (H) 06/22/2022   PHOS 2.9 10/18/2021  PROT 7.4 06/22/2022   ALBUMIN 4.1 06/22/2022   LABGLOB 3.3 06/22/2022   AGRATIO 1.2 06/22/2022   BILITOT 0.5 06/22/2022   ALKPHOS 56 06/22/2022   AST 13 06/22/2022   ALT 10 06/22/2022   ANIONGAP 9 05/19/2022   Last lipids Lab Results  Component Value Date   CHOL 109 05/18/2022   HDL 29 (L) 05/18/2022   LDLCALC 59 05/18/2022   TRIG 106 05/18/2022   CHOLHDL 3.8 05/18/2022   Last hemoglobin A1c Lab Results  Component Value Date   HGBA1C 6.2 (H) 05/19/2022   Last thyroid functions Lab Results  Component Value Date   TSH 3.060 11/27/2017   Last vitamin D Lab Results  Component Value Date   VD25OH 28.4 (L) 11/23/2017   Last vitamin B12 and Folate No results found for: "VITAMINB12", "FOLATE"  The ASCVD Risk score (Arnett DK, et al., 2019) failed to calculate for  the following reasons:   The patient has a prior MI or stroke diagnosis    Assessment & Plan:   Problem List Items Addressed This Visit       Cardiovascular and Mediastinum   Essential hypertension    Blood pressure not yet at goal we will add amlodipine 10 mg daily bring patient back short-term      Relevant Medications   amLODipine (NORVASC) 10 MG tablet   atorvastatin (LIPITOR) 20 MG tablet   carvedilol (COREG) 25 MG tablet   potassium chloride (KLOR-CON) 8 MEQ tablet   spironolactone (ALDACTONE) 25 MG tablet   Other Relevant Orders   BMP8+eGFR   Chronic systolic heart failure (HCC)   Relevant Medications   amLODipine (NORVASC) 10 MG tablet   atorvastatin (LIPITOR) 20 MG tablet   carvedilol (COREG) 25 MG tablet   spironolactone (ALDACTONE) 25 MG tablet   Other Relevant Orders   BMP8+eGFR   RESOLVED: CVA (cerebral vascular accident) (HCC)   Relevant Medications   amLODipine (NORVASC) 10 MG tablet   atorvastatin (LIPITOR) 20 MG tablet   carvedilol (COREG) 25 MG tablet   clopidogrel (PLAVIX) 75 MG tablet   spironolactone (ALDACTONE) 25 MG tablet     Endocrine   Controlled type 2 diabetes mellitus without complication, without long-term current use of insulin (HCC) - Primary    Diabetes appears close to goal continue with current program      Relevant Medications   atorvastatin (LIPITOR) 20 MG tablet   metFORMIN (GLUCOPHAGE) 500 MG tablet   Other Relevant Orders   Ambulatory referral to Dentistry   Microalbuminuria due to type 2 diabetes mellitus (HCC)    Has an ARB in place      Relevant Medications   atorvastatin (LIPITOR) 20 MG tablet   metFORMIN (GLUCOPHAGE) 500 MG tablet     Other   Moderate episode of recurrent major depressive disorder (HCC)   Other Visit Diagnoses     Mixed hyperlipidemia       Relevant Medications   amLODipine (NORVASC) 10 MG tablet   atorvastatin (LIPITOR) 20 MG tablet   carvedilol (COREG) 25 MG tablet   spironolactone  (ALDACTONE) 25 MG tablet   Controlled type 2 diabetes mellitus with microalbuminuria, without long-term current use of insulin (HCC)       Relevant Medications   atorvastatin (LIPITOR) 20 MG tablet   metFORMIN (GLUCOPHAGE) 500 MG tablet   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    Shan Levans, MD

## 2022-09-20 NOTE — Patient Instructions (Addendum)
Labs today include a metabolic panel check kidney function  Start amlodipine 1 pill daily for blood pressure and no other medication changes refills given  Return to see our clinical pharmacist 1 month to check your blood pressure then see Dr. Delford Field in 3 months  Referral to dentist will be made

## 2022-09-20 NOTE — Assessment & Plan Note (Signed)
Has an ARB in place

## 2022-09-20 NOTE — Assessment & Plan Note (Signed)
Diabetes appears close to goal continue with current program

## 2022-09-21 ENCOUNTER — Telehealth: Payer: Self-pay

## 2022-09-21 ENCOUNTER — Other Ambulatory Visit: Payer: Self-pay | Admitting: Critical Care Medicine

## 2022-09-21 LAB — BMP8+EGFR
BUN/Creatinine Ratio: 13 (ref 10–24)
BUN: 19 mg/dL (ref 8–27)
CO2: 18 mmol/L — ABNORMAL LOW (ref 20–29)
Calcium: 11.3 mg/dL — ABNORMAL HIGH (ref 8.6–10.2)
Chloride: 105 mmol/L (ref 96–106)
Creatinine, Ser: 1.5 mg/dL — ABNORMAL HIGH (ref 0.76–1.27)
Glucose: 162 mg/dL — ABNORMAL HIGH (ref 70–99)
Potassium: 4.3 mmol/L (ref 3.5–5.2)
Sodium: 139 mmol/L (ref 134–144)
eGFR: 50 mL/min/{1.73_m2} — ABNORMAL LOW (ref >59)

## 2022-09-21 NOTE — Telephone Encounter (Signed)
Called patient home, person on phone stated that he was not home and she will tell him to call back   Information sent to nurse pool

## 2022-09-21 NOTE — Telephone Encounter (Signed)
-----   Message from Storm Frisk, MD sent at 09/21/2022  5:41 AM EDT ----- Let pt know labs stable but calcium is high , he needs labs to check his thyroid I put in order when he sees luke to get this drawn

## 2022-09-21 NOTE — Progress Notes (Signed)
Let pt know labs stable but calcium is high , he needs labs to check his thyroid I put in order when he sees luke to get this drawn

## 2022-09-22 ENCOUNTER — Ambulatory Visit: Payer: Self-pay

## 2022-09-22 NOTE — Patient Outreach (Signed)
  Care Coordination   Initial Visit Note   09/22/2022 Name: Austin Mercer. MRN: [id number] DOB: 02-Mar-1951  Austin Mercer. is a 72 y.o. year old male who sees Storm Frisk, MD for primary care. I spoke with  [name]. by phone today.  What matters to the patients health and wellness today?  Patient will continue to take his medications as directed.     Goals Addressed             This Visit's Progress    Take blood pressure meds as prescribed       Care Coordination Interventions: Evaluation of current treatment plan related to hypertension self management and patient's adherence to plan as established by provider Reviewed medications with patient and discussed importance of compliance Counseled on the importance of exercise goals with target of 150 minutes per week Educated patient about the PREP program, patient declines to participate at this time  Reviewed next scheduled upcoming visit with Allie Dimmer D to assist with medication management scheduled for 10/23/22 @11 :00 AM Informed patient of elevated Calcium and need for further evaluation per Dr. Delford Field, advised order for thyroid function has been placed to be checked during pharmacist visit     Interventions Today    Flowsheet Row Most Recent Value  Chronic Disease   Chronic disease during today's visit Hypertension (HTN)  General Interventions   General Interventions Discussed/Reviewed General Interventions Discussed, General Interventions Reviewed, Labs, Doctor Visits  Doctor Visits Discussed/Reviewed Doctor Visits Reviewed, Doctor Visits Discussed, PCP  Exercise Interventions   Exercise Discussed/Reviewed Exercise Discussed, Exercise Reviewed, Physical Activity  Physical Activity Discussed/Reviewed Physical Activity Discussed, Physical Activity Reviewed, Types of exercise, PREP  Education Interventions   Education Provided Provided Education  Provided Verbal Education On Labs, When to see the doctor,  Exercise, Medication, Nutrition  Labs Reviewed Hgb A1c  Nutrition Interventions   Nutrition Discussed/Reviewed Nutrition Discussed, Nutrition Reviewed, Fluid intake  Pharmacy Interventions   Pharmacy Dicussed/Reviewed Pharmacy Topics Discussed, Pharmacy Topics Reviewed, Medications and their functions, Medication Adherence          SDOH assessments and interventions completed:  No     Care Coordination Interventions:  Yes, provided   Follow up plan: Follow up call scheduled for 11/22/22 @12 :30 PM     Encounter Outcome:  Pt. Visit Completed

## 2022-09-22 NOTE — Patient Instructions (Signed)
Visit Information  Thank you for taking time to visit with me today. Please don't hesitate to contact me if I can be of assistance to you.   Following are the goals we discussed today:   Goals Addressed             This Visit's Progress    Take blood pressure meds as prescribed       Care Coordination Interventions: Evaluation of current treatment plan related to hypertension self management and patient's adherence to plan as established by provider Reviewed medications with patient and discussed importance of compliance Counseled on the importance of exercise goals with target of 150 minutes per week Educated patient about the PREP program, patient declines to participate at this time  Reviewed next scheduled upcoming visit with Allie Dimmer D to assist with medication management scheduled for 10/23/22 @11 :00 AM Informed patient of elevated Calcium and need for further evaluation per Dr. Delford Field, advised order for thyroid function has been placed to be checked during pharmacist visit         Our next appointment is by telephone on 11/22/22 at 12:30 PM  Please call the care guide team at [phone] if you need to cancel or reschedule your appointment.   If you are experiencing a Mental Health or Behavioral Health Crisis or need someone to talk to, please call 1-800-273-TALK (toll free, 24 hour hotline)  The patient verbalized understanding of instructions, educational materials, and care plan provided today and DECLINED offer to receive copy of patient instructions, educational materials, and care plan.   Delsa Sale, RN, BSN, CCM Care Management Coordinator [facility] Care Management  Direct Phone: [phone]

## 2022-09-25 ENCOUNTER — Telehealth: Payer: Self-pay | Admitting: Licensed Clinical Social Worker

## 2022-09-25 NOTE — Patient Outreach (Signed)
  Care Coordination   09/25/2022 Name: Austin Mercer. MRN: [id number] DOB: 04-06-1950   Care Coordination Outreach Attempts:  An unsuccessful telephone outreach was attempted today to offer the patient information about available care coordination services.  Follow Up Plan:  Additional outreach attempts will be made to offer the patient care coordination information and services.   Encounter Outcome:  No Answer   Care Coordination Interventions:  No, not indicated    Jenel Lucks, MSW, LCSW [facility] Care Management [place]  [email or web address] Phone [phone] 3:15 PM

## 2022-09-28 ENCOUNTER — Telehealth: Payer: Self-pay | Admitting: Licensed Clinical Social Worker

## 2022-09-28 ENCOUNTER — Other Ambulatory Visit: Payer: Self-pay | Admitting: Critical Care Medicine

## 2022-09-28 ENCOUNTER — Other Ambulatory Visit: Payer: Self-pay

## 2022-09-28 DIAGNOSIS — N529 Male erectile dysfunction, unspecified: Secondary | ICD-10-CM

## 2022-09-28 NOTE — Patient Outreach (Signed)
  Care Coordination   Follow Up Visit Note   09/26/2022 Name: Austin Mercer. MRN: [id number] DOB: 04-Jul-1950  Austin Mercer. is a 72 y.o. year old male who sees Storm Frisk, MD for primary care. I spoke with  [name]. by phone today.  What matters to the patients health and wellness today?  Transportation    Goals Addressed             This Visit's Progress    Obtain Supportive Resources-Transportation/Housing/Caregiver Fatigue   On track    Activities and task to complete in order to accomplish goals.   Keep all upcoming appointments discussed today. Discuss with providers about your interest in driving and residing alone to obtain reccommendations Continue with compliance of taking medication prescribed by Doctor Implement healthy coping skills discussed to assist with management of symptoms Continue working with [facility] care team to assist with goals identified          SDOH assessments and interventions completed:  No     Care Coordination Interventions:  Yes, provided  Interventions Today    Flowsheet Row Most Recent Value  Chronic Disease   Chronic disease during today's visit Hypertension (HTN), Diabetes, Other  [MDD]  General Interventions   General Interventions Discussed/Reviewed General Interventions Reviewed, [employer] has not heard update from application with [employer] [place].]  Doctor Visits Discussed/Reviewed Doctor Visits Reviewed  Mental Health Interventions   Mental Health Discussed/Reviewed Mental Health Reviewed  Pharmacy Interventions   Pharmacy Dicussed/Reviewed Pharmacy Topics Reviewed, Medication Adherence     ' Follow up plan: Follow up call scheduled for 2-4 weeks    Encounter Outcome:  Pt. Visit Completed   Jenel Lucks, MSW, LCSW [facility] Care Management [facility]  [facility] [email or web address] Phone [phone] 10:39 AM

## 2022-09-28 NOTE — Patient Instructions (Signed)
Visit Information  Thank you for taking time to visit with me today. Please don't hesitate to contact me if I can be of assistance to you.   Following are the goals we discussed today:   Goals Addressed             This Visit's Progress    Obtain Supportive Resources-Transportation/Housing/Caregiver Fatigue   On track    Activities and task to complete in order to accomplish goals.   Keep all upcoming appointments discussed today. Discuss with providers about your interest in driving and residing alone to obtain reccommendations Continue with compliance of taking medication prescribed by Doctor Implement healthy coping skills discussed to assist with management of symptoms Continue working with [facility] care team to assist with goals identified          Please call the care guide team at [phone] if you need to cancel or reschedule your appointment.   If you are experiencing a Mental Health or Behavioral Health Crisis or need someone to talk to, please call the Suicide and Crisis Lifeline: 988 call 911   The patient verbalized understanding of instructions, educational materials, and care plan provided today and DECLINED offer to receive copy of patient instructions, educational materials, and care plan.   Jenel Lucks, MSW, LCSW [facility] Care Management [place]  [facility] [email or web address] Phone [phone] 10:39 AM

## 2022-10-12 ENCOUNTER — Other Ambulatory Visit: Payer: Self-pay

## 2022-10-23 ENCOUNTER — Ambulatory Visit: Payer: Medicare PPO | Admitting: Pharmacist

## 2022-10-25 NOTE — Patient Outreach (Signed)
  Care Coordination   Documentation  Note   10/25/2022 Name: Austin Mercer. MRN: [id number] DOB: [birth year]  Austin Mercer. is a 72 y.o. year old male who sees Storm Frisk, MD for primary care. I  collaborated with patients primary care providers office who indicates the patient has not yet been approved for [employer]. Collaboration with Lorretta Harp requesting feedback on the status of patients application.  What matters to the patients health and wellness today?  SW did not speak with the patient today.    SDOH assessments and interventions completed:  No     Care Coordination Interventions:  Yes, provided   Interventions Today    Flowsheet Row Most Recent Value  Chronic Disease   Chronic disease during today's visit Hypertension (HTN)  General Interventions   General Interventions Discussed/Reviewed [employer], Communication with  [employer] with Lorretta Harp with [place] [facility] to follow up on the status of patients application]  Communication with PCP/Specialists        Follow up plan:  SW will continue to follow.    Encounter Outcome:  Pt. Visit Completed   Bevelyn Ngo, BSW, CDP Social Worker, Certified Dementia Practitioner [facility] Care Management  Care Coordination [phone]

## 2022-11-01 ENCOUNTER — Ambulatory Visit: Payer: Self-pay

## 2022-11-01 NOTE — Patient Outreach (Signed)
  Care Coordination   Follow Up Visit Note   11/01/2022 Name: Austin Mercer. MRN: [id number] DOB: 1950-11-28  Austin Mercer. is a 72 y.o. year old male who sees Austin Frisk, MD for primary care. I  spoke with patients friend Austin Mercer to follow up on care coordination needs.  What matters to the patients health and wellness today?  Obtain transportation services offered by [facility] Access  SDOH assessments and interventions completed:  No     Care Coordination Interventions:  Yes, provided   Interventions Today    Flowsheet Row Most Recent Value  Chronic Disease   Chronic disease during today's visit Hypertension (HTN)  General Interventions   General Interventions Discussed/Reviewed [employer], Communication with  [employer] with Austin Mercer with [employer] who advises pt is approved to use [employer] for transportation]  Education Interventions   Education Provided Provided Education  Provided Engineer, [employer] provided on how to [employer],  mailed program information to home address]        Follow up plan:  SW will follow up with the patient over the next 2 weeks to confirm receipt of mailed resource    Encounter Outcome:  Pt. Visit Completed   Austin Mercer, Austin Mercer, CDP Social Worker, Certified Dementia Practitioner [facility] Care Management  Care Coordination [phone]

## 2022-11-01 NOTE — Patient Instructions (Signed)
Visit Information  Thank you for taking time to visit with me today. Please don't hesitate to contact me if I can be of assistance to you.   Following are the goals we discussed today:  - Review mailed resource information - Contact [employer] to arrange transportation as needed   Our next appointment is by telephone on 8/14 at 12:30 pm  Please call the care guide team at [phone] if you need to cancel or reschedule your appointment.   If you are experiencing a Mental Health or Behavioral Health Crisis or need someone to talk to, please call 1-800-273-TALK (toll free, 24 hour hotline) go to [facility] [address], [place] [phone]) call 911  The patient verbalized understanding of instructions, educational materials, and care plan provided today and DECLINED offer to receive copy of patient instructions, educational materials, and care plan.   Bevelyn Ngo, BSW, CDP Social Worker, Certified Dementia Practitioner [facility] Care Management  Care Coordination [phone]

## 2022-11-07 ENCOUNTER — Ambulatory Visit: Payer: Medicare PPO | Admitting: Orthopaedic Surgery

## 2022-11-08 ENCOUNTER — Ambulatory Visit: Payer: Medicare PPO | Admitting: Physician Assistant

## 2022-11-08 ENCOUNTER — Encounter: Payer: Self-pay | Admitting: Physician Assistant

## 2022-11-08 DIAGNOSIS — M1711 Unilateral primary osteoarthritis, right knee: Secondary | ICD-10-CM | POA: Diagnosis not present

## 2022-11-08 MED ORDER — LIDOCAINE HCL 1 % IJ SOLN
3.0000 mL | INTRAMUSCULAR | Status: AC | PRN
Start: 2022-11-08 — End: 2022-11-08
  Administered 2022-11-08: 3 mL

## 2022-11-08 MED ORDER — METHYLPREDNISOLONE ACETATE 40 MG/ML IJ SUSP
40.0000 mg | INTRAMUSCULAR | Status: AC | PRN
Start: 2022-11-08 — End: 2022-11-08
  Administered 2022-11-08: 40 mg via INTRA_ARTICULAR

## 2022-11-08 NOTE — Progress Notes (Addendum)
Office Visit Note   Patient: Austin Mercer.           Date of Birth: Nov 02, 1950           MRN: [id number] Visit Date: 11/08/2022              Requested by: Storm Frisk, MD [address] [place],  [place] [address] PCP: Storm Frisk, MD  Chief Complaint  Patient presents with   Right Knee - Pain      HPI: Patient is a pleasant 72 year old gentleman who comes in today requesting an injection into his right knee.  He has a history of arthritis and is normally followed by Dr.Xu.  He got about 2 months relief from the and steroid injection.  He has had about a month month return of his symptoms denies any injuries rates his pain as mild to moderate  Assessment & Plan: Visit Diagnoses: Osteoarthritis right knee  Plan: [name] forward with a steroid injection he can follow-up with Dr. Roda Shutters or Mardella Layman  Follow-Up Instructions: No follow-ups on file.   Ortho Exam  Patient is alert, oriented, no adenopathy, well-dressed, normal affect, normal respiratory effort. Right knee no erythema no effusion compartments are soft and compressible no cellulitis he is tender more of the medial and lateral joint line previous x-rays have shown degenerative joint disease  Imaging: No results found. No images are attached to the encounter.  Labs: Lab Results  Component Value Date   HGBA1C 6.2 (H) 05/19/2022   HGBA1C 6.4 (H) 01/04/2022   HGBA1C 7.2 (A) 09/13/2021     Lab Results  Component Value Date   ALBUMIN 4.1 06/22/2022   ALBUMIN 3.9 05/16/2022   ALBUMIN 2.8 (L) 05/11/2022    Lab Results  Component Value Date   MG 1.9 05/16/2022   MG 1.7 12/30/2011   Lab Results  Component Value Date   VD25OH 28.4 (L) 11/23/2017    No results found for: "PREALBUMIN"    Latest Ref Rng & Units 06/22/2022   11:08 AM 05/18/2022    6:26 AM 05/16/2022    3:47 PM  CBC EXTENDED  WBC 3.4 - 10.8 x10E3/uL 7.7  6.0  7.3   RBC 4.14 - 5.80 x10E6/uL 5.05  5.01  5.04   Hemoglobin 13.0  - 17.7 g/dL 19.1  47.8  29.5   HCT 37.5 - 51.0 % 39.2  40.3  41.7   Platelets 150 - 450 x10E3/uL 144  135  141   NEUT# 1.4 - 7.0 x10E3/uL 2.4   3.2   Lymph# 0.7 - 3.1 x10E3/uL 3.7   3.0      There is no height or weight on file to calculate BMI.  Orders:  No orders of the defined types were placed in this encounter.  No orders of the defined types were placed in this encounter.    Procedures: Large Joint Inj: R knee on 11/08/2022 2:53 PM Details: 25 G needle, anteromedial approach Medications: 3 mL lidocaine 1 %; 40 mg methylPREDNISolone acetate 40 MG/ML   Clinical Data: No additional findings.  ROS:  All other systems negative, except as noted in the HPI. Review of Systems  Objective: Vital Signs: There were no vitals taken for this visit.  Specialty Comments:  No specialty comments available.  PMFS History: Patient Active Problem List   Diagnosis Date Noted   History of stroke 05/11/2022   Microalbuminuria due to type 2 diabetes mellitus (HCC) 10/21/2021   Allergic rhinitis  10/18/2021   Hypophosphatemia 09/14/2021   Liver cirrhosis (HCC) 09/12/2021   Moderate episode of recurrent major depressive disorder (HCC) 09/09/2020   Toenail fungus 04/21/2020   Chronic systolic heart failure (HCC) 03/11/2020   First degree AV block 03/04/2020   PVC (premature ventricular contraction) 03/04/2020   Depression 03/04/2020   History of alcohol use disorder 04/26/2018   Erectile dysfunction of organic origin 04/26/2018   Positive for macroalbuminuria 01/27/2018   Hypercalcemia 01/24/2018   Controlled type 2 diabetes mellitus without complication, without long-term current use of insulin (HCC) 11/23/2017   Essential hypertension 11/23/2017   Primary osteoarthritis of right knee 11/23/2017   Class 3 severe obesity due to excess calories with serious comorbidity and body mass index (BMI) of 45.0 to 49.9 in adult (HCC) 11/23/2017   Age-related cataract of right eye 11/23/2017    Hepatitis C virus infection cured after antiviral drug therapy 11/23/2017   Chronic, continuous use of opioids 11/23/2017   Past Medical History:  Diagnosis Date   Diabetes (HCC)    Hypertension    Hypertensive urgency 03/04/2020   Stroke [facility])     Family History  Problem Relation Age of Onset   Diabetes Father     Past Surgical History:  Procedure Laterality Date   ANKLE FRACTURE SURGERY     Social History   Occupational History   Occupation: retired  Tobacco Use   Smoking status: Former    Current packs/day: 0.00    Types: Cigarettes    Quit date: 1973    Years since quitting: 51.6   Smokeless tobacco: Never  Vaping Use   Vaping status: Never Used  Substance and Sexual Activity   Alcohol use: Not Currently    Comment: Beer, Red wine   Drug use: Not Currently   Sexual activity: Not on file

## 2022-11-10 ENCOUNTER — Other Ambulatory Visit: Payer: Self-pay

## 2022-11-15 ENCOUNTER — Ambulatory Visit: Payer: Self-pay

## 2022-11-15 NOTE — Patient Outreach (Signed)
  Care Coordination   Follow Up Visit Note   11/15/2022 Name: Austin Mercer. MRN: [id number] DOB: 05-Aug-1950  Austin Mercer. is a 72 y.o. year old male who sees Austin Frisk, MD for primary care. I  spoke with Austin Mercer to follow up on patients care coordination needs.  What matters to the patients health and wellness today?  To learn how to access [employer] for transportation services.   SDOH assessments and interventions completed:  No     Care Coordination Interventions:  Yes, provided   Interventions Today    Flowsheet Row Most Recent Value  Chronic Disease   Chronic disease during today's visit Hypertension (HTN)  General Interventions   General Interventions Discussed/Reviewed General Interventions Reviewed, [employer]  [Determined patient has yet to receive information on [employer] via mail. Re-sent educational materials to patients home address]        Follow up plan:  SW will continue to follow    Encounter Outcome:  Pt. Visit Completed   Bevelyn Ngo, Kenard Gower, CDP Social Worker, Certified Dementia Practitioner [facility] Care Management  Care Coordination [phone]

## 2022-11-15 NOTE — Patient Instructions (Signed)
Visit Information  Thank you for taking time to visit with me today. Please don't hesitate to contact me if I can be of assistance to you.   Following are the goals we discussed today:  - Review mailed resource information - [employer] directly to arrange transportation services   If you are experiencing a Mental Health or Behavioral Health Crisis or need someone to talk to, please go to [facility] [address] [place] [phone]) call 911  The patient verbalized understanding of instructions, educational materials, and care plan provided today and DECLINED offer to receive copy of patient instructions, educational materials, and care plan.   Bevelyn Ngo, BSW, CDP Social Worker, Certified Dementia Practitioner [facility] Care Management  Care Coordination [phone]

## 2022-11-22 ENCOUNTER — Ambulatory Visit: Payer: Self-pay

## 2022-11-22 NOTE — Patient Outreach (Signed)
  Care Coordination   Follow Up Visit Note   11/22/2022 Name: Austin Mercer. MRN: [id number] DOB: Oct 30, 1950  Austin Mercer. is a 72 y.o. year old male who sees Storm Frisk, MD for primary care. I  spoke with the patients friend and caregiver [name] by phone to follow up on care coordination needs.  What matters to the patients health and wellness today?  Obtain hard copy of educational materials about how to use [employer] transportation services. Patient is aware he is approved to use this service.   SDOH assessments and interventions completed:  No     Care Coordination Interventions:  Yes, provided   Interventions Today    Flowsheet Row Most Recent Value  Chronic Disease   Chronic disease during today's visit Hypertension (HTN)  General Interventions   General Interventions Discussed/Reviewed General Interventions Reviewed, [employer]  [Determined patient has yet to receive mailed educational materials about how to arrange transportation using [employer]. Pt did receive information via email and will await hard copy]        Follow up plan:  SW will continue to follow.    Encounter Outcome:  Pt. Visit Completed   Bevelyn Ngo, BSW, CDP Social Worker, Certified Dementia Practitioner [facility] Care Management  Care Coordination [phone]

## 2022-11-22 NOTE — Patient Instructions (Signed)
Visit Information  Thank you for taking time to visit with me today. Please don't hesitate to contact me if I can be of assistance to you.   Following are the goals we discussed today:  - Review mailed educational materials once received  If you are experiencing a Mental Health or Behavioral Health Crisis or need someone to talk to, please call 1-800-273-TALK (toll free, 24 hour hotline) go to [facility] [address], [place] [phone]) call 911  The patient verbalized understanding of instructions, educational materials, and care plan provided today and DECLINED offer to receive copy of patient instructions, educational materials, and care plan.   Bevelyn Ngo, BSW, CDP Social Worker, Certified Dementia Practitioner [facility] Care Management  Care Coordination [phone]

## 2022-11-22 NOTE — Patient Outreach (Signed)
  Care Coordination   11/22/2022 Name: Austin Mercer. MRN: [id number] DOB: 06-Mar-1951   Care Coordination Outreach Attempts:  An unsuccessful telephone outreach was attempted for a scheduled appointment today.  Follow Up Plan:  Additional outreach attempts will be made to offer the patient care coordination information and services.   Encounter Outcome:  No Answer   Care Coordination Interventions:  No, not indicated    Delsa Sale, RN, BSN, CCM Care Management Coordinator [facility] Care Management  Direct Phone: [phone]

## 2022-12-07 ENCOUNTER — Telehealth: Payer: Self-pay | Admitting: *Deleted

## 2022-12-07 NOTE — Progress Notes (Signed)
  Care Coordination Note  12/07/2022 Name: Austin Mercer. MRN: [id number] DOB: 01-Jul-1950  Austin Mercer. is a 72 y.o. year old male who is a primary care patient of Storm Frisk, MD and is actively engaged with the care management team. I reached out to [employer]. by phone today to assist with re-scheduling a follow up visit with the RN Case Manager  Follow up plan: Telephone appointment with care management team member scheduled for:12/20/22  [facility] Coordination Care Guide  Direct Dial: [phone]

## 2022-12-07 NOTE — Progress Notes (Signed)
  Care Coordination Note  12/07/2022 Name: Austin Mercer. MRN: [id number] DOB: 27-Dec-1950  Austin Mercer. is a 72 y.o. year old male who is a primary care patient of Storm Frisk, MD and is actively engaged with the care management team. I reached out to [employer]. by phone today to assist with re-scheduling a follow up visit with the RN Case Manager  Follow up plan: Unsuccessful telephone outreach attempt made. A HIPAA compliant phone message was left for the patient providing contact information and requesting a return call.   [facility]  Care Coordination Care Guide  Direct Dial: [phone]

## 2022-12-11 ENCOUNTER — Other Ambulatory Visit: Payer: Self-pay

## 2022-12-12 ENCOUNTER — Ambulatory Visit: Payer: Medicare PPO | Admitting: Podiatry

## 2022-12-18 ENCOUNTER — Ambulatory Visit: Payer: Self-pay

## 2022-12-18 NOTE — Patient Outreach (Signed)
Care Coordination   Follow Up Visit Note   12/18/2022 Name: Austin Mercer. MRN: [id number] DOB: [birth year]  Austin Kwamel Regen. is a 72 y.o. year old male who sees Storm Frisk, MD for primary care. I  spoke with patients friend and caregiver [name] by phone to follow up on care coordination needs.  What matters to the patients health and wellness today?  Identify resources to assist with transportation   SDOH assessments and interventions completed:  No     Care Coordination Interventions:  Yes, provided   Interventions Today    Flowsheet Row Most Recent Value  Chronic Disease   Chronic disease during today's visit Hypertension (HTN), Diabetes  General Interventions   General Interventions Discussed/Reviewed General Interventions Reviewed, [employer] receipt of [employer]. Patient understands how to use [facility] for transportation assistance]        Follow up plan: No further intervention required.   Encounter Outcome:  Patient Visit Completed   Bevelyn Ngo, BSW, CDP [facility]  [facility], [facility] Social Worker Direct Dial: [phone]  Fax: [phone]

## 2022-12-18 NOTE — Patient Instructions (Signed)
Visit Information  Thank you for taking time to visit with me today. Please don't hesitate to contact me if I can be of assistance to you.   Following are the goals we discussed today:  - Utilize [employer] for transportation to future appointments  If you are experiencing a Mental Health or Behavioral Health Crisis or need someone to talk to, please call 1-800-273-TALK (toll free, 24 hour hotline) go to [facility] [address] [place] [phone]) call 911  The patient verbalized understanding of instructions, educational materials, and care plan provided today and DECLINED offer to receive copy of patient instructions, educational materials, and care plan.   No further follow up required: Please contact me as needed  Bevelyn Ngo, BSW, CDP [facility]  [facility], [facility] Social Worker Direct Dial: [phone]  Fax: [phone]

## 2022-12-20 ENCOUNTER — Ambulatory Visit: Payer: Self-pay

## 2022-12-20 NOTE — Patient Outreach (Signed)
Care Coordination   12/20/2022 Name: Austin Mercer. MRN: [id number] DOB: 1950/05/18   Care Coordination Outreach Attempts:  An unsuccessful telephone outreach was attempted for a scheduled appointment today.  Follow Up Plan:  Additional outreach attempts will be made to offer the patient care coordination information and services.   Encounter Outcome:  Patient Request to Call Back   Care Coordination Interventions:  No, not indicated    Delsa Sale RN BSN CCM [place]  [facility], [facility] Nurse Care Coordinator  Direct Dial: [phone] Website: [email or web address]

## 2022-12-29 ENCOUNTER — Telehealth: Payer: Self-pay | Admitting: *Deleted

## 2022-12-29 NOTE — Progress Notes (Signed)
Care Coordination Note  12/29/2022 Name: Austin Mercer. MRN: [id number] DOB: 05-31-1950  Austin Mercer. is a 72 y.o. year old male who is a primary care patient of Storm Frisk, MD and is actively engaged with the care management team. I reached out to [employer]. by phone today to assist with re-scheduling a follow up visit with the RN Case Manager  Follow up plan: Unsuccessful telephone outreach attempt made. A HIPAA compliant phone message was left for the patient providing contact information and requesting a return call.   [facility]  Care Coordination Care Guide  Direct Dial: [phone]

## 2023-01-08 ENCOUNTER — Other Ambulatory Visit: Payer: Self-pay

## 2023-01-08 NOTE — Progress Notes (Signed)
Care Coordination Note  01/08/2023 Name: Denham Wildy. MRN: [id number] DOB: 1951-02-25  Eisen Kailash Glassburn. is a 72 y.o. year old male who is a primary care patient of Storm Frisk, MD and is actively engaged with the care management team. I reached out to [employer]. by phone today to assist with re-scheduling a follow up visit with the RN Case Manager  Follow up plan: Telephone appointment with care management team member scheduled for:10/18  [facility] Coordination Care Guide  Direct Dial: [phone]

## 2023-01-08 NOTE — Progress Notes (Signed)
Care Coordination Note  01/08/2023 Name: Austin Mercer. MRN: [id number] DOB: Oct 07, 1950  [name]. is a 72 y.o. year old male who is a primary care patient of Storm Frisk, MD and is actively engaged with the care management team. I reached out to [employer]. by phone today to assist with re-scheduling a follow up visit with the RN Case Manager  Follow up plan: Unsuccessful telephone outreach attempt made. A HIPAA compliant phone message was left for the patient providing contact information and requesting a return call. We have been unable to make contact with the patient for follow up. The care management team is available to follow up with the patient after provider conversation with the patient regarding recommendation for care management engagement and subsequent re-referral to the care management team.   [facility] Coordination Care Guide  Direct Dial: [phone]

## 2023-01-12 ENCOUNTER — Ambulatory Visit: Payer: Medicare PPO | Admitting: Physician Assistant

## 2023-01-12 ENCOUNTER — Other Ambulatory Visit: Payer: Self-pay

## 2023-01-12 ENCOUNTER — Encounter: Payer: Self-pay | Admitting: Physician Assistant

## 2023-01-12 DIAGNOSIS — M25561 Pain in right knee: Secondary | ICD-10-CM

## 2023-01-12 DIAGNOSIS — G8929 Other chronic pain: Secondary | ICD-10-CM

## 2023-01-12 MED ORDER — TRAMADOL HCL 50 MG PO TABS
50.0000 mg | ORAL_TABLET | Freq: Two times a day (BID) | ORAL | 0 refills | Status: AC | PRN
Start: 2023-01-12 — End: ?
  Filled 2023-01-12: qty 20, 10d supply, fill #0

## 2023-01-12 NOTE — Progress Notes (Signed)
Office Visit Note   Patient: Austin Mercer.           Date of Birth: 19-Jul-1950           MRN: [id number] Visit Date: 01/12/2023              Requested by: Storm Frisk, MD [address] [place],  [place] [address] PCP: Storm Frisk, MD  Chief Complaint  Patient presents with   Right Knee - Pain      HPI: Patient is a 72 year old gentleman with a history of osteoarthritis of his knees.  He was last injected about 2 months ago.  He walks in today asking if he have another cortisone injection into his right knee.  No injury he has fallen a couple times but has not necessarily fallen onto his knees no fever no chills  Assessment & Plan: Visit Diagnoses:  1. Chronic pain of right knee     Plan: He is a month early for an injection he may come back at that time I will give him just a few tramadol to tide him over.  He understands that certainly his weight is a factor in the pain in his knees.  Follow-Up Instructions: No follow-ups on file.   Ortho Exam  Patient is alert, oriented, no adenopathy, well-dressed, normal affect, normal respiratory effort. Examination of his right knee no effusion no erythema compartments are soft and compressible he is neurovascularly intact no evidence of infection  Imaging: No results found. No images are attached to the encounter.  Labs: Lab Results  Component Value Date   HGBA1C 6.2 (H) 05/19/2022   HGBA1C 6.4 (H) 01/04/2022   HGBA1C 7.2 (A) 09/13/2021     Lab Results  Component Value Date   ALBUMIN 4.1 06/22/2022   ALBUMIN 3.9 05/16/2022   ALBUMIN 2.8 (L) 05/11/2022    Lab Results  Component Value Date   MG 1.9 05/16/2022   MG 1.7 12/30/2011   Lab Results  Component Value Date   VD25OH 28.4 (L) 11/23/2017    No results found for: "PREALBUMIN"    Latest Ref Rng & Units 06/22/2022   11:08 AM 05/18/2022    6:26 AM 05/16/2022    3:47 PM  CBC EXTENDED  WBC 3.4 - 10.8 x10E3/uL 7.7  6.0  7.3   RBC 4.14  - 5.80 x10E6/uL 5.05  5.01  5.04   Hemoglobin 13.0 - 17.7 g/dL 95.2  84.1  32.4   HCT 37.5 - 51.0 % 39.2  40.3  41.7   Platelets 150 - 450 x10E3/uL 144  135  141   NEUT# 1.4 - 7.0 x10E3/uL 2.4   3.2   Lymph# 0.7 - 3.1 x10E3/uL 3.7   3.0      There is no height or weight on file to calculate BMI.  Orders:  No orders of the defined types were placed in this encounter.  Meds ordered this encounter  Medications   traMADol (ULTRAM) 50 MG tablet    Sig: Take 1 tablet (50 mg total) by mouth every 12 (twelve) hours as needed for severe pain.    Dispense:  20 tablet    Refill:  0     Procedures: No procedures performed  Clinical Data: No additional findings.  ROS:  All other systems negative, except as noted in the HPI. Review of Systems  Objective: Vital Signs: There were no vitals taken for this visit.  Specialty Comments:  No specialty comments  available.  PMFS History: Patient Active Problem List   Diagnosis Date Noted   History of stroke 05/11/2022   Microalbuminuria due to type 2 diabetes mellitus (HCC) 10/21/2021   Allergic rhinitis 10/18/2021   Hypophosphatemia 09/14/2021   Liver cirrhosis (HCC) 09/12/2021   Moderate episode of recurrent major depressive disorder (HCC) 09/09/2020   Toenail fungus 04/21/2020   Chronic systolic heart failure (HCC) 03/11/2020   First degree AV block 03/04/2020   PVC (premature ventricular contraction) 03/04/2020   Depression 03/04/2020   History of alcohol use disorder 04/26/2018   Erectile dysfunction of organic origin 04/26/2018   Positive for macroalbuminuria 01/27/2018   Hypercalcemia 01/24/2018   Controlled type 2 diabetes mellitus without complication, without long-term current use of insulin (HCC) 11/23/2017   Essential hypertension 11/23/2017   Primary osteoarthritis of right knee 11/23/2017   Class 3 severe obesity due to excess calories with serious comorbidity and body mass index (BMI) of 45.0 to 49.9 in adult (HCC)  11/23/2017   Age-related cataract of right eye 11/23/2017   Hepatitis C virus infection cured after antiviral drug therapy 11/23/2017   Chronic, continuous use of opioids 11/23/2017   Past Medical History:  Diagnosis Date   Diabetes (HCC)    Hypertension    Hypertensive urgency 03/04/2020   Stroke [facility])     Family History  Problem Relation Age of Onset   Diabetes Father     Past Surgical History:  Procedure Laterality Date   ANKLE FRACTURE SURGERY     Social History   Occupational History   Occupation: retired  Tobacco Use   Smoking status: Former    Current packs/day: 0.00    Types: Cigarettes    Quit date: 1973    Years since quitting: 51.8   Smokeless tobacco: Never  Vaping Use   Vaping status: Never Used  Substance and Sexual Activity   Alcohol use: Not Currently    Comment: Beer, Red wine   Drug use: Not Currently   Sexual activity: Not on file

## 2023-01-17 ENCOUNTER — Other Ambulatory Visit (HOSPITAL_BASED_OUTPATIENT_CLINIC_OR_DEPARTMENT_OTHER): Payer: Medicare PPO | Admitting: Pharmacist

## 2023-01-17 ENCOUNTER — Other Ambulatory Visit: Payer: Self-pay

## 2023-01-17 DIAGNOSIS — E782 Mixed hyperlipidemia: Secondary | ICD-10-CM | POA: Diagnosis not present

## 2023-01-17 DIAGNOSIS — E119 Type 2 diabetes mellitus without complications: Secondary | ICD-10-CM

## 2023-01-17 DIAGNOSIS — Z7984 Long term (current) use of oral hypoglycemic drugs: Secondary | ICD-10-CM | POA: Diagnosis not present

## 2023-01-17 DIAGNOSIS — I10 Essential (primary) hypertension: Secondary | ICD-10-CM | POA: Diagnosis not present

## 2023-01-17 NOTE — Progress Notes (Signed)
Pharmacy Quality Measure Review  This patient is appearing on a report for being at risk of failing the adherence measure for cholesterol (statin), diabetes, and hypertension (ACEi/ARB) medications this calendar year.   Medication: metformin Last fill date: 12/11/2022 for 90 day supply  Insurance report was not up to date. No action needed at this time.    Medication: losartan Last fill date: 11/10/2022 for 90 day supply  Insurance report was not up to date. No action needed at this time.    Medication: atorvastatin Last fill date: 10/12/22 for 90 day supply  Contacted pharmacy to facilitate refills.  Butch Penny, PharmD, Patsy [place], CPP Clinical Pharmacist [facility] [phone]

## 2023-01-19 ENCOUNTER — Ambulatory Visit: Payer: Self-pay

## 2023-01-19 NOTE — Patient Outreach (Signed)
Care Coordination   01/19/2023 Name: Austin Mercer. MRN: [id number] DOB: [birth year]   Care Coordination Outreach Attempts:  An unsuccessful telephone outreach was attempted for a scheduled appointment today.  Follow Up Plan:  No further outreach attempts will be made at this time. We have been unable to contact the patient to offer or enroll patient in care coordination services  Encounter Outcome:  No Answer   Care Coordination Interventions:  No, not indicated    Delsa Sale RN BSN CCM [place]  [facility] Nurse Care Coordinator  Direct Dial: [phone] Website: [email or web address]

## 2023-02-05 ENCOUNTER — Other Ambulatory Visit: Payer: Self-pay

## 2023-02-09 ENCOUNTER — Other Ambulatory Visit: Payer: Self-pay

## 2023-02-13 ENCOUNTER — Encounter: Payer: Self-pay | Admitting: Physician Assistant

## 2023-02-13 ENCOUNTER — Ambulatory Visit (INDEPENDENT_AMBULATORY_CARE_PROVIDER_SITE_OTHER): Payer: Medicare PPO | Admitting: Physician Assistant

## 2023-02-13 DIAGNOSIS — M1711 Unilateral primary osteoarthritis, right knee: Secondary | ICD-10-CM | POA: Diagnosis not present

## 2023-02-13 MED ORDER — LIDOCAINE HCL 1 % IJ SOLN
4.0000 mL | INTRAMUSCULAR | Status: AC | PRN
  Administered 2023-02-13: 4 mL

## 2023-02-13 MED ORDER — METHYLPREDNISOLONE ACETATE 40 MG/ML IJ SUSP
40.0000 mg | INTRAMUSCULAR | Status: AC | PRN
  Administered 2023-02-13: 40 mg via INTRA_ARTICULAR

## 2023-02-13 NOTE — Progress Notes (Signed)
Office Visit Note   Patient: Austin Mercer.           Date of Birth: 1951-01-06           MRN: [id number] Visit Date: 02/13/2023              Requested by: Austin Frisk, MD [address] [place],  [place] [address] PCP: Austin Frisk, MD  Chief Complaint  Patient presents with  . Right Knee - Pain    Injection in right knee       HPI: Patient is a pleasant 72 year old gentleman with a history of right knee arthritis comes in today requesting an injection  Assessment & Plan: Visit Diagnoses: Osteoarthritis right knee  Plan: [name] forward with an injection follow-up as needed  Follow-Up Instructions: No follow-ups on file.   Ortho Exam  Patient is alert, oriented, no adenopathy, well-dressed, normal affect, normal respiratory effort. Right knee no swelling no erythema compartments are soft and compressible no evidence of infection  Imaging: No results found. No images are attached to the encounter.  Labs: Lab Results  Component Value Date   HGBA1C 6.2 (H) 05/19/2022   HGBA1C 6.4 (H) 01/04/2022   HGBA1C 7.2 (A) 09/13/2021     Lab Results  Component Value Date   ALBUMIN 4.1 06/22/2022   ALBUMIN 3.9 05/16/2022   ALBUMIN 2.8 (L) 05/11/2022    Lab Results  Component Value Date   MG 1.9 05/16/2022   MG 1.7 12/30/2011   Lab Results  Component Value Date   VD25OH 28.4 (L) 11/23/2017    No results found for: "PREALBUMIN"    Latest Ref Rng & Units 06/22/2022   11:08 AM 05/18/2022    6:26 AM 05/16/2022    3:47 PM  CBC EXTENDED  WBC 3.4 - 10.8 x10E3/uL 7.7  6.0  7.3   RBC 4.14 - 5.80 x10E6/uL 5.05  5.01  5.04   Hemoglobin 13.0 - 17.7 g/dL 33.2  95.1  88.4   HCT 37.5 - 51.0 % 39.2  40.3  41.7   Platelets 150 - 450 x10E3/uL 144  135  141   NEUT# 1.4 - 7.0 x10E3/uL 2.4   3.2   Lymph# 0.7 - 3.1 x10E3/uL 3.7   3.0      There is no height or weight on file to calculate BMI.  Orders:  No orders of the defined types were placed in this  encounter.  No orders of the defined types were placed in this encounter.    Procedures: Large Joint Inj: R knee on 02/13/2023 1:38 PM Indications: pain and diagnostic evaluation Details: 25 G 1.5 in needle, anteromedial approach  Arthrogram: No  Medications: 40 mg methylPREDNISolone acetate 40 MG/ML; 4 mL lidocaine 1 % Outcome: tolerated well, no immediate complications Procedure, treatment alternatives, risks and benefits explained, specific risks discussed. Consent was given by the patient.    Clinical Data: No additional findings.  ROS:  All other systems negative, except as noted in the HPI. Review of Systems  Objective: Vital Signs: There were no vitals taken for this visit.  Specialty Comments:  No specialty comments available.  PMFS History: Patient Active Problem List   Diagnosis Date Noted  . History of stroke 05/11/2022  . Microalbuminuria due to type 2 diabetes mellitus (HCC) 10/21/2021  . Allergic rhinitis 10/18/2021  . Hypophosphatemia 09/14/2021  . Liver cirrhosis (HCC) 09/12/2021  . Moderate episode of recurrent major depressive disorder (HCC) 09/09/2020  . Toenail  fungus 04/21/2020  . Chronic systolic heart failure (HCC) 03/11/2020  . First degree AV block 03/04/2020  . PVC (premature ventricular contraction) 03/04/2020  . Depression 03/04/2020  . History of alcohol use disorder 04/26/2018  . 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 Positive for macroalbuminuria 01/27/2018  . Hypercalcemia 01/24/2018  . Controlled type 2 diabetes mellitus without complication, without long-term current use of insulin (HCC) 11/23/2017  . Essential hypertension 11/23/2017  . Primary osteoarthritis of right knee 11/23/2017  . Class 3 severe obesity due to excess calories with serious comorbidity and body mass index (BMI) of 45.0 to 49.9 in adult (HCC) 11/23/2017  . Age-related cataract of right eye 11/23/2017  . Hepatitis C virus infection cured after  antiviral drug therapy 11/23/2017  . Chronic, continuous use of opioids 11/23/2017   Past Medical History:  Diagnosis Date  . Diabetes (HCC)   . Hypertension   . Hypertensive urgency 03/04/2020  . Stroke [facility])     Family History  Problem Relation Age of Onset  . Diabetes Father     Past Surgical History:  Procedure Laterality Date  . ANKLE FRACTURE SURGERY     Social History   Occupational History  . Occupation: retired  Tobacco Use  . Smoking status: Former    Current packs/day: 0.00    Types: Cigarettes    Quit date: 1973    Years since quitting: 51.8  . Smokeless tobacco: Never  Vaping Use  . Vaping status: Never Used  Substance and Sexual Activity  . Alcohol use: Not Currently    Comment: Beer, Red wine  . Drug use: Not Currently  . Sexual activity: Not on file

## 2023-02-14 ENCOUNTER — Other Ambulatory Visit: Payer: Self-pay

## 2023-03-05 ENCOUNTER — Other Ambulatory Visit: Payer: Self-pay

## 2023-03-05 ENCOUNTER — Other Ambulatory Visit: Payer: Self-pay | Admitting: Pharmacist

## 2023-03-05 ENCOUNTER — Other Ambulatory Visit: Payer: Self-pay | Admitting: Critical Care Medicine

## 2023-03-05 DIAGNOSIS — I5022 Chronic systolic (congestive) heart failure: Secondary | ICD-10-CM

## 2023-03-05 DIAGNOSIS — Z87898 Personal history of other specified conditions: Secondary | ICD-10-CM

## 2023-03-05 MED ORDER — FAMOTIDINE 20 MG PO TABS
20.0000 mg | ORAL_TABLET | Freq: Every day | ORAL | 0 refills | Status: DC
  Filled 2023-03-05 – 2023-04-02 (×2): qty 30, 30d supply, fill #0

## 2023-03-05 MED ORDER — FUROSEMIDE 40 MG PO TABS
40.0000 mg | ORAL_TABLET | Freq: Every day | ORAL | 0 refills | Status: DC
  Filled 2023-03-05 – 2023-04-02 (×2): qty 30, 30d supply, fill #0

## 2023-03-14 ENCOUNTER — Other Ambulatory Visit: Payer: Self-pay

## 2023-03-14 ENCOUNTER — Ambulatory Visit: Payer: Medicare PPO | Admitting: Physician Assistant

## 2023-03-19 ENCOUNTER — Other Ambulatory Visit: Payer: Self-pay

## 2023-03-20 ENCOUNTER — Other Ambulatory Visit: Payer: Self-pay

## 2023-04-02 ENCOUNTER — Other Ambulatory Visit: Payer: Self-pay | Admitting: Pharmacist

## 2023-04-02 ENCOUNTER — Other Ambulatory Visit: Payer: Self-pay

## 2023-04-02 DIAGNOSIS — I5022 Chronic systolic (congestive) heart failure: Secondary | ICD-10-CM

## 2023-04-02 DIAGNOSIS — R809 Proteinuria, unspecified: Secondary | ICD-10-CM

## 2023-04-02 MED ORDER — DAPAGLIFLOZIN PROPANEDIOL 10 MG PO TABS
10.0000 mg | ORAL_TABLET | Freq: Every day | ORAL | 0 refills | Status: DC
  Filled 2023-04-02: qty 30, 30d supply, fill #0

## 2023-04-17 ENCOUNTER — Other Ambulatory Visit: Payer: Self-pay

## 2023-04-26 ENCOUNTER — Ambulatory Visit: Payer: Medicare PPO | Admitting: Internal Medicine

## 2023-04-30 ENCOUNTER — Other Ambulatory Visit: Payer: Self-pay | Admitting: Family Medicine

## 2023-04-30 ENCOUNTER — Other Ambulatory Visit: Payer: Self-pay

## 2023-04-30 DIAGNOSIS — I5022 Chronic systolic (congestive) heart failure: Secondary | ICD-10-CM

## 2023-04-30 DIAGNOSIS — Z87898 Personal history of other specified conditions: Secondary | ICD-10-CM

## 2023-04-30 MED ORDER — FUROSEMIDE 40 MG PO TABS
40.0000 mg | ORAL_TABLET | Freq: Every day | ORAL | 0 refills | Status: DC
  Filled 2023-04-30 – 2023-05-15 (×2): qty 30, 30d supply, fill #0

## 2023-04-30 MED ORDER — FAMOTIDINE 20 MG PO TABS
20.0000 mg | ORAL_TABLET | Freq: Every day | ORAL | 0 refills | Status: DC
  Filled 2023-04-30 – 2023-05-15 (×2): qty 30, 30d supply, fill #0

## 2023-05-01 ENCOUNTER — Other Ambulatory Visit: Payer: Self-pay

## 2023-05-09 ENCOUNTER — Other Ambulatory Visit: Payer: Self-pay

## 2023-05-15 ENCOUNTER — Other Ambulatory Visit: Payer: Self-pay

## 2023-05-28 ENCOUNTER — Other Ambulatory Visit: Payer: Self-pay | Admitting: Pharmacist

## 2023-05-28 ENCOUNTER — Other Ambulatory Visit: Payer: Self-pay | Admitting: Family Medicine

## 2023-05-28 ENCOUNTER — Other Ambulatory Visit: Payer: Self-pay | Admitting: Critical Care Medicine

## 2023-05-28 ENCOUNTER — Other Ambulatory Visit: Payer: Self-pay

## 2023-05-28 DIAGNOSIS — E1129 Type 2 diabetes mellitus with other diabetic kidney complication: Secondary | ICD-10-CM

## 2023-05-28 DIAGNOSIS — I5022 Chronic systolic (congestive) heart failure: Secondary | ICD-10-CM

## 2023-05-28 MED ORDER — AMLODIPINE BESYLATE 10 MG PO TABS
10.0000 mg | ORAL_TABLET | Freq: Every day | ORAL | 0 refills | Status: DC
  Filled 2023-05-28: qty 60, 60d supply, fill #0

## 2023-05-28 MED ORDER — DAPAGLIFLOZIN PROPANEDIOL 10 MG PO TABS
10.0000 mg | ORAL_TABLET | Freq: Every day | ORAL | 1 refills | Status: DC
  Filled 2023-05-28: qty 30, 30d supply, fill #0

## 2023-06-25 ENCOUNTER — Other Ambulatory Visit: Payer: Self-pay | Admitting: Physician Assistant

## 2023-06-25 ENCOUNTER — Other Ambulatory Visit: Payer: Self-pay | Admitting: Critical Care Medicine

## 2023-06-25 ENCOUNTER — Other Ambulatory Visit: Payer: Self-pay

## 2023-06-25 ENCOUNTER — Other Ambulatory Visit: Payer: Self-pay | Admitting: Family Medicine

## 2023-06-25 DIAGNOSIS — Z87898 Personal history of other specified conditions: Secondary | ICD-10-CM

## 2023-06-25 DIAGNOSIS — E782 Mixed hyperlipidemia: Secondary | ICD-10-CM

## 2023-06-25 DIAGNOSIS — I5022 Chronic systolic (congestive) heart failure: Secondary | ICD-10-CM

## 2023-06-26 ENCOUNTER — Other Ambulatory Visit: Payer: Self-pay | Admitting: Nurse Practitioner

## 2023-06-26 ENCOUNTER — Other Ambulatory Visit (HOSPITAL_BASED_OUTPATIENT_CLINIC_OR_DEPARTMENT_OTHER): Payer: Self-pay

## 2023-06-26 ENCOUNTER — Other Ambulatory Visit: Payer: Self-pay

## 2023-06-26 ENCOUNTER — Ambulatory Visit: Admitting: Nurse Practitioner

## 2023-06-26 DIAGNOSIS — I10 Essential (primary) hypertension: Secondary | ICD-10-CM

## 2023-06-26 DIAGNOSIS — E1129 Type 2 diabetes mellitus with other diabetic kidney complication: Secondary | ICD-10-CM

## 2023-06-26 DIAGNOSIS — R7989 Other specified abnormal findings of blood chemistry: Secondary | ICD-10-CM

## 2023-06-26 MED ORDER — FAMOTIDINE 20 MG PO TABS
20.0000 mg | ORAL_TABLET | Freq: Every day | ORAL | 0 refills | Status: DC
  Filled 2023-06-26: qty 30, 30d supply, fill #0

## 2023-06-26 NOTE — Telephone Encounter (Signed)
 Requested medications are due for refill today.  yes  Requested medications are on the active medications list.  yes  Last refill. 09/20/2022 #90 1 rf  Future visit scheduled.   yes  Notes to clinic.  Labs are expired.    Requested Prescriptions  Pending Prescriptions Disp Refills   atorvastatin (LIPITOR) 20 MG tablet 90 tablet 1    Sig: Take 1 tablet (20 mg total) by mouth daily.     Cardiovascular:  Antilipid - Statins Failed - 06/26/2023  4:39 PM      Failed - Lipid Panel in normal range within the last 12 months    Cholesterol, Total  Date Value Ref Range Status  02/23/2021 108 100 - 199 mg/dL Final   Cholesterol  Date Value Ref Range Status  05/18/2022 109 0 - 200 mg/dL Final   LDL Chol Calc (NIH)  Date Value Ref Range Status  02/23/2021 52 0 - 99 mg/dL Final   LDL Cholesterol  Date Value Ref Range Status  05/18/2022 59 0 - 99 mg/dL Final    Comment:           Total Cholesterol/HDL:CHD Risk Coronary Heart Disease Risk Table                     Men   Women  1/2 Average Risk   3.4   3.3  Average Risk       5.0   4.4  2 X Average Risk   9.6   7.1  3 X Average Risk  23.4   11.0        Use the calculated Patient Ratio above and the CHD Risk Table to determine the patient's CHD Risk.        ATP III CLASSIFICATION (LDL):  <100     mg/dL   Optimal  010-272  mg/dL   Near or Above                    Optimal  130-159  mg/dL   Borderline  536-644  mg/dL   High  >034     mg/dL   Very High Performed at [facility], [address] [place], [place] [address]    HDL  Date Value Ref Range Status  05/18/2022 29 (L) >40 mg/dL Final  56/38/7564 36 (L) >39 mg/dL Final   Triglycerides  Date Value Ref Range Status  05/18/2022 106 <150 mg/dL Final         Passed - Patient is not pregnant      Passed - Valid encounter within last 12 months    Recent Outpatient Visits           9 months ago Controlled type 2 diabetes mellitus without complication, without  long-term current use of insulin (HCC)   [facility] [facility] [place]. [facility] Storm Frisk, MD   11 months ago Controlled type 2 diabetes mellitus with microalbuminuria, without long-term current use of insulin (HCC)   [facility] [facility] - [facility] Of [place]. [facility] [place], [place], [place]   1 year ago Controlled type 2 diabetes mellitus with microalbuminuria, without long-term current use of insulin [facility])   [place] [facility] - [facility] Dept Of [place]. [facility] [place], [place]zella [place]lein, [place]   1 year ago Essential hypertension   [place] [facility] [place] [facility] Of [place]  [facility], [place], [place]   1 year ago Primary osteoarthritis of right knee   [place] [facility] [place] - [facility] Dept Of [place]. [facility] Claiborne Rigg, NP              Signed Prescriptions Disp Refills   famotidine (PEPCID) 20 MG tablet 30 tablet 0    Sig: Take 1 tablet (20 mg total) by mouth daily.     Gastroenterology:  H2 Antagonists Passed - 06/26/2023  4:39 PM      Passed - Valid encounter within last 12 months    Recent Outpatient Visits           9 months ago Controlled type 2 diabetes mellitus without complication, without long-term current use of insulin (HCC)   [place] [facility] [place] [facility] [place]. [facility] Storm Frisk, MD   11 months ago Controlled type 2 diabetes mellitus with microalbuminuria, without long-term current use of insulin (HCC)   [place] [facility] - [facility] Dept Of [place]. [facility] [place], [place], [place]   1 year ago Controlled type 2 diabetes mellitus with microalbuminuria, without long-term current use of insulin [facility])   [place] [facility] - [facility] Dept Of [place]. [facility] [place], [place]zella [place], [place]   1 year ago Essential hypertension   [place] [facility] [place] - [facility] Dept  Of [place]. [facility] [place], [place]zella [place], [place]   1 year ago Primary osteoarthritis of right knee   [place] [facility] [place] [facility] Dept Of [place]. [facility] Claiborne Rigg, [place]

## 2023-06-26 NOTE — Telephone Encounter (Signed)
 Requested medication (s) are due for refill today: yes  Requested medication (s) are on the active medication list: yes  Last refill:  05/28/23  Future visit scheduled: no  Notes to clinic:  Unable to refill per protocol, courtesy refill already given, routing for provider approval.      Requested Prescriptions  Pending Prescriptions Disp Refills   amLODipine (NORVASC) 10 MG tablet 60 tablet 0    Sig: Take 1 tablet (10 mg total) by mouth daily.     Cardiovascular: Calcium Channel Blockers 2 Failed - 06/26/2023  4:16 PM      Failed - Valid encounter within last 6 months    Recent Outpatient Visits           9 months ago Controlled type 2 diabetes mellitus without complication, without long-term current use of insulin (HCC)   [place] [facility] [place] [facility] [place]. [facility] Storm Frisk, MD   11 months ago Controlled type 2 diabetes mellitus with microalbuminuria, without long-term current use of insulin (HCC)   [place] [facility] - [facility] Dept Of [place]. [facility] [place], [place], [place]   1 year ago Controlled type 2 diabetes mellitus with microalbuminuria, without long-term current use of insulin [facility])   [place] [facility] - [facility] Dept Of [place]. [facility] [place], [place]zella [place], [place]   1 year ago Essential hypertension   [place] [facility] [place] - [facility] Dept Of [place]. [facility] [place], [place]zella [place], [place]   1 year ago Primary osteoarthritis of right knee   [place] [facility] [place] - [facility] Dept Of [place]. [facility] [place], [place] W, NP              Passed - Last BP in normal range    BP Readings from Last 1 Encounters:  09/20/22 138/86         Passed - Last Heart Rate in normal range    Pulse Readings from Last 1 Encounters:  09/20/22 62

## 2023-06-26 NOTE — Telephone Encounter (Signed)
 Requested Prescriptions  Pending Prescriptions Disp Refills   famotidine (PEPCID) 20 MG tablet 30 tablet 0    Sig: Take 1 tablet (20 mg total) by mouth daily.     Gastroenterology:  H2 Antagonists Passed - 06/26/2023  4:38 PM      Passed - Valid encounter within last 12 months    Recent Outpatient Visits           9 months ago Controlled type 2 diabetes mellitus without complication, without long-term current use of insulin (HCC)   [facility] Of [place]. [facility] Storm Frisk, MD   11 months ago Controlled type 2 diabetes mellitus with microalbuminuria, without long-term current use of insulin (HCC)   [facility] [place]. [facility] [place], [place], [place]   1 year ago Controlled type 2 diabetes mellitus with microalbuminuria, without long-term current use of insulin [facility])   [facility] Of [place]. [facility] [place], [place]zella Schlein, [place]   1 year ago Essential hypertension   [facility] Of [place]. [facility] [place], [place]zella Schlein, [place]   1 year ago Primary osteoarthritis of right knee   [facility] Of [place]. [facility] [place], [place] W, NP               atorvastatin (LIPITOR) 20 MG tablet 90 tablet 1    Sig: Take 1 tablet (20 mg total) by mouth daily.     Cardiovascular:  Antilipid - Statins Failed - 06/26/2023  4:38 PM      Failed - Lipid Panel in normal range within the last 12 months    Cholesterol, Total  Date Value Ref Range Status  02/23/2021 108 100 - 199 mg/dL Final   Cholesterol  Date Value Ref Range Status  05/18/2022 109 0 - 200 mg/dL Final   LDL Chol Calc (NIH)  Date Value Ref Range Status  02/23/2021 52 0 - 99 mg/dL Final   LDL Cholesterol  Date Value Ref Range Status  05/18/2022 59 0 - 99 mg/dL Final    Comment:           Total Cholesterol/HDL:CHD  Risk Coronary Heart Disease Risk Table                     Men   Women  1/2 Average Risk   3.4   3.3  Average Risk       5.0   4.4  2 X Average Risk   9.6   7.1  3 X Average Risk  23.4   11.0        Use the calculated Patient Ratio above and the CHD Risk Table to determine the patient's CHD Risk.        ATP III CLASSIFICATION (LDL):  <100     mg/dL   Optimal  147-829  mg/dL   Near or Above                    Optimal  130-159  mg/dL   Borderline  562-130  mg/dL   High  >865     mg/dL   Very High Performed at [facility], [address], [place], [place] [address]    HDL  Date Value Ref Range Status  05/18/2022 29 (L) >40 mg/dL Final  11/91/4782 36 (L) >39 mg/dL Final   Triglycerides  Date Value Ref Range Status  05/18/2022 106 <150 mg/dL Final         Passed - Patient is not pregnant      Passed - Valid encounter within last 12 months    Recent Outpatient Visits           9 months ago Controlled type 2 diabetes mellitus without complication, without long-term current use of insulin (HCC)   [facility] [place]. [facility] [place] Frisk, MD   11 months ago Controlled type 2 diabetes mellitus with microalbuminuria, without long-term current use of insulin (HCC)   [facility] Of [place]. [facility] [place], [place], [place]   1 year ago Controlled type 2 diabetes mellitus with microalbuminuria, without long-term current use of insulin [facility])   [facility] - [facility] Dept Of [place]. [facility] [place], [place]zella [place], [place]   1 year ago Essential hypertension   [facility] - [facility] Dept Of [place]. [facility] [place], [place]ella [place]lein, [place]   1 year ago Primary osteoarthritis of right knee   [facility] - [facility] Dept Of [place]. [facility] Claiborne Rigg, [place]

## 2023-06-29 ENCOUNTER — Other Ambulatory Visit: Payer: Self-pay

## 2023-07-02 ENCOUNTER — Encounter: Payer: Self-pay | Admitting: Nurse Practitioner

## 2023-07-02 ENCOUNTER — Ambulatory Visit: Attending: Nurse Practitioner | Admitting: Nurse Practitioner

## 2023-07-02 ENCOUNTER — Other Ambulatory Visit: Payer: Self-pay

## 2023-07-02 VITALS — BP 155/77 | HR 68 | Ht 74.0 in | Wt 354.8 lb

## 2023-07-02 DIAGNOSIS — R7989 Other specified abnormal findings of blood chemistry: Secondary | ICD-10-CM | POA: Diagnosis not present

## 2023-07-02 DIAGNOSIS — Z7984 Long term (current) use of oral hypoglycemic drugs: Secondary | ICD-10-CM

## 2023-07-02 DIAGNOSIS — I639 Cerebral infarction, unspecified: Secondary | ICD-10-CM

## 2023-07-02 DIAGNOSIS — E119 Type 2 diabetes mellitus without complications: Secondary | ICD-10-CM | POA: Diagnosis not present

## 2023-07-02 DIAGNOSIS — I10 Essential (primary) hypertension: Secondary | ICD-10-CM

## 2023-07-02 DIAGNOSIS — G4733 Obstructive sleep apnea (adult) (pediatric): Secondary | ICD-10-CM

## 2023-07-02 DIAGNOSIS — E782 Mixed hyperlipidemia: Secondary | ICD-10-CM

## 2023-07-02 DIAGNOSIS — Z87898 Personal history of other specified conditions: Secondary | ICD-10-CM

## 2023-07-02 DIAGNOSIS — I5022 Chronic systolic (congestive) heart failure: Secondary | ICD-10-CM

## 2023-07-02 MED ORDER — ATORVASTATIN CALCIUM 20 MG PO TABS
20.0000 mg | ORAL_TABLET | Freq: Every day | ORAL | 1 refills | Status: DC
  Filled 2023-07-02: qty 90, 90d supply, fill #0
  Filled 2023-10-16: qty 90, 90d supply, fill #1
  Filled 2023-10-29: qty 12, 12d supply, fill #1

## 2023-07-02 MED ORDER — LOSARTAN POTASSIUM 100 MG PO TABS
100.0000 mg | ORAL_TABLET | Freq: Every day | ORAL | 1 refills | Status: DC
Start: 2023-07-02 — End: 2023-11-07
  Filled 2023-07-02: qty 90, 90d supply, fill #0
  Filled 2023-10-09: qty 90, 90d supply, fill #1

## 2023-07-02 MED ORDER — ASPIRIN 81 MG PO TBEC
81.0000 mg | DELAYED_RELEASE_TABLET | Freq: Every day | ORAL | 3 refills | Status: DC
Start: 2023-07-02 — End: 2023-11-07
  Filled 2023-07-02: qty 90, 90d supply, fill #0
  Filled 2023-10-16: qty 90, 90d supply, fill #1

## 2023-07-02 MED ORDER — FUROSEMIDE 40 MG PO TABS
40.0000 mg | ORAL_TABLET | Freq: Every day | ORAL | 0 refills | Status: DC
  Filled 2023-07-02: qty 90, 90d supply, fill #0

## 2023-07-02 MED ORDER — B COMPLEX VITAMINS PO CAPS
1.0000 | ORAL_CAPSULE | Freq: Every day | ORAL | 3 refills | Status: AC
  Filled 2023-07-02: qty 90, 90d supply, fill #0

## 2023-07-02 MED ORDER — CARVEDILOL 25 MG PO TABS
25.0000 mg | ORAL_TABLET | Freq: Two times a day (BID) | ORAL | 1 refills | Status: DC
  Filled 2023-07-02 – 2023-08-28 (×2): qty 180, 90d supply, fill #0
  Filled 2023-10-16: qty 180, 90d supply, fill #1
  Filled 2023-11-27: qty 60, 30d supply, fill #1
  Filled 2024-01-18: qty 120, 60d supply, fill #2

## 2023-07-02 MED ORDER — VITAMIN B-1 100 MG PO TABS
100.0000 mg | ORAL_TABLET | Freq: Every day | ORAL | 1 refills | Status: AC
  Filled 2023-07-02: qty 100, 100d supply, fill #0
  Filled 2023-10-09: qty 80, 80d supply, fill #1

## 2023-07-02 MED ORDER — DAPAGLIFLOZIN PROPANEDIOL 10 MG PO TABS
10.0000 mg | ORAL_TABLET | Freq: Every day | ORAL | 1 refills | Status: DC
  Filled 2023-07-02: qty 90, 90d supply, fill #0
  Filled 2023-11-27: qty 90, 90d supply, fill #1

## 2023-07-02 MED ORDER — FAMOTIDINE 20 MG PO TABS
20.0000 mg | ORAL_TABLET | Freq: Every day | ORAL | 1 refills | Status: DC
  Filled 2023-07-02 – 2023-07-31 (×2): qty 90, 90d supply, fill #0
  Filled 2023-10-16: qty 90, 90d supply, fill #1
  Filled 2023-10-29: qty 16, 16d supply, fill #1

## 2023-07-02 MED ORDER — AMLODIPINE BESYLATE 10 MG PO TABS
10.0000 mg | ORAL_TABLET | Freq: Every day | ORAL | 1 refills | Status: DC
  Filled 2023-07-02 – 2023-07-31 (×2): qty 90, 90d supply, fill #0
  Filled 2023-10-16: qty 90, 90d supply, fill #1
  Filled 2023-10-29: qty 9, 9d supply, fill #1

## 2023-07-02 MED ORDER — SPIRONOLACTONE 25 MG PO TABS
25.0000 mg | ORAL_TABLET | Freq: Every day | ORAL | 1 refills | Status: DC
  Filled 2023-07-02 – 2023-09-10 (×2): qty 90, 90d supply, fill #0
  Filled 2023-10-16 – 2023-12-24 (×2): qty 90, 90d supply, fill #1

## 2023-07-02 MED ORDER — METFORMIN HCL 500 MG PO TABS
500.0000 mg | ORAL_TABLET | Freq: Two times a day (BID) | ORAL | 1 refills | Status: AC
  Filled 2023-07-02 – 2023-10-09 (×2): qty 180, 90d supply, fill #0
  Filled 2023-10-16 – 2023-12-24 (×2): qty 180, 90d supply, fill #1

## 2023-07-02 MED ORDER — POTASSIUM CHLORIDE ER 8 MEQ PO TBCR
16.0000 meq | EXTENDED_RELEASE_TABLET | Freq: Two times a day (BID) | ORAL | 1 refills | Status: DC
  Filled 2023-07-02: qty 120, 30d supply, fill #0
  Filled 2023-08-28 – 2023-09-10 (×3): qty 120, 30d supply, fill #1
  Filled 2023-10-09: qty 120, 30d supply, fill #2

## 2023-07-02 NOTE — Progress Notes (Signed)
 Assessment & Plan:  Austin Mercer was seen today for medication refill and medical management of chronic issues.  Diagnoses and all orders for this visit:  Primary hypertension -     CMP14+EGFR -     amLODipine (NORVASC) 10 MG tablet; Take 1 tablet (10 mg total) by mouth daily. -     carvedilol (COREG) 25 MG tablet; Take 1 tablet (25 mg total) by mouth 2 (two) times daily with a meal. -     losartan (COZAAR) 100 MG tablet; Take 1 tablet (100 mg total) by mouth daily. -     potassium chloride (KLOR-CON) 8 MEQ tablet; Take 2 tablets (16 mEq total) by mouth 2 (two) times daily. Continue all antihypertensives as prescribed.  Reminded to bring in blood pressure log for follow  up appointment.  RECOMMENDATIONS: DASH/Mediterranean Diets are healthier choices for HTN.    Diabetes mellitus treated with oral medication (HCC) -     Hemoglobin A1c -     CMP14+EGFR -     metFORMIN (GLUCOPHAGE) 500 MG tablet; Take 1 tablet (500 mg total) by mouth 2 (two) times daily with a meal. -     Microalbumin / creatinine urine ratio -     dapagliflozin propanediol (FARXIGA) 10 MG TABS tablet; Take 1 tablet (10 mg total) by mouth daily before breakfast. Continue blood sugar control as discussed in office today, low carbohydrate diet, and regular physical exercise as tolerated, 150 minutes per week (30 min each day, 5 days per week, or 50 min 3 days per week). Keep blood sugar logs with fasting goal of 90-130 mg/dl, post prandial (after you eat) less than 180.  For Hypoglycemia: BS <60 and Hyperglycemia BS >400; contact the clinic ASAP. Annual eye exams and foot exams are recommended.   Serum calcium elevated -     PTH, Intact and Calcium -     CMP14+EGFR  Abnormal CBC -     CBC with Differential  Mixed hyperlipidemia -     atorvastatin (LIPITOR) 20 MG tablet; Take 1 tablet (20 mg total) by mouth daily.  Cerebrovascular accident (CVA), unspecified mechanism (HCC) -     aspirin EC 81 MG tablet; Take 1 tablet (81  mg total) by mouth daily. Swallow whole.  History of alcohol use disorder -     famotidine (PEPCID) 20 MG tablet; Take 1 tablet (20 mg total) by mouth daily. -     thiamine (VITAMIN B-1) 100 MG tablet; Take 1 tablet (100 mg total) by mouth daily. -     b complex vitamins capsule; Take 1 capsule by mouth daily.  Chronic systolic heart failure (HCC) -     furosemide (LASIX) 40 MG tablet; Take 1 tablet (40 mg total) by mouth daily. -     spironolactone (ALDACTONE) 25 MG tablet; Take 1 tablet (25 mg total) by mouth daily. -     dapagliflozin propanediol (FARXIGA) 10 MG TABS tablet; Take 1 tablet (10 mg total) by mouth daily before breakfast.  OSA (obstructive sleep apnea) -     Nocturnal polysomnography; Future    Patient has been counseled on age-appropriate routine health concerns for screening and prevention. These are reviewed and up-to-date. Referrals have been placed accordingly. Immunizations are up-to-date or declined.    Subjective:   Chief Complaint  Patient presents with   Medication Refill   Medical Management of Chronic Issues    Austin Mercer. 73 y.o. male presents to office today for follow-up to hypertension. He is  a previous patient of Dr. Delford Field  He has a past medical history of Diabetes, Hypertension, Stroke, CHF, ETOH use disorder, primary OA of right knee, liver cirrhosis   HTN Blood pressure is elevated today. He is out of several medications. Continues to drink alcohol "here and there". No longer sees Cardiology as he declined the loop recorder. States he does not see a reason to go back BP Readings from Last 3 Encounters:  07/02/23 (!) 155/77  09/20/22 138/86  07/20/22 (!) 148/75    Stroke Neurology instructed him to stop plavix last year. However he is currently still taking. I have instructed him to stop plavix at this time and continue aspirin only.   He is requesting tramadol however I have instructed him that this medication was not prescribed by  any providers in this office and he will need to follow up with ortho for refills.     DM 2 A1c at goal with metformin 500 mg BID.  Lab Results  Component Value Date   HGBA1C 6.2 (H) 05/19/2022    Sleep Apnea: Patient presents with possible obstructive sleep apnea. Patent has a a few years history of symptoms of daytime fatigue, morning fatigue, and hypertension. Patient generally gets several hours of sleep per night, and states they generally have nightime awakenings and difficulty falling back asleep if awakened. Snoring of moderate severity is present. Apneic episodes are present. Nasal obstruction is not present.  Patient did not have had tonsillectomy.   Review of Systems  Constitutional:  Negative for fever, malaise/fatigue and weight loss.  HENT: Negative.  Negative for nosebleeds.   Eyes: Negative.  Negative for blurred vision, double vision and photophobia.  Respiratory: Negative.  Negative for cough and shortness of breath.   Cardiovascular: Negative.  Negative for chest pain, palpitations and leg swelling.  Gastrointestinal: Negative.  Negative for heartburn, nausea and vomiting.  Musculoskeletal: Negative.  Negative for myalgias.  Neurological: Negative.  Negative for dizziness, focal weakness, seizures and headaches.  Psychiatric/Behavioral:  Positive for substance abuse. Negative for suicidal ideas.     Past Medical History:  Diagnosis Date   Diabetes (HCC)    Hypertension    Hypertensive urgency 03/04/2020   Stroke [facility])     Past Surgical History:  Procedure Laterality Date   ANKLE FRACTURE SURGERY      Family History  Problem Relation Age of Onset   Diabetes Father     Social History Reviewed with no changes to be made today.   Outpatient Medications Prior to Visit  Medication Sig Dispense Refill   amLODipine (NORVASC) 10 MG tablet Take 1 tablet (10 mg total) by mouth daily. 60 tablet 0   aspirin EC 81 MG tablet Take 1 tablet (81 mg total) by mouth  daily. Swallow whole. (Patient taking differently: Take 81 mg by mouth daily.) 30 tablet 12   atorvastatin (LIPITOR) 20 MG tablet Take 1 tablet (20 mg total) by mouth daily. 90 tablet 1   carvedilol (COREG) 25 MG tablet Take 1 tablet (25 mg total) by mouth 2 (two) times daily with a meal. 120 tablet 3   clopidogrel (PLAVIX) 75 MG tablet Take 1 tablet (75 mg total) by mouth daily. 90 tablet 2   dapagliflozin propanediol (FARXIGA) 10 MG TABS tablet Take 1 tablet (10 mg total) by mouth once daily before breakfast. 30 tablet 1   famotidine (PEPCID) 20 MG tablet Take 1 tablet (20 mg total) by mouth daily. 30 tablet 0   furosemide (LASIX) 40  MG tablet Take 1 tablet (40 mg total) by mouth daily. 30 tablet 0   losartan (COZAAR) 100 MG tablet Take 1 tablet (100 mg total) by mouth once daily. 90 tablet 2   metFORMIN (GLUCOPHAGE) 500 MG tablet Take 1 tablet (500 mg total) by mouth 2 (two) times daily with a meal. 180 tablet 2   potassium chloride (KLOR-CON) 8 MEQ tablet Take 2 tablets (16 mEq total) by mouth 2 (two) times daily. 120 tablet 6   spironolactone (ALDACTONE) 25 MG tablet Take 1 tablet (25 mg total) by mouth once daily. 90 tablet 2   thiamine (VITAMIN B-1) 100 MG tablet Take 1 tablet (100 mg total) by mouth daily. 30 tablet 0   Accu-Chek Softclix Lancets lancets Use to check blood sugar twice daily (Patient not taking: Reported on 07/02/2023) 100 each 6   Blood Glucose Monitoring Suppl (ACCU-CHEK GUIDE) w/Device KIT Use to check blood sugar twice daily (Patient not taking: Reported on 07/02/2023) 1 kit 0   Blood Pressure Monitoring (BLOOD PRESSURE KIT) DEVI Use to measure blood pressure (Patient not taking: Reported on 07/02/2023) 1 each 0   glucose blood (ACCU-CHEK GUIDE) test strip Use to check blood sugar twice daily 100 each 6   traMADol (ULTRAM) 50 MG tablet Take 1 tablet (50 mg total) by mouth every 12 (twelve) hours as needed for severe pain. (Patient not taking: Reported on 07/02/2023) 20 tablet  0   polyethylene glycol (MIRALAX / GLYCOLAX) 17 g packet Mix 17 g in 4-8 ounces of juice/water and take by mouth daily. (Patient not taking: Reported on 07/02/2023) 14 each 0   senna-docusate (SENOKOT-S) 8.6-50 MG tablet Take 2 tablets by mouth 2 (two) times daily. (Patient not taking: Reported on 07/02/2023) 60 tablet 0   No facility-administered medications prior to visit.    Allergies  Allergen Reactions   Codeine Itching   Latex Itching       Objective:    BP (!) 155/77 (BP Location: Left Arm, Patient Position: Sitting, Cuff Size: Normal)   Pulse 68   Ht 6\' 2"  (1.88 m)   Wt (!) 354 lb 12.8 oz (160.9 kg)   BMI 45.55 kg/m  Wt Readings from Last 3 Encounters:  07/02/23 (!) 354 lb 12.8 oz (160.9 kg)  09/20/22 (!) 342 lb 9.6 oz (155.4 kg)  07/20/22 (!) 337 lb (152.9 kg)    Physical Exam Vitals and nursing note reviewed.  Constitutional:      Appearance: He is well-developed.  HENT:     Head: Normocephalic and atraumatic.  Cardiovascular:     Rate and Rhythm: Normal rate and regular rhythm.     Heart sounds: Normal heart sounds. No murmur heard.    No friction rub. No gallop.  Pulmonary:     Effort: Pulmonary effort is normal. No tachypnea or respiratory distress.     Breath sounds: Normal breath sounds. No decreased breath sounds, wheezing, rhonchi or rales.  Chest:     Chest wall: No tenderness.  Abdominal:     General: Bowel sounds are normal.     Palpations: Abdomen is soft.  Musculoskeletal:        General: Normal range of motion.     Cervical back: Normal range of motion.  Skin:    General: Skin is warm and dry.  Neurological:     Mental Status: He is alert and oriented to person, place, and time.     Coordination: Coordination normal.  Psychiatric:  Behavior: Behavior normal. Behavior is cooperative.        Thought Content: Thought content normal.        Judgment: Judgment normal.          Patient has been counseled extensively about nutrition  and exercise as well as the importance of adherence with medications and regular follow-up. The patient was given clear instructions to go to ER or return to medical center if symptoms don't improve, worsen or new problems develop. The patient verbalized understanding.   Follow-up: Return in about 3 months (around 10/01/2023).   Claiborne Rigg, FNP-BC [facility] and [facility] [place], [place] [phone]   07/02/2023, 6:06 PM

## 2023-07-02 NOTE — Patient Instructions (Addendum)
 B COMPLEX FOR VITAMINS

## 2023-07-04 ENCOUNTER — Other Ambulatory Visit: Payer: Self-pay | Admitting: Nurse Practitioner

## 2023-07-04 LAB — CBC WITH DIFFERENTIAL/PLATELET
Basophils Absolute: 0.1 10*3/uL (ref 0.0–0.2)
Basos: 1 %
EOS (ABSOLUTE): 0.3 10*3/uL (ref 0.0–0.4)
Eos: 4 %
Hematocrit: 42.1 % (ref 37.5–51.0)
Hemoglobin: 13.9 g/dL (ref 13.0–17.7)
Immature Grans (Abs): 0 10*3/uL (ref 0.0–0.1)
Immature Granulocytes: 0 %
Lymphocytes Absolute: 3 10*3/uL (ref 0.7–3.1)
Lymphs: 43 %
MCH: 27.5 pg (ref 26.6–33.0)
MCHC: 33 g/dL (ref 31.5–35.7)
MCV: 83 fL (ref 79–97)
Monocytes Absolute: 1 10*3/uL — ABNORMAL HIGH (ref 0.1–0.9)
Monocytes: 15 %
Neutrophils Absolute: 2.7 10*3/uL (ref 1.4–7.0)
Neutrophils: 37 %
Platelets: 175 10*3/uL (ref 150–450)
RBC: 5.06 x10E6/uL (ref 4.14–5.80)
RDW: 14.9 % (ref 11.6–15.4)
WBC: 7.2 10*3/uL (ref 3.4–10.8)

## 2023-07-04 LAB — CMP14+EGFR
ALT: 30 IU/L (ref 0–44)
AST: 25 IU/L (ref 0–40)
Albumin: 4.4 g/dL (ref 3.9–4.9)
Alkaline Phosphatase: 56 IU/L (ref 44–121)
BUN/Creatinine Ratio: 9 — ABNORMAL LOW (ref 10–24)
BUN: 13 mg/dL (ref 8–27)
Bilirubin Total: 0.4 mg/dL (ref 0.0–1.2)
CO2: 21 mmol/L (ref 20–29)
Calcium: 11.5 mg/dL — ABNORMAL HIGH (ref 8.6–10.2)
Chloride: 102 mmol/L (ref 96–106)
Creatinine, Ser: 1.38 mg/dL — ABNORMAL HIGH (ref 0.76–1.27)
Globulin, Total: 3 g/dL (ref 1.5–4.5)
Glucose: 108 mg/dL — ABNORMAL HIGH (ref 70–99)
Potassium: 4.4 mmol/L (ref 3.5–5.2)
Sodium: 137 mmol/L (ref 134–144)
Total Protein: 7.4 g/dL (ref 6.0–8.5)
eGFR: 55 mL/min/{1.73_m2} — ABNORMAL LOW (ref >59)

## 2023-07-04 LAB — PTH, INTACT AND CALCIUM: PTH: 44 pg/mL (ref 15–65)

## 2023-07-04 LAB — HEMOGLOBIN A1C
Est. average glucose Bld gHb Est-mCnc: 174 mg/dL
Hgb A1c MFr Bld: 7.7 % — ABNORMAL HIGH (ref 4.8–5.6)

## 2023-07-04 LAB — MICROALBUMIN / CREATININE URINE RATIO
Creatinine, Urine: 79.5 mg/dL
Microalb/Creat Ratio: 395 mg/g{creat} — ABNORMAL HIGH (ref 0–29)
Microalbumin, Urine: 313.8 ug/mL

## 2023-07-06 ENCOUNTER — Ambulatory Visit: Admitting: Nurse Practitioner

## 2023-07-06 ENCOUNTER — Ambulatory Visit: Attending: Critical Care Medicine

## 2023-07-07 LAB — THYROID PANEL WITH TSH
Free Thyroxine Index: 4.5 (ref 1.2–4.9)
T3 Uptake Ratio: 48 % — ABNORMAL HIGH (ref 24–39)
T4, Total: 9.3 ug/dL (ref 4.5–12.0)
TSH: 2.82 u[IU]/mL (ref 0.450–4.500)

## 2023-07-07 LAB — PSA: Prostate Specific Ag, Serum: 1.2 ng/mL (ref 0.0–4.0)

## 2023-07-08 ENCOUNTER — Encounter: Payer: Self-pay | Admitting: Nurse Practitioner

## 2023-07-09 ENCOUNTER — Encounter: Payer: Self-pay | Admitting: Nurse Practitioner

## 2023-07-17 ENCOUNTER — Ambulatory Visit: Payer: No Typology Code available for payment source | Attending: Critical Care Medicine

## 2023-07-24 ENCOUNTER — Other Ambulatory Visit: Payer: Self-pay

## 2023-07-31 ENCOUNTER — Other Ambulatory Visit: Payer: Self-pay

## 2023-08-17 ENCOUNTER — Telehealth: Payer: Self-pay

## 2023-08-17 NOTE — Telephone Encounter (Signed)
 Return call to sleep center unanswered.

## 2023-08-17 NOTE — Telephone Encounter (Signed)
 Copied from CRM [id number]. Topic: Medical Record Request - Provider/Facility Request >> Aug 17, 2023 11:29 AM Ivette P wrote: Reason for CRM: Terri called in to report that for the referral for the sleep study it is requesting a Prior Authorization and because pt has [employer] they require the referring office to do the PA, meaning we would have to submit the PA and follow up with [facility] for approval or denial.   Callback [phone]  Fax -  [phone]

## 2023-08-20 NOTE — Telephone Encounter (Signed)
 P.A has been started and clinical notes have been faxed. Advised that decision will take 13 business days to complete.

## 2023-08-28 ENCOUNTER — Other Ambulatory Visit: Payer: Self-pay

## 2023-09-07 ENCOUNTER — Other Ambulatory Visit: Payer: Self-pay

## 2023-09-10 ENCOUNTER — Other Ambulatory Visit: Payer: Self-pay

## 2023-10-01 ENCOUNTER — Ambulatory Visit: Admitting: Nurse Practitioner

## 2023-10-09 ENCOUNTER — Other Ambulatory Visit: Payer: Self-pay | Admitting: Nurse Practitioner

## 2023-10-09 ENCOUNTER — Other Ambulatory Visit: Payer: Self-pay

## 2023-10-09 ENCOUNTER — Other Ambulatory Visit: Payer: Self-pay | Admitting: Pharmacist

## 2023-10-09 DIAGNOSIS — I5022 Chronic systolic (congestive) heart failure: Secondary | ICD-10-CM

## 2023-10-09 MED ORDER — FUROSEMIDE 40 MG PO TABS
40.0000 mg | ORAL_TABLET | Freq: Every day | ORAL | 0 refills | Status: DC
  Filled 2023-10-09: qty 30, 30d supply, fill #0

## 2023-10-16 ENCOUNTER — Other Ambulatory Visit: Payer: Self-pay

## 2023-10-18 ENCOUNTER — Other Ambulatory Visit: Payer: Self-pay

## 2023-10-23 ENCOUNTER — Other Ambulatory Visit: Payer: Self-pay

## 2023-10-23 DIAGNOSIS — E119 Type 2 diabetes mellitus without complications: Secondary | ICD-10-CM | POA: Diagnosis not present

## 2023-10-23 DIAGNOSIS — Z0100 Encounter for examination of eyes and vision without abnormal findings: Secondary | ICD-10-CM | POA: Diagnosis not present

## 2023-10-29 ENCOUNTER — Other Ambulatory Visit: Payer: Self-pay

## 2023-11-06 ENCOUNTER — Telehealth: Payer: Self-pay | Admitting: Nurse Practitioner

## 2023-11-06 NOTE — Telephone Encounter (Signed)
 Pt unconfirmed appt no vm to leave a message

## 2023-11-07 ENCOUNTER — Other Ambulatory Visit: Payer: Self-pay

## 2023-11-07 ENCOUNTER — Encounter: Payer: Self-pay | Admitting: Nurse Practitioner

## 2023-11-07 ENCOUNTER — Ambulatory Visit: Attending: Nurse Practitioner | Admitting: Nurse Practitioner

## 2023-11-07 VITALS — BP 130/76 | HR 58 | Resp 19 | Ht 74.0 in | Wt 350.2 lb

## 2023-11-07 DIAGNOSIS — I5022 Chronic systolic (congestive) heart failure: Secondary | ICD-10-CM

## 2023-11-07 DIAGNOSIS — Z87898 Personal history of other specified conditions: Secondary | ICD-10-CM | POA: Diagnosis not present

## 2023-11-07 DIAGNOSIS — I639 Cerebral infarction, unspecified: Secondary | ICD-10-CM | POA: Diagnosis not present

## 2023-11-07 DIAGNOSIS — E782 Mixed hyperlipidemia: Secondary | ICD-10-CM | POA: Diagnosis not present

## 2023-11-07 DIAGNOSIS — Z7984 Long term (current) use of oral hypoglycemic drugs: Secondary | ICD-10-CM | POA: Diagnosis not present

## 2023-11-07 DIAGNOSIS — I10 Essential (primary) hypertension: Secondary | ICD-10-CM | POA: Diagnosis not present

## 2023-11-07 DIAGNOSIS — E119 Type 2 diabetes mellitus without complications: Secondary | ICD-10-CM | POA: Diagnosis not present

## 2023-11-07 LAB — POCT GLYCOSYLATED HEMOGLOBIN (HGB A1C): Hemoglobin A1C: 7.5 % — AB (ref 4.0–5.6)

## 2023-11-07 MED ORDER — ATORVASTATIN CALCIUM 20 MG PO TABS
20.0000 mg | ORAL_TABLET | Freq: Every day | ORAL | 1 refills | Status: AC
  Filled 2023-11-07: qty 90, 90d supply, fill #0
  Filled 2023-11-27: qty 30, 30d supply, fill #0
  Filled 2023-12-24: qty 30, 30d supply, fill #1
  Filled 2024-01-18: qty 60, 60d supply, fill #2
  Filled 2024-03-20 (×2): qty 60, 60d supply, fill #3

## 2023-11-07 MED ORDER — ASPIRIN 81 MG PO TBEC
81.0000 mg | DELAYED_RELEASE_TABLET | Freq: Every day | ORAL | 3 refills | Status: AC
  Filled 2023-11-07 – 2024-01-18 (×2): qty 120, 120d supply, fill #0

## 2023-11-07 MED ORDER — LOSARTAN POTASSIUM 100 MG PO TABS
100.0000 mg | ORAL_TABLET | Freq: Every day | ORAL | 1 refills | Status: AC
  Filled 2023-11-07 – 2023-12-24 (×2): qty 90, 90d supply, fill #0
  Filled 2024-03-20: qty 90, 90d supply, fill #1

## 2023-11-07 MED ORDER — AMLODIPINE BESYLATE 10 MG PO TABS
10.0000 mg | ORAL_TABLET | Freq: Every day | ORAL | 1 refills | Status: AC
  Filled 2023-11-07 – 2023-11-27 (×3): qty 90, 90d supply, fill #0
  Filled 2024-02-18: qty 90, 90d supply, fill #1

## 2023-11-07 MED ORDER — FAMOTIDINE 20 MG PO TABS
20.0000 mg | ORAL_TABLET | Freq: Every day | ORAL | 1 refills | Status: AC
  Filled 2023-11-07: qty 90, 90d supply, fill #0
  Filled 2023-11-27: qty 30, 30d supply, fill #0
  Filled 2023-12-24: qty 30, 30d supply, fill #1
  Filled 2024-01-18: qty 60, 60d supply, fill #2
  Filled 2024-03-20 (×2): qty 60, 60d supply, fill #3

## 2023-11-07 NOTE — Progress Notes (Unsigned)
 Assessment & Plan:  Austin Mercer was seen today for diabetes and hypertension.  Diagnoses and all orders for this visit:  Diabetes mellitus treated with oral medication (HCC) -     POCT glycosylated hemoglobin (Hb A1C) A1c is 7.5, improved but above target of <7. - Encourage regular blood glucose monitoring.  Primary hypertension -     amLODipine  (NORVASC ) 10 MG tablet; Take 1 tablet (10 mg total) by mouth daily. -     losartan  (COZAAR ) 100 MG tablet; Take 1 tablet (100 mg total) by mouth daily. Continue all antihypertensives as prescribed.  Reminded to bring in blood pressure log for follow  up appointment.  RECOMMENDATIONS: DASH/Mediterranean Diets are healthier choices for HTN.    Cerebrovascular accident (CVA), unspecified mechanism (HCC) -     aspirin  EC 81 MG tablet; Take 1 tablet (81 mg total) by mouth daily. Swallow whole. -     Ambulatory referral to Cardiology -     Ambulatory referral to Neurology Phone numbers to both offices were provided to him today and printed on AVS  Mixed hyperlipidemia -     atorvastatin  (LIPITOR) 20 MG tablet; Take 1 tablet (20 mg total) by mouth daily.  History of alcohol use disorder -     famotidine  (PEPCID ) 20 MG tablet; Take 1 tablet (20 mg total) by mouth daily.  Chronic systolic heart failure (HCC) -     Ambulatory referral to Cardiology Heart murmur present, needs further evaluation. - Provide phone number for cardiologist and instruct him to schedule an appointment.  Disorder of calcium  metabolism Referred to endocrinologist, appointment needs confirmation. - Call endocrinologist to confirm and schedule follow-up appointment.   Patient has been counseled on age-appropriate routine health concerns for screening and prevention. These are reviewed and up-to-date. Referrals have been placed accordingly. Immunizations are up-to-date or declined.    Subjective:   Chief Complaint  Patient presents with   Diabetes   Hypertension     History of Present Illness Austin Amare Bail. is a 72 year old male with diabetes and hypertension who presents for follow-up on his A1c levels.  DM 2 His A1c has decreased slightly to 7.5, but it remains above the target of under 7. He checks his blood sugars inconsistently. He is not taking farxiga  as prescribed. He is also prescribed metformin .  Lab Results  Component Value Date   HGBA1C 7.5 (A) 11/07/2023     HTN He is currently on blood pressure medication and cholesterol medication, both of which are running out after this month. He previously took tramadol  in the hospital. He takes omeprazole and feels the current dose is ineffective.  BP Readings from Last 3 Encounters:  11/07/23 130/76  07/02/23 (!) 155/77  09/20/22 138/86     He has a history of congestive heart failure and is supposed to see a cardiologist annually, but he has not seen one since March of last year. He does not have an upcoming appointment scheduled.  He has a history of stroke and is overdue for Neurology follow up. He mentions a change in doctors and expresses dissatisfaction with his current care.  He was referred to an endocrinologist for his calcium  levels, but he has not been contacted for an appointment. He sometimes checks his phone messages but is not consistent.  He is unsure if he is taking aspirin . He mentions taking multiple medications and is unfamiliar with famotidine , which is listed for acid reflux.  Review of Systems  Constitutional:  Negative for  fever, malaise/fatigue and weight loss.  HENT: Negative.  Negative for nosebleeds.   Eyes: Negative.  Negative for blurred vision, double vision and photophobia.  Respiratory: Negative.  Negative for cough and shortness of breath.   Cardiovascular: Negative.  Negative for chest pain, palpitations and leg swelling.  Gastrointestinal: Negative.  Negative for heartburn, nausea and vomiting.  Musculoskeletal: Negative.  Negative for myalgias.   Neurological: Negative.  Negative for dizziness, focal weakness, seizures and headaches.  Psychiatric/Behavioral: Negative.  Negative for suicidal ideas.     Past Medical History:  Diagnosis Date   Diabetes (HCC)    Hypertension    Hypertensive urgency 03/04/2020   Stroke [facility])     Past Surgical History:  Procedure Laterality Date   ANKLE FRACTURE SURGERY      Family History  Problem Relation Age of Onset   Diabetes Father     Social History Reviewed with no changes to be made today.   Outpatient Medications Prior to Visit  Medication Sig Dispense Refill   furosemide  (LASIX ) 40 MG tablet Take 1 tablet (40 mg total) by mouth daily. 30 tablet 0   amLODipine  (NORVASC ) 10 MG tablet Take 1 tablet (10 mg total) by mouth daily. 90 tablet 1   atorvastatin  (LIPITOR) 20 MG tablet Take 1 tablet (20 mg total) by mouth daily. 90 tablet 1   famotidine  (PEPCID ) 20 MG tablet Take 1 tablet (20 mg total) by mouth daily. 90 tablet 1   losartan  (COZAAR ) 100 MG tablet Take 1 tablet (100 mg total) by mouth daily. 90 tablet 1   Accu-Chek Softclix Lancets lancets Use to check blood sugar twice daily (Patient not taking: Reported on 11/07/2023) 100 each 6   b complex vitamins capsule Take 1 capsule by mouth daily. 90 capsule 3   Blood Glucose Monitoring Suppl (ACCU-CHEK GUIDE) w/Device KIT Use to check blood sugar twice daily (Patient not taking: Reported on 11/07/2023) 1 kit 0   Blood Pressure Monitoring (BLOOD PRESSURE KIT) DEVI Use to measure blood pressure (Patient not taking: Reported on 11/07/2023) 1 each 0   carvedilol  (COREG ) 25 MG tablet Take 1 tablet (25 mg total) by mouth 2 (two) times daily with a meal. 180 tablet 1   dapagliflozin  propanediol (FARXIGA ) 10 MG TABS tablet Take 1 tablet (10 mg total) by mouth daily before breakfast. 90 tablet 1   glucose blood (ACCU-CHEK GUIDE) test strip Use to check blood sugar twice daily 100 each 6   metFORMIN  (GLUCOPHAGE ) 500 MG tablet Take 1 tablet (500 mg  total) by mouth 2 (two) times daily with a meal. 180 tablet 1   potassium chloride  (KLOR-CON ) 8 MEQ tablet Take 2 tablets (16 mEq total) by mouth 2 (two) times daily. 180 tablet 1   spironolactone  (ALDACTONE ) 25 MG tablet Take 1 tablet (25 mg total) by mouth daily. 90 tablet 1   thiamine  (VITAMIN B-1) 100 MG tablet Take 1 tablet (100 mg total) by mouth daily. 90 tablet 1   traMADol  (ULTRAM ) 50 MG tablet Take 1 tablet (50 mg total) by mouth every 12 (twelve) hours as needed for severe pain. (Patient not taking: Reported on 11/07/2023) 20 tablet 0   aspirin  EC 81 MG tablet Take 1 tablet (81 mg total) by mouth daily. Swallow whole. (Patient not taking: Reported on 11/07/2023) 90 tablet 3   No facility-administered medications prior to visit.    Allergies  Allergen Reactions   Codeine Itching   Latex Itching       Objective:  BP 130/76 (BP Location: Left Arm, Patient Position: Sitting, Cuff Size: Large)   Pulse (!) 58   Resp 19   Ht 6' 2 (1.88 m)   Wt (!) 350 lb 3.2 oz (158.8 kg)   SpO2 98%   BMI 44.96 kg/m  Wt Readings from Last 3 Encounters:  11/07/23 (!) 350 lb 3.2 oz (158.8 kg)  07/02/23 (!) 354 lb 12.8 oz (160.9 kg)  09/20/22 (!) 342 lb 9.6 oz (155.4 kg)    Physical Exam Vitals and nursing note reviewed.  Constitutional:      Appearance: He is well-developed.  HENT:     Head: Normocephalic and atraumatic.  Cardiovascular:     Rate and Rhythm: Regular rhythm. Bradycardia present.     Heart sounds: Murmur heard.     No friction rub. No gallop.  Pulmonary:     Effort: Pulmonary effort is normal. No tachypnea or respiratory distress.     Breath sounds: Normal breath sounds. No decreased breath sounds, wheezing, rhonchi or rales.  Chest:     Chest wall: No tenderness.  Abdominal:     General: Bowel sounds are normal.     Palpations: Abdomen is soft.  Musculoskeletal:        General: Normal range of motion.     Cervical back: Normal range of motion.  Skin:    General:  Skin is warm and dry.  Neurological:     Mental Status: He is alert and oriented to person, place, and time.     Coordination: Coordination normal.  Psychiatric:        Behavior: Behavior normal. Behavior is cooperative.        Thought Content: Thought content normal.        Judgment: Judgment normal.          Patient has been counseled extensively about nutrition and exercise as well as the importance of adherence with medications and regular follow-up. The patient was given clear instructions to go to ER or return to medical center if symptoms don't improve, worsen or new problems develop. The patient verbalized understanding.   Follow-up: No follow-ups on file.   Haze LELON Servant, FNP-BC [facility] and [facility] [place], [place] [phone]   11/07/2023, 10:51 AM

## 2023-11-07 NOTE — Patient Instructions (Addendum)
 [facility]in[facility]  Ph# [phone] Fax [phone] address [facility] [address] [place], [place] [address].  11am 11-07-2023  HEART DOCTOR Soyla Norton, MD Address: [address], [place], [place] [address] Phone: [phone]    Neurology [facility] Address: [address], [place], [place] [address] Phone: [phone]

## 2023-11-09 ENCOUNTER — Encounter: Payer: Self-pay | Admitting: Family

## 2023-11-09 ENCOUNTER — Ambulatory Visit (INDEPENDENT_AMBULATORY_CARE_PROVIDER_SITE_OTHER): Admitting: Family

## 2023-11-09 DIAGNOSIS — M1711 Unilateral primary osteoarthritis, right knee: Secondary | ICD-10-CM | POA: Diagnosis not present

## 2023-11-09 MED ORDER — METHYLPREDNISOLONE ACETATE 40 MG/ML IJ SUSP
40.0000 mg | INTRAMUSCULAR | Status: AC | PRN
  Administered 2023-11-09: 40 mg via INTRA_ARTICULAR

## 2023-11-09 MED ORDER — LIDOCAINE HCL 1 % IJ SOLN
5.0000 mL | INTRAMUSCULAR | Status: AC | PRN
  Administered 2023-11-09: 5 mL

## 2023-11-09 NOTE — Progress Notes (Signed)
 Office Visit Note   Patient: Austin Mercer.           Date of Birth: 1950/09/27           MRN: [id number] Visit Date: 11/09/2023              Requested by: Theotis Haze ORN, NP [address] [place] [address] [place],  [place] [address] PCP: Theotis Haze ORN, NP  No chief complaint on file.     HPI: The patient is a 73 year old gentleman seen for planned Depo-Medrol  injection right knee he has a history of osteoarthritis medial and lateral knee pain denies mechanical symptoms  Has last had a Depo-Medrol  injection November of last year.  Assessment & Plan: Visit Diagnoses: No diagnosis found.  Plan: Depo-Medrol  injection right knee.  Patient tolerated well.  Follow-Up Instructions: No follow-ups on file.   Right Knee Exam   Muscle Strength  The patient has normal right knee strength.  Tenderness  The patient is experiencing tenderness in the medial joint line and lateral joint line.  Range of Motion  The patient has normal right knee ROM.  Tests  Varus: negative Valgus: negative  Other  Erythema: absent Effusion: no effusion present      Patient is alert, oriented, no adenopathy, well-dressed, normal affect, normal respiratory effort.     Imaging: No results found. No images are attached to the encounter.  Labs: Lab Results  Component Value Date   HGBA1C 7.5 (A) 11/07/2023   HGBA1C 7.7 (H) 07/02/2023   HGBA1C 6.2 (H) 05/19/2022     Lab Results  Component Value Date   ALBUMIN 4.4 07/02/2023   ALBUMIN 4.1 06/22/2022   ALBUMIN 3.9 05/16/2022    Lab Results  Component Value Date   MG 1.9 05/16/2022   MG 1.7 12/30/2011   Lab Results  Component Value Date   VD25OH 28.4 (L) 11/23/2017    No results found for: PREALBUMIN    Latest Ref Rng & Units 07/02/2023    3:49 PM 06/22/2022   11:08 AM 05/18/2022    6:26 AM  CBC EXTENDED  WBC 3.4 - 10.8 x10E3/uL 7.2  7.7  6.0   RBC 4.14 - 5.80 x10E6/uL 5.06  5.05  5.01   Hemoglobin 13.0 - 17.7 g/dL  86.0  87.0  86.6   HCT 37.5 - 51.0 % 42.1  39.2  40.3   Platelets 150 - 450 x10E3/uL 175  144  135   NEUT# 1.4 - 7.0 x10E3/uL 2.7  2.4    Lymph# 0.7 - 3.1 x10E3/uL 3.0  3.7       There is no height or weight on file to calculate BMI.  Orders:  No orders of the defined types were placed in this encounter.  No orders of the defined types were placed in this encounter.    Procedures: Large Joint Inj: R knee on 11/09/2023 9:25 AM Indications: pain Details: 18 G 1.5 in needle, anteromedial approach Medications: 5 mL lidocaine  1 %; 40 mg methylPREDNISolone  acetate 40 MG/ML Consent was given by the patient.      Clinical Data: No additional findings.  ROS:  All other systems negative, except as noted in the HPI. Review of Systems  Objective: Vital Signs: There were no vitals taken for this visit.  Specialty Comments:  No specialty comments available.  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   Positive for macroalbuminuria 01/27/2018   Hypercalcemia 01/24/2018   Controlled type 2 diabetes mellitus without complication, without long-term current use of insulin  (HCC) 11/23/2017   Essential hypertension 11/23/2017   Primary osteoarthritis of right knee 11/23/2017   Class 3 severe obesity due to excess calories with serious comorbidity and body mass index (BMI) of 45.0 to 49.9 in adult 11/23/2017    Age-related cataract of right eye 11/23/2017   Hepatitis C virus infection cured after antiviral drug therapy 11/23/2017   Chronic, continuous use of opioids 11/23/2017   Past Medical History:  Diagnosis Date   Diabetes (HCC)    Hypertension    Hypertensive urgency 03/04/2020   Stroke [facility])     Family History  Problem Relation Age of Onset   Diabetes Father     Past Surgical History:  Procedure Laterality Date   ANKLE FRACTURE SURGERY     Social History   Occupational History   Occupation: retired  Tobacco Use   Smoking status: Former    Current packs/day: 0.00    Types: Cigarettes    Quit date: 1973    Years since quitting: 52.6   Smokeless tobacco: Never  Vaping Use   Vaping status: Never Used  Substance and Sexual Activity   Alcohol use: Yes    Comment: Beer, Red wine; OCC   Drug use: Not Currently   Sexual activity: Not on file

## 2023-11-14 ENCOUNTER — Other Ambulatory Visit: Payer: Self-pay

## 2023-11-27 ENCOUNTER — Other Ambulatory Visit: Payer: Self-pay | Admitting: Pharmacist

## 2023-11-27 ENCOUNTER — Other Ambulatory Visit: Payer: Self-pay | Admitting: Nurse Practitioner

## 2023-11-27 ENCOUNTER — Other Ambulatory Visit: Payer: Self-pay

## 2023-11-27 ENCOUNTER — Other Ambulatory Visit: Payer: Self-pay | Admitting: Family Medicine

## 2023-11-27 DIAGNOSIS — I5022 Chronic systolic (congestive) heart failure: Secondary | ICD-10-CM

## 2023-11-27 DIAGNOSIS — I10 Essential (primary) hypertension: Secondary | ICD-10-CM

## 2023-11-27 MED ORDER — FUROSEMIDE 40 MG PO TABS
40.0000 mg | ORAL_TABLET | Freq: Every day | ORAL | 1 refills | Status: AC
Start: 2023-11-27 — End: ?
  Filled 2023-11-27: qty 30, 30d supply, fill #0
  Filled 2023-12-24: qty 90, 90d supply, fill #1
  Filled 2024-03-20 (×2): qty 60, 60d supply, fill #2

## 2023-11-27 MED ORDER — POTASSIUM CHLORIDE ER 8 MEQ PO TBCR
16.0000 meq | EXTENDED_RELEASE_TABLET | Freq: Two times a day (BID) | ORAL | 1 refills | Status: DC
Start: 2023-11-27 — End: 2024-02-18
  Filled 2023-11-27: qty 120, 30d supply, fill #0
  Filled 2023-12-24: qty 120, 30d supply, fill #1
  Filled 2024-01-18: qty 120, 30d supply, fill #2

## 2023-11-28 ENCOUNTER — Other Ambulatory Visit (HOSPITAL_COMMUNITY): Payer: Self-pay

## 2023-12-24 ENCOUNTER — Other Ambulatory Visit: Payer: Self-pay

## 2023-12-31 ENCOUNTER — Other Ambulatory Visit: Payer: Self-pay

## 2024-01-11 ENCOUNTER — Telehealth: Payer: Self-pay | Admitting: Nurse Practitioner

## 2024-01-11 NOTE — Telephone Encounter (Signed)
 Called pt to reschedule appt. Pts phone is unavailable. Please reschedule if pt call back

## 2024-01-16 ENCOUNTER — Other Ambulatory Visit: Payer: Self-pay | Admitting: Pharmacist

## 2024-01-16 NOTE — Progress Notes (Signed)
 Pharmacy Quality Measure Review  This patient is appearing on a report for being at risk of failing the adherence measure for cholesterol (statin) medications this calendar year.   Medication: atorvastatin  Last fill date: 12/24/2023 for 30 day supply  Insurance report was not up to date. No action needed at this time. Reminder set for next fill.   Herlene Fleeta Morris, PharmD, JAQUELINE, CPP Clinical Pharmacist [facility] [phone]

## 2024-01-18 ENCOUNTER — Other Ambulatory Visit: Payer: Self-pay

## 2024-01-23 ENCOUNTER — Other Ambulatory Visit: Payer: Self-pay | Admitting: Pharmacist

## 2024-01-23 NOTE — Progress Notes (Signed)
 Pharmacy Quality Measure Review  This patient is appearing on a report for being at risk of failing the adherence measure for cholesterol (statin) medications this calendar year.   Medication: atorvastatin  Last fill date:  01/18/2024 for 60 day supply  Insurance report was not up to date. No action needed at this time. Reminder set for next fill.   Herlene Fleeta Morris, PharmD, JAQUELINE, CPP Clinical Pharmacist [facility] [phone]

## 2024-02-04 ENCOUNTER — Encounter: Payer: Self-pay | Admitting: Radiology

## 2024-02-07 ENCOUNTER — Other Ambulatory Visit: Payer: Self-pay

## 2024-02-08 ENCOUNTER — Ambulatory Visit: Admitting: Nurse Practitioner

## 2024-02-18 ENCOUNTER — Other Ambulatory Visit: Payer: Self-pay | Admitting: Family Medicine

## 2024-02-18 ENCOUNTER — Other Ambulatory Visit: Payer: Self-pay | Admitting: Nurse Practitioner

## 2024-02-18 ENCOUNTER — Other Ambulatory Visit: Payer: Self-pay

## 2024-02-18 DIAGNOSIS — I5022 Chronic systolic (congestive) heart failure: Secondary | ICD-10-CM

## 2024-02-18 DIAGNOSIS — E119 Type 2 diabetes mellitus without complications: Secondary | ICD-10-CM

## 2024-02-18 DIAGNOSIS — I10 Essential (primary) hypertension: Secondary | ICD-10-CM

## 2024-02-18 MED ORDER — DAPAGLIFLOZIN PROPANEDIOL 10 MG PO TABS
10.0000 mg | ORAL_TABLET | Freq: Every day | ORAL | 0 refills | Status: AC
  Filled 2024-02-18 – 2024-03-20 (×3): qty 90, 90d supply, fill #0

## 2024-02-18 MED ORDER — POTASSIUM CHLORIDE ER 8 MEQ PO TBCR
16.0000 meq | EXTENDED_RELEASE_TABLET | Freq: Two times a day (BID) | ORAL | 0 refills | Status: AC
  Filled 2024-02-18 – 2024-03-20 (×2): qty 360, 90d supply, fill #0
  Filled 2024-03-20: qty 120, 30d supply, fill #0

## 2024-03-10 ENCOUNTER — Ambulatory Visit: Admitting: Physician Assistant

## 2024-03-10 DIAGNOSIS — M1711 Unilateral primary osteoarthritis, right knee: Secondary | ICD-10-CM

## 2024-03-10 MED ORDER — BUPIVACAINE HCL 0.25 % IJ SOLN
2.0000 mL | INTRAMUSCULAR | Status: AC | PRN
  Administered 2024-03-10: 2 mL via INTRA_ARTICULAR

## 2024-03-10 MED ORDER — METHYLPREDNISOLONE ACETATE 40 MG/ML IJ SUSP
40.0000 mg | INTRAMUSCULAR | Status: AC | PRN
  Administered 2024-03-10: 40 mg via INTRA_ARTICULAR

## 2024-03-10 MED ORDER — LIDOCAINE HCL 1 % IJ SOLN
2.0000 mL | INTRAMUSCULAR | Status: AC | PRN
  Administered 2024-03-10: 2 mL

## 2024-03-10 NOTE — Progress Notes (Signed)
 Office Visit Note   Patient: Austin Kamath Jr.           Date of Birth: Sep 07, 1950           MRN: [id number] Visit Date: 03/10/2024              Requested by: Theotis Haze ORN, NP [address] [place] [address] [place],  [place] [address] PCP: Theotis Haze ORN, NP  Chief Complaint  Patient presents with  . Right Knee - Pain    Injection in right knee       HPI: Patient is a pleasant 73 year old periodically comes in for steroid injections into his right knee no new injury  Assessment & Plan: Visit Diagnoses:  1. Primary osteoarthritis of right knee     Plan:  Steroid injection completed without difficulty.  Follow-up as needed Follow-Up Instructions: Return if symptoms worsen or fail to improve.   Ortho Exam  Patient is alert, oriented, no adenopathy, well-dressed, normal affect, normal respiratory effort. Right knee no effusion no erythema compartments are soft and compressible    Imaging: No results found. No images are attached to the encounter.  Labs: Lab Results  Component Value Date   HGBA1C 7.5 (A) 11/07/2023   HGBA1C 7.7 (H) 07/02/2023   HGBA1C 6.2 (H) 05/19/2022     Lab Results  Component Value Date   ALBUMIN 4.4 07/02/2023   ALBUMIN 4.1 06/22/2022   ALBUMIN 3.9 05/16/2022    Lab Results  Component Value Date   MG 1.9 05/16/2022   MG 1.7 12/30/2011   Lab Results  Component Value Date   VD25OH 28.4 (L) 11/23/2017    No results found for: PREALBUMIN    Latest Ref Rng & Units 07/02/2023    3:49 PM 06/22/2022   11:08 AM 05/18/2022    6:26 AM  CBC EXTENDED  WBC 3.4 - 10.8 x10E3/uL 7.2  7.7  6.0   RBC 4.14 - 5.80 x10E6/uL 5.06  5.05  5.01   Hemoglobin 13.0 - 17.7 g/dL 86.0  87.0  86.6   HCT 37.5 - 51.0 % 42.1  39.2  40.3   Platelets 150 - 450 x10E3/uL 175  144  135   NEUT# 1.4 - 7.0 x10E3/uL 2.7  2.4    Lymph# 0.7 - 3.1 x10E3/uL 3.0  3.7       There is no height or weight on file to calculate BMI.  Orders:  No orders of the defined  types were placed in this encounter.  No orders of the defined types were placed in this encounter.    Procedures: Large Joint Inj: R knee on 03/10/2024 9:28 AM Indications: pain and diagnostic evaluation Details: 25 G 1.5 in needle, anteromedial approach  Arthrogram: No  Medications: 40 mg methylPREDNISolone  acetate 40 MG/ML; 2 mL lidocaine  1 %; 2 mL bupivacaine  0.25 % Outcome: tolerated well, no immediate complications Procedure, treatment alternatives, risks and benefits explained, specific risks discussed. Consent was given by the patient.      Clinical Data: No additional findings.  ROS:  All other systems negative, except as noted in the HPI. Review of Systems  Objective: Vital Signs: There were no vitals taken for this visit.  Specialty Comments:  No specialty comments available.  PMFS History: Patient Active Problem List   Diagnosis Date Noted  . History of stroke 05/11/2022  . Microalbuminuria due to type 2 diabetes mellitus (HCC) 10/21/2021  . Allergic rhinitis 10/18/2021  . Hypophosphatemia 09/14/2021  . Liver cirrhosis (HCC)  09/12/2021  . Moderate episode of recurrent major depressive disorder (HCC) 09/09/2020  . Toenail fungus 04/21/2020  . Chronic systolic heart failure (HCC) 03/11/2020  . First degree AV block 03/04/2020  . PVC (premature ventricular contraction) 03/04/2020  . Depression 03/04/2020  . History of alcohol use disorder 04/26/2018  . 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 Positive for macroalbuminuria 01/27/2018  . Hypercalcemia 01/24/2018  . Controlled type 2 diabetes mellitus without complication, without long-term current use of insulin  (HCC) 11/23/2017  . Essential hypertension 11/23/2017  . Primary osteoarthritis of right knee 11/23/2017  . Class 3 severe obesity due to excess calories with serious comorbidity and body mass index (BMI) of 45.0 to 49.9 in adult (HCC) 11/23/2017  . Age-related cataract of right eye  11/23/2017  . Hepatitis C virus infection cured after antiviral drug therapy 11/23/2017  . Chronic, continuous use of opioids 11/23/2017   Past Medical History:  Diagnosis Date  . Diabetes (HCC)   . Hypertension   . Hypertensive urgency 03/04/2020  . Stroke [facility])     Family History  Problem Relation Age of Onset  . Diabetes Father     Past Surgical History:  Procedure Laterality Date  . ANKLE FRACTURE SURGERY     Social History   Occupational History  . Occupation: retired  Tobacco Use  . Smoking status: Former    Current packs/day: 0.00    Types: Cigarettes    Quit date: 1973    Years since quitting: 52.9  . Smokeless tobacco: Never  Vaping Use  . Vaping status: Never Used  Substance and Sexual Activity  . Alcohol use: Yes    Comment: Beer, Red wine; OCC  . Drug use: Not Currently  . Sexual activity: Not on file

## 2024-03-20 ENCOUNTER — Other Ambulatory Visit: Payer: Self-pay

## 2024-03-20 ENCOUNTER — Other Ambulatory Visit: Payer: Self-pay | Admitting: Nurse Practitioner

## 2024-03-20 DIAGNOSIS — I5022 Chronic systolic (congestive) heart failure: Secondary | ICD-10-CM

## 2024-03-20 DIAGNOSIS — I10 Essential (primary) hypertension: Secondary | ICD-10-CM

## 2024-03-20 MED ORDER — CARVEDILOL 25 MG PO TABS
25.0000 mg | ORAL_TABLET | Freq: Two times a day (BID) | ORAL | 1 refills | Status: AC
  Filled 2024-03-20: qty 180, 90d supply, fill #0

## 2024-03-20 MED ORDER — SPIRONOLACTONE 25 MG PO TABS
25.0000 mg | ORAL_TABLET | Freq: Every day | ORAL | 1 refills | Status: AC
  Filled 2024-03-20: qty 90, 90d supply, fill #0

## 2024-04-18 ENCOUNTER — Telehealth: Payer: Self-pay | Admitting: Nurse Practitioner

## 2024-04-18 NOTE — Telephone Encounter (Addendum)
 Attempted to contact patient, no answer. Call could not be completed as dialed.  Unable to leave voicemail.

## 2024-04-18 NOTE — Telephone Encounter (Signed)
 Copied from CRM [id number]. Topic: Appointments - Scheduling Inquiry for Clinic >> Apr 18, 2024 11:36 AM   Myrick T wrote:  Reason for CRM: Austin Mercer called to schedule an appt for the flu shot. Please call [name] to schedule the appt

## 2024-04-24 ENCOUNTER — Other Ambulatory Visit: Payer: Self-pay

## 2024-05-28 ENCOUNTER — Ambulatory Visit: Payer: Self-pay | Admitting: Nurse Practitioner
